# Patient Record
Sex: Female | Born: 1955
Health system: Southern US, Community
[De-identification: ages and names within clinical notes are randomized; demographics above are authoritative.]

## PROBLEM LIST (undated history)

## (undated) DIAGNOSIS — E785 Hyperlipidemia, unspecified: Secondary | ICD-10-CM

## (undated) DIAGNOSIS — J449 Chronic obstructive pulmonary disease, unspecified: Secondary | ICD-10-CM

## (undated) DIAGNOSIS — K2100 Gastro-esophageal reflux disease with esophagitis, without bleeding: Secondary | ICD-10-CM

## (undated) DIAGNOSIS — J45909 Unspecified asthma, uncomplicated: Secondary | ICD-10-CM

## (undated) DIAGNOSIS — M1990 Unspecified osteoarthritis, unspecified site: Secondary | ICD-10-CM

## (undated) DIAGNOSIS — R05 Cough: Secondary | ICD-10-CM

## (undated) DIAGNOSIS — F32A Depression, unspecified: Secondary | ICD-10-CM

## (undated) DIAGNOSIS — R059 Cough, unspecified: Secondary | ICD-10-CM

## (undated) DIAGNOSIS — M791 Myalgia, unspecified site: Secondary | ICD-10-CM

## (undated) DIAGNOSIS — K589 Irritable bowel syndrome without diarrhea: Secondary | ICD-10-CM

## (undated) DIAGNOSIS — F419 Anxiety disorder, unspecified: Secondary | ICD-10-CM

## (undated) DIAGNOSIS — E119 Type 2 diabetes mellitus without complications: Secondary | ICD-10-CM

## (undated) DIAGNOSIS — B009 Herpesviral infection, unspecified: Secondary | ICD-10-CM

## (undated) DIAGNOSIS — K219 Gastro-esophageal reflux disease without esophagitis: Secondary | ICD-10-CM

## (undated) DIAGNOSIS — J349 Unspecified disorder of nose and nasal sinuses: Secondary | ICD-10-CM

## (undated) DIAGNOSIS — G4730 Sleep apnea, unspecified: Secondary | ICD-10-CM

## (undated) DIAGNOSIS — E039 Hypothyroidism, unspecified: Secondary | ICD-10-CM

## (undated) DIAGNOSIS — E079 Disorder of thyroid, unspecified: Secondary | ICD-10-CM

## (undated) DIAGNOSIS — M545 Low back pain, unspecified: Secondary | ICD-10-CM

## (undated) DIAGNOSIS — J42 Unspecified chronic bronchitis: Secondary | ICD-10-CM

## (undated) DIAGNOSIS — F329 Major depressive disorder, single episode, unspecified: Secondary | ICD-10-CM

## (undated) HISTORY — PX: OOPHORECTOMY: SHX86

## (undated) HISTORY — DX: Irritable bowel syndrome, unspecified: K58.9

## (undated) HISTORY — DX: Gastro-esophageal reflux disease with esophagitis, without bleeding: K21.00

## (undated) HISTORY — DX: Gastro-esophageal reflux disease with esophagitis: K21.0

## (undated) HISTORY — DX: Low back pain: M54.5

## (undated) HISTORY — DX: Depression, unspecified: F32.A

## (undated) HISTORY — DX: Unspecified osteoarthritis, unspecified site: M19.90

## (undated) HISTORY — DX: Chronic obstructive pulmonary disease, unspecified: J44.9

## (undated) HISTORY — DX: Major depressive disorder, single episode, unspecified: F32.9

## (undated) HISTORY — DX: Cough, unspecified: R05.9

## (undated) HISTORY — DX: Low back pain, unspecified: M54.50

## (undated) HISTORY — PX: TONSILLECTOMY AND ADENOIDECTOMY: SUR1326

## (undated) HISTORY — DX: Hyperlipidemia, unspecified: E78.5

## (undated) HISTORY — DX: Disorder of thyroid, unspecified: E07.9

## (undated) HISTORY — DX: Unspecified disorder of nose and nasal sinuses: J34.9

## (undated) HISTORY — DX: Herpesviral infection, unspecified: B00.9

## (undated) HISTORY — PX: ABDOMINAL HYSTERECTOMY: SHX81

## (undated) HISTORY — DX: Myalgia, unspecified site: M79.10

## (undated) HISTORY — PX: CARPAL TUNNEL RELEASE: SHX101

## (undated) HISTORY — PX: SHOULDER ARTHROSCOPY W/ ROTATOR CUFF REPAIR: SHX2400

## (undated) HISTORY — DX: Anxiety disorder, unspecified: F41.9

## (undated) HISTORY — DX: Cough: R05

## (undated) HISTORY — PX: FRACTURE SURGERY: SHX138

## (undated) HISTORY — PX: COLON SURGERY: SHX602

---

## 2004-08-26 ENCOUNTER — Emergency Department: Payer: Self-pay | Admitting: Emergency Medicine

## 2008-05-23 ENCOUNTER — Ambulatory Visit: Payer: Self-pay | Admitting: General Practice

## 2009-05-29 ENCOUNTER — Ambulatory Visit: Payer: Self-pay | Admitting: General Practice

## 2010-02-21 ENCOUNTER — Ambulatory Visit: Payer: Self-pay | Admitting: Family Medicine

## 2010-05-22 ENCOUNTER — Ambulatory Visit: Payer: Self-pay | Admitting: General Practice

## 2010-09-09 ENCOUNTER — Ambulatory Visit: Payer: Self-pay | Admitting: Family Medicine

## 2010-11-23 ENCOUNTER — Ambulatory Visit: Payer: Self-pay

## 2010-12-31 ENCOUNTER — Ambulatory Visit: Payer: Self-pay | Admitting: Unknown Physician Specialty

## 2011-01-09 ENCOUNTER — Ambulatory Visit: Payer: Self-pay | Admitting: Unknown Physician Specialty

## 2011-05-21 ENCOUNTER — Ambulatory Visit: Payer: Self-pay

## 2011-06-11 ENCOUNTER — Ambulatory Visit: Payer: Self-pay

## 2012-03-26 ENCOUNTER — Ambulatory Visit: Payer: Self-pay | Admitting: Family Medicine

## 2012-05-19 ENCOUNTER — Ambulatory Visit: Payer: Self-pay

## 2012-09-29 ENCOUNTER — Ambulatory Visit: Payer: Self-pay | Admitting: General Practice

## 2013-05-24 ENCOUNTER — Ambulatory Visit (INDEPENDENT_AMBULATORY_CARE_PROVIDER_SITE_OTHER): Payer: PRIVATE HEALTH INSURANCE | Admitting: Podiatry

## 2013-05-24 ENCOUNTER — Encounter: Payer: Self-pay | Admitting: Podiatry

## 2013-05-24 ENCOUNTER — Ambulatory Visit (INDEPENDENT_AMBULATORY_CARE_PROVIDER_SITE_OTHER): Payer: PRIVATE HEALTH INSURANCE

## 2013-05-24 VITALS — BP 127/72 | HR 68 | Resp 16 | Ht 60.0 in | Wt 170.0 lb

## 2013-05-24 DIAGNOSIS — M722 Plantar fascial fibromatosis: Secondary | ICD-10-CM

## 2013-05-24 DIAGNOSIS — M79671 Pain in right foot: Secondary | ICD-10-CM

## 2013-05-24 DIAGNOSIS — M79609 Pain in unspecified limb: Secondary | ICD-10-CM

## 2013-05-24 DIAGNOSIS — M7750 Other enthesopathy of unspecified foot: Secondary | ICD-10-CM

## 2013-05-24 MED ORDER — MELOXICAM 15 MG PO TABS: 15.0000 mg | ORAL_TABLET | Freq: Every day | ORAL | Status: DC

## 2013-05-24 MED ORDER — TRIAMCINOLONE ACETONIDE 10 MG/ML IJ SUSP
5.0000 mg | Freq: Once | INTRAMUSCULAR | Status: AC
  Administered 2013-05-24: 5 mg via INTRA_ARTICULAR

## 2013-05-24 NOTE — Progress Notes (Signed)
Subjective:     Patient ID: Sara Andrews, female   DOB: 1955-11-15, 57 y.o.   MRN: [id number]  HPI patient presents stating I have had bad heel pain for the last month on the right heel in my left foot has been hurting me on the outside the last several weeks. Had a history of this in the past but at least 8 year   Review of Systems  All other systems reviewed and are negative.       Objective:   Physical Exam  Nursing note and vitals reviewed. Constitutional: She is oriented to person, place, and time.  Cardiovascular: Intact distal pulses.   Musculoskeletal: Normal range of motion.  Neurological: She is oriented to person, place, and time.  Skin: Skin is warm.   patient is found to have intense discomfort plantar aspect right heel at the insertion of the calcaneus and the fascia and on the left lateral foot it is. Tender around the peroneal brevis insertion     Assessment:     Plantar fasciitis right and tendinitis of the left lateral foot    Plan:     H&P done along with x-rays. Injected the right plantar fascia 3 mg Kenalog 5 mg Xylocaine Marcaine and injected the left lateral foot 3 mg Kenalog 5 mg Xylocaine Marcaine dispensed fascial brace right placed on Mobic 15 mg daily and reappoint one week

## 2013-05-24 NOTE — Progress Notes (Signed)
N HURT L B/L/ FOOT RIGHT HEEL / LEFT LATERAL D RT 4 WEEKS/ LEFT 1 WEEK O SUDDEN C WORSE/BETTER A STANDING T IBUPROFEN, ICE PACKS ORTHOTICS

## 2013-05-26 ENCOUNTER — Ambulatory Visit: Payer: Self-pay | Admitting: General Practice

## 2013-05-31 ENCOUNTER — Ambulatory Visit (INDEPENDENT_AMBULATORY_CARE_PROVIDER_SITE_OTHER): Payer: PRIVATE HEALTH INSURANCE | Admitting: Podiatry

## 2013-05-31 ENCOUNTER — Encounter: Payer: Self-pay | Admitting: Podiatry

## 2013-05-31 VITALS — BP 122/57 | HR 76 | Resp 16 | Ht 60.0 in | Wt 170.0 lb

## 2013-05-31 DIAGNOSIS — M779 Enthesopathy, unspecified: Secondary | ICD-10-CM

## 2013-05-31 DIAGNOSIS — M722 Plantar fascial fibromatosis: Secondary | ICD-10-CM

## 2013-05-31 MED ORDER — TRIAMCINOLONE ACETONIDE 10 MG/ML IJ SUSP
5.0000 mg | Freq: Once | INTRAMUSCULAR | Status: AC
  Administered 2013-05-31: 5 mg via INTRA_ARTICULAR

## 2013-05-31 NOTE — Progress Notes (Signed)
Subjective:     Patient ID: Sara Andrews, female   DOB: September 28, 1955, 57 y.o.   MRN: [id number]  HPI patient states I'm doing better but my left ankle has been sore. States the heels are improving   Review of Systems     Objective:   Physical Exam  Nursing note and vitals reviewed. Constitutional: She is oriented to person, place, and time.  Cardiovascular: Intact distal pulses.   Musculoskeletal: Normal range of motion.  Neurological: She is oriented to person, place, and time.  Skin: Skin is warm.   patient has discomfort in the sinus tarsi left with inflammation noted. Heels are improved upon palpation    Assessment:     Improved plantar fasciitis with sinus tarsitis noted left    Plan:     Advised on supportive shoe gear and injected the sinus tarsi left 3 mg Kenalog 5 mg Xylocaine Marcaine mixture. Reappoint her recheck as needed

## 2013-09-13 ENCOUNTER — Ambulatory Visit: Payer: Self-pay | Admitting: Cardiovascular Disease

## 2013-09-20 ENCOUNTER — Ambulatory Visit: Payer: Self-pay | Admitting: Cardiovascular Disease

## 2013-09-30 ENCOUNTER — Encounter: Payer: Self-pay | Admitting: Podiatry

## 2013-09-30 ENCOUNTER — Ambulatory Visit (INDEPENDENT_AMBULATORY_CARE_PROVIDER_SITE_OTHER): Payer: PRIVATE HEALTH INSURANCE | Admitting: Podiatry

## 2013-09-30 VITALS — BP 120/70 | HR 74 | Resp 16

## 2013-09-30 DIAGNOSIS — M722 Plantar fascial fibromatosis: Secondary | ICD-10-CM

## 2013-09-30 MED ORDER — TRIAMCINOLONE ACETONIDE 10 MG/ML IJ SUSP
10.0000 mg | Freq: Once | INTRAMUSCULAR | Status: AC
  Administered 2013-09-30: 10 mg

## 2013-09-30 NOTE — Progress Notes (Signed)
The right foot is just getting worse .

## 2013-09-30 NOTE — Progress Notes (Signed)
Subjective:     Patient ID: Sara Andrews, female   DOB: 12-Dec-1955, 58 y.o.   MRN: [id number]  HPI patient states my heel has started to kill me again on my right on Friday and I'm having trouble going up in the morning and being able to walk on this   Review of Systems     Objective:   Physical Exam Neurovascular status is found to be intact with normal range of motion and muscle strength and no equinus condition noted of both feet. Patient is found to have intense discomfort plantar heel right at the insertional point of the tendon into the calcaneus    Assessment:     Severe plantar fasciitis right foot with inflammation at the tendon insertion    Plan:     Injected the right fascia 3 mg Kenalog 5 mg Xylocaine Marcaine mixture and dispensed night splint with all instructions on usage continue oral anti-inflammatories and reappoint in 2 weeks

## 2013-10-05 ENCOUNTER — Ambulatory Visit (INDEPENDENT_AMBULATORY_CARE_PROVIDER_SITE_OTHER): Payer: PRIVATE HEALTH INSURANCE | Admitting: Cardiovascular Disease

## 2013-10-05 ENCOUNTER — Encounter: Payer: Self-pay | Admitting: Cardiovascular Disease

## 2013-10-05 VITALS — BP 110/84 | HR 74 | Ht 60.0 in | Wt 169.5 lb

## 2013-10-05 DIAGNOSIS — R079 Chest pain, unspecified: Secondary | ICD-10-CM | POA: Insufficient documentation

## 2013-10-05 DIAGNOSIS — E038 Other specified hypothyroidism: Secondary | ICD-10-CM | POA: Insufficient documentation

## 2013-10-05 DIAGNOSIS — E785 Hyperlipidemia, unspecified: Secondary | ICD-10-CM | POA: Insufficient documentation

## 2013-10-05 DIAGNOSIS — Z87891 Personal history of nicotine dependence: Secondary | ICD-10-CM

## 2013-10-05 DIAGNOSIS — J449 Chronic obstructive pulmonary disease, unspecified: Secondary | ICD-10-CM | POA: Insufficient documentation

## 2013-10-05 DIAGNOSIS — J4489 Other specified chronic obstructive pulmonary disease: Secondary | ICD-10-CM | POA: Insufficient documentation

## 2013-10-05 DIAGNOSIS — E039 Hypothyroidism, unspecified: Secondary | ICD-10-CM

## 2013-10-05 DIAGNOSIS — R0602 Shortness of breath: Secondary | ICD-10-CM

## 2013-10-05 NOTE — Progress Notes (Signed)
Patient ID: Sara Andrews, female    DOB: 04-21-1956, 58 y.o.   MRN: [id number]  HPI Comments: [name] is a pleasant 58 year old woman with history of COPD, diabetes type 2, hyperlipidemia, long history of smoking with chronic shortness of breath who presents by referral for symptoms of chest pain.  She reports that over the past several months she has had rare episodes of chest pain. Symptoms presenting on the left side, sometimes at rest, sometimes with stress. Symptoms last for only a short period of time. No reproducible pain with palpation. 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   Her biggest complaint is her shortness of breath. She has had this for a long time. She quit smoking 5 years ago but smoked for 35 years. She has never had any cardiac workup in the past. She is limited in her ability to exert herself secondary to shortness of breath. She has noticed this when doing yard work, symptoms started in particular last summer 2014  Total cholesterol 211, LDL 142, hemoglobin A1c 6.2. Recently started on generic Lipitor  EKG shows normal sinus rhythm with rate 74 beats per minute with no significant ST or T wave changes     Outpatient Encounter Prescriptions as of 10/05/2013  Medication Sig  . aspirin EC 81 MG tablet Take 81 mg by mouth daily.  Marland Kitchen. atorvastatin (LIPITOR) 20 MG tablet Take 20 mg by mouth daily.  . diclofenac (VOLTAREN) 75 MG EC tablet Take 75 mg by mouth 2 (two) times daily.  Marland Kitchen. levothyroxine (SYNTHROID, LEVOTHROID) 88 MCG tablet Take 88 mcg by mouth daily before breakfast.  . omeprazole (PRILOSEC) 40 MG capsule Take 40 mg by mouth daily.  . sertraline (ZOLOFT) 100 MG tablet Take 100 mg by mouth daily.  Marland Kitchen. tiotropium (SPIRIVA) 18 MCG inhalation capsule Place 18 mcg into inhaler and inhale as needed.     Review of Systems  Constitutional: Negative.   HENT: Negative.   Eyes: Negative.   Respiratory: Positive  for chest tightness and shortness of breath.   Cardiovascular: Positive for chest pain.  Gastrointestinal: Negative.   Endocrine: Negative.   Musculoskeletal: Negative.   Skin: Negative.   Allergic/Immunologic: Negative.   Neurological: Negative.   Hematological: Negative.   Psychiatric/Behavioral: Negative.   All other systems reviewed and are negative.    BP 110/84  Pulse 74  Ht 5' (1.524 m)  Wt 169 lb 8 oz (76.885 kg)  BMI 33.10 kg/m2  Physical Exam  Nursing note and vitals reviewed. Constitutional: She is oriented to person, place, and time. She appears well-developed and well-nourished.  HENT:  Head: Normocephalic.  Nose: Nose normal.  Mouth/Throat: Oropharynx is clear and moist.  Eyes: Conjunctivae are normal. Pupils are equal, round, and reactive to light.  Neck: Normal range of motion. Neck supple. No JVD present.  Cardiovascular: Normal rate, regular rhythm, S1 normal, S2 normal, normal heart sounds and intact distal pulses.  Exam reveals no gallop and no friction rub.   No murmur heard. Pulmonary/Chest: Effort normal and breath sounds normal. No respiratory distress. She has no wheezes. She has no rales. She exhibits no tenderness.  Abdominal: Soft. Bowel sounds are normal. She exhibits no distension. There is no tenderness.  Musculoskeletal: Normal range of motion. She exhibits no edema and no tenderness.  Lymphadenopathy:    She has no cervical adenopathy.  Neurological: She is alert and oriented to person, place, and time. Coordination normal.  Skin: Skin is warm and dry. No rash noted. No erythema.  Psychiatric: She has a normal mood and affect. Her behavior is normal. Judgment and thought content normal.    Assessment and Plan

## 2013-10-05 NOTE — Assessment & Plan Note (Addendum)
Recently started on generic Lipitor.

## 2013-10-05 NOTE — Assessment & Plan Note (Signed)
Etiology of her shortness of breath is most likely from COPD and deconditioning though unable to exclude ischemia. After long discussion with her about various options, we will proceed with lexiscan  Myoview. She would be unable to walk on a treadmill given her significant shortness of breath

## 2013-10-05 NOTE — Patient Instructions (Addendum)
You are doing well. No medication changes were made.  We will schedule a lexiscan myoview for shortness of breath We will call you with the results  Please call us if you have new issues that need to be addressed before your next appt.    [facility] MYOVIEW  Your caregiver has ordered a Stress Test with nuclear imaging. The purpose of this test is to evaluate the blood supply to your heart muscle. This procedure is referred to as a "Non-Invasive Stress Test." This is because other than having an IV started in your vein, nothing is inserted or "invades" your body. Cardiac stress tests are done to find areas of poor blood flow to the heart by determining the extent of coronary artery disease (CAD). Some patients exercise on a treadmill, which naturally increases the blood flow to your heart, while others who are  unable to walk on a treadmill due to physical limitations have a pharmacologic/chemical stress agent called Lexiscan . This medicine will mimic walking on a treadmill by temporarily increasing your coronary blood flow.   Please note: these test may take anywhere between 2-4 hours to complete  [facility]  THE VOLUNTEERS AT THE FIRST DESK WILL DIRECT YOU WHERE TO GO  Date of Procedure:______Friday, March 27_________________  Arrival Time for Procedure:_______7:15am__________________  PLEASE NOTIFY THE OFFICE AT LEAST 24 HOURS IN ADVANCE IF YOU ARE UNABLE TO KEEP YOUR APPOINTMENT.  [phone] AND  PLEASE NOTIFY [facility] AT LEAST 24 HOURS IN ADVANCE IF YOU ARE UNABLE TO KEEP YOUR APPOINTMENT. [phone]  How to prepare for your Myoview test:  1. Do not eat or drink after midnight 2. No caffeine for 24 hours prior to test 3. No smoking 24 hours prior to test. 4. Your medication may be taken with water.  If your doctor stopped a medication because of this test, do not take that medication. 5. Ladies, please do not wear dresses.  Skirts  or pants are appropriate. Please wear a short sleeve shirt. 6. No perfume, cologne or lotion. 7. Wear comfortable walking shoes. No heels!

## 2013-10-05 NOTE — Assessment & Plan Note (Signed)
Atypical chest pain symptoms, often at rest, very rare. Stress test has been ordered given her significant shortness of breath with exertion.

## 2013-10-05 NOTE — Assessment & Plan Note (Signed)
Long history of smoking for 35 years. She does take albuterol periodically for symptoms. Suspect this could be contributing to her shortness of breath symptoms as well as deconditioning.

## 2013-10-14 ENCOUNTER — Ambulatory Visit (INDEPENDENT_AMBULATORY_CARE_PROVIDER_SITE_OTHER): Payer: PRIVATE HEALTH INSURANCE | Admitting: Podiatry

## 2013-10-14 VITALS — BP 103/64 | HR 76 | Resp 16 | Ht 60.0 in | Wt 169.0 lb

## 2013-10-14 DIAGNOSIS — M722 Plantar fascial fibromatosis: Secondary | ICD-10-CM

## 2013-10-14 MED ORDER — DIAZEPAM 5 MG PO TABS: 5.0000 mg | ORAL_TABLET | Freq: Two times a day (BID) | ORAL | Status: DC | PRN

## 2013-10-14 NOTE — Progress Notes (Signed)
Subjective:     Patient ID: Sara Andrews, female   DOB: 1955-08-17, 58 y.o.   MRN: [id number]  HPI patient states she is still having trouble with her right heel when she is on it a lot but it is improved went off it or when getting up   Review of Systems     Objective:   Physical Exam Neurovascular intact with discomfort of a mild/moderate nature plantar right heel that it's worse with cement floors    Assessment:     Plantar fasciitis right still present    Plan:     Advised on physical therapy continued boot usage and scanned for custom orthotics to reduce the stress against the plantar arch and to try to prevent her from needing surgery

## 2013-10-21 ENCOUNTER — Ambulatory Visit: Payer: Self-pay | Admitting: Cardiovascular Disease

## 2013-10-21 ENCOUNTER — Encounter: Payer: Self-pay | Admitting: Cardiovascular Disease

## 2013-10-21 DIAGNOSIS — R079 Chest pain, unspecified: Secondary | ICD-10-CM

## 2013-10-26 ENCOUNTER — Encounter: Payer: Self-pay | Admitting: *Deleted

## 2013-10-26 NOTE — Progress Notes (Signed)
Sent pt post card letting her know orthotics are here. 

## 2013-11-22 ENCOUNTER — Institutional Professional Consult (permissible substitution): Payer: PRIVATE HEALTH INSURANCE | Admitting: Internal Medicine

## 2013-12-02 ENCOUNTER — Encounter: Payer: Self-pay | Admitting: Pulmonary Disease

## 2013-12-05 ENCOUNTER — Institutional Professional Consult (permissible substitution): Payer: PRIVATE HEALTH INSURANCE | Admitting: Pulmonary Disease

## 2013-12-06 ENCOUNTER — Institutional Professional Consult (permissible substitution): Payer: PRIVATE HEALTH INSURANCE | Admitting: Pulmonary Disease

## 2013-12-16 ENCOUNTER — Ambulatory Visit (INDEPENDENT_AMBULATORY_CARE_PROVIDER_SITE_OTHER): Payer: PRIVATE HEALTH INSURANCE | Admitting: Podiatry

## 2013-12-16 ENCOUNTER — Encounter: Payer: Self-pay | Admitting: Podiatry

## 2013-12-16 VITALS — BP 118/68 | HR 72 | Resp 16

## 2013-12-16 DIAGNOSIS — B351 Tinea unguium: Secondary | ICD-10-CM

## 2013-12-16 DIAGNOSIS — M722 Plantar fascial fibromatosis: Secondary | ICD-10-CM

## 2013-12-16 NOTE — Progress Notes (Signed)
Subjective:     Patient ID: Sara Andrews, female   DOB: 05-27-1956, 58 y.o.   MRN: [id number]  HPI patient presents stating that her right heel is improved but she does have discoloration of the left big toenail but she was concerned about   Review of Systems     Objective:   Physical Exam Neurovascular status intact with mild discomfort in the plantar heel right that is improved but still present and yellow discoloration of the big toenail left    Assessment:     Improving plantar fasciitis with nail disease of the left hallux probably due to trauma    Plan:     H&P performed condition explained and at this time will begin topical antifungal for the left nail. Begin orthotic usage and of any heel issues should occur reappoint

## 2014-05-23 ENCOUNTER — Ambulatory Visit: Payer: Self-pay | Admitting: General Practice

## 2014-06-07 ENCOUNTER — Ambulatory Visit: Payer: Self-pay | Admitting: Family Medicine

## 2015-01-04 ENCOUNTER — Other Ambulatory Visit: Payer: Self-pay | Admitting: Family Medicine

## 2015-01-24 ENCOUNTER — Encounter (INDEPENDENT_AMBULATORY_CARE_PROVIDER_SITE_OTHER): Payer: Self-pay

## 2015-01-24 ENCOUNTER — Encounter: Payer: Self-pay | Admitting: Family Medicine

## 2015-01-24 ENCOUNTER — Ambulatory Visit (INDEPENDENT_AMBULATORY_CARE_PROVIDER_SITE_OTHER): Payer: PRIVATE HEALTH INSURANCE | Admitting: Family Medicine

## 2015-01-24 VITALS — BP 136/84 | HR 77 | Temp 97.5°F | Resp 16 | Wt 169.2 lb

## 2015-01-24 DIAGNOSIS — M1990 Unspecified osteoarthritis, unspecified site: Secondary | ICD-10-CM

## 2015-01-24 DIAGNOSIS — E785 Hyperlipidemia, unspecified: Secondary | ICD-10-CM | POA: Diagnosis not present

## 2015-01-24 DIAGNOSIS — F329 Major depressive disorder, single episode, unspecified: Secondary | ICD-10-CM | POA: Diagnosis not present

## 2015-01-24 DIAGNOSIS — K219 Gastro-esophageal reflux disease without esophagitis: Secondary | ICD-10-CM | POA: Diagnosis not present

## 2015-01-24 DIAGNOSIS — F32A Depression, unspecified: Secondary | ICD-10-CM

## 2015-01-24 DIAGNOSIS — Z87891 Personal history of nicotine dependence: Secondary | ICD-10-CM

## 2015-01-24 DIAGNOSIS — Z720 Tobacco use: Secondary | ICD-10-CM

## 2015-01-24 DIAGNOSIS — J410 Simple chronic bronchitis: Secondary | ICD-10-CM

## 2015-01-24 MED ORDER — OMEPRAZOLE 40 MG PO CPDR: 40.0000 mg | DELAYED_RELEASE_CAPSULE | Freq: Every day | ORAL | Status: DC

## 2015-01-24 MED ORDER — SERTRALINE HCL 100 MG PO TABS
100.0000 mg | ORAL_TABLET | Freq: Two times a day (BID) | ORAL | Status: DC
Start: 2015-01-24 — End: 2015-09-11

## 2015-01-24 MED ORDER — DICLOFENAC SODIUM 1 % TD GEL: 2.0000 g | Freq: Four times a day (QID) | TRANSDERMAL | Status: DC

## 2015-01-24 NOTE — Progress Notes (Signed)
Name: Sara Andrews   MRN: [id number]    DOB: 06-13-1956   Date:01/24/2015       Progress Note  Subjective  Chief Complaint  Chief Complaint  Patient presents with  . Hyperlipidemia  . Hyperthyroidism  . Tick Removal    bite on right side still irritated  . COPD    Hyperlipidemia This is a chronic problem. The current episode started more than 1 year ago. The problem is controlled. Recent lipid tests were reviewed and are normal. Exacerbating diseases include hypothyroidism and obesity. Factors aggravating her hyperlipidemia include fatty foods. Associated symptoms include shortness of breath. Pertinent negatives include no chest pain, focal weakness or myalgias. Current antihyperlipidemic treatment includes statins. The current treatment provides moderate improvement of lipids. There are no compliance problems.  Risk factors for coronary artery disease include dyslipidemia, hypertension, post-menopausal, a sedentary lifestyle and stress.  Thyroid Problem Presents for follow-up visit. Symptoms include cold intolerance, depressed mood and fatigue. Patient reports no anxiety, constipation, diarrhea, palpitations, tremors or weight loss. The symptoms have been stable. Past treatments include levothyroxine. The treatment provided moderate relief. Her past medical history is significant for hyperlipidemia.  Breathing Problem She complains of cough, difficulty breathing, shortness of breath, sputum production and wheezing. There is no hemoptysis. This is a chronic problem. The current episode started more than 1 year ago. The problem occurs daily. The problem has been unchanged. The cough is non-productive. Associated symptoms include heartburn. Pertinent negatives include no chest pain, fever, headaches, myalgias, sore throat or weight loss. Her symptoms are aggravated by exposure to fumes and exposure to smoke. She reports moderate improvement on treatment. Risk factors for lung disease include  smoking/tobacco exposure. Her past medical history is significant for COPD.  Arthritis Presents for follow-up visit. She complains of pain, stiffness and joint swelling. Affected locations include the right PIP, left PIP, left DIP and right DIP. Associated symptoms include fatigue. Pertinent negatives include no diarrhea, dysuria, fever or weight loss. Her pertinent risk factors include overuse. Past treatments include NSAIDs. The treatment provided mild relief. Factors aggravating her arthritis include gripping. Compliance with prior treatments has been good.  Gastrophageal Reflux She complains of belching, coughing, heartburn and wheezing. She reports no chest pain, no nausea or no sore throat. This is a chronic problem. The current episode started more than 1 year ago. The problem occurs frequently. The problem has been unchanged. The symptoms are aggravated by caffeine and certain foods. Associated symptoms include fatigue. Pertinent negatives include no weight loss. Risk factors include caffeine use and lack of exercise. She has tried a PPI for the symptoms. The treatment provided mild relief.   Depression  Patient is currently on sertraline 50 mg 3 times a day. She's had a number of other family and work stressors and feels that her mood is diminished. Wishes to increase her regimen. There's been no suicidal ideations or thoughts   Past Medical History  Diagnosis Date  . Depression   . Cough   . Diabetes   . Muscle pain   . Sinus disorder   . COPD (chronic obstructive pulmonary disease)   . Anxiety   . Uncomplicated herpes simplex   . Esophagitis, reflux   . Lumbago   . Hyperlipidemia   . Thyroid disease   . IBS (irritable bowel syndrome)   . Osteoarthritis     History  Substance Use Topics  . Smoking status: Former Smoker -- 2.00 packs/day for 30 years    Quit date:  09/03/2008  . Smokeless tobacco: Never Used  . Alcohol Use: No     Current outpatient prescriptions:  .   aspirin EC 81 MG tablet, Take 81 mg by mouth daily., Disp: , Rfl:  .  atorvastatin (LIPITOR) 20 MG tablet, Take 20 mg by mouth daily., Disp: , Rfl:  .  diazepam (VALIUM) 5 MG tablet, Take 1 tablet (5 mg total) by mouth every 12 (twelve) hours as needed for anxiety., Disp: 30 tablet, Rfl: 1 .  diclofenac (VOLTAREN) 75 MG EC tablet, Take 75 mg by mouth 2 (two) times daily., Disp: , Rfl:  .  levothyroxine (SYNTHROID, LEVOTHROID) 88 MCG tablet, Take 88 mcg by mouth daily before breakfast., Disp: , Rfl:  .  meloxicam (MOBIC) 7.5 MG tablet, Take 7.5 mg by mouth daily., Disp: , Rfl:  .  montelukast (SINGULAIR) 10 MG tablet, Take 10 mg by mouth at bedtime., Disp: , Rfl:  .  omeprazole (PRILOSEC) 40 MG capsule, Take 40 mg by mouth daily., Disp: , Rfl:  .  sertraline (ZOLOFT) 100 MG tablet, Take 100 mg by mouth daily., Disp: , Rfl:  .  tiotropium (SPIRIVA) 18 MCG inhalation capsule, Place 18 mcg into inhaler and inhale as needed., Disp: , Rfl:  .  Vitamin D, Ergocalciferol, (DRISDOL) 50000 UNITS CAPS capsule, TAKE ONE CAPSULE BY MOUTH ONCE A MONTH FOR 3 MONTHS, Disp: 3 capsule, Rfl: 0  No Known Allergies  Review of Systems  Constitutional: Positive for fatigue. Negative for fever, chills and weight loss.  HENT: Negative for congestion, hearing loss, sore throat and tinnitus.   Eyes: Negative for blurred vision, double vision and redness.  Respiratory: Positive for cough, sputum production, shortness of breath and wheezing. Negative for hemoptysis.   Cardiovascular: Negative for chest pain, palpitations, orthopnea, claudication and leg swelling.  Gastrointestinal: Positive for heartburn. Negative for nausea, vomiting, diarrhea, constipation and blood in stool.  Genitourinary: Negative for dysuria, urgency, frequency and hematuria.  Musculoskeletal: Positive for joint pain, joint swelling, arthritis and stiffness. Negative for myalgias, back pain and neck pain.  Skin: Negative for itching.  Neurological:  Negative for dizziness, tingling, tremors, focal weakness, seizures, loss of consciousness, weakness and headaches.  Endo/Heme/Allergies: Positive for cold intolerance. Does not bruise/bleed easily.  Psychiatric/Behavioral: Positive for depression. Negative for substance abuse. The patient is not nervous/anxious and does not have insomnia.      Objective  Filed Vitals:   01/24/15 0742  BP: 136/84  Pulse: 77  Temp: 97.5 F (36.4 C)  Resp: 16  Weight: 169 lb 3 oz (76.743 kg)  SpO2: 93%     Physical Exam  Constitutional: She is oriented to person, place, and time and well-developed, well-nourished, and in no distress.  HENT:  Head: Normocephalic.  Eyes: EOM are normal. Pupils are equal, round, and reactive to light.  Neck: Normal range of motion. No thyromegaly present.  Cardiovascular: Normal rate, regular rhythm and normal heart sounds.   No murmur heard. Pulmonary/Chest: Breath sounds normal.  Abdominal: Soft. Bowel sounds are normal.  Musculoskeletal: Normal range of motion. She exhibits no edema.  Neurological: She is alert and oriented to person, place, and time. No cranial nerve deficit. Gait normal.  Skin: Skin is warm and dry. No rash noted.  Psychiatric: Memory and affect normal.  Depressed mood      Assessment & Plan  1. Simple chronic bronchitis Stable - montelukast (SINGULAIR) 10 MG tablet; Take 10 mg by mouth at bedtime.  2. History of smoking Discontinued  3. Hyperlipidemia Check lipid panel  4. Arthritis Worsening - meloxicam (MOBIC) 7.5 MG tablet; Take 7.5 mg by mouth daily. - diclofenac sodium (VOLTAREN) 1 % GEL; Apply 2 g topically 4 (four) times daily.  Dispense: 2 Tube; Refill: 2  5. Depression Worsening - sertraline (ZOLOFT) 100 MG tablet; Take 1 tablet (100 mg total) by mouth 2 (two) times daily.  Dispense: 60 tablet; Refill: 5  6. Gastroesophageal reflux disease without esophagitis Stable - omeprazole (PRILOSEC) 40 MG capsule; Take 1  capsule (40 mg total) by mouth daily.  Dispense: 90 capsule; Refill: 3

## 2015-01-24 NOTE — Patient Instructions (Signed)
F/U 4 MO 

## 2015-02-06 ENCOUNTER — Encounter: Payer: Self-pay | Admitting: Family Medicine

## 2015-03-08 ENCOUNTER — Encounter (INDEPENDENT_AMBULATORY_CARE_PROVIDER_SITE_OTHER): Payer: Self-pay

## 2015-03-08 ENCOUNTER — Ambulatory Visit (INDEPENDENT_AMBULATORY_CARE_PROVIDER_SITE_OTHER): Payer: PRIVATE HEALTH INSURANCE | Admitting: Family Medicine

## 2015-03-08 ENCOUNTER — Encounter: Payer: Self-pay | Admitting: Family Medicine

## 2015-03-08 VITALS — BP 128/80 | HR 91 | Temp 97.6°F | Resp 16 | Ht 60.0 in | Wt 171.1 lb

## 2015-03-08 DIAGNOSIS — J441 Chronic obstructive pulmonary disease with (acute) exacerbation: Secondary | ICD-10-CM

## 2015-03-08 DIAGNOSIS — J449 Chronic obstructive pulmonary disease, unspecified: Secondary | ICD-10-CM | POA: Diagnosis not present

## 2015-03-08 DIAGNOSIS — J45901 Unspecified asthma with (acute) exacerbation: Principal | ICD-10-CM

## 2015-03-08 MED ORDER — ALBUTEROL SULFATE (2.5 MG/3ML) 0.083% IN NEBU: 2.5000 mg | INHALATION_SOLUTION | Freq: Four times a day (QID) | RESPIRATORY_TRACT | Status: DC | PRN

## 2015-03-08 MED ORDER — PREDNISONE 10 MG (21) PO TBPK: 20.0000 mg | ORAL_TABLET | Freq: Every day | ORAL | Status: DC

## 2015-03-08 MED ORDER — HYDROCOD POLST-CPM POLST ER 10-8 MG/5ML PO SUER: 5.0000 mL | Freq: Two times a day (BID) | ORAL | Status: DC | PRN

## 2015-03-08 MED ORDER — DOXYCYCLINE HYCLATE 100 MG PO CAPS: 100.0000 mg | ORAL_CAPSULE | Freq: Two times a day (BID) | ORAL | Status: DC

## 2015-03-08 MED ORDER — BECLOMETHASONE DIPROPIONATE 80 MCG/ACT IN AERS: 1.0000 | INHALATION_SPRAY | Freq: Two times a day (BID) | RESPIRATORY_TRACT | Status: DC

## 2015-03-08 NOTE — Progress Notes (Signed)
Name: Sara Andrews   MRN: [id number]    DOB: Feb 23, 1956   Date:03/08/2015       Progress Note  Subjective  Chief Complaint  Chief Complaint  Patient presents with  . Asthma    pt has been SOB for 2 weeks,has taken a course of steriods but has not helped    Asthma She complains of cough, difficulty breathing, shortness of breath and sputum production. There is no hemoptysis. This is a chronic problem. The current episode started 1 to 4 weeks ago. The problem occurs constantly. The problem has been unchanged. The cough is productive of sputum. Associated symptoms include malaise/fatigue, nasal congestion, postnasal drip and rhinorrhea. Pertinent negatives include no chest pain, fever, headaches, heartburn, myalgias, sore throat or weight loss. Her symptoms are alleviated by nothing. Her symptoms are not alleviated by ipratropium and oral steroids. Her past medical history is significant for asthma.  Breathing Problem She complains of cough, difficulty breathing, shortness of breath and sputum production. There is no hemoptysis. Associated symptoms include malaise/fatigue, nasal congestion, postnasal drip and rhinorrhea. Pertinent negatives include no chest pain, fever, headaches, heartburn, myalgias, sore throat or weight loss. Her symptoms are aggravated by change in weather. Her symptoms are alleviated by nothing. Her past medical history is significant for asthma.      Past Medical History  Diagnosis Date  . Depression   . Cough   . Diabetes   . Muscle pain   . Sinus disorder   . COPD (chronic obstructive pulmonary disease)   . Anxiety   . Uncomplicated herpes simplex   . Esophagitis, reflux   . Lumbago   . Hyperlipidemia   . Thyroid disease   . IBS (irritable bowel syndrome)   . Osteoarthritis     Social History  Substance Use Topics  . Smoking status: Former Smoker -- 2.00 packs/day for 30 years    Quit date: 09/03/2008  . Smokeless tobacco: Never Used  . Alcohol  Use: No     Current outpatient prescriptions:  .  aspirin EC 81 MG tablet, Take 81 mg by mouth daily., Disp: , Rfl:  .  atorvastatin (LIPITOR) 20 MG tablet, Take 20 mg by mouth daily., Disp: , Rfl:  .  diazepam (VALIUM) 5 MG tablet, Take 1 tablet (5 mg total) by mouth every 12 (twelve) hours as needed for anxiety., Disp: 30 tablet, Rfl: 1 .  diclofenac (VOLTAREN) 75 MG EC tablet, Take 75 mg by mouth 2 (two) times daily., Disp: , Rfl:  .  diclofenac sodium (VOLTAREN) 1 % GEL, Apply 2 g topically 4 (four) times daily., Disp: 2 Tube, Rfl: 2 .  levothyroxine (SYNTHROID, LEVOTHROID) 88 MCG tablet, Take 88 mcg by mouth daily before breakfast., Disp: , Rfl:  .  meloxicam (MOBIC) 7.5 MG tablet, Take 7.5 mg by mouth daily., Disp: , Rfl:  .  montelukast (SINGULAIR) 10 MG tablet, Take 10 mg by mouth at bedtime., Disp: , Rfl:  .  omeprazole (PRILOSEC) 40 MG capsule, Take 1 capsule (40 mg total) by mouth daily., Disp: 90 capsule, Rfl: 3 .  sertraline (ZOLOFT) 100 MG tablet, Take 1 tablet (100 mg total) by mouth 2 (two) times daily., Disp: 60 tablet, Rfl: 5 .  tiotropium (SPIRIVA) 18 MCG inhalation capsule, Place 18 mcg into inhaler and inhale as needed., Disp: , Rfl:  .  Vitamin D, Ergocalciferol, (DRISDOL) 50000 UNITS CAPS capsule, TAKE ONE CAPSULE BY MOUTH ONCE A MONTH FOR 3 MONTHS, Disp: 3 capsule, Rfl: 0  No Known Allergies  Review of Systems  Constitutional: Positive for malaise/fatigue. Negative for fever, chills and weight loss.  HENT: Positive for congestion, postnasal drip and rhinorrhea. Negative for hearing loss, sore throat and tinnitus.   Eyes: Negative for blurred vision, double vision and redness.  Respiratory: Positive for cough, sputum production and shortness of breath. Negative for hemoptysis.        Diminished breath sounds with hyperresonance.  Cardiovascular: Negative for chest pain, palpitations, orthopnea, claudication and leg swelling.  Gastrointestinal: Negative for heartburn,  nausea, vomiting, diarrhea, constipation and blood in stool.  Genitourinary: Negative for dysuria, urgency, frequency and hematuria.  Musculoskeletal: Negative for myalgias, back pain, joint pain, falls and neck pain.  Skin: Negative for itching.  Neurological: Positive for weakness. Negative for dizziness, tingling, tremors, focal weakness, seizures, loss of consciousness and headaches.  Endo/Heme/Allergies: Does not bruise/bleed easily.  Psychiatric/Behavioral: Negative for depression and substance abuse. The patient is not nervous/anxious and does not have insomnia.      Objective  Filed Vitals:   03/08/15 0859  BP: 128/80  Pulse: 91  Temp: 97.6 F (36.4 C)  Resp: 16  Height: 5' (1.524 m)  Weight: 171 lb 1 oz (77.593 kg)  SpO2: 97%     Physical Exam  Constitutional: She is oriented to person, place, and time and well-developed, well-nourished, and in no distress.  HENT:  Head: Normocephalic.  Bilateral nasal turbinate congestion and erythema  Eyes: EOM are normal. Pupils are equal, round, and reactive to light.  Neck: Normal range of motion. No thyromegaly present.  Cardiovascular: Normal rate, regular rhythm and normal heart sounds.   No murmur heard. Pulmonary/Chest:  Markedly diminished breath sounds bilaterally with hyperresonance. Minimal expiratory wheezes  Abdominal: Soft. Bowel sounds are normal.  Musculoskeletal: Normal range of motion. She exhibits no edema (SLIGHTLY INCREASED respiratory effort. Markedly diminished breath sounds throughout hyperresonance to percussion.).  Neurological: She is alert and oriented to person, place, and time. No cranial nerve deficit. Gait normal.  Skin: Skin is warm and dry. No rash noted.  Psychiatric: Memory and affect normal.      Assessment & Plan  1. Acute exacerbation of COPD with asthma  - doxycycline (VIBRAMYCIN) 100 MG capsule; Take 1 capsule (100 mg total) by mouth 2 (two) times daily.  Dispense: 20 capsule;  Refill: 0 - predniSONE (STERAPRED UNI-PAK 21 TAB) 10 MG (21) TBPK tablet; Take 2 tablets (20 mg total) by mouth daily.  Dispense: 21 tablet; Refill: 0 - chlorpheniramine-HYDROcodone (TUSSIONEX PENNKINETIC ER) 10-8 MG/5ML SUER; Take 5 mLs by mouth every 12 (twelve) hours as needed for cough.  Dispense: 140 mL; Refill: 0

## 2015-03-13 ENCOUNTER — Other Ambulatory Visit: Payer: Self-pay | Admitting: Family Medicine

## 2015-03-15 ENCOUNTER — Other Ambulatory Visit: Payer: Self-pay | Admitting: Family Medicine

## 2015-03-22 ENCOUNTER — Ambulatory Visit: Payer: PRIVATE HEALTH INSURANCE | Admitting: Family Medicine

## 2015-03-25 ENCOUNTER — Other Ambulatory Visit: Payer: Self-pay | Admitting: Family Medicine

## 2015-04-09 ENCOUNTER — Ambulatory Visit (INDEPENDENT_AMBULATORY_CARE_PROVIDER_SITE_OTHER): Payer: PRIVATE HEALTH INSURANCE | Admitting: Family Medicine

## 2015-04-09 ENCOUNTER — Encounter: Payer: Self-pay | Admitting: Family Medicine

## 2015-04-09 VITALS — BP 110/70 | HR 95 | Temp 98.4°F | Resp 16 | Ht 60.0 in | Wt 172.4 lb

## 2015-04-09 DIAGNOSIS — G44039 Episodic paroxysmal hemicrania, not intractable: Secondary | ICD-10-CM | POA: Diagnosis not present

## 2015-04-09 DIAGNOSIS — R509 Fever, unspecified: Secondary | ICD-10-CM

## 2015-04-09 DIAGNOSIS — J441 Chronic obstructive pulmonary disease with (acute) exacerbation: Secondary | ICD-10-CM

## 2015-04-09 DIAGNOSIS — M2550 Pain in unspecified joint: Secondary | ICD-10-CM

## 2015-04-09 MED ORDER — HYDROCODONE-ACETAMINOPHEN 5-325 MG PO TABS: 1.0000 | ORAL_TABLET | Freq: Four times a day (QID) | ORAL | Status: DC | PRN

## 2015-04-09 MED ORDER — ONDANSETRON HCL 4 MG PO TABS: 4.0000 mg | ORAL_TABLET | Freq: Three times a day (TID) | ORAL | Status: DC | PRN

## 2015-04-09 MED ORDER — DOXYCYCLINE HYCLATE 100 MG PO CAPS: 100.0000 mg | ORAL_CAPSULE | Freq: Two times a day (BID) | ORAL | Status: DC

## 2015-04-09 NOTE — Progress Notes (Signed)
Name: Sara Andrews   MRN: [id number]    DOB: 1955/11/21   Date:04/09/2015       Progress Note  Subjective  Chief Complaint  Chief Complaint  Patient presents with  . Sinusitis    HPI  Sinusitis  Patient presents with greater than 7 day history of nasal congestion and drainage which is purulent in color. There is tenderness over the sinuses. There has been fever to unknown along with some associated chills on occasion. Usage of over-the-counter medications is not been affected. There is also accompanying cough productive of purulent sputum.  Acute exacerbation of COPD  Patient presents with a several-day history of exacerbation of COPD. There is increase sputum production which is purulent in color. There have been some chills associated and fevers as well. There is additional wheezing and cough. Usual medications are not working as effectively. Patient does recognize most improvement in treatment with Vibramycin antibiotic and accompanying oral steroids. `  Headache and joint pain  Patient presents with a 2 or more week history of headache and chronic area. There is also some photophobia. There has been some mild fever and chills and joint pains as well. She denies any rash and emphatically. There's been no definite history of tick bite her description. However she is living in an endemic area for [place] cannot spotted fever and Lyme's disease. She never has had either to her recollection.  Past Medical History  Diagnosis Date  . Depression   . Cough   . Diabetes   . Muscle pain   . Sinus disorder   . COPD (chronic obstructive pulmonary disease)   . Anxiety   . Uncomplicated herpes simplex   . Esophagitis, reflux   . Lumbago   . Hyperlipidemia   . Thyroid disease   . IBS (irritable bowel syndrome)   . Osteoarthritis     Social History  Substance Use Topics  . Smoking status: Former Smoker -- 2.00 packs/day for 30 years    Quit date: 09/03/2008  . Smokeless tobacco:  Never Used  . Alcohol Use: No     Current outpatient prescriptions:  .  albuterol (PROVENTIL) (2.5 MG/3ML) 0.083% nebulizer solution, Take 3 mLs (2.5 mg total) by nebulization every 6 (six) hours as needed for wheezing or shortness of breath., Disp: 75 mL, Rfl: 12 .  aspirin 81 MG EC tablet, TAKE 1 TABLET BY MOUTH EVERY DAY, Disp: 30 tablet, Rfl: 11 .  atorvastatin (LIPITOR) 20 MG tablet, Take 20 mg by mouth daily., Disp: , Rfl:  .  beclomethasone (QVAR) 80 MCG/ACT inhaler, Inhale 1 puff into the lungs 2 (two) times daily at 10 AM and 5 PM., Disp: 1 Inhaler, Rfl: 12 .  diazepam (VALIUM) 5 MG tablet, Take 1 tablet (5 mg total) by mouth every 12 (twelve) hours as needed for anxiety., Disp: 30 tablet, Rfl: 1 .  diclofenac (VOLTAREN) 75 MG EC tablet, Take 75 mg by mouth 2 (two) times daily., Disp: , Rfl:  .  diclofenac sodium (VOLTAREN) 1 % GEL, Apply 2 g topically 4 (four) times daily., Disp: 2 Tube, Rfl: 2 .  levothyroxine (SYNTHROID, LEVOTHROID) 88 MCG tablet, Take 88 mcg by mouth daily before breakfast., Disp: , Rfl:  .  meloxicam (MOBIC) 7.5 MG tablet, Take 7.5 mg by mouth daily., Disp: , Rfl:  .  montelukast (SINGULAIR) 10 MG tablet, Take 10 mg by mouth at bedtime., Disp: , Rfl:  .  omeprazole (PRILOSEC) 40 MG capsule, Take 1 capsule (40 mg  total) by mouth daily., Disp: 90 capsule, Rfl: 3 .  sertraline (ZOLOFT) 100 MG tablet, Take 1 tablet (100 mg total) by mouth 2 (two) times daily., Disp: 60 tablet, Rfl: 5 .  tiotropium (SPIRIVA) 18 MCG inhalation capsule, Place 18 mcg into inhaler and inhale as needed., Disp: , Rfl:   No Known Allergies  Review of Systems  Constitutional: Positive for fever, chills and malaise/fatigue. Negative for weight loss.  HENT: Positive for congestion. Negative for hearing loss, sore throat and tinnitus.   Eyes: Positive for photophobia. Negative for blurred vision, double vision and redness.  Respiratory: Positive for cough, sputum production, shortness of  breath and wheezing. Negative for hemoptysis.   Cardiovascular: Negative for chest pain, palpitations, orthopnea, claudication and leg swelling.  Gastrointestinal: Negative.  Negative for heartburn, nausea, vomiting, diarrhea, constipation and blood in stool.  Genitourinary: Negative.  Negative for dysuria, urgency, frequency and hematuria.  Musculoskeletal: Positive for back pain, joint pain and neck pain. Negative for myalgias and falls.  Skin: Negative for itching and rash.  Neurological: Positive for weakness and headaches. Negative for dizziness, tingling, tremors, focal weakness, seizures and loss of consciousness.  Endo/Heme/Allergies: Does not bruise/bleed easily.  Psychiatric/Behavioral: Positive for depression. Negative for substance abuse. The patient is not nervous/anxious and does not have insomnia.      Objective  Filed Vitals:   04/09/15 1446  BP: 110/70  Pulse: 95  Temp: 98.4 F (36.9 C)  TempSrc: Oral  Resp: 16  Height: 5' (1.524 m)  Weight: 172 lb 6.4 oz (78.2 kg)  SpO2: 95%     Physical Exam  Constitutional: She is oriented to person, place, and time and well-developed, well-nourished, and in no distress.  HENT:  Head: Normocephalic.  Erythematous nasal turbinates with clear discharge  Eyes: EOM are normal. Pupils are equal, round, and reactive to light.  Neck: Normal range of motion. No thyromegaly present.  Cardiovascular: Normal rate, regular rhythm and normal heart sounds.   No murmur heard. Pulmonary/Chest:  Diminished breath sounds throughout with increased percussion and no wheezes or rhonchi currently  Abdominal: Soft. Bowel sounds are normal.  Musculoskeletal: She exhibits no edema.  Arthritic changes are noted in the hands and joints of the hand which are on change there is no synovitis or warmth or erythema appreciated our  Neurological: She is alert and oriented to person, place, and time. No cranial nerve deficit. Gait normal.  Skin: Skin is  warm and dry. No rash noted.  Psychiatric: Memory and affect normal.      Assessment & Plan   1. Nonintractable paroxysmal hemicrania, unspecified chronicity pattern Rule out [facility] spotted fever and Lyme disease they are endemic here - [place] spotted fvr ab, IgG-blood - Lyme Disease, Western Blot Vibramycin 100 mg twice a day for 14 days to cover for [facility] my spotted fever and Lyme disease 2. Fever and chills Rule out [facility] spotted fever and Lyme disease they are endemic here - [place] spotted fvr ab, IgG-blood - Lyme Disease, Western Blot  3. Joint pain Rule out [facility] spotted fever and Lyme disease they are endemic here - [place] spotted fvr ab, IgG-blood - Lyme Disease, Western Blot  4. Acute exacerbation of chronic obstructive pulmonary disease (COPD) Symptomatic treatment for now for cough and she is only already on Vibramycin - HYDROcodone-acetaminophen (NORCO) 5-325 MG per tablet; Take 1 tablet by mouth every 6 (six) hours as needed for moderate pain.  Dispense: 30 tablet; Refill:  0      

## 2015-04-11 ENCOUNTER — Other Ambulatory Visit: Payer: Self-pay | Admitting: Family Medicine

## 2015-04-11 ENCOUNTER — Ambulatory Visit: Payer: PRIVATE HEALTH INSURANCE | Admitting: Family Medicine

## 2015-04-11 LAB — ROCKY MTN SPOTTED FVR AB, IGG-BLOOD: RMSF IGG: NEGATIVE

## 2015-04-11 LAB — LYME DISEASE, WESTERN BLOT
IGG P18 AB.: ABSENT
IGG P23 AB.: ABSENT
IGG P30 AB.: ABSENT
IGG P39 AB.: ABSENT
IGG P41 AB.: ABSENT
IGG P66 AB.: ABSENT
IGG P93 AB.: ABSENT
IGM P39 AB.: ABSENT
IGM P41 AB.: ABSENT
IgG P28 Ab.: ABSENT
IgG P45 Ab.: ABSENT
IgG P58 Ab.: ABSENT
IgM P23 Ab.: ABSENT
Lyme IgG Wb: NEGATIVE
Lyme IgM Wb: NEGATIVE

## 2015-04-13 ENCOUNTER — Telehealth: Payer: Self-pay | Admitting: Emergency Medicine

## 2015-04-13 NOTE — Telephone Encounter (Signed)
Patient notified

## 2015-04-16 ENCOUNTER — Other Ambulatory Visit: Payer: Self-pay | Admitting: Family Medicine

## 2015-04-24 ENCOUNTER — Other Ambulatory Visit: Payer: Self-pay

## 2015-04-30 ENCOUNTER — Encounter: Payer: Self-pay | Admitting: Family Medicine

## 2015-04-30 ENCOUNTER — Ambulatory Visit (INDEPENDENT_AMBULATORY_CARE_PROVIDER_SITE_OTHER): Payer: PRIVATE HEALTH INSURANCE | Admitting: Family Medicine

## 2015-04-30 VITALS — BP 110/60 | HR 96 | Temp 98.2°F | Resp 16 | Ht 60.0 in | Wt 171.1 lb

## 2015-04-30 DIAGNOSIS — R509 Fever, unspecified: Secondary | ICD-10-CM

## 2015-04-30 DIAGNOSIS — E785 Hyperlipidemia, unspecified: Secondary | ICD-10-CM | POA: Diagnosis not present

## 2015-04-30 DIAGNOSIS — J42 Unspecified chronic bronchitis: Secondary | ICD-10-CM

## 2015-04-30 DIAGNOSIS — M1990 Unspecified osteoarthritis, unspecified site: Secondary | ICD-10-CM

## 2015-04-30 MED ORDER — MELOXICAM 15 MG PO TABS: 15.0000 mg | ORAL_TABLET | Freq: Every day | ORAL | Status: DC

## 2015-04-30 MED ORDER — MAGNESIUM OXIDE 400 MG PO CAPS: 1.0000 | ORAL_CAPSULE | Freq: Once | ORAL | Status: DC

## 2015-04-30 MED ORDER — SIMVASTATIN 40 MG PO TABS: 40.0000 mg | ORAL_TABLET | Freq: Every day | ORAL | Status: DC

## 2015-04-30 NOTE — Progress Notes (Signed)
Name: Sara Andrews   MRN: [id number]    DOB: 21-Apr-1956   Date:04/30/2015       Progress Note  Subjective  Chief Complaint  Chief Complaint  Patient presents with  . Insect Bite    3 week follow up  . Hyperlipidemia    Cholesterol at work 268  . Shoulder Pain    right for 2 days    HPI  Follow-up with insect bite  Patient had a tick bite several weeks ago. She had myalgias arthralgias low-grade fever and headache but no rash. She was treated empirically with Vibramycin. Lyme disease titers were obtained but were negative at that time. She does not particularly wish to have it repeated that she is feeling better at this time.  COPD  Patient is using her inhaler available regular basis. She has minimal a.m. cough which is sometimes productive. No recent fever or chills. Sputum is clear. There is minimal wheezing.  Arthritis and tendinitis  Complaint of diffuse joint pain and tenderness along the bicipital tendons. She does repetitive work at her job.    Past Medical History  Diagnosis Date  . Depression   . Cough   . Diabetes (HCC)   . Muscle pain   . Sinus disorder   . COPD (chronic obstructive pulmonary disease) (HCC)   . Anxiety   . Uncomplicated herpes simplex   . Esophagitis, reflux   . Lumbago   . Hyperlipidemia   . Thyroid disease   . IBS (irritable bowel syndrome)   . Osteoarthritis     Social History  Substance Use Topics  . Smoking status: Former Smoker -- 2.00 packs/day for 30 years    Quit date: 09/03/2008  . Smokeless tobacco: Never Used  . Alcohol Use: No     Current outpatient prescriptions:  .  acyclovir (ZOVIRAX) 400 MG tablet, TAKE 1 TABLET BY MOUTH 3 TIMES A DAY FOR 2 DAYS FOR FEVER BLISTERS, Disp: 30 tablet, Rfl: 1 .  albuterol (PROVENTIL) (2.5 MG/3ML) 0.083% nebulizer solution, Take 3 mLs (2.5 mg total) by nebulization every 6 (six) hours as needed for wheezing or shortness of breath., Disp: 75 mL, Rfl: 12 .  aspirin 81 MG EC  tablet, TAKE 1 TABLET BY MOUTH EVERY DAY, Disp: 30 tablet, Rfl: 11 .  atorvastatin (LIPITOR) 20 MG tablet, Take 20 mg by mouth daily., Disp: , Rfl:  .  beclomethasone (QVAR) 80 MCG/ACT inhaler, Inhale 1 puff into the lungs 2 (two) times daily at 10 AM and 5 PM., Disp: 1 Inhaler, Rfl: 12 .  diazepam (VALIUM) 5 MG tablet, Take 1 tablet (5 mg total) by mouth every 12 (twelve) hours as needed for anxiety., Disp: 30 tablet, Rfl: 1 .  diclofenac (VOLTAREN) 75 MG EC tablet, Take 75 mg by mouth 2 (two) times daily., Disp: , Rfl:  .  levothyroxine (SYNTHROID, LEVOTHROID) 88 MCG tablet, Take 88 mcg by mouth daily before breakfast., Disp: , Rfl:  .  meloxicam (MOBIC) 7.5 MG tablet, Take 7.5 mg by mouth daily., Disp: , Rfl:  .  montelukast (SINGULAIR) 10 MG tablet, TAKE 1 TABLET BY MOUTH EVERY DAY, Disp: 30 tablet, Rfl: 7 .  omeprazole (PRILOSEC) 40 MG capsule, Take 1 capsule (40 mg total) by mouth daily., Disp: 90 capsule, Rfl: 3 .  sertraline (ZOLOFT) 100 MG tablet, Take 1 tablet (100 mg total) by mouth 2 (two) times daily., Disp: 60 tablet, Rfl: 5 .  tiotropium (SPIRIVA) 18 MCG inhalation capsule, Place 18 mcg into  inhaler and inhale as needed., Disp: , Rfl:   No Known Allergies  Review of Systems  Constitutional: Positive for malaise/fatigue. Negative for fever, chills and weight loss.  HENT: Negative for congestion, hearing loss, sore throat and tinnitus.   Eyes: Negative for blurred vision, double vision and redness.  Respiratory: Positive for cough, sputum production and wheezing. Negative for hemoptysis and shortness of breath.   Cardiovascular: Negative for chest pain, palpitations, orthopnea, claudication and leg swelling.  Gastrointestinal: Negative for heartburn, nausea, vomiting, diarrhea, constipation and blood in stool.  Genitourinary: Negative for dysuria, urgency, frequency and hematuria.  Musculoskeletal: Positive for back pain, joint pain and neck pain. Negative for myalgias and falls.   Skin: Negative for itching and rash.  Neurological: Positive for weakness. Negative for dizziness, tingling, tremors, focal weakness, seizures, loss of consciousness and headaches.  Endo/Heme/Allergies: Does not bruise/bleed easily.  Psychiatric/Behavioral: Positive for depression. Negative for substance abuse. The patient is not nervous/anxious and does not have insomnia.      Objective  Filed Vitals:   04/30/15 1421  BP: 110/60  Pulse: 96  Temp: 98.2 F (36.8 C)  TempSrc: Oral  Resp: 16  Height: 5' (1.524 m)  Weight: 171 lb 1.6 oz (77.61 kg)  SpO2: 95%     Physical Exam  Constitutional: She is oriented to person, place, and time and well-developed, well-nourished, and in no distress.  Obese female in no acute distress. She has more obtained expressive and sputum and swelling at times on today. This is an improvement over last visit  HENT:  Head: Normocephalic.  Eyes: EOM are normal. Pupils are equal, round, and reactive to light.  Neck: Normal range of motion. No thyromegaly present.  Cardiovascular: Normal rate, regular rhythm and normal heart sounds.   No murmur heard. Pulmonary/Chest:  Diminished breath sounds throughout with hyperresonance to percussion.  Abdominal: Soft. Bowel sounds are normal.  Musculoskeletal: She exhibits no edema.  Diffuse osteoarthritic changes with bicipital tendinitis on the right as well  Neurological: She is alert and oriented to person, place, and time. No cranial nerve deficit. Gait normal.  Skin: Skin is warm and dry. No rash noted.  Psychiatric: Memory and affect normal.  Less depressed appearing today      Assessment & Plan  1. Fever, unspecified fever cause Adequately treated with a course of Vibramycin that would've covered) moderate spotted fever and Lyme's disease. - [place] spotted fvr ab, IgG-blood - Lyme Disease, Western Blot  2. Hyperlipidemia Lipid panel at work when she is restarted on her statin  3.  Arthritis Mobic 15 mg once daily  4. Chronic bronchitis, unspecified chronic bronchitis type (HCC) Continue inhalers

## 2015-05-01 ENCOUNTER — Encounter: Payer: Self-pay | Admitting: Family Medicine

## 2015-05-09 ENCOUNTER — Other Ambulatory Visit: Payer: Self-pay | Admitting: Family Medicine

## 2015-05-09 DIAGNOSIS — Z1231 Encounter for screening mammogram for malignant neoplasm of breast: Secondary | ICD-10-CM

## 2015-05-15 ENCOUNTER — Ambulatory Visit
Admission: RE | Admit: 2015-05-15 | Discharge: 2015-05-15 | Disposition: A | Discharge status: Home / Self Care | Payer: No Typology Code available for payment source | Source: Ambulatory Visit | Attending: Family Medicine | Admitting: Family Medicine

## 2015-05-15 DIAGNOSIS — Z1231 Encounter for screening mammogram for malignant neoplasm of breast: Secondary | ICD-10-CM | POA: Diagnosis present

## 2015-06-26 ENCOUNTER — Ambulatory Visit (INDEPENDENT_AMBULATORY_CARE_PROVIDER_SITE_OTHER): Payer: PRIVATE HEALTH INSURANCE | Admitting: Family Medicine

## 2015-06-26 ENCOUNTER — Encounter: Payer: Self-pay | Admitting: Family Medicine

## 2015-06-26 VITALS — BP 102/78 | HR 97 | Temp 98.5°F | Resp 16 | Ht 60.0 in | Wt 161.7 lb

## 2015-06-26 DIAGNOSIS — B349 Viral infection, unspecified: Secondary | ICD-10-CM

## 2015-06-26 DIAGNOSIS — Z23 Encounter for immunization: Secondary | ICD-10-CM | POA: Diagnosis not present

## 2015-06-26 DIAGNOSIS — J441 Chronic obstructive pulmonary disease with (acute) exacerbation: Secondary | ICD-10-CM

## 2015-06-26 MED ORDER — PREDNISONE 20 MG PO TABS: 20.0000 mg | ORAL_TABLET | Freq: Two times a day (BID) | ORAL | Status: DC

## 2015-06-26 MED ORDER — HYDROCOD POLST-CPM POLST ER 10-8 MG/5ML PO SUER: 5.0000 mL | Freq: Two times a day (BID) | ORAL | Status: DC | PRN

## 2015-06-26 NOTE — Progress Notes (Signed)
Name: Sara Andrews   MRN: [id number]    DOB: 11-17-1955   Date:06/26/2015       Progress Note  Subjective  Chief Complaint  Chief Complaint  Patient presents with  . URI    Onset 5 days and worsening. Symptoms include coughing,sob, wheezing, congestion.    HPI  Viral illness: acute onset of rhinorrhea, cough, body aches, headache four days ago as she leaving work.  She states she also had a 102 temperature yesterday. She denies taking any antepyretic medication today and is afebrile.  It may have been the flu, but she is 4 days out and without a fever, so we will not give her Tamiflu or check for it today. Advised otc medication for symptom control  COPD exacerbation: started after viral illness. States SOB and cough  is worse than usual, also coughing a brown sputum, fever yesterday, feeling very tired. Using neb therapy at home also compliant with Spiriva. She had some Tussionex left at home and needs a refill. She missed work yesterday and today.    Patient Active Problem List   Diagnosis Date Noted  . SOB (shortness of breath) 10/05/2013  . COPD (chronic obstructive pulmonary disease) (HCC) 10/05/2013  . History of smoking 10/05/2013  . Hyperlipidemia 10/05/2013  . Hypothyroidism 10/05/2013    Past Surgical History  Procedure Laterality Date  . Abdominal hysterectomy    . Shoulder surgery Right   . Hand surgery Right   . Tonsillectomy and adenoidectomy      Family History  Problem Relation Age of Onset  . Hypertension Daughter   . Breast cancer Maternal Aunt 60  . Breast cancer Paternal Aunt 460    Social History   Social History  . Marital Status: Married    Spouse Name: N/A  . Number of Children: N/A  . Years of Education: N/A   Occupational History  . Not on file.   Social History Main Topics  . Smoking status: Former Smoker -- 2.00 packs/day for 30 years    Quit date: 09/03/2008  . Smokeless tobacco: Never Used  . Alcohol Use: No  . Drug Use:  No  . Sexual Activity: Not on file   Other Topics Concern  . Not on file   Social History Narrative     Current outpatient prescriptions:  .  acyclovir (ZOVIRAX) 400 MG tablet, TAKE 1 TABLET BY MOUTH 3 TIMES A DAY FOR 2 DAYS FOR FEVER BLISTERS, Disp: 30 tablet, Rfl: 1 .  albuterol (PROVENTIL) (2.5 MG/3ML) 0.083% nebulizer solution, Take 3 mLs (2.5 mg total) by nebulization every 6 (six) hours as needed for wheezing or shortness of breath., Disp: 75 mL, Rfl: 12 .  aspirin 81 MG EC tablet, TAKE 1 TABLET BY MOUTH EVERY DAY, Disp: 30 tablet, Rfl: 11 .  beclomethasone (QVAR) 80 MCG/ACT inhaler, Inhale 1 puff into the lungs 2 (two) times daily at 10 AM and 5 PM., Disp: 1 Inhaler, Rfl: 12 .  diclofenac (VOLTAREN) 75 MG EC tablet, Take 75 mg by mouth 2 (two) times daily., Disp: , Rfl:  .  levothyroxine (SYNTHROID, LEVOTHROID) 88 MCG tablet, Take 88 mcg by mouth daily before breakfast., Disp: , Rfl:  .  meloxicam (MOBIC) 15 MG tablet, Take 1 tablet (15 mg total) by mouth daily., Disp: 30 tablet, Rfl: 5 .  montelukast (SINGULAIR) 10 MG tablet, TAKE 1 TABLET BY MOUTH EVERY DAY, Disp: 30 tablet, Rfl: 7 .  omeprazole (PRILOSEC) 40 MG capsule, Take 1 capsule (40  mg total) by mouth daily., Disp: 90 capsule, Rfl: 3 .  sertraline (ZOLOFT) 100 MG tablet, Take 1 tablet (100 mg total) by mouth 2 (two) times daily., Disp: 60 tablet, Rfl: 5 .  simvastatin (ZOCOR) 40 MG tablet, Take 1 tablet (40 mg total) by mouth at bedtime., Disp: 30 tablet, Rfl: 5 .  tiotropium (SPIRIVA) 18 MCG inhalation capsule, Place 18 mcg into inhaler and inhale as needed., Disp: , Rfl:  .  chlorpheniramine-HYDROcodone (TUSSIONEX PENNKINETIC ER) 10-8 MG/5ML SUER, Take 5 mLs by mouth every 12 (twelve) hours as needed for cough., Disp: 140 mL, Rfl: 0 .  diazepam (VALIUM) 5 MG tablet, Take 1 tablet (5 mg total) by mouth every 12 (twelve) hours as needed for anxiety. (Patient not taking: Reported on 06/26/2015), Disp: 30 tablet, Rfl: 1 .   Magnesium Oxide 400 MG CAPS, Take 1 capsule (400 mg total) by mouth once. (Patient not taking: Reported on 06/26/2015), Disp: 30 capsule, Rfl: 5 .  predniSONE (DELTASONE) 20 MG tablet, Take 1 tablet (20 mg total) by mouth 2 (two) times daily with a meal., Disp: 10 tablet, Rfl: 0  No Known Allergies   ROS  Ten systems reviewed and is negative except as mentioned in HPI, and also decrease in appetite  Objective  Filed Vitals:   06/26/15 1038  BP: 102/78  Pulse: 97  Temp: 98.5 F (36.9 C)  TempSrc: Oral  Resp: 16  Height: 5' (1.524 m)  Weight: 161 lb 11.2 oz (73.347 kg)  SpO2: 96%    Body mass index is 31.58 kg/(m^2).  Physical Exam  Constitutional: Patient appears well-developed and well-nourished.No distress.  HEENT: head atraumatic, normocephalic, pupils equal and reactive to light, ears TM normal bilaterally, neck supple, throat within normal limits Cardiovascular: Normal rate, regular rhythm and normal heart sounds.  No murmur heard. No BLE edema. Pulmonary/Chest: Effort normal she has end expiratory wheezing bilaterally, no rhonchi or crackles Abdominal: Soft.  There is no tenderness. Psychiatric: Patient has a normal mood and affect. behavior is normal. Judgment and thought content normal.  Recent Results (from the past 2160 hour(s))  Rocky mtn spotted fvr ab, IgG-blood     Status: None   Collection Time: 04/09/15  3:26 PM  Result Value Ref Range   RMSF IgG Negative Negative  Lyme Disease, Western Blot     Status: None   Collection Time: 04/09/15  3:26 PM  Result Value Ref Range     IgG P93 Ab. Absent      IgG P66 Ab. Absent      IgG P58 Ab. Absent      IgG P45 Ab. Absent      IgG P41 Ab. Absent      IgG P39 Ab. Absent      IgG P30 Ab. Absent      IgG P28 Ab. Absent      IgG P23 Ab. Absent      IgG P18 Ab. Absent    Lyme IgG Wb Negative     Comment:                      Positive: 5 of the following                                Borrelia-specific bands:  18,23,28,30,39,41,45,58,                                66, and 93.                      Negative: No bands or banding                                patterns which do not                                meet positive criteria.      IgM P41 Ab. Absent      IgM P39 Ab. Absent      IgM P23 Ab. Absent    Lyme IgM Wb Negative     Comment: Note: An equivocal or positive EIA result followed by a negative Western Blot result is considered NEGATIVE. An equivocal or positive EIA result followed by a positive Western Blot is considered POSITIVE by the CDC. Positive: 2 of the following bands: 23,39 or 41 Negative: No bands or banding patterns which do not meet positive criteria. Criteria for positivity are those recommended by CDC/ASTPHLD. p23=Osp C, p41=flagellin Note: Sera from individuals with the following may cross react in the Lyme Western Blot assays: other spirochetal diseases (periodontal disease, leptospirosis, relapsing fever, yaws, and pinta); connective autoimmune (Rheumatoid Arthritis and Systemic Lupus Erythematosus and also individuals with Antinuclear Antibody); other infections [facility] Spotted Fever; Epstein-Barr Virus, and Cytomegalovirus).      PHQ2/9: Depression screen [facility] 2/9 04/30/2015 03/08/2015 01/24/2015  Decreased Interest 0 0 0  Down, Depressed, Hopeless 0 0 0  PHQ - 2 Score 0 0 0    Fall Risk: Fall Risk  06/26/2015 04/30/2015 04/09/2015 03/08/2015 01/24/2015  Falls in the past year? No No No No No    Functional Status Survey: Is the patient deaf or have difficulty hearing?: No Does the patient have difficulty seeing, even when wearing glasses/contacts?: No Does the patient have difficulty concentrating, remembering, or making decisions?: No Does the patient have difficulty walking or climbing stairs?: No Does the patient have difficulty dressing or bathing?: No Does the patient have difficulty doing errands alone such as  visiting a doctor's office or shopping?: No    Assessment & Plan  1. COPD exacerbation (HCC)  - predniSONE (DELTASONE) 20 MG tablet; Take 1 tablet (20 mg total) by mouth 2 (two) times daily with a meal.  Dispense: 10 tablet; Refill: 0 - chlorpheniramine-HYDROcodone (TUSSIONEX PENNKINETIC ER) 10-8 MG/5ML SUER; Take 5 mLs by mouth every 12 (twelve) hours as needed for cough.  Dispense: 140 mL; Refill: 0  2. Viral illness  Take otc medication    3. Need for pneumococcal vaccination  - Pneumococcal polysaccharide vaccine 23-valent greater than or equal to 2yo subcutaneous/IM

## 2015-07-06 ENCOUNTER — Other Ambulatory Visit: Payer: Self-pay | Admitting: Family Medicine

## 2015-09-03 ENCOUNTER — Ambulatory Visit: Payer: PRIVATE HEALTH INSURANCE | Admitting: Family Medicine

## 2015-09-04 ENCOUNTER — Telehealth: Payer: Self-pay | Admitting: Family Medicine

## 2015-09-04 NOTE — Telephone Encounter (Signed)
Sara Andrews from [employer] is requesting a return call. She would like to know exactly which labs you are wanting to be drawn for this patient before her next appointment. In September they did an executive panel on the patient.  (P) [phone] (F) [phone]

## 2015-09-04 NOTE — Telephone Encounter (Signed)
Called number 3 times and it is the wrong number.

## 2015-09-07 ENCOUNTER — Other Ambulatory Visit: Payer: Self-pay | Admitting: Family Medicine

## 2015-09-08 LAB — CMP12+LP+TP+TSH+6AC+CBC/D/PLT
A/G RATIO: 2.2 (ref 1.1–2.5)
ALBUMIN: 4.2 g/dL (ref 3.6–4.8)
ALK PHOS: 72 IU/L (ref 39–117)
ALT: 13 IU/L (ref 0–32)
AST: 16 IU/L (ref 0–40)
BASOS ABS: 0 10*3/uL (ref 0.0–0.2)
BASOS: 0 %
BILIRUBIN TOTAL: 0.2 mg/dL (ref 0.0–1.2)
BUN / CREAT RATIO: 18 (ref 11–26)
BUN: 13 mg/dL (ref 8–27)
CHLORIDE: 103 mmol/L (ref 96–106)
CHOLESTEROL TOTAL: 156 mg/dL (ref 100–199)
Calcium: 8.9 mg/dL (ref 8.7–10.3)
Chol/HDL Ratio: 2.7 ratio units (ref 0.0–4.4)
Creatinine, Ser: 0.74 mg/dL (ref 0.57–1.00)
EOS (ABSOLUTE): 0.1 10*3/uL (ref 0.0–0.4)
EOS: 3 %
FREE THYROXINE INDEX: 2.2 (ref 1.2–4.9)
GFR calc Af Amer: 101 mL/min/{1.73_m2} (ref >59)
GFR calc non Af Amer: 88 mL/min/{1.73_m2} (ref >59)
GGT: 14 IU/L (ref 0–60)
Globulin, Total: 1.9 g/dL (ref 1.5–4.5)
Glucose: 94 mg/dL (ref 65–99)
HDL: 57 mg/dL (ref >39)
HEMATOCRIT: 39.2 % (ref 34.0–46.6)
HEMOGLOBIN: 12.7 g/dL (ref 11.1–15.9)
IMMATURE GRANS (ABS): 0 10*3/uL (ref 0.0–0.1)
IMMATURE GRANULOCYTES: 0 %
IRON: 58 ug/dL (ref 27–139)
LDH: 165 IU/L (ref 119–226)
LDL CALC: 88 mg/dL (ref 0–99)
LYMPHS ABS: 1.6 10*3/uL (ref 0.7–3.1)
LYMPHS: 33 %
MCH: 28.7 pg (ref 26.6–33.0)
MCHC: 32.4 g/dL (ref 31.5–35.7)
MCV: 89 fL (ref 79–97)
Monocytes Absolute: 0.3 10*3/uL (ref 0.1–0.9)
Monocytes: 6 %
NEUTROS PCT: 58 %
Neutrophils Absolute: 2.9 10*3/uL (ref 1.4–7.0)
PLATELETS: 237 10*3/uL (ref 150–379)
Phosphorus: 4 mg/dL (ref 2.5–4.5)
Potassium: 4.7 mmol/L (ref 3.5–5.2)
RBC: 4.42 x10E6/uL (ref 3.77–5.28)
RDW: 14.5 % (ref 12.3–15.4)
Sodium: 141 mmol/L (ref 134–144)
T3 UPTAKE RATIO: 26 % (ref 24–39)
T4, Total: 8.5 ug/dL (ref 4.5–12.0)
TOTAL PROTEIN: 6.1 g/dL (ref 6.0–8.5)
TRIGLYCERIDES: 55 mg/dL (ref 0–149)
TSH: 0.887 u[IU]/mL (ref 0.450–4.500)
Uric Acid: 3 mg/dL (ref 2.5–7.1)
VLDL CHOLESTEROL CAL: 11 mg/dL (ref 5–40)
WBC: 5 10*3/uL (ref 3.4–10.8)

## 2015-09-08 LAB — HGB A1C W/O EAG: Hgb A1c MFr Bld: 6 % — ABNORMAL HIGH (ref 4.8–5.6)

## 2015-09-11 ENCOUNTER — Other Ambulatory Visit: Payer: Self-pay | Admitting: Family Medicine

## 2015-09-11 DIAGNOSIS — F329 Major depressive disorder, single episode, unspecified: Secondary | ICD-10-CM

## 2015-09-11 DIAGNOSIS — F32A Depression, unspecified: Secondary | ICD-10-CM

## 2015-09-11 MED ORDER — SERTRALINE HCL 100 MG PO TABS: 100.0000 mg | ORAL_TABLET | Freq: Two times a day (BID) | ORAL | Status: DC

## 2015-09-12 ENCOUNTER — Other Ambulatory Visit: Payer: Self-pay | Admitting: Family Medicine

## 2015-09-14 ENCOUNTER — Ambulatory Visit: Payer: PRIVATE HEALTH INSURANCE | Admitting: Family Medicine

## 2015-09-14 ENCOUNTER — Other Ambulatory Visit: Payer: Self-pay | Admitting: Family Medicine

## 2015-09-14 MED ORDER — SERTRALINE HCL 100 MG PO TABS: 100.0000 mg | ORAL_TABLET | Freq: Two times a day (BID) | ORAL | Status: DC

## 2015-09-18 ENCOUNTER — Ambulatory Visit (INDEPENDENT_AMBULATORY_CARE_PROVIDER_SITE_OTHER): Payer: PRIVATE HEALTH INSURANCE | Admitting: Family Medicine

## 2015-09-18 ENCOUNTER — Encounter: Payer: Self-pay | Admitting: Family Medicine

## 2015-09-18 VITALS — BP 118/82 | HR 86 | Temp 98.5°F | Resp 19 | Wt 158.8 lb

## 2015-09-18 DIAGNOSIS — J441 Chronic obstructive pulmonary disease with (acute) exacerbation: Secondary | ICD-10-CM

## 2015-09-18 DIAGNOSIS — J069 Acute upper respiratory infection, unspecified: Secondary | ICD-10-CM | POA: Diagnosis not present

## 2015-09-18 MED ORDER — PREDNISONE 20 MG PO TABS: 20.0000 mg | ORAL_TABLET | Freq: Two times a day (BID) | ORAL | Status: DC

## 2015-09-18 MED ORDER — HYDROCOD POLST-CPM POLST ER 10-8 MG/5ML PO SUER: 5.0000 mL | Freq: Two times a day (BID) | ORAL | Status: DC | PRN

## 2015-09-18 NOTE — Patient Instructions (Signed)
Complete Amoxicillin. Add Mucinex or Robitussin.

## 2015-09-18 NOTE — Progress Notes (Addendum)
Subjective:     Patient ID: Sara Andrews, female   DOB: Aug 09, 1955, 60 y.o.   MRN: [id number]  HPI  Chief Complaint  Patient presents with  . URI    Patient reports that she was seen by her PCP a week ago and diagnosed with URI, patient was prescribed Amoxicillin and Fluticasone nasal spray. Patient states that her symptoms appeared to clear but on Saturday she developed a productive cough of thick white muscous and fever high of 100.9. Patient reports due to cough she has had shortness of breath and bilateral flank pain, patient does have a reported hsitory of Asthma and COPD.   States she developed cold symptoms 2/13 and went to see her company health care provider on 2/14. Reports compliance with Combivent and is using her albuterol nebulizer twice a day. Her ribs are sore from coughing.   Review of Systems     Objective:   Physical Exam  Constitutional: She appears well-developed and well-nourished. She has a sickly appearance.  Ears: T.M's intact without inflammation Throat: tonsils absent, no posterior pharyngeal erythema Neck: no cervical adenopathy Lungs: clear     Assessment:    1. Upper respiratory infection - chlorpheniramine-HYDROcodone (TUSSIONEX PENNKINETIC ER) 10-8 MG/5ML SUER; Take 5 mLs by mouth every 12 (twelve) hours as needed for cough.  Dispense: 120 mL; Refill: 0  2. COPD exacerbation (HCC) - predniSONE (DELTASONE) 20 MG tablet; Take 1 tablet (20 mg total) by mouth 2 (two) times daily with a meal.  Dispense: 10 tablet; Refill: 0    Plan:    Complete Amoxicillin and add expectorant. Work excuse for 2/20-2/22.

## 2015-09-18 NOTE — Addendum Note (Signed)
Addended by: Jaclyn Prime on: 09/18/2015 09:06 AM   Modules accepted: Kipp Brood

## 2015-09-19 ENCOUNTER — Telehealth: Payer: Self-pay | Admitting: Family Medicine

## 2015-09-19 NOTE — Telephone Encounter (Signed)
This is a pt from [employer] but saw Nadine Counts 09/18/15 for OV. Pt stated that she would like her work note to be extended for her to return to work Monday 09/24/15. Pt stated that she is still coughing and feeling fatigue. Pt stated she doesn't feel that she can return to work tomorrow. I advised that Nadine Counts was out of the office this afternoon. Thanks TNP

## 2015-09-19 NOTE — Telephone Encounter (Signed)
Please review

## 2015-09-20 NOTE — Telephone Encounter (Signed)
Work excuse up front for pickup. 

## 2015-09-20 NOTE — Telephone Encounter (Signed)
Rodney Booze with [employer] called and stated patient called there to request a work excuse, and for Sara Andrews to change her antibiotics. Oneita Kras that patient's work excuse is at the front desk for pick up. Discussed changing patient's antibiotic with Sara Andrews. Per Sara Andrews patient was just prescribed medication on the 2/21 so she has not been taking medication long enough. Rodney Booze states she will advise patient of discussion.

## 2015-09-27 ENCOUNTER — Ambulatory Visit: Payer: PRIVATE HEALTH INSURANCE | Admitting: Family Medicine

## 2015-10-30 ENCOUNTER — Other Ambulatory Visit: Payer: Self-pay

## 2015-10-30 MED ORDER — LEVOTHYROXINE SODIUM 88 MCG PO TABS: 88.0000 ug | ORAL_TABLET | Freq: Every day | ORAL | Status: DC

## 2015-10-30 MED ORDER — SIMVASTATIN 40 MG PO TABS: 40.0000 mg | ORAL_TABLET | Freq: Every day | ORAL | Status: DC

## 2015-10-31 ENCOUNTER — Ambulatory Visit: Payer: PRIVATE HEALTH INSURANCE | Admitting: Family Medicine

## 2015-12-09 ENCOUNTER — Other Ambulatory Visit: Payer: Self-pay | Admitting: Family Medicine

## 2015-12-11 ENCOUNTER — Other Ambulatory Visit: Payer: Self-pay | Admitting: Family Medicine

## 2015-12-11 NOTE — Telephone Encounter (Signed)
Due to insurance this prescription need to be a 90day supply

## 2015-12-28 ENCOUNTER — Other Ambulatory Visit: Payer: Self-pay | Admitting: Family Medicine

## 2015-12-28 LAB — LIPID PANEL
Cholesterol: 159 mg/dL (ref 0–200)
HDL: 58 mg/dL (ref 35–70)
LDL Cholesterol: 87 mg/dL

## 2015-12-28 LAB — BASIC METABOLIC PANEL: Glucose: 95 mg/dL

## 2015-12-29 LAB — CMP12+LP+TP+TSH+6AC+CBC/D/PLT
A/G RATIO: 1.6 (ref 1.2–2.2)
ALBUMIN: 4.1 g/dL (ref 3.6–4.8)
ALT: 13 IU/L (ref 0–32)
AST: 16 IU/L (ref 0–40)
Alkaline Phosphatase: 74 IU/L (ref 39–117)
BASOS ABS: 0 10*3/uL (ref 0.0–0.2)
BILIRUBIN TOTAL: 0.3 mg/dL (ref 0.0–1.2)
BUN / CREAT RATIO: 22 (ref 12–28)
BUN: 17 mg/dL (ref 8–27)
Basos: 0 %
CHOLESTEROL TOTAL: 159 mg/dL (ref 100–199)
CREATININE: 0.77 mg/dL (ref 0.57–1.00)
Calcium: 9.2 mg/dL (ref 8.7–10.3)
Chloride: 101 mmol/L (ref 96–106)
Chol/HDL Ratio: 2.7 ratio units (ref 0.0–4.4)
EOS (ABSOLUTE): 0.2 10*3/uL (ref 0.0–0.4)
EOS: 4 %
FREE THYROXINE INDEX: 2 (ref 1.2–4.9)
GFR calc Af Amer: 96 mL/min/{1.73_m2} (ref >59)
GFR, EST NON AFRICAN AMERICAN: 84 mL/min/{1.73_m2} (ref >59)
GGT: 13 IU/L (ref 0–60)
Globulin, Total: 2.5 g/dL (ref 1.5–4.5)
Glucose: 95 mg/dL (ref 65–99)
HDL: 58 mg/dL (ref >39)
HEMOGLOBIN: 13 g/dL (ref 11.1–15.9)
Hematocrit: 40.3 % (ref 34.0–46.6)
IMMATURE GRANULOCYTES: 0 %
IRON: 58 ug/dL (ref 27–139)
Immature Grans (Abs): 0 10*3/uL (ref 0.0–0.1)
LDH: 189 IU/L (ref 119–226)
LDL Calculated: 87 mg/dL (ref 0–99)
LYMPHS ABS: 1.6 10*3/uL (ref 0.7–3.1)
Lymphs: 32 %
MCH: 29.5 pg (ref 26.6–33.0)
MCHC: 32.3 g/dL (ref 31.5–35.7)
MCV: 91 fL (ref 79–97)
MONOS ABS: 0.3 10*3/uL (ref 0.1–0.9)
Monocytes: 5 %
NEUTROS PCT: 59 %
Neutrophils Absolute: 2.8 10*3/uL (ref 1.4–7.0)
PHOSPHORUS: 4.4 mg/dL (ref 2.5–4.5)
PLATELETS: 248 10*3/uL (ref 150–379)
Potassium: 4.4 mmol/L (ref 3.5–5.2)
RBC: 4.41 x10E6/uL (ref 3.77–5.28)
RDW: 13.7 % (ref 12.3–15.4)
Sodium: 140 mmol/L (ref 134–144)
T3 UPTAKE RATIO: 25 % (ref 24–39)
T4 TOTAL: 7.8 ug/dL (ref 4.5–12.0)
TOTAL PROTEIN: 6.6 g/dL (ref 6.0–8.5)
TSH: 2.81 u[IU]/mL (ref 0.450–4.500)
Triglycerides: 71 mg/dL (ref 0–149)
Uric Acid: 3.4 mg/dL (ref 2.5–7.1)
VLDL CHOLESTEROL CAL: 14 mg/dL (ref 5–40)
WBC: 4.9 10*3/uL (ref 3.4–10.8)

## 2015-12-29 LAB — VITAMIN D 25 HYDROXY (VIT D DEFICIENCY, FRACTURES): VIT D 25 HYDROXY: 46.2 ng/mL (ref 30.0–100.0)

## 2015-12-29 LAB — HGB A1C W/O EAG: HEMOGLOBIN A1C: 6.2 % — AB (ref 4.8–5.6)

## 2016-01-14 ENCOUNTER — Other Ambulatory Visit: Payer: Self-pay | Admitting: Family Medicine

## 2016-02-01 ENCOUNTER — Ambulatory Visit (INDEPENDENT_AMBULATORY_CARE_PROVIDER_SITE_OTHER): Payer: PRIVATE HEALTH INSURANCE | Admitting: Family Medicine

## 2016-02-01 ENCOUNTER — Encounter: Payer: Self-pay | Admitting: Family Medicine

## 2016-02-01 VITALS — BP 108/64 | HR 88 | Temp 98.6°F | Resp 14 | Ht <= 58 in | Wt 163.8 lb

## 2016-02-01 DIAGNOSIS — B001 Herpesviral vesicular dermatitis: Secondary | ICD-10-CM

## 2016-02-01 DIAGNOSIS — M5030 Other cervical disc degeneration, unspecified cervical region: Secondary | ICD-10-CM

## 2016-02-01 DIAGNOSIS — J3089 Other allergic rhinitis: Secondary | ICD-10-CM | POA: Diagnosis not present

## 2016-02-01 DIAGNOSIS — E785 Hyperlipidemia, unspecified: Secondary | ICD-10-CM | POA: Diagnosis not present

## 2016-02-01 DIAGNOSIS — E038 Other specified hypothyroidism: Secondary | ICD-10-CM | POA: Diagnosis not present

## 2016-02-01 DIAGNOSIS — R202 Paresthesia of skin: Secondary | ICD-10-CM | POA: Diagnosis not present

## 2016-02-01 DIAGNOSIS — M19041 Primary osteoarthritis, right hand: Secondary | ICD-10-CM | POA: Diagnosis not present

## 2016-02-01 DIAGNOSIS — F331 Major depressive disorder, recurrent, moderate: Secondary | ICD-10-CM | POA: Diagnosis not present

## 2016-02-01 DIAGNOSIS — K219 Gastro-esophageal reflux disease without esophagitis: Secondary | ICD-10-CM | POA: Insufficient documentation

## 2016-02-01 DIAGNOSIS — J449 Chronic obstructive pulmonary disease, unspecified: Secondary | ICD-10-CM | POA: Diagnosis not present

## 2016-02-01 MED ORDER — MELOXICAM 15 MG PO TABS: 15.0000 mg | ORAL_TABLET | Freq: Every day | ORAL | Status: DC

## 2016-02-01 MED ORDER — GLYCOPYRROLATE-FORMOTEROL 9-4.8 MCG/ACT IN AERO
2.0000 | INHALATION_SPRAY | Freq: Two times a day (BID) | RESPIRATORY_TRACT | Status: DC
Start: 2016-02-01 — End: 2016-08-07

## 2016-02-01 MED ORDER — SIMVASTATIN 40 MG PO TABS
40.0000 mg | ORAL_TABLET | Freq: Every day | ORAL | Status: DC
Start: 2016-02-01 — End: 2016-11-06

## 2016-02-01 MED ORDER — ACYCLOVIR 400 MG PO TABS: 400.0000 mg | ORAL_TABLET | Freq: Three times a day (TID) | ORAL | Status: DC

## 2016-02-01 MED ORDER — LEVOTHYROXINE SODIUM 88 MCG PO TABS: 88.0000 ug | ORAL_TABLET | Freq: Every day | ORAL | Status: DC

## 2016-02-01 MED ORDER — BUPROPION HCL ER (XL) 150 MG PO TB24: 150.0000 mg | ORAL_TABLET | Freq: Every day | ORAL | Status: DC

## 2016-02-01 MED ORDER — GABAPENTIN 300 MG PO CAPS: 300.0000 mg | ORAL_CAPSULE | Freq: Three times a day (TID) | ORAL | Status: DC

## 2016-02-01 MED ORDER — SERTRALINE HCL 100 MG PO TABS: 200.0000 mg | ORAL_TABLET | Freq: Every day | ORAL | Status: DC

## 2016-02-01 MED ORDER — PREDNISONE 10 MG (48) PO TBPK
ORAL_TABLET | Freq: Every day | ORAL | Status: DC
Start: 2016-02-01 — End: 2016-03-04

## 2016-02-01 MED ORDER — OMEPRAZOLE 40 MG PO CPDR: 40.0000 mg | DELAYED_RELEASE_CAPSULE | Freq: Every day | ORAL | Status: DC

## 2016-02-01 MED ORDER — MONTELUKAST SODIUM 10 MG PO TABS: 10.0000 mg | ORAL_TABLET | Freq: Every day | ORAL | Status: DC

## 2016-02-01 NOTE — Progress Notes (Signed)
Name: Sara Andrews   MRN: [id number]    DOB: Mar 11, 1956   Date:02/01/2016       Progress Note  Subjective  Chief Complaint  Chief Complaint  Patient presents with  . Medication Refill  . Pinch Nerve    Back of left side of neck and radiates down to her left arm, patient had a X-Ray done at [facility] and is to follow up with Dr. Malvin JohnsPotter. But they reschedule her appointment due to Dr. Malvin JohnsPotter being out on vacation for 3 weeks, gave her Hydrocodone and Gabapentin which does not give her any relief.  Marland Kitchen. COPD    Uses Nebulizer at home and inhalers as well, well controlled with medication-stable  . Hyperlipidemia  . Gastroesophageal Reflux    Takes as needed, patient states she has been having alot of gas  . Allergic Rhinitis     Ear pressure and sinus pressure and doing nasal spray at night for relief.   . Fever Blisters    Needs refill  . Hypothyroidism    HPI  Major Depression: long history of depression and taking Zoloft since her husband died in 2008. She is taking 200 mg daily, she lives by herself. She states that she is feeling tired, no energy, difficulty sleeping at night, no suicidal thoughts or ideation. She has crying spells and feels hopeless at times.   COPD: she is using multiple inhalers, discussed change in therapy, she has about two flares per year. We will give her Eyvonne LeftBevespi today, she quit smoking in 2009. She has chronic SOB  AR: taking singulair every night, she states symptoms a little worse this past week, she has nasal congestion and facial pressure. She is using nasal spray that was given by the PA at her job. Continue current medications  GERD: taking Omeprazole daily, she states symptoms are intermittent now and is not taking medications daily, advised to try to stop medication, and try ranitidine.   Fever blisters: she has outbreaks usually during the Summer taking Acyclovir prn and needs a refills  Hypothyroidism: reviewed labs and TSH is at goal, she has chronic  dry skin, but denies constipation. She has fatigue, that is likely multifactorial.   DDD cervical and paresthesia left arm: she has gone to urgent care since May twice for numbness left hand, pain on left arm and had X-ray of her neck that showed DDD cervical spine, she has an appointment scheduled with Dr. Malvin JohnsPotter that is a neurologist, we will increase dose of Gabapentin and order EMG/NCS to see if carpal tunnel or cervical radiculitis   OA right hand: daily pain on dorsal aspect of right hand, aching, sore. Worse after work  Patient Active Problem List   Diagnosis Date Noted  . Gastroesophageal reflux disease without esophagitis 02/01/2016  . Primary osteoarthritis of right hand 02/01/2016  . Paresthesias in left hand 02/01/2016  . SOB (shortness of breath) 10/05/2013  . COPD (chronic obstructive pulmonary disease) (HCC) 10/05/2013  . History of smoking 10/05/2013  . Hyperlipidemia 10/05/2013  . Hypothyroidism 10/05/2013    Past Surgical History  Procedure Laterality Date  . Abdominal hysterectomy    . Shoulder surgery Right   . Hand surgery Right   . Tonsillectomy and adenoidectomy      Family History  Problem Relation Age of Onset  . Hypertension Daughter   . Breast cancer Maternal Aunt 60  . Breast cancer Paternal Aunt [id number]    Social History   Social History  . Marital Status:  Married    Spouse Name: N/A  . Number of Children: N/A  . Years of Education: N/A   Occupational History  . Not on file.   Social History Main Topics  . Smoking status: Former Smoker -- 2.00 packs/day for 30 years    Quit date: 09/03/2008  . Smokeless tobacco: Never Used  . Alcohol Use: No  . Drug Use: No  . Sexual Activity: Not on file   Other Topics Concern  . Not on file   Social History Narrative     Current outpatient prescriptions:  .  acyclovir (ZOVIRAX) 400 MG tablet, Take 1 tablet (400 mg total) by mouth 3 (three) times daily., Disp: 30 tablet, Rfl: 0 .  albuterol  (PROVENTIL) (2.5 MG/3ML) 0.083% nebulizer solution, Take 3 mLs (2.5 mg total) by nebulization every 6 (six) hours as needed for wheezing or shortness of breath., Disp: 75 mL, Rfl: 12 .  aspirin 81 MG EC tablet, TAKE 1 TABLET BY MOUTH EVERY DAY, Disp: 30 tablet, Rfl: 11 .  Cholecalciferol 50000 units TABS, Take by mouth., Disp: , Rfl:  .  gabapentin (NEURONTIN) 300 MG capsule, Take 1 capsule (300 mg total) by mouth 3 (three) times daily., Disp: 270 capsule, Rfl: 1 .  levothyroxine (SYNTHROID, LEVOTHROID) 88 MCG tablet, Take 1 tablet (88 mcg total) by mouth daily before breakfast., Disp: 90 tablet, Rfl: 1 .  Magnesium Oxide 400 MG CAPS, Take 1 capsule (400 mg total) by mouth once., Disp: 30 capsule, Rfl: 5 .  meloxicam (MOBIC) 15 MG tablet, Take 1 tablet (15 mg total) by mouth daily., Disp: 90 tablet, Rfl: 1 .  montelukast (SINGULAIR) 10 MG tablet, Take 1 tablet (10 mg total) by mouth daily., Disp: 90 tablet, Rfl: 1 .  omeprazole (PRILOSEC) 40 MG capsule, Take 1 capsule (40 mg total) by mouth daily., Disp: 90 capsule, Rfl: 1 .  sertraline (ZOLOFT) 100 MG tablet, Take 2 tablets (200 mg total) by mouth daily., Disp: 180 tablet, Rfl: 0 .  simvastatin (ZOCOR) 40 MG tablet, Take 1 tablet (40 mg total) by mouth at bedtime., Disp: 90 tablet, Rfl: 1 .  buPROPion (WELLBUTRIN XL) 150 MG 24 hr tablet, Take 1 tablet (150 mg total) by mouth daily., Disp: 90 tablet, Rfl: 0 .  Glycopyrrolate-Formoterol (BEVESPI AEROSPHERE) 9-4.8 MCG/ACT AERO, Inhale 2 puffs into the lungs 2 (two) times daily., Disp: 10.7 g, Rfl: 2  No Known Allergies   ROS  Constitutional: Negative for fever but has fatigue Respiratory: Positive  for cough and shortness of breath.   Cardiovascular: Negative for chest pain or palpitations.  Gastrointestinal: Negative for abdominal pain, no bowel changes.  Musculoskeletal: Negative for gait problem or joint swelling.  Skin: Negative for rash.  Neurological: Negative for dizziness , positive  for intermittent headache.  No other specific complaints in a complete review of systems (except as listed in HPI above).  Objective  Filed Vitals:   02/01/16 1328  BP: 108/64  Pulse: 88  Temp: 98.6 F (37 C)  TempSrc: Oral  Resp: 14  Height: 4\' 9"  (1.448 m)  Weight: 163 lb 12.8 oz (74.299 kg)  SpO2: 96%    Body mass index is 35.44 kg/(m^2).  Physical Exam  Constitutional: Patient appears well-developed and well-nourished. Obese  No distress.  HEENT: head atraumatic, normocephalic, pupils equal and reactive to light,  neck supple, but pain during palpation of posterior neck and left trapezium area, throat within normal limits Cardiovascular: Normal rate, regular rhythm and normal heart sounds.  No murmur heard. No BLE edema. Pulmonary/Chest: Effort normal and breath sounds normal. No respiratory distress. Abdominal: Soft.  There is no tenderness. Psychiatric: Patient has a normal mood and affect. behavior is normal. Judgment and thought content normal. Muscular skeletal: normal grip and sensation of both arms, but has deformities on both fingers.  Recent Results (from the past 2160 hour(s))  Basic metabolic panel     Status: None   Collection Time: 12/28/15 12:00 AM  Result Value Ref Range   Glucose 95 mg/dL  Lipid panel     Status: None   Collection Time: 12/28/15 12:00 AM  Result Value Ref Range   Cholesterol 159 0 - 200 mg/dL   HDL 58 35 - 70 mg/dL   LDL Cholesterol 87 mg/dL  [id number]/N/WGN     Status: None   Collection Time: 12/28/15  8:00 AM  Result Value Ref Range   Glucose 95 65 - 99 mg/dL   Uric Acid 3.4 2.5 - 7.1 mg/dL    Comment:            Therapeutic target for gout patients: <6.0   BUN 17 8 - 27 mg/dL   Creatinine, Ser 5.62 0.57 - 1.00 mg/dL   GFR calc non Af Amer 84 >59 mL/min/1.73   GFR calc Af Amer 96 >59 mL/min/1.73   BUN/Creatinine Ratio 22 12 - 28   Sodium 140 134 - 144 mmol/L   Potassium 4.4 3.5 - 5.2 mmol/L   Chloride 101 96 -  106 mmol/L   Calcium 9.2 8.7 - 10.3 mg/dL   Phosphorus 4.4 2.5 - 4.5 mg/dL   Total Protein 6.6 6.0 - 8.5 g/dL   Albumin 4.1 3.6 - 4.8 g/dL   Globulin, Total 2.5 1.5 - 4.5 g/dL   Albumin/Globulin Ratio 1.6 1.2 - 2.2   Bilirubin Total 0.3 0.0 - 1.2 mg/dL   Alkaline Phosphatase 74 39 - 117 IU/L   LDH 189 119 - 226 IU/L   AST 16 0 - 40 IU/L   ALT 13 0 - 32 IU/L   GGT 13 0 - 60 IU/L   Iron 58 27 - 139 ug/dL   Cholesterol, Total 130 100 - 199 mg/dL   Triglycerides 71 0 - 149 mg/dL   HDL 58 >86 mg/dL   VLDL Cholesterol Cal 14 5 - 40 mg/dL   LDL Calculated 87 0 - 99 mg/dL   Chol/HDL Ratio 2.7 0.0 - 4.4 ratio units    Comment:                                   T. Chol/HDL Ratio                                             Men  Women                               1/2 Avg.Risk  3.4    3.3                                   Avg.Risk  5.0    4.4  2X Avg.Risk  9.6    7.1                                3X Avg.Risk 23.4   11.0    Estimated CHD Risk  < 0.5 0.0 - 1.0  times avg.    Comment:                                   T. Chol/HDL Ratio                                             Men  Women                               1/2 Avg.Risk  3.4    3.3                                   Avg.Risk  5.0    4.4                                2X Avg.Risk  9.6    7.1                                3X Avg.Risk 23.4   11.0 The CHD Risk is based on the T. Chol/HDL ratio.  Other factors affect CHD Risk such as hypertension, smoking, diabetes, severe obesity, and family history of pre- mature CHD.    TSH 2.810 0.450 - 4.500 uIU/mL   T4, Total 7.8 4.5 - 12.0 ug/dL   T3 Uptake Ratio 25 24 - 39 %   Free Thyroxine Index 2.0 1.2 - 4.9   WBC 4.9 3.4 - 10.8 x10E3/uL   RBC 4.41 3.77 - 5.28 x10E6/uL   Hemoglobin 13.0 11.1 - 15.9 g/dL   Hematocrit 96.0 45.4 - 46.6 %   MCV 91 79 - 97 fL   MCH 29.5 26.6 - 33.0 pg   MCHC 32.3 31.5 - 35.7 g/dL   RDW 09.8 11.9 - 14.7 %   Platelets 248  150 - 379 x10E3/uL   Neutrophils 59 %   Lymphs 32 %   Monocytes 5 %   Eos 4 %   Basos 0 %   Neutrophils Absolute 2.8 1.4 - 7.0 x10E3/uL   Lymphocytes Absolute 1.6 0.7 - 3.1 x10E3/uL   Monocytes Absolute 0.3 0.1 - 0.9 x10E3/uL   EOS (ABSOLUTE) 0.2 0.0 - 0.4 x10E3/uL   Basophils Absolute 0.0 0.0 - 0.2 x10E3/uL   Immature Granulocytes 0 %   Immature Grans (Abs) 0.0 0.0 - 0.1 x10E3/uL  Hgb A1c w/o eAG     Status: Abnormal   Collection Time: 12/28/15  8:00 AM  Result Value Ref Range   Hgb A1c MFr Bld 6.2 (H) 4.8 - 5.6 %    Comment:          Pre-diabetes: 5.7 - 6.4          Diabetes: >6.4  Glycemic control for adults with diabetes: <7.0   VITAMIN D 25 Hydroxy (Vit-D Deficiency, Fractures)     Status: None   Collection Time: 12/28/15  8:00 AM  Result Value Ref Range   Vit D, 25-Hydroxy 46.2 30.0 - 100.0 ng/mL    Comment: Vitamin D deficiency has been defined by the Institute of Medicine and an Endocrine Society practice guideline as a level of serum 25-OH vitamin D less than 20 ng/mL (1,2). The Endocrine Society went on to further define vitamin D insufficiency as a level between 21 and 29 ng/mL (2). 1. IOM (Institute of Medicine). 2010. Dietary reference    intakes for calcium and D. [place]: The    [employer]. 2. Holick MF, Binkley [place], Bischoff-Ferrari HA, et al.    Evaluation, treatment, and prevention of vitamin D    deficiency: an Endocrine Society clinical practice    guideline. JCEM. 2011 Jul; 96(7):1911-30.     PHQ2/9: Depression screen [facility] 2/9 02/01/2016 04/30/2015 03/08/2015 01/24/2015  Decreased Interest 0 0 0 0  Down, Depressed, Hopeless 0 0 0 0  PHQ - 2 Score 0 0 0 0    Fall Risk: Fall Risk  02/01/2016 06/26/2015 04/30/2015 04/09/2015 03/08/2015  Falls in the past year? No No No No No    Functional Status Survey: Is the patient deaf or have difficulty hearing?: No Does the patient have difficulty seeing, even when wearing  glasses/contacts?: No Does the patient have difficulty concentrating, remembering, or making decisions?: No Does the patient have difficulty walking or climbing stairs?: No Does the patient have difficulty dressing or bathing?: No Does the patient have difficulty doing errands alone such as visiting a doctor's office or shopping?: No    Assessment & Plan  1. Hyperlipidemia  - simvastatin (ZOCOR) 40 MG tablet; Take 1 tablet (40 mg total) by mouth at bedtime.  Dispense: 90 tablet; Refill: 1  2. Paresthesias in left hand  - gabapentin (NEURONTIN) 300 MG capsule; Take 1 capsule (300 mg total) by mouth 3 (three) times daily.  Dispense: 270 capsule; Refill: 1 - NCV with EMG(electromyography); Future - Vitamin B12  3. Primary osteoarthritis of right hand  - meloxicam (MOBIC) 15 MG tablet; Take 1 tablet (15 mg total) by mouth daily.  Dispense: 90 tablet; Refill: 1  4. Chronic obstructive pulmonary disease, unspecified COPD type (HCC)  - Glycopyrrolate-Formoterol (BEVESPI AEROSPHERE) 9-4.8 MCG/ACT AERO; Inhale 2 puffs into the lungs 2 (two) times daily.  Dispense: 10.7 g; Refill: 2  5. Gastroesophageal reflux disease without esophagitis  - omeprazole (PRILOSEC) 40 MG capsule; Take 1 capsule (40 mg total) by mouth daily.  Dispense: 90 capsule; Refill: 1  6. Degenerative cervical disc  - gabapentin (NEURONTIN) 300 MG capsule; Take 1 capsule (300 mg total) by mouth 3 (three) times daily.  Dispense: 270 capsule; Refill: 1 We will try prednisone taper since it seems to be radiculitis from cervical spine ( stop meloxicam while taking it )  7. Other allergic rhinitis  - montelukast (SINGULAIR) 10 MG tablet; Take 1 tablet (10 mg total) by mouth daily.  Dispense: 90 tablet; Refill: 1  8. Other specified hypothyroidism  - levothyroxine (SYNTHROID, LEVOTHROID) 88 MCG tablet; Take 1 tablet (88 mcg total) by mouth daily before breakfast.  Dispense: 90 tablet; Refill: 1  9. Moderate episode of  recurrent major depressive disorder (HCC)  - sertraline (ZOLOFT) 100 MG tablet; Take 2 tablets (200 mg total) by mouth daily.  Dispense: 180 tablet; Refill: 0 -  buPROPion (WELLBUTRIN XL) 150 MG 24 hr tablet; Take 1 tablet (150 mg total) by mouth daily.  Dispense: 90 tablet; Refill: 0  10. Fever blister  - acyclovir (ZOVIRAX) 400 MG tablet; Take 1 tablet (400 mg total) by mouth 3 (three) times daily.  Dispense: 30 tablet; Refill: 0

## 2016-02-06 ENCOUNTER — Other Ambulatory Visit: Payer: Self-pay

## 2016-02-06 DIAGNOSIS — F331 Major depressive disorder, recurrent, moderate: Secondary | ICD-10-CM

## 2016-02-08 ENCOUNTER — Other Ambulatory Visit: Payer: Self-pay | Admitting: Family Medicine

## 2016-02-09 LAB — B12 AND FOLATE PANEL
Folate: 12.2 ng/mL (ref >3.0)
Vitamin B-12: 284 pg/mL (ref 211–946)

## 2016-02-19 ENCOUNTER — Encounter: Payer: Self-pay | Admitting: Family Medicine

## 2016-03-04 ENCOUNTER — Encounter: Payer: Self-pay | Admitting: Family Medicine

## 2016-03-04 ENCOUNTER — Ambulatory Visit (INDEPENDENT_AMBULATORY_CARE_PROVIDER_SITE_OTHER): Payer: PRIVATE HEALTH INSURANCE | Admitting: Family Medicine

## 2016-03-04 VITALS — BP 106/62 | HR 82 | Temp 97.9°F | Resp 16 | Ht <= 58 in | Wt 171.1 lb

## 2016-03-04 DIAGNOSIS — F331 Major depressive disorder, recurrent, moderate: Secondary | ICD-10-CM

## 2016-03-04 DIAGNOSIS — M5030 Other cervical disc degeneration, unspecified cervical region: Secondary | ICD-10-CM | POA: Diagnosis not present

## 2016-03-04 DIAGNOSIS — E538 Deficiency of other specified B group vitamins: Secondary | ICD-10-CM | POA: Diagnosis not present

## 2016-03-04 MED ORDER — SERTRALINE HCL 100 MG PO TABS: 200.0000 mg | ORAL_TABLET | Freq: Every day | ORAL | 0 refills | Status: DC

## 2016-03-04 MED ORDER — VITAMIN B-12 1000 MCG SL SUBL: 1.0000 | SUBLINGUAL_TABLET | Freq: Every day | SUBLINGUAL | 0 refills | Status: DC

## 2016-03-04 NOTE — Progress Notes (Signed)
Name: Sara Andrews   MRN: [id number]    DOB: 11/20/1955   Date:03/04/2016       Progress Note  Subjective  Chief Complaint  Chief Complaint  Patient presents with  . Follow-up    1 month  . Depression    At [facility] they offer where if you get 90 day prescriptions you only pay for the 2 months and get the 1 month free. Patient states she would like to get her prescriptions in 90 day scripts, new depression medication is working well-Bupropion.   Marland Kitchen Pinch Nerve    Stable, if she does not take medication her hands go numb.    HPI   Major Depression: long history of depression and taking Zoloft since her husband died in 12-18-2006. She is taking 200 mg daily, she lives by herself. She states that she is feeling tired, no energy, difficulty sleeping at night, no suicidal thoughts or ideation. She states crying spells resolved since  we added Wellbutrin XL 150  Mg last month, she has noticed getting more sleepy since we increased dose of Gabapentin. Denies suicidal thoughts or ideation. No missing work, able to take care of her house and yard.    DDD cervical and paresthesia left arm: she has gone to urgent care since Dec 18, 2022 twice for numbness left hand, pain on left arm and had X-ray of her neck that showed DDD cervical spine, she has an appointment scheduled with Dr. Malvin Johns that is a neurologist, we will increase dose of Gabapentin and order EMG/NCS to see if carpal tunnel or cervical radiculitis . Appointment is next week. She states she continues to have pain/numbness left hand. She also has daily neck pain. 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   B12 deficiency: not taking supplementation, feels tired, we will start medication now  Patient Active Problem List   Diagnosis Date Noted  . Degenerative cervical disc 03/04/2016  . Gastroesophageal reflux disease without esophagitis 02/01/2016  . Primary osteoarthritis of right hand  02/01/2016  . Paresthesias in left hand 02/01/2016  . SOB (shortness of breath) 10/05/2013  . COPD (chronic obstructive pulmonary disease) (HCC) 10/05/2013  . History of smoking 10/05/2013  . Hyperlipidemia 10/05/2013  . Hypothyroidism 10/05/2013    Past Surgical History:  Procedure Laterality Date  . ABDOMINAL HYSTERECTOMY    . HAND SURGERY Right   . SHOULDER SURGERY Right   . TONSILLECTOMY AND ADENOIDECTOMY      Family History  Problem Relation Age of Onset  . Hypertension Daughter   . Breast cancer Maternal Aunt 60  . Breast cancer Paternal Aunt 71    Social History   Social History  . Marital status: Married    Spouse name: N/A  . Number of children: N/A  . Years of education: N/A   Occupational History  . Not on file.   Social History Main Topics  . Smoking status: Former Smoker    Packs/day: 2.00    Years: 30.00    Quit date: 09/03/2008  . Smokeless tobacco: Never Used  . Alcohol use No  . Drug use: No  . Sexual activity: Not on file   Other Topics Concern  . Not on file   Social History Narrative  . No narrative on file     Current Outpatient Prescriptions:  .  acyclovir (ZOVIRAX) 400 MG tablet, Take 1 tablet (400 mg total) by mouth 3 (three) times daily.,  Disp: 30 tablet, Rfl: 0 .  albuterol (PROVENTIL) (2.5 MG/3ML) 0.083% nebulizer solution, Take 3 mLs (2.5 mg total) by nebulization every 6 (six) hours as needed for wheezing or shortness of breath., Disp: 75 mL, Rfl: 12 .  aspirin 81 MG EC tablet, TAKE 1 TABLET BY MOUTH EVERY DAY, Disp: 30 tablet, Rfl: 11 .  buPROPion (WELLBUTRIN XL) 150 MG 24 hr tablet, Take 1 tablet (150 mg total) by mouth daily., Disp: 90 tablet, Rfl: 0 .  Cholecalciferol 50000 units TABS, Take by mouth., Disp: , Rfl:  .  gabapentin (NEURONTIN) 300 MG capsule, Take 1 capsule (300 mg total) by mouth 3 (three) times daily., Disp: 270 capsule, Rfl: 1 .  Glycopyrrolate-Formoterol (BEVESPI AEROSPHERE) 9-4.8 MCG/ACT AERO, Inhale 2 puffs  into the lungs 2 (two) times daily., Disp: 10.7 g, Rfl: 2 .  levothyroxine (SYNTHROID, LEVOTHROID) 88 MCG tablet, Take 1 tablet (88 mcg total) by mouth daily before breakfast., Disp: 90 tablet, Rfl: 1 .  Magnesium Oxide 400 MG CAPS, Take 1 capsule (400 mg total) by mouth once., Disp: 30 capsule, Rfl: 5 .  meloxicam (MOBIC) 15 MG tablet, Take 1 tablet (15 mg total) by mouth daily., Disp: 90 tablet, Rfl: 1 .  montelukast (SINGULAIR) 10 MG tablet, Take 1 tablet (10 mg total) by mouth daily., Disp: 90 tablet, Rfl: 1 .  omeprazole (PRILOSEC) 40 MG capsule, Take 1 capsule (40 mg total) by mouth daily., Disp: 90 capsule, Rfl: 1 .  sertraline (ZOLOFT) 100 MG tablet, Take 2 tablets (200 mg total) by mouth daily., Disp: 180 tablet, Rfl: 0 .  simvastatin (ZOCOR) 40 MG tablet, Take 1 tablet (40 mg total) by mouth at bedtime., Disp: 90 tablet, Rfl: 1  No Known Allergies   ROS  Ten systems reviewed and is negative except as mentioned in HPI   Objective  Vitals:   03/04/16 1625  BP: 106/62  Pulse: 82  Resp: 16  Temp: 97.9 F (36.6 C)  TempSrc: Oral  SpO2: 95%  Weight: 171 lb 1.6 oz (77.6 kg)  Height: 4\' 9"  (1.448 m)    Body mass index is 37.03 kg/m.  Physical Exam  Constitutional: Patient appears well-developed and well-nourished. Obese  No distress.  HEENT: head atraumatic, normocephalic, pupils equal and reactive to light, neck supple pain during rom, throat within normal limits Cardiovascular: Normal rate, regular rhythm and normal heart sounds.  No murmur heard. No BLE edema. Pulmonary/Chest: Effort normal and breath sounds normal. No respiratory distress. Abdominal: Soft.  There is no tenderness. Psychiatric: Patient has a normal mood and affect. behavior is normal. Judgment and thought content normal. Muscular skeletal: pain during palpation of left lower back, negative straight leg raise Neurological : history of carpal tunnel release on left side, negative tinnels and Phalen's  test   Recent Results (from the past 2160 hour(s))  Basic metabolic panel     Status: None   Collection Time: 12/28/15 12:00 AM  Result Value Ref Range   Glucose 95 mg/dL  Lipid panel     Status: None   Collection Time: 12/28/15 12:00 AM  Result Value Ref Range   Cholesterol 159 0 - 200 mg/dL   HDL 58 35 - 70 mg/dL   LDL Cholesterol 87 mg/dL  ONG29+BM+WU+XLK+4MW+NUU/V/OZD     Status: None   Collection Time: 12/28/15  8:00 AM  Result Value Ref Range   Glucose 95 65 - 99 mg/dL   Uric Acid 3.4 2.5 - 7.1 mg/dL    Comment:  Therapeutic target for gout patients: <6.0   BUN 17 8 - 27 mg/dL   Creatinine, Ser 0.45 0.57 - 1.00 mg/dL   GFR calc non Af Amer 84 >59 mL/min/1.73   GFR calc Af Amer 96 >59 mL/min/1.73   BUN/Creatinine Ratio 22 12 - 28   Sodium 140 134 - 144 mmol/L   Potassium 4.4 3.5 - 5.2 mmol/L   Chloride 101 96 - 106 mmol/L   Calcium 9.2 8.7 - 10.3 mg/dL   Phosphorus 4.4 2.5 - 4.5 mg/dL   Total Protein 6.6 6.0 - 8.5 g/dL   Albumin 4.1 3.6 - 4.8 g/dL   Globulin, Total 2.5 1.5 - 4.5 g/dL   Albumin/Globulin Ratio 1.6 1.2 - 2.2   Bilirubin Total 0.3 0.0 - 1.2 mg/dL   Alkaline Phosphatase 74 39 - 117 IU/L   LDH 189 119 - 226 IU/L   AST 16 0 - 40 IU/L   ALT 13 0 - 32 IU/L   GGT 13 0 - 60 IU/L   Iron 58 27 - 139 ug/dL   Cholesterol, Total 409 100 - 199 mg/dL   Triglycerides 71 0 - 149 mg/dL   HDL 58 >81 mg/dL   VLDL Cholesterol Cal 14 5 - 40 mg/dL   LDL Calculated 87 0 - 99 mg/dL   Chol/HDL Ratio 2.7 0.0 - 4.4 ratio units    Comment:                                   T. Chol/HDL Ratio                                             Men  Women                               1/2 Avg.Risk  3.4    3.3                                   Avg.Risk  5.0    4.4                                2X Avg.Risk  9.6    7.1                                3X Avg.Risk 23.4   11.0    Estimated CHD Risk  < 0.5 0.0 - 1.0  times avg.    Comment:                                   T.  Chol/HDL Ratio                                             Men  Women  1/2 Avg.Risk  3.4    3.3                                   Avg.Risk  5.0    4.4                                2X Avg.Risk  9.6    7.1                                3X Avg.Risk 23.4   11.0 The CHD Risk is based on the T. Chol/HDL ratio.  Other factors affect CHD Risk such as hypertension, smoking, diabetes, severe obesity, and family history of pre- mature CHD.    TSH 2.810 0.450 - 4.500 uIU/mL   T4, Total 7.8 4.5 - 12.0 ug/dL   T3 Uptake Ratio 25 24 - 39 %   Free Thyroxine Index 2.0 1.2 - 4.9   WBC 4.9 3.4 - 10.8 x10E3/uL   RBC 4.41 3.77 - 5.28 x10E6/uL   Hemoglobin 13.0 11.1 - 15.9 g/dL   Hematocrit 16.140.3 09.634.0 - 46.6 %   MCV 91 79 - 97 fL   MCH 29.5 26.6 - 33.0 pg   MCHC 32.3 31.5 - 35.7 g/dL   RDW 04.513.7 40.912.3 - 81.115.4 %   Platelets 248 150 - 379 x10E3/uL   Neutrophils 59 %   Lymphs 32 %   Monocytes 5 %   Eos 4 %   Basos 0 %   Neutrophils Absolute 2.8 1.4 - 7.0 x10E3/uL   Lymphocytes Absolute 1.6 0.7 - 3.1 x10E3/uL   Monocytes Absolute 0.3 0.1 - 0.9 x10E3/uL   EOS (ABSOLUTE) 0.2 0.0 - 0.4 x10E3/uL   Basophils Absolute 0.0 0.0 - 0.2 x10E3/uL   Immature Granulocytes 0 %   Immature Grans (Abs) 0.0 0.0 - 0.1 x10E3/uL  Hgb A1c w/o eAG     Status: Abnormal   Collection Time: 12/28/15  8:00 AM  Result Value Ref Range   Hgb A1c MFr Bld 6.2 (H) 4.8 - 5.6 %    Comment:          Pre-diabetes: 5.7 - 6.4          Diabetes: >6.4          Glycemic control for adults with diabetes: <7.0   VITAMIN D 25 Hydroxy (Vit-D Deficiency, Fractures)     Status: None   Collection Time: 12/28/15  8:00 AM  Result Value Ref Range   Vit D, 25-Hydroxy 46.2 30.0 - 100.0 ng/mL    Comment: Vitamin D deficiency has been defined by the Institute of Medicine and an Endocrine Society practice guideline as a level of serum 25-OH vitamin D less than 20 ng/mL (1,2). The Endocrine Society went on to further  define vitamin D insufficiency as a level between 21 and 29 ng/mL (2). 1. IOM (Institute of Medicine). 2010. Dietary reference    intakes for calcium and D. [place]: The    [employer]. 2. Holick MF, Binkley [place], Bischoff-Ferrari HA, et al.    Evaluation, treatment, and prevention of vitamin D    deficiency: an Endocrine Society clinical practice    guideline. JCEM. 2011 Jul; 96(7):1911-30.   B12 and Folate Panel     Status: None   Collection Time:  02/08/16 10:00 AM  Result Value Ref Range   Vitamin B-12 284 211 - 946 pg/mL   Folate 12.2 >3.0 ng/mL    Comment: A serum folate concentration of less than 3.1 ng/mL is considered to represent clinical deficiency.       PHQ2/9: Depression screen [facility] 2/9 02/01/2016 04/30/2015 03/08/2015 01/24/2015  Decreased Interest 0 0 0 0  Down, Depressed, Hopeless 0 0 0 0  PHQ - 2 Score 0 0 0 0     Fall Risk: Fall Risk  02/01/2016 06/26/2015 04/30/2015 04/09/2015 03/08/2015  Falls in the past year? No No No No No      Assessment & Plan  1. Moderate episode of recurrent major depressive disorder (HCC)  - sertraline (ZOLOFT) 100 MG tablet; Take 2 tablets (200 mg total) by mouth daily.  Dispense: 180 tablet; Refill: 0  2. Degenerative cervical disc  Wondering why she is seeing Dr. Malvin Johns, she should see Dr. Council Mechanic  3. B12 deficiency  Offered B12 injection but she would like to take it orally  - Cyanocobalamin (VITAMIN B-12) 1000 MCG SUBL; Place 1 tablet (1,000 mcg total) under the tongue daily.  Dispense: 30 tablet; Refill: 0

## 2016-03-10 ENCOUNTER — Emergency Department (HOSPITAL_COMMUNITY): Payer: Worker's Compensation

## 2016-03-10 ENCOUNTER — Inpatient Hospital Stay (HOSPITAL_COMMUNITY)
Admission: EM | Admit: 2016-03-10 | Discharge: 2016-03-13 | DRG: 494 | Disposition: A | Discharge status: Home / Self Care | Payer: Worker's Compensation | Attending: Orthopaedic Surgery | Admitting: Orthopaedic Surgery

## 2016-03-10 ENCOUNTER — Inpatient Hospital Stay (HOSPITAL_COMMUNITY): Payer: Worker's Compensation | Admitting: Certified Registered"

## 2016-03-10 ENCOUNTER — Inpatient Hospital Stay (HOSPITAL_COMMUNITY): Payer: Worker's Compensation

## 2016-03-10 ENCOUNTER — Encounter (HOSPITAL_COMMUNITY)
Admission: EM | Disposition: A | Discharge status: Home / Self Care | Payer: Self-pay | Source: Home / Self Care | Attending: Orthopaedic Surgery

## 2016-03-10 ENCOUNTER — Encounter (HOSPITAL_COMMUNITY): Payer: Self-pay

## 2016-03-10 DIAGNOSIS — S82132A Displaced fracture of medial condyle of left tibia, initial encounter for closed fracture: Secondary | ICD-10-CM

## 2016-03-10 DIAGNOSIS — K219 Gastro-esophageal reflux disease without esophagitis: Secondary | ICD-10-CM | POA: Diagnosis present

## 2016-03-10 DIAGNOSIS — J449 Chronic obstructive pulmonary disease, unspecified: Secondary | ICD-10-CM | POA: Diagnosis present

## 2016-03-10 DIAGNOSIS — E785 Hyperlipidemia, unspecified: Secondary | ICD-10-CM | POA: Diagnosis present

## 2016-03-10 DIAGNOSIS — E039 Hypothyroidism, unspecified: Secondary | ICD-10-CM | POA: Diagnosis present

## 2016-03-10 DIAGNOSIS — S82142A Displaced bicondylar fracture of left tibia, initial encounter for closed fracture: Secondary | ICD-10-CM | POA: Diagnosis present

## 2016-03-10 DIAGNOSIS — Z419 Encounter for procedure for purposes other than remedying health state, unspecified: Secondary | ICD-10-CM

## 2016-03-10 DIAGNOSIS — W11XXXA Fall on and from ladder, initial encounter: Secondary | ICD-10-CM | POA: Diagnosis present

## 2016-03-10 DIAGNOSIS — F329 Major depressive disorder, single episode, unspecified: Secondary | ICD-10-CM | POA: Diagnosis present

## 2016-03-10 DIAGNOSIS — E119 Type 2 diabetes mellitus without complications: Secondary | ICD-10-CM | POA: Diagnosis present

## 2016-03-10 DIAGNOSIS — Z7982 Long term (current) use of aspirin: Secondary | ICD-10-CM

## 2016-03-10 DIAGNOSIS — Z79899 Other long term (current) drug therapy: Secondary | ICD-10-CM

## 2016-03-10 DIAGNOSIS — S82122A Displaced fracture of lateral condyle of left tibia, initial encounter for closed fracture: Secondary | ICD-10-CM | POA: Diagnosis present

## 2016-03-10 DIAGNOSIS — Z791 Long term (current) use of non-steroidal anti-inflammatories (NSAID): Secondary | ICD-10-CM

## 2016-03-10 DIAGNOSIS — M25562 Pain in left knee: Secondary | ICD-10-CM | POA: Diagnosis present

## 2016-03-10 HISTORY — DX: Gastro-esophageal reflux disease without esophagitis: K21.9

## 2016-03-10 HISTORY — PX: ORIF TIBIA PLATEAU: SHX2132

## 2016-03-10 HISTORY — DX: Unspecified asthma, uncomplicated: J45.909

## 2016-03-10 HISTORY — DX: Type 2 diabetes mellitus without complications: E11.9

## 2016-03-10 HISTORY — DX: Unspecified chronic bronchitis: J42

## 2016-03-10 HISTORY — DX: Unspecified osteoarthritis, unspecified site: M19.90

## 2016-03-10 HISTORY — DX: Hypothyroidism, unspecified: E03.9

## 2016-03-10 LAB — GLUCOSE, CAPILLARY
Glucose-Capillary: 82 mg/dL (ref 65–99)
Glucose-Capillary: 97 mg/dL (ref 65–99)

## 2016-03-10 LAB — CBC WITH DIFFERENTIAL/PLATELET
Basophils Absolute: 0 10*3/uL (ref 0.0–0.1)
Basophils Relative: 0 %
Eosinophils Absolute: 0.1 10*3/uL (ref 0.0–0.7)
Eosinophils Relative: 1 %
HCT: 42.1 % (ref 36.0–46.0)
Hemoglobin: 13 g/dL (ref 12.0–15.0)
Lymphocytes Relative: 23 %
Lymphs Abs: 1.7 10*3/uL (ref 0.7–4.0)
MCH: 29.3 pg (ref 26.0–34.0)
MCHC: 30.9 g/dL (ref 30.0–36.0)
MCV: 95 fL (ref 78.0–100.0)
Monocytes Absolute: 0.4 10*3/uL (ref 0.1–1.0)
Monocytes Relative: 5 %
Neutro Abs: 5.3 10*3/uL (ref 1.7–7.7)
Neutrophils Relative %: 71 %
Platelets: 248 10*3/uL (ref 150–400)
RBC: 4.43 MIL/uL (ref 3.87–5.11)
RDW: 13.6 % (ref 11.5–15.5)
WBC: 7.6 10*3/uL (ref 4.0–10.5)

## 2016-03-10 LAB — BASIC METABOLIC PANEL
Anion gap: 6 (ref 5–15)
BUN: 11 mg/dL (ref 6–20)
CO2: 26 mmol/L (ref 22–32)
Calcium: 9.5 mg/dL (ref 8.9–10.3)
Chloride: 107 mmol/L (ref 101–111)
Creatinine, Ser: 0.77 mg/dL (ref 0.44–1.00)
GFR calc Af Amer: >60 mL/min (ref >60)
GFR calc non Af Amer: >60 mL/min (ref >60)
Glucose, Bld: 103 mg/dL — ABNORMAL HIGH (ref 65–99)
Potassium: 4.3 mmol/L (ref 3.5–5.1)
Sodium: 139 mmol/L (ref 135–145)

## 2016-03-10 LAB — PROTIME-INR
INR: 0.98
Prothrombin Time: 13 seconds (ref 11.4–15.2)

## 2016-03-10 LAB — APTT: aPTT: 28 seconds (ref 24–36)

## 2016-03-10 LAB — SURGICAL PCR SCREEN
MRSA, PCR: NEGATIVE
STAPHYLOCOCCUS AUREUS: NEGATIVE

## 2016-03-10 SURGERY — OPEN REDUCTION INTERNAL FIXATION (ORIF) TIBIAL PLATEAU
Anesthesia: General | Site: Knee | Laterality: Left

## 2016-03-10 MED ORDER — LACTATED RINGERS IV SOLN
INTRAVENOUS | Status: DC
  Administered 2016-03-10 (×2): via INTRAVENOUS

## 2016-03-10 MED ORDER — ONDANSETRON HCL 4 MG/2ML IJ SOLN
INTRAMUSCULAR | Status: AC
  Filled 2016-03-10: qty 2

## 2016-03-10 MED ORDER — ARFORMOTEROL TARTRATE 15 MCG/2ML IN NEBU
15.0000 ug | INHALATION_SOLUTION | Freq: Two times a day (BID) | RESPIRATORY_TRACT | Status: DC
  Administered 2016-03-10 – 2016-03-13 (×6): 15 ug via RESPIRATORY_TRACT
  Filled 2016-03-10 (×6): qty 2

## 2016-03-10 MED ORDER — FENTANYL CITRATE (PF) 250 MCG/5ML IJ SOLN
INTRAMUSCULAR | Status: AC
  Filled 2016-03-10: qty 5

## 2016-03-10 MED ORDER — LIDOCAINE HCL (CARDIAC) 20 MG/ML IV SOLN
INTRAVENOUS | Status: DC | PRN
  Administered 2016-03-10: 60 mg via INTRAVENOUS

## 2016-03-10 MED ORDER — LEVOTHYROXINE SODIUM 88 MCG PO TABS
88.0000 ug | ORAL_TABLET | Freq: Every day | ORAL | Status: DC
  Administered 2016-03-11 – 2016-03-13 (×3): 88 ug via ORAL
  Filled 2016-03-10 (×3): qty 1

## 2016-03-10 MED ORDER — SERTRALINE HCL 100 MG PO TABS
200.0000 mg | ORAL_TABLET | Freq: Every day | ORAL | Status: DC
  Administered 2016-03-11 – 2016-03-13 (×3): 200 mg via ORAL
  Filled 2016-03-10: qty 4
  Filled 2016-03-10 (×2): qty 2

## 2016-03-10 MED ORDER — PANTOPRAZOLE SODIUM 40 MG PO TBEC
40.0000 mg | DELAYED_RELEASE_TABLET | Freq: Every day | ORAL | Status: DC
  Administered 2016-03-11 – 2016-03-13 (×3): 40 mg via ORAL
  Filled 2016-03-10 (×3): qty 1

## 2016-03-10 MED ORDER — MIDAZOLAM HCL 5 MG/5ML IJ SOLN
INTRAMUSCULAR | Status: DC | PRN
  Administered 2016-03-10: 2 mg via INTRAVENOUS

## 2016-03-10 MED ORDER — PROMETHAZINE HCL 25 MG/ML IJ SOLN: 6.2500 mg | INTRAMUSCULAR | Status: DC | PRN

## 2016-03-10 MED ORDER — GABAPENTIN 300 MG PO CAPS
300.0000 mg | ORAL_CAPSULE | Freq: Three times a day (TID) | ORAL | Status: DC
  Administered 2016-03-11 – 2016-03-13 (×8): 300 mg via ORAL
  Filled 2016-03-10 (×8): qty 1

## 2016-03-10 MED ORDER — PROPOFOL 10 MG/ML IV BOLUS
INTRAVENOUS | Status: AC
  Filled 2016-03-10: qty 20

## 2016-03-10 MED ORDER — METOCLOPRAMIDE HCL 5 MG PO TABS: 5.0000 mg | ORAL_TABLET | Freq: Three times a day (TID) | ORAL | Status: DC | PRN

## 2016-03-10 MED ORDER — KETOROLAC TROMETHAMINE 15 MG/ML IJ SOLN
15.0000 mg | Freq: Once | INTRAMUSCULAR | Status: AC
  Administered 2016-03-10: 15 mg via INTRAMUSCULAR
  Filled 2016-03-10: qty 1

## 2016-03-10 MED ORDER — METHOCARBAMOL 500 MG PO TABS
500.0000 mg | ORAL_TABLET | Freq: Four times a day (QID) | ORAL | Status: DC | PRN
  Administered 2016-03-10 – 2016-03-12 (×5): 500 mg via ORAL
  Filled 2016-03-10 (×5): qty 1

## 2016-03-10 MED ORDER — PROPOFOL 10 MG/ML IV BOLUS
INTRAVENOUS | Status: DC | PRN
  Administered 2016-03-10: 70 mg via INTRAVENOUS
  Administered 2016-03-10: 30 mg via INTRAVENOUS

## 2016-03-10 MED ORDER — MONTELUKAST SODIUM 10 MG PO TABS
10.0000 mg | ORAL_TABLET | Freq: Every day | ORAL | Status: DC
  Administered 2016-03-11 – 2016-03-12 (×2): 10 mg via ORAL
  Filled 2016-03-10 (×2): qty 1

## 2016-03-10 MED ORDER — HYDROMORPHONE HCL 1 MG/ML IJ SOLN
INTRAMUSCULAR | Status: AC
  Administered 2016-03-10: 0.25 mg via INTRAVENOUS
  Filled 2016-03-10: qty 1

## 2016-03-10 MED ORDER — METOCLOPRAMIDE HCL 5 MG/ML IJ SOLN: 5.0000 mg | Freq: Three times a day (TID) | INTRAMUSCULAR | Status: DC | PRN

## 2016-03-10 MED ORDER — PHENYLEPHRINE HCL 10 MG/ML IJ SOLN
INTRAMUSCULAR | Status: DC | PRN
  Administered 2016-03-10 (×2): 80 ug via INTRAVENOUS

## 2016-03-10 MED ORDER — FENTANYL CITRATE (PF) 100 MCG/2ML IJ SOLN
INTRAMUSCULAR | Status: DC | PRN
  Administered 2016-03-10 (×7): 50 ug via INTRAVENOUS

## 2016-03-10 MED ORDER — 0.9 % SODIUM CHLORIDE (POUR BTL) OPTIME
TOPICAL | Status: DC | PRN
  Administered 2016-03-10: 1000 mL

## 2016-03-10 MED ORDER — LIDOCAINE 2% (20 MG/ML) 5 ML SYRINGE
INTRAMUSCULAR | Status: AC
  Filled 2016-03-10: qty 5

## 2016-03-10 MED ORDER — BUPROPION HCL ER (XL) 150 MG PO TB24
150.0000 mg | ORAL_TABLET | Freq: Every day | ORAL | Status: DC
  Administered 2016-03-11 – 2016-03-13 (×3): 150 mg via ORAL
  Filled 2016-03-10 (×3): qty 1

## 2016-03-10 MED ORDER — HYDROMORPHONE HCL 1 MG/ML IJ SOLN
0.7500 mg | Freq: Once | INTRAMUSCULAR | Status: DC
  Filled 2016-03-10: qty 1

## 2016-03-10 MED ORDER — METHOCARBAMOL 1000 MG/10ML IJ SOLN
500.0000 mg | Freq: Four times a day (QID) | INTRAMUSCULAR | Status: DC | PRN
  Filled 2016-03-10: qty 5

## 2016-03-10 MED ORDER — SUGAMMADEX SODIUM 200 MG/2ML IV SOLN
INTRAVENOUS | Status: AC
  Filled 2016-03-10: qty 2

## 2016-03-10 MED ORDER — ALBUTEROL SULFATE (2.5 MG/3ML) 0.083% IN NEBU: 2.5000 mg | INHALATION_SOLUTION | Freq: Four times a day (QID) | RESPIRATORY_TRACT | Status: DC | PRN

## 2016-03-10 MED ORDER — SIMVASTATIN 40 MG PO TABS
40.0000 mg | ORAL_TABLET | Freq: Every day | ORAL | Status: DC
  Administered 2016-03-11 – 2016-03-12 (×3): 40 mg via ORAL
  Filled 2016-03-10 (×3): qty 1

## 2016-03-10 MED ORDER — ACETAMINOPHEN 650 MG RE SUPP: 650.0000 mg | Freq: Four times a day (QID) | RECTAL | Status: DC | PRN

## 2016-03-10 MED ORDER — ACETAMINOPHEN 325 MG PO TABS
650.0000 mg | ORAL_TABLET | Freq: Four times a day (QID) | ORAL | Status: DC | PRN
  Administered 2016-03-11: 650 mg via ORAL
  Filled 2016-03-10: qty 2

## 2016-03-10 MED ORDER — SODIUM CHLORIDE 0.45 % IV SOLN
INTRAVENOUS | Status: DC
  Administered 2016-03-10 – 2016-03-12 (×3): via INTRAVENOUS

## 2016-03-10 MED ORDER — KETOROLAC TROMETHAMINE 30 MG/ML IJ SOLN
30.0000 mg | Freq: Once | INTRAMUSCULAR | Status: AC
  Administered 2016-03-10: 30 mg via INTRAVENOUS
  Filled 2016-03-10: qty 1

## 2016-03-10 MED ORDER — OXYCODONE HCL 5 MG PO TABS
ORAL_TABLET | ORAL | Status: AC
  Filled 2016-03-10: qty 2

## 2016-03-10 MED ORDER — ONDANSETRON HCL 4 MG PO TABS: 4.0000 mg | ORAL_TABLET | Freq: Four times a day (QID) | ORAL | Status: DC | PRN

## 2016-03-10 MED ORDER — POLYETHYLENE GLYCOL 3350 17 G PO PACK
17.0000 g | PACK | Freq: Every day | ORAL | Status: DC | PRN
  Administered 2016-03-12: 17 g via ORAL
  Filled 2016-03-10: qty 1

## 2016-03-10 MED ORDER — ALBUTEROL SULFATE HFA 108 (90 BASE) MCG/ACT IN AERS
INHALATION_SPRAY | RESPIRATORY_TRACT | Status: DC | PRN
  Administered 2016-03-10 (×3): 2 via RESPIRATORY_TRACT

## 2016-03-10 MED ORDER — SUCCINYLCHOLINE CHLORIDE 20 MG/ML IJ SOLN
INTRAMUSCULAR | Status: DC | PRN
  Administered 2016-03-10: 100 mg via INTRAVENOUS

## 2016-03-10 MED ORDER — CEFAZOLIN IN D5W 1 GM/50ML IV SOLN
1.0000 g | Freq: Three times a day (TID) | INTRAVENOUS | Status: AC
  Administered 2016-03-11 (×2): 1 g via INTRAVENOUS
  Filled 2016-03-10 (×2): qty 50

## 2016-03-10 MED ORDER — CEFAZOLIN SODIUM 1 G IJ SOLR
INTRAMUSCULAR | Status: DC | PRN
  Administered 2016-03-10: 2 g via INTRAMUSCULAR

## 2016-03-10 MED ORDER — FENTANYL CITRATE (PF) 100 MCG/2ML IJ SOLN
INTRAMUSCULAR | Status: AC
  Administered 2016-03-10: 100 ug via INTRAVENOUS
  Filled 2016-03-10: qty 2

## 2016-03-10 MED ORDER — FENTANYL CITRATE (PF) 100 MCG/2ML IJ SOLN
100.0000 ug | Freq: Once | INTRAMUSCULAR | Status: AC
  Administered 2016-03-10: 100 ug via INTRAVENOUS

## 2016-03-10 MED ORDER — DOCUSATE SODIUM 100 MG PO CAPS
100.0000 mg | ORAL_CAPSULE | Freq: Two times a day (BID) | ORAL | Status: DC
  Administered 2016-03-11 – 2016-03-13 (×5): 100 mg via ORAL
  Filled 2016-03-10 (×5): qty 1

## 2016-03-10 MED ORDER — ONDANSETRON HCL 4 MG/2ML IJ SOLN: 4.0000 mg | Freq: Four times a day (QID) | INTRAMUSCULAR | Status: DC | PRN

## 2016-03-10 MED ORDER — ONDANSETRON HCL 4 MG/2ML IJ SOLN
INTRAMUSCULAR | Status: DC | PRN
Start: 2016-03-10 — End: 2016-03-10
  Administered 2016-03-10: 4 mg via INTRAVENOUS

## 2016-03-10 MED ORDER — HYDROMORPHONE HCL 1 MG/ML IJ SOLN
0.2500 mg | INTRAMUSCULAR | Status: DC | PRN
  Administered 2016-03-10 (×3): 0.25 mg via INTRAVENOUS

## 2016-03-10 MED ORDER — HYDROMORPHONE HCL 1 MG/ML IJ SOLN
1.0000 mg | INTRAMUSCULAR | Status: DC | PRN
  Administered 2016-03-10 – 2016-03-11 (×4): 1 mg via INTRAVENOUS
  Filled 2016-03-10 (×5): qty 1

## 2016-03-10 MED ORDER — SUGAMMADEX SODIUM 200 MG/2ML IV SOLN
INTRAVENOUS | Status: DC | PRN
  Administered 2016-03-10: 160 mg via INTRAVENOUS

## 2016-03-10 MED ORDER — UMECLIDINIUM BROMIDE 62.5 MCG/INH IN AEPB
1.0000 | INHALATION_SPRAY | Freq: Every day | RESPIRATORY_TRACT | Status: DC
  Administered 2016-03-11 – 2016-03-13 (×3): 1 via RESPIRATORY_TRACT
  Filled 2016-03-10: qty 7

## 2016-03-10 MED ORDER — OXYCODONE HCL 5 MG PO TABS
5.0000 mg | ORAL_TABLET | ORAL | Status: DC | PRN
  Administered 2016-03-10 – 2016-03-13 (×8): 10 mg via ORAL
  Filled 2016-03-10 (×9): qty 2

## 2016-03-10 MED ORDER — MIDAZOLAM HCL 2 MG/2ML IJ SOLN
INTRAMUSCULAR | Status: AC
  Filled 2016-03-10: qty 2

## 2016-03-10 MED ORDER — HYDROMORPHONE HCL 1 MG/ML IJ SOLN
0.7500 mg | Freq: Once | INTRAMUSCULAR | Status: AC
  Administered 2016-03-10: 0.75 mg via INTRAMUSCULAR

## 2016-03-10 MED ORDER — ROCURONIUM BROMIDE 100 MG/10ML IV SOLN
INTRAVENOUS | Status: DC | PRN
  Administered 2016-03-10: 40 mg via INTRAVENOUS

## 2016-03-10 SURGICAL SUPPLY — 89 items
BANDAGE ELASTIC 4 VELCRO ST LF (GAUZE/BANDAGES/DRESSINGS) ×1 IMPLANT
BANDAGE ELASTIC 6 VELCRO ST LF (GAUZE/BANDAGES/DRESSINGS) ×1 IMPLANT
BIT DRILL 2.5X2.75 QC CALB (BIT) ×1 IMPLANT
BLADE SURG 10 STRL SS (BLADE) ×2 IMPLANT
BLADE SURG ROTATE 9660 (MISCELLANEOUS) IMPLANT
BNDG GAUZE ELAST 4 BULKY (GAUZE/BANDAGES/DRESSINGS) IMPLANT
BONE CANC CHIPS 20CC PCAN1/4 (Bone Implant) ×2 IMPLANT
CHIPS CANC BONE 20CC PCAN1/4 (Bone Implant) ×1 IMPLANT
CLEANER TIP ELECTROSURG 2X2 (MISCELLANEOUS) ×2 IMPLANT
COVER MAYO STAND STRL (DRAPES) ×2 IMPLANT
COVER SURGICAL LIGHT HANDLE (MISCELLANEOUS) ×2 IMPLANT
CUFF TOURNIQUET SINGLE 34IN LL (TOURNIQUET CUFF) ×1 IMPLANT
CUFF TOURNIQUET SINGLE 44IN (TOURNIQUET CUFF) IMPLANT
DRAPE C-ARM 42X72 X-RAY (DRAPES) ×2 IMPLANT
DRAPE C-ARMOR (DRAPES) ×1 IMPLANT
DRAPE INCISE IOBAN 66X45 STRL (DRAPES) ×2 IMPLANT
DRAPE U-SHAPE 47X51 STRL (DRAPES) ×2 IMPLANT
DRILL BIT 2.5X100 214235007 (MISCELLANEOUS) ×1 IMPLANT
DRILL BIT 2.7X100 214235006 DU (MISCELLANEOUS) ×1 IMPLANT
DRSG ADAPTIC 3X8 NADH LF (GAUZE/BANDAGES/DRESSINGS) IMPLANT
DRSG PAD ABDOMINAL 8X10 ST (GAUZE/BANDAGES/DRESSINGS) ×1 IMPLANT
DURAPREP 26ML APPLICATOR (WOUND CARE) ×2 IMPLANT
ELECT REM PT RETURN 9FT ADLT (ELECTROSURGICAL) ×2
ELECTRODE REM PT RTRN 9FT ADLT (ELECTROSURGICAL) ×1 IMPLANT
EVACUATOR 1/8 PVC DRAIN (DRAIN) IMPLANT
GAUZE SPONGE 4X4 12PLY STRL (GAUZE/BANDAGES/DRESSINGS) ×1 IMPLANT
GAUZE XEROFORM 5X9 LF (GAUZE/BANDAGES/DRESSINGS) ×1 IMPLANT
GLOVE BIOGEL PI IND STRL 7.5 (GLOVE) ×1 IMPLANT
GLOVE BIOGEL PI IND STRL 8 (GLOVE) ×1 IMPLANT
GLOVE BIOGEL PI INDICATOR 7.5 (GLOVE) ×1
GLOVE BIOGEL PI INDICATOR 8 (GLOVE) ×1
GLOVE ECLIPSE 7.0 STRL STRAW (GLOVE) ×2 IMPLANT
GLOVE ORTHO TXT STRL SZ7.5 (GLOVE) ×2 IMPLANT
GOWN STRL REUS W/ TWL LRG LVL3 (GOWN DISPOSABLE) ×2 IMPLANT
GOWN STRL REUS W/ TWL XL LVL3 (GOWN DISPOSABLE) ×1 IMPLANT
GOWN STRL REUS W/TWL LRG LVL3 (GOWN DISPOSABLE) ×4
GOWN STRL REUS W/TWL XL LVL3 (GOWN DISPOSABLE) ×2
GRAFT BNE CANC CHIPS 1-8 20CC (Bone Implant) IMPLANT
IMMOBILIZER KNEE 22 (SOFTGOODS) ×1 IMPLANT
KIT BASIN OR (CUSTOM PROCEDURE TRAY) ×2 IMPLANT
KIT ROOM TURNOVER OR (KITS) ×2 IMPLANT
MANIFOLD NEPTUNE II (INSTRUMENTS) ×2 IMPLANT
NDL HYPO 25X1 1.5 SAFETY (NEEDLE) ×1 IMPLANT
NEEDLE HYPO 25X1 1.5 SAFETY (NEEDLE) ×2 IMPLANT
NS IRRIG 1000ML POUR BTL (IV SOLUTION) ×2 IMPLANT
PACK ORTHO EXTREMITY (CUSTOM PROCEDURE TRAY) ×2 IMPLANT
PAD ARMBOARD 7.5X6 YLW CONV (MISCELLANEOUS) ×4 IMPLANT
PAD CAST 4YDX4 CTTN HI CHSV (CAST SUPPLIES) IMPLANT
PADDING CAST COTTON 4X4 STRL (CAST SUPPLIES) ×2
PADDING CAST COTTON 6X4 STRL (CAST SUPPLIES) ×1 IMPLANT
PLATE LOCK 5H STD LT PROX TIB (Plate) ×1 IMPLANT
SCREW 3.5X85 LOPROFILE (Screw) ×1 IMPLANT
SCREW CORT FT 32X3.5XNONLOCK (Screw) IMPLANT
SCREW CORT T15 TPR 55X3.5XST (Screw) IMPLANT
SCREW CORTICAL 3.5MM  30MM (Screw) ×1 IMPLANT
SCREW CORTICAL 3.5MM  32MM (Screw) ×1 IMPLANT
SCREW CORTICAL 3.5MM  34MM (Screw) ×2 IMPLANT
SCREW CORTICAL 3.5MM  55MM (Screw) ×1 IMPLANT
SCREW CORTICAL 3.5MM 30MM (Screw) IMPLANT
SCREW CORTICAL 3.5MM 32MM (Screw) ×1 IMPLANT
SCREW CORTICAL 3.5MM 34MM (Screw) IMPLANT
SCREW CORTICAL 3.5MM 38MM (Screw) ×1 IMPLANT
SCREW CORTICAL 3.5MM 55MM (Screw) IMPLANT
SCREW CORTICAL 3.5X46MM (Screw) ×1 IMPLANT
SCREW CORTICAL 3.5X55MM (Screw) ×2 IMPLANT
SCREW LOCK CORT STAR 3.5X46 (Screw) ×1 IMPLANT
SCREW LOW PROFILE 3.5MMX42 (Screw) ×1 IMPLANT
SCREW LP 3.5X65MM (Screw) ×2 IMPLANT
SCREW LP 3.5X70MM (Screw) ×2 IMPLANT
SCREW LP 3.5X75MM (Screw) ×2 IMPLANT
SPONGE LAP 18X18 X RAY DECT (DISPOSABLE) ×2 IMPLANT
STAPLER VISISTAT 35W (STAPLE) IMPLANT
STOCKINETTE IMPERVIOUS LG (DRAPES) ×2 IMPLANT
SUCTION FRAZIER HANDLE 10FR (MISCELLANEOUS) ×1
SUCTION TUBE FRAZIER 10FR DISP (MISCELLANEOUS) ×1 IMPLANT
SUT ETHIBOND 2 0 V5 (SUTURE) ×2 IMPLANT
SUT VIC AB 0 CT1 27 (SUTURE) ×2
SUT VIC AB 0 CT1 27XBRD ANBCTR (SUTURE) ×1 IMPLANT
SUT VIC AB 1 CT1 27 (SUTURE) ×2
SUT VIC AB 1 CT1 27XBRD ANBCTR (SUTURE) ×1 IMPLANT
SUT VIC AB 2-0 CT1 27 (SUTURE)
SUT VIC AB 2-0 CT1 TAPERPNT 27 (SUTURE) IMPLANT
SYR CONTROL 10ML LL (SYRINGE) ×2 IMPLANT
TOWEL OR 17X24 6PK STRL BLUE (TOWEL DISPOSABLE) ×2 IMPLANT
TOWEL OR 17X26 10 PK STRL BLUE (TOWEL DISPOSABLE) ×2 IMPLANT
TUBE CONNECTING 12X1/4 (SUCTIONS) ×2 IMPLANT
WATER STERILE IRR 1000ML POUR (IV SOLUTION) ×4 IMPLANT
WIRE K 1.6MM 144256 (MISCELLANEOUS) ×2 IMPLANT
YANKAUER SUCT BULB TIP NO VENT (SUCTIONS) ×2 IMPLANT

## 2016-03-10 NOTE — ED Triage Notes (Signed)
Pt missed step on ladder while at work, left shin hit ladder. Pt able to move LLE but painful to bear weight. Pt tearful during triage.

## 2016-03-10 NOTE — ED Notes (Signed)
Pt assisted to bedside commode w/ 1 staff assist.

## 2016-03-10 NOTE — ED Provider Notes (Signed)
[facility]-EMERGENCY DEPT Provider Note   CSN: [id number] Arrival date & time: 03/10/16  0900    First MD Initiated Contact with Patient 03/10/16 0947     By signing my name below, I, Nelwyn SalisburyJoshua Fowler, attest that this documentation has been prepared under the direction and in the presence of Raeford RazorStephen Masson Nalepa, MD . Electronically Signed: Nelwyn SalisburyJoshua Fowler, Scribe. 03/10/2016. 9:41 AM.  History   Chief Complaint Chief Complaint  Patient presents with  . Leg Injury   The history is provided by the patient and a friend. No language interpreter was used.    HPI Comments:  Sara Andrews is a 60 y.o. female who presents to the Emergency Department complaining of constant  left lower leg pain s/p fall occurring earlier this morning. She was on a ladder at work when she missed the last step and fell onto the cement injuring the shin area. Pt indicates she can not put any weight on the leg. She denies any associated pain in her feet, hip or groin.   Past Medical History:  Diagnosis Date  . Anxiety   . COPD (chronic obstructive pulmonary disease) (HCC)   . Cough   . Depression   . Diabetes (HCC)   . Esophagitis, reflux   . Hyperlipidemia   . IBS (irritable bowel syndrome)   . Lumbago   . Muscle pain   . Osteoarthritis   . Sinus disorder   . Thyroid disease   . Uncomplicated herpes simplex     Patient Active Problem List   Diagnosis Date Noted  . Degenerative cervical disc 03/04/2016  . Gastroesophageal reflux disease without esophagitis 02/01/2016  . Primary osteoarthritis of right hand 02/01/2016  . Paresthesias in left hand 02/01/2016  . SOB (shortness of breath) 10/05/2013  . COPD (chronic obstructive pulmonary disease) (HCC) 10/05/2013  . History of smoking 10/05/2013  . Hyperlipidemia 10/05/2013  . Hypothyroidism 10/05/2013    Past Surgical History:  Procedure Laterality Date  . ABDOMINAL HYSTERECTOMY    . HAND SURGERY Right   . SHOULDER SURGERY Right   . TONSILLECTOMY AND  ADENOIDECTOMY      OB History    No data available       Home Medications    Prior to Admission medications   Medication Sig Start Date End Date Taking? Authorizing Provider  acyclovir (ZOVIRAX) 400 MG tablet Take 1 tablet (400 mg total) by mouth 3 (three) times daily. 02/01/16   Alba CoryKrichna Sowles, MD  albuterol (PROVENTIL) (2.5 MG/3ML) 0.083% nebulizer solution Take 3 mLs (2.5 mg total) by nebulization every 6 (six) hours as needed for wheezing or shortness of breath. 03/08/15   Dennison MascotLemont Morrisey, MD  aspirin 81 MG EC tablet TAKE 1 TABLET BY MOUTH EVERY DAY 03/13/15   Dennison MascotLemont Morrisey, MD  buPROPion (WELLBUTRIN XL) 150 MG 24 hr tablet Take 1 tablet (150 mg total) by mouth daily. 02/01/16   Alba CoryKrichna Sowles, MD  Cholecalciferol 50000 units TABS Take by mouth.    Historical Provider, MD  Cyanocobalamin (VITAMIN B-12) 1000 MCG SUBL Place 1 tablet (1,000 mcg total) under the tongue daily. 03/04/16   Alba CoryKrichna Sowles, MD  gabapentin (NEURONTIN) 300 MG capsule Take 1 capsule (300 mg total) by mouth 3 (three) times daily. 02/01/16 01/31/17  Alba CoryKrichna Sowles, MD  Glycopyrrolate-Formoterol (BEVESPI AEROSPHERE) 9-4.8 MCG/ACT AERO Inhale 2 puffs into the lungs 2 (two) times daily. 02/01/16   Alba CoryKrichna Sowles, MD  levothyroxine (SYNTHROID, LEVOTHROID) 88 MCG tablet Take 1 tablet (88 mcg total) by mouth daily  before breakfast. 02/01/16   Alba Cory, MD  Magnesium Oxide 400 MG CAPS Take 1 capsule (400 mg total) by mouth once. 04/30/15   Dennison Mascot, MD  meloxicam (MOBIC) 15 MG tablet Take 1 tablet (15 mg total) by mouth daily. 02/01/16   Alba Cory, MD  montelukast (SINGULAIR) 10 MG tablet Take 1 tablet (10 mg total) by mouth daily. 02/01/16   Alba Cory, MD  omeprazole (PRILOSEC) 40 MG capsule Take 1 capsule (40 mg total) by mouth daily. 02/01/16   Alba Cory, MD  sertraline (ZOLOFT) 100 MG tablet Take 2 tablets (200 mg total) by mouth daily. 03/04/16   Alba Cory, MD  simvastatin (ZOCOR) 40 MG tablet Take 1 tablet  (40 mg total) by mouth at bedtime. 02/01/16   Alba Cory, MD    Family History Family History  Problem Relation Age of Onset  . Hypertension Daughter   . Diabetes Father   . Heart disease Father   . Breast cancer Maternal Aunt 60  . Breast cancer Paternal Aunt 71  . Depression Sister     Social History Social History  Substance Use Topics  . Smoking status: Former Smoker    Packs/day: 2.00    Years: 30.00    Quit date: 09/03/2008  . Smokeless tobacco: Never Used  . Alcohol use No     Allergies   Review of patient's allergies indicates no known allergies.   Review of Systems Review of Systems  Constitutional: Negative for fever.  Respiratory: Negative for shortness of breath.   Musculoskeletal: Positive for arthralgias and myalgias.  Neurological: Negative for numbness.  All other systems reviewed and are negative.    Physical Exam Updated Vital Signs BP 121/72   Pulse 68   Temp 98.3 F (36.8 C)   Resp 16   Ht 4\' 9"  (1.448 m)   Wt 171 lb (77.6 kg)   SpO2 98%   BMI 37.00 kg/m   Physical Exam  Constitutional: She is oriented to person, place, and time. She appears well-developed and well-nourished. No distress.  HENT:  Head: Normocephalic and atraumatic.  Eyes: Conjunctivae are normal.  Cardiovascular: Normal rate and regular rhythm.   Pulmonary/Chest: Effort normal and breath sounds normal. No respiratory distress.  Abdominal: She exhibits no distension.  Musculoskeletal:  Tenderness to mid proximal left tib/fib, closed injury. NVI.  Neurological: She is alert and oriented to person, place, and time.  Skin: Skin is warm and dry.  Psychiatric: She has a normal mood and affect.  Nursing note and vitals reviewed.    ED Treatments / Results  DIAGNOSTIC STUDIES:  Oxygen Saturation is 98% on RA, normal by my interpretation.    COORDINATION OF CARE:  9:40 AM Discussed treatment plan with pt at bedside which included pain medication, Xray, and EKG and  pt agreed to plan.  Labs (all labs ordered are listed, but only abnormal results are displayed) Labs Reviewed - No data to display  EKG  EKG Interpretation None       Radiology Dg Tibia/fibula Left  Result Date: 03/10/2016 CLINICAL DATA:  Fall today with left knee pain and swelling, initial encounter EXAM: LEFT TIBIA AND FIBULA - 2 VIEW COMPARISON:  None. FINDINGS: There is a comminuted fracture the proximal tibia involving predominately the a lateral tibial plateau and extending into the metaphysis. Joint effusion is noted. No definitive fibular fracture is seen. IMPRESSION: Comminuted proximal tibial fracture involving predominately the lateral tibial plateau. Electronically Signed   By: Alcide Clever  M.D.   On: 03/10/2016 10:50   Dg Knee Complete 4 Views Left  Result Date: 03/10/2016 CLINICAL DATA:  Fall today with left knee pain, initial encounter EXAM: LEFT KNEE - COMPLETE 4+ VIEW COMPARISON:  None. FINDINGS: Comminuted fracture the proximal tibia is again identified involving predominately the lateral tibial plateau. No definitive femoral abnormality is noted. A large joint effusion is seen. No other focal abnormality is noted. IMPRESSION: Comminuted lateral tibial plateau fracture. Electronically Signed   By: Alcide CleverMark  Lukens M.D.   On: 03/10/2016 10:51   11:30 PM Discussed case with Dr. Ophelia CharterYates, Orthopedics  Procedures Procedures (including critical care time)  Medications Ordered in ED Medications - No data to display   Initial Impression / Assessment and Plan / ED Course  I have reviewed the triage vital signs and the nursing notes.  Pertinent labs & imaging results that were available during my care of the patient were reviewed by me and considered in my medical decision making (see chart for details).  Clinical Course    Imaging as above. Discussed with Dr Ophelia CharterYates, orthopedic surgery. NPO. CT. Plan for surgery this afternoon.   Final Clinical Impressions(s) / ED Diagnoses     Final diagnoses:  Closed fracture of medial condyle of proximal tibia, left, initial encounter    New Prescriptions New Prescriptions   No medications on file    I personally preformed the services scribed in my presence. The recorded information has been reviewed is accurate. Raeford RazorStephen Mazzie Brodrick, MD.     Raeford RazorStephen Jamielee Mchale, MD 03/14/16 [phone]

## 2016-03-10 NOTE — Transfer of Care (Signed)
Immediate Anesthesia Transfer of Care Note  Patient: Sara Andrews  Procedure(s) Performed: Procedure(s): OPEN REDUCTION INTERNAL FIXATION (ORIF) BICONDYLAR TIBIAL PLATEAU FRACTURE (Left)  Patient Location: PACU  Anesthesia Type:General  Level of Consciousness: awake, alert , oriented and patient cooperative  Airway & Oxygen Therapy: Patient Spontanous Breathing and Patient connected to face mask oxygen  Post-op Assessment: Report given to RN and Post -op Vital signs reviewed and stable  Post vital signs: Reviewed and stable  Last Vitals:  Vitals:   03/10/16 1759 03/10/16 1800  BP: (!) 159/97   Pulse: (!) 110   Resp: 14   Temp:  36.6 C    Last Pain:  Vitals:   03/10/16 1800  TempSrc:   PainSc: 5          Complications: No apparent anesthesia complications

## 2016-03-10 NOTE — Interval H&P Note (Signed)
History and Physical Interval Note:  03/10/2016 3:57 PM  Sara Andrews  has presented today for surgery, with the diagnosis of Left Tibial Plateau Fracture  The various methods of treatment have been discussed with the patient and family. After consideration of risks, benefits and other options for treatment, the patient has consented to  Procedure(s): OPEN REDUCTION INTERNAL FIXATION (ORIF) TIBIAL PLATEAU (Left) as a surgical intervention .  The patient's history has been reviewed, patient examined, no change in status, stable for surgery.  I have reviewed the patient's chart and labs.  Questions were answered to the patient's satisfaction.     [name]

## 2016-03-10 NOTE — Anesthesia Procedure Notes (Signed)
Procedure Name: Intubation Date/Time: 03/10/2016 4:16 PM Performed by: Rosiland OzMEYERS, Elain Wixon Pre-anesthesia Checklist: Patient identified, Emergency Drugs available, Suction available, Patient being monitored and Timeout performed Patient Re-evaluated:Patient Re-evaluated prior to inductionOxygen Delivery Method: Circle system utilized Preoxygenation: Pre-oxygenation with 100% oxygen Intubation Type: IV induction Laryngoscope Size: Miller and 2 Grade View: Grade I Tube type: Oral Number of attempts: 1 Airway Equipment and Method: Stylet Placement Confirmation: ETT inserted through vocal cords under direct vision,  positive ETCO2 and breath sounds checked- equal and bilateral Secured at: 21 cm Dental Injury: Teeth and Oropharynx as per pre-operative assessment

## 2016-03-10 NOTE — Anesthesia Preprocedure Evaluation (Addendum)
Anesthesia Evaluation  Patient identified by MRN, date of birth, ID band Patient awake    Reviewed: Allergy & Precautions, NPO status , Patient's Chart, lab work & pertinent test results  History of Anesthesia Complications Negative for: history of anesthetic complications  Airway Mallampati: II  TM Distance: >3 FB Neck ROM: Full    Dental  (+) Edentulous Upper, Dental Advisory Given   Pulmonary asthma , COPD, former smoker,    Pulmonary exam normal        Cardiovascular negative cardio ROS Normal cardiovascular exam     Neuro/Psych PSYCHIATRIC DISORDERS Anxiety Depression negative neurological ROS     GI/Hepatic Neg liver ROS, GERD  ,  Endo/Other  diabetesHypothyroidism   Renal/GU negative Renal ROS     Musculoskeletal   Abdominal   Peds  Hematology   Anesthesia Other Findings   Reproductive/Obstetrics                            Anesthesia Physical Anesthesia Plan  ASA: III  Anesthesia Plan: General   Post-op Pain Management:    Induction: Intravenous  Airway Management Planned: Oral ETT  Additional Equipment:   Intra-op Plan:   Post-operative Plan: Extubation in OR  Informed Consent: I have reviewed the patients History and Physical, chart, labs and discussed the procedure including the risks, benefits and alternatives for the proposed anesthesia with the patient or authorized representative who has indicated his/her understanding and acceptance.   Dental advisory given  Plan Discussed with: CRNA, Anesthesiologist and Surgeon  Anesthesia Plan Comments:         Anesthesia Quick Evaluation

## 2016-03-10 NOTE — ED Notes (Signed)
Patient transported to X-ray 

## 2016-03-10 NOTE — Progress Notes (Signed)
Orthopedic Tech Progress Note Patient Details:  Sara Andrews 05-06-1956 [id number]  Ortho Devices Type of Ortho Device: Knee Immobilizer Ortho Device/Splint Location: lle Ortho Device/Splint Interventions: Application   Celena Lanius 03/10/2016, 11:54 AM

## 2016-03-10 NOTE — H&P (Signed)
Chief Complaint: Left tibial plateau fracture   History: 60 yo wf if being seen for the above complaint.  States that she was coming down from a ladder at work this morning when she fell from a height about a few feet landing on her left knee.  States that she was unable to weight bear after incident.  Marked pain.  Denies any other injuries.  xrays showed a comminuted left lateral tibial plateau fracture.   Past Medical History:  Diagnosis Date  . Anxiety   . COPD (chronic obstructive pulmonary disease) (HCC)   . Cough   . Depression   . Diabetes (HCC)   . Esophagitis, reflux   . Hyperlipidemia   . IBS (irritable bowel syndrome)   . Lumbago   . Muscle pain   . Osteoarthritis   . Sinus disorder   . Thyroid disease   . Uncomplicated herpes simplex     No Known Allergies  No current facility-administered medications on file prior to encounter.    Current Outpatient Prescriptions on File Prior to Encounter  Medication Sig Dispense Refill  . acyclovir (ZOVIRAX) 400 MG tablet Take 1 tablet (400 mg total) by mouth 3 (three) times daily. (Patient taking differently: Take 400 mg by mouth 3 (three) times daily as needed (for sores). ) 30 tablet 0  . albuterol (PROVENTIL) (2.5 MG/3ML) 0.083% nebulizer solution Take 3 mLs (2.5 mg total) by nebulization every 6 (six) hours as needed for wheezing or shortness of breath. 75 mL 12  . aspirin 81 MG EC tablet TAKE 1 TABLET BY MOUTH EVERY DAY (Patient taking differently: TAKE 81 MG BY MOUTH EVERY DAY) 30 tablet 11  . buPROPion (WELLBUTRIN XL) 150 MG 24 hr tablet Take 1 tablet (150 mg total) by mouth daily. 90 tablet 0  . Cyanocobalamin (VITAMIN B-12) 1000 MCG SUBL Place 1 tablet (1,000 mcg total) under the tongue daily. 30 tablet 0  . gabapentin (NEURONTIN) 300 MG capsule Take 1 capsule (300 mg total) by mouth 3 (three) times daily. 270 capsule 1  . Glycopyrrolate-Formoterol (BEVESPI AEROSPHERE) 9-4.8 MCG/ACT AERO Inhale 2 puffs into the lungs 2  (two) times daily. 10.7 g 2  . levothyroxine (SYNTHROID, LEVOTHROID) 88 MCG tablet Take 1 tablet (88 mcg total) by mouth daily before breakfast. 90 tablet 1  . montelukast (SINGULAIR) 10 MG tablet Take 1 tablet (10 mg total) by mouth daily. 90 tablet 1  . omeprazole (PRILOSEC) 40 MG capsule Take 1 capsule (40 mg total) by mouth daily. 90 capsule 1  . sertraline (ZOLOFT) 100 MG tablet Take 2 tablets (200 mg total) by mouth daily. 180 tablet 0  . simvastatin (ZOCOR) 40 MG tablet Take 1 tablet (40 mg total) by mouth at bedtime. 90 tablet 1  . Magnesium Oxide 400 MG CAPS Take 1 capsule (400 mg total) by mouth once. (Patient not taking: Reported on 03/10/2016) 30 capsule 5  . meloxicam (MOBIC) 15 MG tablet Take 1 tablet (15 mg total) by mouth daily. (Patient not taking: Reported on 03/10/2016) 90 tablet 1   ROS: Denies cardiac, pulmonary, GI, GU, Neuro issues.  No complaints of fever, chills.  Physical Exam: Vitals:   03/10/16 1200 03/10/16 1201  BP: 110/68   Pulse:  64  Resp:    Temp:     Head is normocephalic, atraumatic.   PERRL, EOMI. Cervical spine good ROM No respiratory distress.   Abdomen round nondistended.   Left knee some swelling.  Tender proximal tibia.  Calf nontender, NVI.  Skin warm and dry.   Image: Dg Tibia/fibula Left  Result Date: 03/10/2016 CLINICAL DATA:  Fall today with left knee pain and swelling, initial encounter EXAM: LEFT TIBIA AND FIBULA - 2 VIEW COMPARISON:  None. FINDINGS: There is a comminuted fracture the proximal tibia involving predominately the a lateral tibial plateau and extending into the metaphysis. Joint effusion is noted. No definitive fibular fracture is seen. IMPRESSION: Comminuted proximal tibial fracture involving predominately the lateral tibial plateau. Electronically Signed   By: Alcide CleverMark  Lukens M.D.   On: 03/10/2016 10:50   Dg Knee Complete 4 Views Left  Result Date: 03/10/2016 CLINICAL DATA:  Fall today with left knee pain, initial encounter  EXAM: LEFT KNEE - COMPLETE 4+ VIEW COMPARISON:  None. FINDINGS: Comminuted fracture the proximal tibia is again identified involving predominately the lateral tibial plateau. No definitive femoral abnormality is noted. A large joint effusion is seen. No other focal abnormality is noted. IMPRESSION: Comminuted lateral tibial plateau fracture. Electronically Signed   By: Alcide CleverMark  Lukens M.D.   On: 03/10/2016 10:51    Assessment: Left proximal tibial plateau fracture.   Plan.   Advised patient that treatment for this injury is ORIF.  Surgical procedure along with potential recovery time discussed.  All questions answered. Will plan for OR this afternoon.  Knee immobilizer on. Strict NWB.  NPO.   CT left knee ordered.  Patient was also seen by Dr Ophelia CharterYates.

## 2016-03-11 ENCOUNTER — Encounter (HOSPITAL_COMMUNITY): Payer: Self-pay | Admitting: Orthopaedic Surgery

## 2016-03-11 LAB — CBC
HEMATOCRIT: 36.4 % (ref 36.0–46.0)
HEMOGLOBIN: 11.1 g/dL — AB (ref 12.0–15.0)
MCH: 29.2 pg (ref 26.0–34.0)
MCHC: 30.5 g/dL (ref 30.0–36.0)
MCV: 95.8 fL (ref 78.0–100.0)
Platelets: 254 10*3/uL (ref 150–400)
RBC: 3.8 MIL/uL — ABNORMAL LOW (ref 3.87–5.11)
RDW: 14 % (ref 11.5–15.5)
WBC: 8.2 10*3/uL (ref 4.0–10.5)

## 2016-03-11 LAB — BASIC METABOLIC PANEL
ANION GAP: 5 (ref 5–15)
BUN: 11 mg/dL (ref 6–20)
CALCIUM: 8.8 mg/dL — AB (ref 8.9–10.3)
CO2: 28 mmol/L (ref 22–32)
Chloride: 104 mmol/L (ref 101–111)
Creatinine, Ser: 0.83 mg/dL (ref 0.44–1.00)
GFR calc Af Amer: >60 mL/min (ref >60)
GLUCOSE: 129 mg/dL — AB (ref 65–99)
POTASSIUM: 4.8 mmol/L (ref 3.5–5.1)
SODIUM: 137 mmol/L (ref 135–145)

## 2016-03-11 MED ORDER — KETOROLAC TROMETHAMINE 30 MG/ML IJ SOLN
30.0000 mg | Freq: Four times a day (QID) | INTRAMUSCULAR | Status: DC
  Administered 2016-03-11 – 2016-03-13 (×8): 30 mg via INTRAVENOUS
  Filled 2016-03-11 (×8): qty 1

## 2016-03-11 MED ORDER — SODIUM CHLORIDE 0.9 % IV BOLUS (SEPSIS)
500.0000 mL | Freq: Once | INTRAVENOUS | Status: AC
  Administered 2016-03-11: 500 mL via INTRAVENOUS

## 2016-03-11 NOTE — Evaluation (Signed)
Physical Therapy Evaluation Patient Details Name: Sara Andrews MRN: [id number] DOB: 12/14/1955 Today's Date: 03/11/2016   History of Present Illness  60 y.o.femalewho fell coming down a ladder at work resulting in tibial plateau fx, now s/p tibial pleateau ORIF. PMH: DM, lumbago, COPD, asthma.    Clinical Impression  Pt able to ambulate 15 feet with rw during initial PT session with distance limited by pt reports of fatigue and need for seated rest. Pt is currently unsure if she will return to her home or stay with her daughter upon D/C. Pt will need to attempt stairs with either location. Pt may need w/c for community access due to limited endurance. PT to continue to follow and progress as tolerated to maximize function and safety.     Follow Up Recommendations No PT follow up;Supervision for mobility/OOB    Equipment Recommendations  Rolling walker with 5" wheels (pediatric); Wheelchair ;Wheelchair cushion    Recommendations for Other Services       Precautions / Restrictions Precautions Precautions: Fall Required Braces or Orthoses: Knee Immobilizer - Left Knee Immobilizer - Left: On at all times Restrictions Weight Bearing Restrictions: Yes LLE Weight Bearing: Non weight bearing      Mobility  Bed Mobility Overal bed mobility: Needs Assistance Bed Mobility: Supine to Sit     Supine to sit: Min guard     General bed mobility comments: min guard for safety   Transfers Overall transfer level: Needs assistance Equipment used: Rolling walker (2 wheeled) Transfers: Sit to/from Stand Sit to Stand: Min guard         General transfer comment: guard for safety during transfer  Ambulation/Gait Ambulation/Gait assistance: Min guard Ambulation Distance (Feet): 15 Feet (plus 5 prior) Assistive device: Rolling walker (2 wheeled) Gait Pattern/deviations:  (swing-to pattern) Gait velocity: decreased   General Gait Details: Pt able to maintain NWB status  throughout session. Pt does fatigue quickly and distance is limited.   Stairs            Wheelchair Mobility    Modified Rankin (Stroke Patients Only)       Balance Overall balance assessment: Needs assistance Sitting-balance support: No upper extremity supported Sitting balance-Leahy Scale: Good     Standing balance support: Bilateral upper extremity supported Standing balance-Leahy Scale: Poor Standing balance comment: using rw for support.                              Pertinent Vitals/Pain Pain Assessment: Faces Faces Pain Scale: Hurts little more Pain Location: Lt knee Pain Descriptors / Indicators: Grimacing;Guarding Pain Intervention(s): Limited activity within patient's tolerance;Monitored during session    Home Living Family/patient expects to be discharged to:: Private residence Living Arrangements: Alone Available Help at Discharge: Family;Available 24 hours/day Type of Home: Mobile home Home Access: Stairs to enter Entrance Stairs-Rails: None Entrance Stairs-Number of Steps: 2 Home Layout: One level Home Equipment: None Additional Comments: Pt may stay at her daughter's home who also has 2 steps and no rail to enter.     Prior Function Level of Independence: Independent               Hand Dominance        Extremity/Trunk Assessment   Upper Extremity Assessment: Overall WFL for tasks assessed           Lower Extremity Assessment: LLE deficits/detail   LLE Deficits / Details: no assist needed to move LLE to EOB.  Communication   Communication: No difficulties  Cognition Arousal/Alertness: Lethargic Behavior During Therapy: Flat affect Overall Cognitive Status: Within Functional Limits for tasks assessed                      General Comments      Exercises        Assessment/Plan    PT Assessment Patient needs continued PT services  PT Diagnosis Difficulty walking   PT Problem List Decreased  strength;Decreased activity tolerance;Decreased balance;Decreased mobility  PT Treatment Interventions DME instruction;Gait training;Stair training;Functional mobility training;Therapeutic activities;Therapeutic exercise;Patient/family education   PT Goals (Current goals can be found in the Care Plan section) Acute Rehab PT Goals Patient Stated Goal: get back to being independent PT Goal Formulation: With patient Time For Goal Achievement: 03/25/16 Potential to Achieve Goals: Good    Frequency Min 5X/week   Barriers to discharge        Co-evaluation               End of Session Equipment Utilized During Treatment: Gait belt;Left knee immobilizer Activity Tolerance: Patient limited by fatigue Patient left: in chair;with call bell/phone within reach;with family/visitor present Nurse Communication: Weight bearing status;Mobility status         Time: 1100-1134 PT Time Calculation (min) (ACUTE ONLY): 34 min   Charges:   PT Evaluation $PT Eval Moderate Complexity: 1 Procedure PT Treatments $Gait Training: 8-22 mins   PT G Codes:        Christiane HaBenjamin J. Johnnathan Hagemeister, PT, CSCS Pager [phone] Office [phone]  03/11/2016, 12:50 PM

## 2016-03-11 NOTE — Progress Notes (Signed)
MD on call returned page. No interventions to be done at this time for blood pressure. Patient asymptomatic; will continue to monitor patient and notify if any changes.

## 2016-03-11 NOTE — Anesthesia Postprocedure Evaluation (Signed)
Anesthesia Post Note  Patient: Sara Andrews  Procedure(s) Performed: Procedure(s) (LRB): OPEN REDUCTION INTERNAL FIXATION (ORIF) BICONDYLAR TIBIAL PLATEAU FRACTURE (Left)  Patient location during evaluation: PACU Anesthesia Type: General Level of consciousness: sedated Pain management: pain level controlled Vital Signs Assessment: post-procedure vital signs reviewed and stable Respiratory status: spontaneous breathing and respiratory function stable Cardiovascular status: stable Anesthetic complications: no               Antonina Deziel DANIEL

## 2016-03-11 NOTE — Progress Notes (Signed)
Ordered Plexi Pulses from [employer]/materials management department.  Was told that Plexi Pulses should be ordered from the ortho tech.  Ortho tech, Bill, told me that Plexi Pulses come from materials management. Ordered SCD's.

## 2016-03-11 NOTE — Progress Notes (Signed)
Subjective: 1 Day Post-Op Procedure(s) (LRB): OPEN REDUCTION INTERNAL FIXATION (ORIF) BICONDYLAR TIBIAL PLATEAU FRACTURE (Left) Patient reports pain as severe.    Objective: Vital signs in last 24 hours: Temp:  [97.3 F (36.3 C)-98.5 F (36.9 C)] 98 F (36.7 C) (08/15 0536) Pulse Rate:  [58-110] 88 (08/15 0536) Resp:  [10-18] 10 (08/15 0536) BP: (97-159)/(40-99) 111/73 (08/15 0536) SpO2:  [94 %-100 %] 99 % (08/15 0536) Weight:  [77.6 kg (171 lb)-77.9 kg (171 lb 11.8 oz)] 77.9 kg (171 lb 11.8 oz) (08/14 1337)  Intake/Output from previous day: 08/14 0701 - 08/15 0700 In: 1806.3 [P.O.:240; I.V.:1566.3] Out: 950 [Urine:900; Blood:50] Intake/Output this shift: No intake/output data recorded.   Recent Labs  03/10/16 1139 03/11/16 0514  HGB 13.0 11.1*    Recent Labs  03/10/16 1139 03/11/16 0514  WBC 7.6 8.2  RBC 4.43 3.80*  HCT 42.1 36.4  PLT 248 254    Recent Labs  03/10/16 1139 03/11/16 0514  NA 139 137  K 4.3 4.8  CL 107 104  CO2 26 28  BUN 11 11  CREATININE 0.77 0.83  GLUCOSE 103* 129*  CALCIUM 9.5 8.8*    Recent Labs  03/10/16 1456  INR 0.98    Neurologically intact  Assessment/Plan: 1 Day Post-Op Procedure(s) (LRB): OPEN REDUCTION INTERNAL FIXATION (ORIF) BICONDYLAR TIBIAL PLATEAU FRACTURE (Left) Up with therapy.    Falls asleep when I stop talking to her.  toradol helped.  Taimi Towe C 03/11/2016, 7:56 AM

## 2016-03-11 NOTE — Progress Notes (Signed)
Patient blood pressure reading at 1430 was 79/31. MD Ophelia CharterYates called and notified. Ordered to give 500 cc bolus. BP follow up at 1726 read 92/27 on Dinamap. Rechecked by nurse manually and read 92/40. Will follow up with MD and continue to monitor.

## 2016-03-11 NOTE — Op Note (Signed)
NAMRober Minion:  Goyal, Jaydeen           ACCOUNT NO.:  [id number]  MEDICAL RECORD NO.:  [id number]  LOCATION:  [id number]                        FACILITY:  [facility]  PHYSICIAN:  Lilliann Rossetti C. Ophelia CharterYates, M.D.    DATE OF BIRTH:  1955/10/14  DATE OF PROCEDURE:  03/10/2016 DATE OF DISCHARGE:                              OPERATIVE REPORT   PREOPERATIVE DIAGNOSIS:  Left bicondylar tibial plateau fracture.  POSTOPERATIVE DIAGNOSIS:  Left bicondylar tibial plateau fracture.  PROCEDURE:  Open reduction and internal fixation of left closed bicondylar tibial plateau fracture and cancellous allograft bone chips.  SURGEON:  Annell GreeningMark Kendelle Schweers, M.D.  ASSISTANT:  Genene ChurnJames M. Barry Dieneswens, PA-C, medically necessary and present for the entire procedure.  ANESTHESIA:  Preoperative block plus general.  COMPLICATIONS:  None.  TOURNIQUET TIME:  Less than an hour.  IMPLANTS:  Biomet lateral tibial plateau anatomic plate.  INDICATIONS:  A 60 year old female, fell with a tibial plateau fracture extended in the intercondylar eminence and extended posteromedially, fracture line without displacement involving the medial plateau without intra-articular extension.  There was depression of the articular surface of the lateral tibial plateau and angulation.  DESCRIPTION OF PROCEDURE:  After preoperative block induction of anesthesia, Ancef prophylaxis, time-out procedure, prepping and draping with pervious stockinette, Coban, extremity sheets and drapes were applied.  Time-out procedure was completed.  Betadine, Steri-Drape applied.  An S-shaped incision was made.  Anterior compartment stripped off the lateral tibial plateau cortex.  Cortical window was made around starting with a drill hole and cancellous chips were rehydrated then packed, and under C-arm, visualization of the depressed tibial plateau was elevated back up even with the articular surface.  Once this was performed lateral tibial plateau plate was selected.  The  proximal compression screw was placed first due to the widening of the tibial plateau.  The compression screw went through the medial cortex and was later exchanged near the end of the case.  Distal portion was fixed with bicortical screws that were 34 mm in length, nonlocking.  Additional compression screws were used.  Plate set slightly off the flange of the cortex.  All screws were in good position.  The posterior screw came close to the articular surface.  It was removed and a nonlocking screw was used, angled slightly more inferior to make sure it did not penetrate near the tibial eminence.  AP and lateral fluoroscopic pictures were taken.  It was looked under fluoro and rotated.  There was good stability.  Collateral ligaments were stable.  The patient had a positive Lachman's consistent with tibial eminence fracture.  Irrigation with saline solution and tourniquet deflated.  Loose reapproximation of the anterior compartment fascia with 2 sutures pulled the muscle back over the bone and plate. Subcutaneous tissue reapproximated with 2-0 Vicryl, skin staple closure, postop dressing, knee immobilizer was applied.  The patient tolerated the procedure well.  Distal pulses were intact.  She was neurologically intact with soft compartments at the end of the case.     Merilynn Haydu C. Ophelia CharterYates, M.D.     MCY/MEDQ  D:  03/11/2016  T:  03/11/2016  Job:  [id number]

## 2016-03-11 NOTE — Progress Notes (Signed)
EKG performed. Results complete in epic reading Normal Sinus Rhythm.

## 2016-03-12 LAB — BASIC METABOLIC PANEL
ANION GAP: 5 (ref 5–15)
BUN: 11 mg/dL (ref 6–20)
CHLORIDE: 106 mmol/L (ref 101–111)
CO2: 27 mmol/L (ref 22–32)
Calcium: 8.6 mg/dL — ABNORMAL LOW (ref 8.9–10.3)
Creatinine, Ser: 0.8 mg/dL (ref 0.44–1.00)
GFR calc non Af Amer: >60 mL/min (ref >60)
GLUCOSE: 141 mg/dL — AB (ref 65–99)
POTASSIUM: 4 mmol/L (ref 3.5–5.1)
Sodium: 138 mmol/L (ref 135–145)

## 2016-03-12 MED ORDER — OXYCODONE-ACETAMINOPHEN 7.5-325 MG PO TABS: 1.0000 | ORAL_TABLET | Freq: Four times a day (QID) | ORAL | 0 refills | Status: DC | PRN

## 2016-03-12 MED ORDER — BISACODYL 10 MG RE SUPP
10.0000 mg | Freq: Once | RECTAL | Status: AC
  Administered 2016-03-12: 10 mg via RECTAL
  Filled 2016-03-12: qty 1

## 2016-03-12 MED ORDER — ASPIRIN EC 325 MG PO TBEC: 325.0000 mg | DELAYED_RELEASE_TABLET | Freq: Every day | ORAL | 0 refills | Status: DC

## 2016-03-12 NOTE — Progress Notes (Signed)
Subjective: 2 Days Post-Op Procedure(s) (LRB): OPEN REDUCTION INTERNAL FIXATION (ORIF) BICONDYLAR TIBIAL PLATEAU FRACTURE (Left) Patient reports pain as moderate.  No BM since Monday. Some abdominal tenderness. Pos flatus. Objective: Vital signs in last 24 hours: Temp:  [98.4 F (36.9 C)-99.2 F (37.3 C)] 99.2 F (37.3 C) (08/16 0528) Pulse Rate:  [85-96] 91 (08/16 0528) Resp:  [17] 17 (08/16 0528) BP: (79-103)/(27-72) 91/53 (08/16 0528) SpO2:  [96 %-98 %] 96 % (08/16 0528)  Intake/Output from previous day: 08/15 0701 - 08/16 0700 In: 3055 [P.O.:420; I.V.:2635] Out: 1300 [Urine:1300] Intake/Output this shift: No intake/output data recorded.   Recent Labs  03/10/16 1139 03/11/16 0514  HGB 13.0 11.1*    Recent Labs  03/10/16 1139 03/11/16 0514  WBC 7.6 8.2  RBC 4.43 3.80*  HCT 42.1 36.4  PLT 248 254    Recent Labs  03/11/16 0514 03/12/16 0519  NA 137 138  K 4.8 4.0  CL 104 106  CO2 28 27  BUN 11 11  CREATININE 0.83 0.80  GLUCOSE 129* 141*  CALCIUM 8.8* 8.6*    Recent Labs  03/10/16 1456  INR 0.98    Neurologically intact  Assessment/Plan: 2 Days Post-Op Procedure(s) (LRB): OPEN REDUCTION INTERNAL FIXATION (ORIF) BICONDYLAR TIBIAL PLATEAU FRACTURE (Left) Up with therapy Dulcolax supp.    Possible home this afternoon if she does stairs. Has 2 steps to get in house.  Trigo Winterbottom C 03/12/2016, 8:12 AM

## 2016-03-12 NOTE — Progress Notes (Addendum)
Physical Therapy Treatment Patient Details Name: Sara Andrews MRN: [id number] DOB: Dec 17, 1955 Today's Date: 03/12/2016    History of Present Illness 60 y.o.femalewho fell coming down a ladder at work resulting in tibial plateau fx, now s/p tibial pleateau ORIF. PMH: DM, lumbago, COPD, asthma.      PT Comments    Pt making gradual progress with mobility but continues to fatigue quickly. Ambulation distance limited to 20 feet before requiring a seated rest. Pt may require use of w/c for household and community mobility. Able to attempt stairs today, pt able to complete a single step but unable to get up second. Discussion had with pt and family regarding options and various techniques on how to perform. PT to continue to follow and focus on steps at next session. Daughter present and supportive throughout session.   Follow Up Recommendations  No PT follow up;Supervision for mobility/OOB     Equipment Recommendations  Rolling walker with 5" wheels (pediatric height);Wheelchair with Lt elevating leg rest; Wheelchair cushion;    Recommendations for Other Services       Precautions / Restrictions Precautions Precautions: Fall Required Braces or Orthoses: Knee Immobilizer - Left Knee Immobilizer - Left: On at all times Restrictions Weight Bearing Restrictions: Yes LLE Weight Bearing: Non weight bearing    Mobility  Bed Mobility Overal bed mobility: Needs Assistance Bed Mobility: Supine to Sit     Supine to sit: Supervision     General bed mobility comments: using rail to assist  Transfers Overall transfer level: Needs assistance Equipment used: Rolling walker (2 wheeled) Transfers: Sit to/from Stand Sit to Stand: Min guard         General transfer comment: guard for safety during transfer  Ambulation/Gait Ambulation/Gait assistance: Min guard Ambulation Distance (Feet): 20 Feet Assistive device: Rolling walker (2 wheeled) Gait Pattern/deviations:  (hop-to  pattern) Gait velocity: decreased   General Gait Details: pt consistent with NWB status, fatigues quickly and requires a seated rest.    Stairs Stairs: Yes Stairs assistance: Min assist Stair Management: Backwards;No rails;With walker Number of Stairs: 1 General stair comments: Pt able to complete single step but unable to get up second step.   Wheelchair Mobility    Modified Rankin (Stroke Patients Only)       Balance Overall balance assessment: Needs assistance Sitting-balance support: No upper extremity supported Sitting balance-Leahy Scale: Good     Standing balance support: Bilateral upper extremity supported Standing balance-Leahy Scale: Poor Standing balance comment: using rw                    Cognition Arousal/Alertness: Awake/alert Behavior During Therapy: WFL for tasks assessed/performed Overall Cognitive Status: Within Functional Limits for tasks assessed                      Exercises      General Comments        Pertinent Vitals/Pain Pain Assessment: 0-10 Pain Score: 5  Pain Location: Lt leg Pain Descriptors / Indicators: Aching Pain Intervention(s): Limited activity within patient's tolerance;Monitored during session    Home Living Family/patient expects to be discharged to:: Private residence Living Arrangements: Alone Available Help at Discharge: Family;Available 24 hours/day Type of Home: Mobile home Home Access: Stairs to enter Entrance Stairs-Rails: None Home Layout: One level Home Equipment: None Additional Comments: Pt may stay at her daughter's home who also has 2 steps and no rail to enter.     Prior Function Level of Independence: Independent  PT Goals (current goals can now be found in the care plan section) Acute Rehab PT Goals Patient Stated Goal: be independent PT Goal Formulation: With patient Time For Goal Achievement: 03/25/16 Potential to Achieve Goals: Good Progress towards PT goals:  Progressing toward goals    Frequency  Min 5X/week    PT Plan Current plan remains appropriate    Co-evaluation             End of Session Equipment Utilized During Treatment: Gait belt;Left knee immobilizer Activity Tolerance: Patient limited by fatigue Patient left: in chair;with call bell/phone within reach;with family/visitor present     Time: [id number] PT Time Calculation (min) (ACUTE ONLY): 40 min  Charges:  $Gait Training: 38-52 mins                    G Codes:      Christiane HaBenjamin J. Kile Kabler, PT, CSCS Pager [phone] Office [phone]  03/12/2016, 3:30 PM

## 2016-03-12 NOTE — Evaluation (Signed)
Occupational Therapy Evaluation Patient Details Name: Sara Andrews MRN: [id number] DOB: 1955-12-29 Today's Date: 03/12/2016    History of Present Illness 60 y.o.femalewho fell coming down a ladder at work resulting in tibial plateau fx, now s/p tibial pleateau ORIF. PMH: DM, lumbago, COPD, asthma.     Clinical Impression   Patient presenting with decreased I in self care, balance, functional transfers, and safety. Patient was I  PTA. Patient currently functioning min - mod A. Patient will benefit from acute OT to increase overall independence in the areas of ADLs, functional mobility, and safety in order to safely discharge home with family.    Follow Up Recommendations  No OT follow up;Supervision - Intermittent    Equipment Recommendations  Tub/shower seat    Recommendations for Other Services       Precautions / Restrictions Precautions Precautions: Fall Required Braces or Orthoses: Knee Immobilizer - Left Knee Immobilizer - Left: On at all times Restrictions Weight Bearing Restrictions: Yes LLE Weight Bearing: Non weight bearing      Mobility Bed Mobility               General bed mobility comments: in recliner chair upon entering the room  Transfers Overall transfer level: Needs assistance Equipment used: Rolling walker (2 wheeled) Transfers: Sit to/from Stand Sit to Stand: Min guard              Balance Overall balance assessment: Needs assistance Sitting-balance support: No upper extremity supported Sitting balance-Leahy Scale: Good     Standing balance support: Bilateral upper extremity supported Standing balance-Leahy Scale: Poor Standing balance comment: Rw for support                            ADL Overall ADL's : Needs assistance/impaired     Grooming: Set up;Sitting   Upper Body Bathing: Set up;Sitting   Lower Body Bathing: Sit to/from stand;Minimal assistance   Upper Body Dressing : Set up;Sitting   Lower  Body Dressing: Moderate assistance   Toilet Transfer: Minimal assistance;Regular Toilet;RW   Toileting- Clothing Manipulation and Hygiene: Minimal assistance;Sit to/from stand                         Pertinent Vitals/Pain Pain Assessment: 0-10 Pain Score: 4  Pain Location: L knee Pain Descriptors / Indicators: Sore Pain Intervention(s): Monitored during session;Repositioned     Hand Dominance Right   Extremity/Trunk Assessment Upper Extremity Assessment Upper Extremity Assessment: Overall WFL for tasks assessed   Lower Extremity Assessment Lower Extremity Assessment: LLE deficits/detail   Cervical / Trunk Assessment Cervical / Trunk Assessment: Normal   Communication Communication Communication: No difficulties   Cognition Arousal/Alertness: Lethargic Behavior During Therapy: WFL for tasks assessed/performed Overall Cognitive Status: Within Functional Limits for tasks assessed                                Home Living Family/patient expects to be discharged to:: Private residence Living Arrangements: Alone Available Help at Discharge: Family;Available 24 hours/day Type of Home: Mobile home Home Access: Stairs to enter Entrance Stairs-Number of Steps: 2 Entrance Stairs-Rails: None Home Layout: One level     Bathroom Shower/Tub: Producer, television/film/videoWalk-in shower   Bathroom Toilet: Standard     Home Equipment: None   Additional Comments: Pt may stay at her daughter's home who also has 2 steps and no rail to  enter.       Prior Functioning/Environment Level of Independence: Independent             OT Diagnosis: Acute pain   OT Problem List: Decreased strength;Decreased activity tolerance;Impaired balance (sitting and/or standing);Decreased safety awareness;Pain;Decreased knowledge of precautions;Decreased knowledge of use of DME or AE   OT Treatment/Interventions: Self-care/ADL training;Therapeutic exercise;Energy conservation;DME and/or AE  instruction;Cognitive remediation/compensation;Therapeutic activities;Patient/family education    OT Goals(Current goals can be found in the care plan section) Acute Rehab OT Goals Patient Stated Goal: "to do what I was doing for myself before" OT Goal Formulation: With patient Time For Goal Achievement: 03/26/16 Potential to Achieve Goals: Good ADL Goals Pt Will Perform Upper Body Bathing: with modified independence Pt Will Perform Lower Body Bathing: with modified independence;with adaptive equipment Pt Will Perform Upper Body Dressing: with modified independence Pt Will Perform Lower Body Dressing: with modified independence;with adaptive equipment Pt Will Transfer to Toilet: with modified independence;regular height toilet Pt Will Perform Toileting - Clothing Manipulation and hygiene: with modified independence;sit to/from stand  OT Frequency: Min 2X/week   Barriers to D/C:    none known at this time          End of Session Equipment Utilized During Treatment: Rolling walker  Activity Tolerance: Patient tolerated treatment well Patient left: in chair;with call bell/phone within reach   Time: 1336-1350 OT Time Calculation (min): 14 min Charges:  OT General Charges $OT Visit: 1 Procedure OT Evaluation $OT Eval Moderate Complexity: 1 Procedure G-Codes:    Pittman, Antwan Pandya L, MS, OTR/L 03/12/2016, 2:30 PM

## 2016-03-13 LAB — BASIC METABOLIC PANEL
ANION GAP: 4 — AB (ref 5–15)
BUN: 6 mg/dL (ref 6–20)
CHLORIDE: 108 mmol/L (ref 101–111)
CO2: 28 mmol/L (ref 22–32)
CREATININE: 0.84 mg/dL (ref 0.44–1.00)
Calcium: 8.6 mg/dL — ABNORMAL LOW (ref 8.9–10.3)
GFR calc non Af Amer: >60 mL/min (ref >60)
Glucose, Bld: 126 mg/dL — ABNORMAL HIGH (ref 65–99)
POTASSIUM: 4.1 mmol/L (ref 3.5–5.1)
SODIUM: 140 mmol/L (ref 135–145)

## 2016-03-13 NOTE — Progress Notes (Signed)
OT Cancellation Note  Patient Details Name: Sara Andrews MRN: [id number] DOB: 03-25-1956   Cancelled Treatment:    Reason Eval/Treat Not Completed: Other (comment). Nurse in with pt going over d/c instructions. All DME had been delivered to pt's room and pt states that she has ADL A/E at home and will have assist from family  Galen ManilaSpencer, Jarret Torre Jeanette 03/13/2016, 12:35 PM

## 2016-03-13 NOTE — Progress Notes (Signed)
Pt discharged home in stable condition. Walker and wheelchair provided. Discharge education given with both pt and her family voicing understanding the instructions.

## 2016-03-13 NOTE — Care Management Note (Signed)
Case Management Note  Patient Details  Name: Sara Andrews MRN: [id number] Date of Birth: 1955/09/24  Subjective/Objective: 60 y.o F fell off ladder at [employer] where she works and sustained  Software engineerTibial PlateauFracture . PT/OT eval recommending NO Home therapies but did order DME as follows: W/C with cushion and L ELR, RW (ped HT) as pt is 4'9" and tub Bench. CM made rep with [facility] aware of all of these as pt is prepared for discharge today. Attempted to contact Workers Comp Rep Lanora Manislizabeth with [employer] at [employer] @ [phone] but she is out of the office today and no one else there does her job in her absence.                    Action/Plan: Anticipate discharge home today. No further CM needs but will be available should additional discharge needs arise.   Expected Discharge Date:                  Expected Discharge Plan:  Home/Self Care  In-House Referral:  NA  Discharge planning Services  CM Consult  Post Acute Care Choice:  Durable Medical Equipment Choice offered to:  Patient  DME Arranged:  Walker rolling, Wheelchair manual, Tub bench DME Agency:  [facility]  HH Arranged:  NA HH Agency:  NA  Status of Service:  Completed, signed off  If discussed at Long Length of Stay Meetings, dates discussed:    Additional Comments:  Sara Andrews, Sara Shetterly M, RN 03/13/2016, 11:02 AM

## 2016-03-13 NOTE — Progress Notes (Signed)
Called Dr Marlene BastYates's office to notify them that the order for the wheelchair needs to be signed before [facility] can bring the equipment

## 2016-03-13 NOTE — Progress Notes (Signed)
Physical Therapy Treatment Patient Details Name: Sara Andrews MRN: [id number] DOB: 02-Mar-1956 Today's Date: 03/13/2016    History of Present Illness 60 y.o.femalewho fell coming down a ladder at work resulting in tibial plateau fx, now s/p tibial pleateau ORIF. PMH: DM, lumbago, COPD, asthma.      PT Comments    Pt presented sitting OOB in recliner when PT entered room. Pt's daughter was present throughout session. Pt reported that the case manager and her doctor approved of her getting a rental w/c when she d/c's home. Pt and daughter stated that they would like to use the w/c to manage the stairs at home and that they felt comfortable with that. PT answered all questions from pt and pt's daughter. Pt able to ambulate 50 ft with RW and min guard for safety while maintaining NWB L LE status. She fatigued quickly and required frequent standing rest breaks. Pt would continue to benefit from skilled physical therapy services at this time while admitted to address her limitations in order to improve her overall safety and independence with functional mobility.    Follow Up Recommendations  No PT follow up;Supervision for mobility/OOB     Equipment Recommendations  Rolling walker with 5" wheels;Other (comment);Wheelchair (measurements PT);Wheelchair cushion (measurements PT) (w/c with elevating leg rests)    Recommendations for Other Services       Precautions / Restrictions Precautions Precautions: Fall Required Braces or Orthoses: Knee Immobilizer - Left Knee Immobilizer - Left: On at all times Restrictions Weight Bearing Restrictions: Yes LLE Weight Bearing: Non weight bearing    Mobility  Bed Mobility               General bed mobility comments: pt sitting OOB in recliner when PT entered room  Transfers Overall transfer level: Needs assistance Equipment used: Rolling walker (2 wheeled) Transfers: Sit to/from Stand Sit to Stand: Min guard         General  transfer comment: guard for safety during transfer  Ambulation/Gait Ambulation/Gait assistance: Min guard Ambulation Distance (Feet): 50 Feet Assistive device: Rolling walker (2 wheeled) Gait Pattern/deviations: Step-to pattern (hop-to pattern to maintain NWB L LE status) Gait velocity: decreased Gait velocity interpretation: Below normal speed for age/gender General Gait Details: pt consistent with NWB status, fatigues quickly and required frequent standing rest breaks   Stairs            Wheelchair Mobility    Modified Rankin (Stroke Patients Only)       Balance Overall balance assessment: Needs assistance Sitting-balance support: Feet supported;No upper extremity supported Sitting balance-Leahy Scale: Good     Standing balance support: During functional activity;Bilateral upper extremity supported Standing balance-Leahy Scale: Poor Standing balance comment: pt reliant on RW for support                    Cognition Arousal/Alertness: Awake/alert Behavior During Therapy: WFL for tasks assessed/performed Overall Cognitive Status: Within Functional Limits for tasks assessed                      Exercises      General Comments        Pertinent Vitals/Pain Pain Assessment: No/denies pain Pain Intervention(s): Monitored during session    Home Living                      Prior Function            PT Goals (current goals can now  be found in the care plan section) Acute Rehab PT Goals Patient Stated Goal: be independent PT Goal Formulation: With patient Time For Goal Achievement: 03/25/16 Potential to Achieve Goals: Good Progress towards PT goals: Progressing toward goals    Frequency  Min 5X/week    PT Plan Current plan remains appropriate    Co-evaluation             End of Session Equipment Utilized During Treatment: Gait belt;Left knee immobilizer Activity Tolerance: Patient limited by fatigue Patient left: in  chair;with call bell/phone within reach;with family/visitor present     Time: [id number] PT Time Calculation (min) (ACUTE ONLY): 16 min  Charges:  $Gait Training: 8-22 mins                    G CodesAlessandra Bevels:      Kamoni Gentles M Dmarion Perfect 03/13/2016, 10:51 AM Deborah ChalkJennifer Jasimine Simms, PT, DPT [phone]

## 2016-03-27 NOTE — Discharge Summary (Signed)
Patient ID: Sara Andrews MRN: [id number] DOB/AGE: November 10, 1955 60 y.o.  Admit date: 03/10/2016 Discharge date: 03/27/2016  Admission Diagnoses:  Active Problems:   Closed fracture of lateral portion of left tibial plateau   Discharge Diagnoses:  Active Problems:   Closed fracture of lateral portion of left tibial plateau  status post Procedure(s): OPEN REDUCTION INTERNAL FIXATION (ORIF) BICONDYLAR TIBIAL PLATEAU FRACTURE  Past Medical History:  Diagnosis Date  . Anxiety   . Arthritis    "hips, hands, back" (03/10/2016)  . Asthma   . Chronic bronchitis (HCC)   . COPD (chronic obstructive pulmonary disease) (HCC)   . Cough   . Depression   . Esophagitis, reflux   . GERD (gastroesophageal reflux disease)   . Hyperlipidemia   . Hypothyroidism   . IBS (irritable bowel syndrome)   . Lumbago   . Muscle pain   . Osteoarthritis   . Sinus disorder   . Thyroid disease   . Type 2 diabetes, diet controlled (HCC)   . Uncomplicated herpes simplex     Surgeries: Procedure(s): OPEN REDUCTION INTERNAL FIXATION (ORIF) BICONDYLAR TIBIAL PLATEAU FRACTURE on 03/10/2016   Consultants: Treatment Team:  Eldred Manges, MD  Discharged Condition: Improved  Hospital Course: Sara Andrews is an 60 y.o. female who was admitted 03/10/2016 for operative treatment of tibial plateau fracture. Patient failed conservative treatments (please see the history and physical for the specifics) and had severe unremitting pain that affects sleep, daily activities and work/hobbies. After pre-op clearance, the patient was taken to the operating room on 03/10/2016 and underwent  Procedure(s): OPEN REDUCTION INTERNAL FIXATION (ORIF) BICONDYLAR TIBIAL PLATEAU FRACTURE.    Patient was given perioperative antibiotics:  Anti-infectives    Start     Dose/Rate Route Frequency Ordered Stop   03/11/16 0000  ceFAZolin (ANCEF) IVPB 1 g/50 mL premix     1 g 100 mL/hr over 30 Minutes Intravenous Every 8 hours  03/10/16 1840 03/11/16 0658       Patient was given sequential compression devices and early ambulation to prevent DVT.   Patient benefited maximally from hospital stay and there were no complications. At the time of discharge, the patient was urinating/moving their bowels without difficulty, tolerating a regular diet, pain is controlled with oral pain medications and they have been cleared by PT/OT.   Recent vital signs: No data found.    Recent laboratory studies: No results for input(s): WBC, HGB, HCT, PLT, NA, K, CL, CO2, BUN, CREATININE, GLUCOSE, INR, CALCIUM in the last 72 hours.  Invalid input(s): PT, 2   Discharge Medications:     Medication List    STOP taking these medications   meloxicam 15 MG tablet Commonly known as:  MOBIC     TAKE these medications   acyclovir 400 MG tablet Commonly known as:  ZOVIRAX Take 1 tablet (400 mg total) by mouth 3 (three) times daily. What changed:  when to take this  reasons to take this   albuterol (2.5 MG/3ML) 0.083% nebulizer solution Commonly known as:  PROVENTIL Take 3 mLs (2.5 mg total) by nebulization every 6 (six) hours as needed for wheezing or shortness of breath.   aspirin EC 325 MG tablet Take 1 tablet (325 mg total) by mouth daily. What changed:  See the new instructions.   buPROPion 150 MG 24 hr tablet Commonly known as:  WELLBUTRIN XL Take 1 tablet (150 mg total) by mouth daily.   gabapentin 300 MG capsule Commonly known as:  NEURONTIN Take 1 capsule (300 mg total) by mouth 3 (three) times daily.   Glycopyrrolate-Formoterol 9-4.8 MCG/ACT Aero Commonly known as:  BEVESPI AEROSPHERE Inhale 2 puffs into the lungs 2 (two) times daily.   levothyroxine 88 MCG tablet Commonly known as:  SYNTHROID, LEVOTHROID Take 1 tablet (88 mcg total) by mouth daily before breakfast.   Magnesium Oxide 400 MG Caps Take 1 capsule (400 mg total) by mouth once.   metaxalone 800 MG tablet Commonly known as:  SKELAXIN Take  800 mg by mouth daily as needed for muscle spasms.   montelukast 10 MG tablet Commonly known as:  SINGULAIR Take 1 tablet (10 mg total) by mouth daily.   omeprazole 40 MG capsule Commonly known as:  PRILOSEC Take 1 capsule (40 mg total) by mouth daily.   oxyCODONE-acetaminophen 7.5-325 MG tablet Commonly known as:  PERCOCET Take 1-2 tablets by mouth every 6 (six) hours as needed for severe pain.   sertraline 100 MG tablet Commonly known as:  ZOLOFT Take 2 tablets (200 mg total) by mouth daily.   simvastatin 40 MG tablet Commonly known as:  ZOCOR Take 1 tablet (40 mg total) by mouth at bedtime.   Vitamin B-12 1000 MCG Subl Place 1 tablet (1,000 mcg total) under the tongue daily.   VITAMIN D PO Take 1 capsule by mouth daily.       Diagnostic Studies: Dg Tibia/fibula Left  Result Date: 03/10/2016 CLINICAL DATA:  Fall today with left knee pain and swelling, initial encounter EXAM: LEFT TIBIA AND FIBULA - 2 VIEW COMPARISON:  None. FINDINGS: There is a comminuted fracture the proximal tibia involving predominately the a lateral tibial plateau and extending into the metaphysis. Joint effusion is noted. No definitive fibular fracture is seen. IMPRESSION: Comminuted proximal tibial fracture involving predominately the lateral tibial plateau. Electronically Signed   By: Alcide Clever M.D.   On: 03/10/2016 10:50   Ct Knee Left Wo Contrast  Result Date: 03/10/2016 CLINICAL DATA:  Status post fall. Left lateral tibial plateau fracture. EXAM: CT OF THE LEFT KNEE WITHOUT CONTRAST TECHNIQUE: Multidetector CT imaging of the LEFT knee was performed according to the standard protocol. Multiplanar CT image reconstructions were also generated. COMPARISON:  None. FINDINGS: Bones/Joint/Cartilage Severely comminuted left lateral tibial plateau fracture with 7 mm of depression of the articular surface of the lateral tibial plateau and 10 mm of distraction. Comminution of the medial and lateral tibial  eminence. Nondisplaced fracture of the medial tibial plateau involving the articular surface. Oblique fracture extending from the left lateral tibial plateau towards the posterior proximal tibial metaphysis. No other fracture or dislocation.  Large lipohemarthrosis. No lytic or sclerotic osseous lesion. Ligaments Suboptimally assessed by CT. Muscles and Tendons Normal muscles. No muscle atrophy. No intramuscular fluid collection or hematoma. Soft tissues No soft tissue mass.  No fluid collection or hematoma. IMPRESSION: 1. Bicondylar left tibial plateau fracture, more severely involving the lateral tibial plateau. Findings are concerning for a Schatzker V type fracture. 2. Large lipohemarthrosis. Electronically Signed   By: Elige Ko   On: 03/10/2016 13:09   Dg Knee Complete 4 Views Left  Result Date: 03/10/2016 CLINICAL DATA:  Bicondylar tibial plateau fracture. EXAM: LEFT KNEE - COMPLETE 4+ VIEW; DG C-ARM 61-120 MIN COMPARISON:  Multiple priors. FINDINGS: Open reduction internal fixation has been performed. Intraoperative spot films demonstrate improved position and alignment status post plate and screw stabilization of the bicondylar tibial plateau fracture. IMPRESSION: As above. Electronically Signed   By: Jackquline Denmark  Curnes M.D.   On: 03/10/2016 17:48   Dg Knee Complete 4 Views Left  Result Date: 03/10/2016 CLINICAL DATA:  Fall today with left knee pain, initial encounter EXAM: LEFT KNEE - COMPLETE 4+ VIEW COMPARISON:  None. FINDINGS: Comminuted fracture the proximal tibia is again identified involving predominately the lateral tibial plateau. No definitive femoral abnormality is noted. A large joint effusion is seen. No other focal abnormality is noted. IMPRESSION: Comminuted lateral tibial plateau fracture. Electronically Signed   By: Alcide CleverMark  Lukens M.D.   On: 03/10/2016 10:51   Dg C-arm 1-60 Min  Result Date: 03/10/2016 CLINICAL DATA:  Bicondylar tibial plateau fracture. EXAM: LEFT KNEE - COMPLETE 4+  VIEW; DG C-ARM 61-120 MIN COMPARISON:  Multiple priors. FINDINGS: Open reduction internal fixation has been performed. Intraoperative spot films demonstrate improved position and alignment status post plate and screw stabilization of the bicondylar tibial plateau fracture. IMPRESSION: As above. Electronically Signed   By: Elsie StainJohn T Curnes M.D.   On: 03/10/2016 17:48    Discharge Instructions    Call MD / Call 911    Complete by:  As directed   If you experience chest pain or shortness of breath, CALL 911 and be transported to the hospital emergency room.  If you develope a fever above 101 F, pus (white drainage) or increased drainage or redness at the wound, or calf pain, call your surgeon's office.   Constipation Prevention    Complete by:  As directed   Drink plenty of fluids.  Prune juice may be helpful.  You may use a stool softener, such as Colace (over the counter) 100 mg twice a day.  Use MiraLax (over the counter) for constipation as needed.   Diet - low sodium heart healthy    Complete by:  As directed   Discharge instructions    Complete by:  As directed   Do not remove dressing or get wet.  Strict non-weight bearing left leg.   Knee immobilizer on at all times.  Do not bend knee.  Use ice off and on as needed.   Driving restrictions    Complete by:  As directed   No driving   Increase activity slowly as tolerated    Complete by:  As directed   Lifting restrictions    Complete by:  As directed   No lifting      Follow-up Information    Eldred MangesMark C Yates, MD. Schedule an appointment as soon as possible for a visit in 6 day(s).   Specialty:  Orthopedic Surgery Why:  need return office visit one week.  Contact information: [address] [place] [address] [phone]           Discharge Plan:  discharge to home  Disposition:     Signed: Naida SleightOWENS,Jyren Cerasoli M  For mark yates md 03/27/2016, 10:05 AM

## 2016-04-01 ENCOUNTER — Other Ambulatory Visit: Payer: Self-pay | Admitting: Family Medicine

## 2016-04-28 ENCOUNTER — Other Ambulatory Visit: Payer: Self-pay | Admitting: Family Medicine

## 2016-04-28 DIAGNOSIS — F331 Major depressive disorder, recurrent, moderate: Secondary | ICD-10-CM

## 2016-04-29 ENCOUNTER — Other Ambulatory Visit: Payer: Self-pay | Admitting: Family Medicine

## 2016-04-29 ENCOUNTER — Encounter (INDEPENDENT_AMBULATORY_CARE_PROVIDER_SITE_OTHER): Payer: Self-pay

## 2016-04-29 ENCOUNTER — Ambulatory Visit (INDEPENDENT_AMBULATORY_CARE_PROVIDER_SITE_OTHER): Payer: Worker's Compensation | Admitting: Orthopaedic Surgery

## 2016-04-29 DIAGNOSIS — S82142D Displaced bicondylar fracture of left tibia, subsequent encounter for closed fracture with routine healing: Secondary | ICD-10-CM

## 2016-04-29 DIAGNOSIS — F331 Major depressive disorder, recurrent, moderate: Secondary | ICD-10-CM

## 2016-04-29 NOTE — Telephone Encounter (Signed)
Patient requesting refill of Bupropion to [facility].  

## 2016-05-06 ENCOUNTER — Other Ambulatory Visit: Payer: Self-pay

## 2016-05-06 DIAGNOSIS — F331 Major depressive disorder, recurrent, moderate: Secondary | ICD-10-CM

## 2016-05-06 MED ORDER — BUPROPION HCL ER (XL) 150 MG PO TB24: 150.0000 mg | ORAL_TABLET | Freq: Every day | ORAL | 0 refills | Status: DC

## 2016-05-06 NOTE — Telephone Encounter (Signed)
Patient requesting refill of Bupropion XL.

## 2016-05-09 ENCOUNTER — Other Ambulatory Visit: Payer: Self-pay | Admitting: Family Medicine

## 2016-05-09 DIAGNOSIS — F331 Major depressive disorder, recurrent, moderate: Secondary | ICD-10-CM

## 2016-05-09 DIAGNOSIS — B001 Herpesviral vesicular dermatitis: Secondary | ICD-10-CM

## 2016-05-09 NOTE — Telephone Encounter (Signed)
Patient requesting refill of Zoloft to [facility].

## 2016-05-13 ENCOUNTER — Other Ambulatory Visit: Payer: Self-pay | Admitting: Family Medicine

## 2016-05-14 ENCOUNTER — Other Ambulatory Visit: Payer: Self-pay

## 2016-05-14 DIAGNOSIS — B001 Herpesviral vesicular dermatitis: Secondary | ICD-10-CM

## 2016-05-14 MED ORDER — ASPIRIN 81 MG PO TBEC: 81.0000 mg | DELAYED_RELEASE_TABLET | Freq: Once | ORAL | 1 refills | Status: AC

## 2016-05-14 MED ORDER — ACYCLOVIR 400 MG PO TABS: 400.0000 mg | ORAL_TABLET | Freq: Every day | ORAL | 0 refills | Status: DC

## 2016-05-14 NOTE — Telephone Encounter (Signed)
Patient requesting refill of Aspirin 81 mg and Acyclovir to [facility]. Patient is also requesting a 90 day supply of both medications.

## 2016-05-27 ENCOUNTER — Ambulatory Visit (INDEPENDENT_AMBULATORY_CARE_PROVIDER_SITE_OTHER): Payer: Self-pay | Admitting: Orthopaedic Surgery

## 2016-05-30 NOTE — Telephone Encounter (Signed)
Patient's daughter Trecia Rogers(misti) called advised her mother is still in a lot of pain since her surgery 03/10/16. She advised the back of her heel and ankle is swollen. She said the heel and ankle is burning and throbbing.  The number to contact her is [phone]

## 2016-06-02 ENCOUNTER — Ambulatory Visit (INDEPENDENT_AMBULATORY_CARE_PROVIDER_SITE_OTHER): Payer: Worker's Compensation

## 2016-06-02 ENCOUNTER — Encounter (INDEPENDENT_AMBULATORY_CARE_PROVIDER_SITE_OTHER): Payer: Self-pay | Admitting: Orthopaedic Surgery

## 2016-06-02 ENCOUNTER — Ambulatory Visit (INDEPENDENT_AMBULATORY_CARE_PROVIDER_SITE_OTHER): Payer: Worker's Compensation | Admitting: Orthopaedic Surgery

## 2016-06-02 ENCOUNTER — Ambulatory Visit (INDEPENDENT_AMBULATORY_CARE_PROVIDER_SITE_OTHER): Payer: Self-pay

## 2016-06-02 VITALS — BP 126/73 | HR 78 | Ht 60.0 in | Wt 175.0 lb

## 2016-06-02 DIAGNOSIS — M25562 Pain in left knee: Secondary | ICD-10-CM | POA: Diagnosis not present

## 2016-06-02 DIAGNOSIS — G8929 Other chronic pain: Secondary | ICD-10-CM

## 2016-06-02 DIAGNOSIS — M79672 Pain in left foot: Secondary | ICD-10-CM | POA: Diagnosis not present

## 2016-06-02 MED ORDER — DICLOFENAC SODIUM 1 % TD GEL: 4.0000 g | Freq: Two times a day (BID) | TRANSDERMAL | Status: DC

## 2016-06-02 NOTE — Progress Notes (Signed)
Office Visit Note   Patient: Sara Andrews           Date of Birth: 10/22/1955           MRN: [id number] Visit Date: 06/02/2016              Requested by: Dennison MascotLemont Morrisey, MD [address] [place], [place] [address] PCP: Dennison MascotLemont Morrisey, MD   Assessment & Plan: Visit Diagnoses:  1. Chronic pain of left knee   2. Pain of left heel     Plan: We will apply a Swede-O ankle brace that may help with some of her ankle symptoms. X-rays were negative for calcaneus fracture. Voltaren gel prescribed that she can apply twice a day to affected areas. Components of the  lateral tibial plateau as well as adjacent to the Achilles tendon. She can continue ice continue 50% weightbearing. Work slip given no work 1 month. I will recheck her in 3 weeks. We discussed the grading that she has underneath and its associated with a severely comminuted tibial plateau fracture with multiple fragments. She understands that she may require total knee arthroplasty.  Follow-Up Instructions: Return in about 3 weeks (around 06/23/2016).   Orders:  Orders Placed This Encounter  Procedures  . XR Foot 2 Views Left  . XR Knee 1-2 Views Left   Meds ordered this encounter  Medications  . diclofenac sodium (VOLTAREN) 1 % transdermal gel 4 g      Procedures: No procedures performed   Clinical Data: No additional findings.   Subjective: Chief Complaint  Patient presents with  . Left Knee - Follow-up, Fracture    [name] is 12 weeks status post ORIF Left Bicondylar tibial plateau fracture cancellous autograft, bone chips. She states that it is still giving her a lot of pain. There is a knot that is on the lateral side that she feels is bigger than it should be. Also states that her heel has been swelling up on her lately.  Patient states in the last 1-2 weeks she's had increased pain on each side of the Achilles tendon adjacent to the calcaneus. Pain is worse with weightbearing. She's been  a mature with a normal heel toe gait with percent weightbearing. With therapy she notes some swelling in her knee with exercises. She's been using some bands to work on stretching of her heel cord.  Review of Systems review of systems is unchanged updated 14 point other than as above   Objective: Vital Signs: BP 126/73 (BP Location: Left Arm, Patient Position: Sitting)   Pulse 78   Ht 5' (1.524 m)   Wt 175 lb (79.4 kg)   BMI 34.18 kg/m   Physical Exam  Constitutional: She is oriented to person, place, and time. She appears well-developed.  HENT:  Head: Normocephalic.  Right Ear: External ear normal.  Left Ear: External ear normal.  Eyes: Pupils are equal, round, and reactive to light.  Neck: No tracheal deviation present. No thyromegaly present.  Cardiovascular: Normal rate.   Pulmonary/Chest: Effort normal.  Abdominal: Soft.  Neurological: She is alert and oriented to person, place, and time.  Skin: Skin is warm and dry.  Psychiatric: She has a normal mood and affect. Her behavior is normal.    Ortho Exam patient's Amer tore 50% weightbearing with a walker with the hand brake and reverse C. She has a tenderness and withdraws with palpation with swelling on each side of the Achilles tendon insertion in the calcaneus. Plantar  fascial origin over the plantar surface of the calcaneus is only slightly tender. She complains of pain with palpation about the knee. Plate and screws are palpable on the lateral tibial plateau as expected.  Specialty Comments:  No specialty comments available.  Imaging: No results found.   PMFS History: Patient Active Problem List   Diagnosis Date Noted  . Pain of left heel 06/02/2016  . Closed fracture of lateral portion of left tibial plateau 03/10/2016  . Degenerative cervical disc 03/04/2016  . Gastroesophageal reflux disease without esophagitis 02/01/2016  . Primary osteoarthritis of right hand 02/01/2016  . Paresthesias in left hand  02/01/2016  . SOB (shortness of breath) 10/05/2013  . COPD (chronic obstructive pulmonary disease) (HCC) 10/05/2013  . History of smoking 10/05/2013  . Hyperlipidemia 10/05/2013  . Hypothyroidism 10/05/2013   Past Medical History:  Diagnosis Date  . Anxiety   . Arthritis    "hips, hands, back" (03/10/2016)  . Asthma   . Chronic bronchitis (HCC)   . COPD (chronic obstructive pulmonary disease) (HCC)   . Cough   . Depression   . Esophagitis, reflux   . GERD (gastroesophageal reflux disease)   . Hyperlipidemia   . Hypothyroidism   . IBS (irritable bowel syndrome)   . Lumbago   . Muscle pain   . Osteoarthritis   . Sinus disorder   . Thyroid disease   . Type 2 diabetes, diet controlled (HCC)   . Uncomplicated herpes simplex     Family History  Problem Relation Age of Onset  . Hypertension Daughter   . Diabetes Father   . Heart disease Father   . Breast cancer Maternal Aunt 60  . Breast cancer Paternal Aunt 7360  . Depression Sister     Past Surgical History:  Procedure Laterality Date  . ABDOMINAL HYSTERECTOMY    . CARPAL TUNNEL RELEASE Right   . ORIF TIBIA PLATEAU Left 03/10/2016   Procedure: OPEN REDUCTION INTERNAL FIXATION (ORIF) BICONDYLAR TIBIAL PLATEAU FRACTURE;  Surgeon: Eldred MangesMark C Adilee Lemme, MD;  Location: [facility] OR;  Service: Orthopedics;  Laterality: Left;  . SHOULDER ARTHROSCOPY W/ ROTATOR CUFF REPAIR Right   . TONSILLECTOMY AND ADENOIDECTOMY     Social History   Occupational History  . Not on file.   Social History Main Topics  . Smoking status: Former Smoker    Packs/day: 2.00    Years: 30.00    Quit date: 09/03/2008  . Smokeless tobacco: Never Used  . Alcohol use No  . Drug use: No  . Sexual activity: No

## 2016-06-05 ENCOUNTER — Telehealth (INDEPENDENT_AMBULATORY_CARE_PROVIDER_SITE_OTHER): Payer: Self-pay | Admitting: Orthopaedic Surgery

## 2016-06-05 DIAGNOSIS — G8929 Other chronic pain: Secondary | ICD-10-CM

## 2016-06-05 DIAGNOSIS — M25562 Pain in left knee: Principal | ICD-10-CM

## 2016-06-05 MED ORDER — DICLOFENAC SODIUM 1 % TD GEL: 4.0000 g | Freq: Two times a day (BID) | TRANSDERMAL | 1 refills | Status: DC | PRN

## 2016-06-05 NOTE — Telephone Encounter (Signed)
Script entered into computer and called to pharmacy. Mistie advised.

## 2016-06-05 NOTE — Telephone Encounter (Signed)
Called to pharmacy 

## 2016-06-05 NOTE — Telephone Encounter (Signed)
Patient's daughter called stating that an RX diclofenac sodium (VOLTAREN) 1 % transdermal gel 4 gel for her leg was not sent to the pharmacy.  [facility] [place]  Contact Info: [phone]

## 2016-06-10 ENCOUNTER — Telehealth (INDEPENDENT_AMBULATORY_CARE_PROVIDER_SITE_OTHER): Payer: Self-pay

## 2016-06-10 NOTE — Telephone Encounter (Signed)
Work comp adjustor is requesting the 06/02/16 office note be faxed to her @ [phone]. Faxed and pt to the attn of claim#[id number]

## 2016-06-11 ENCOUNTER — Ambulatory Visit (INDEPENDENT_AMBULATORY_CARE_PROVIDER_SITE_OTHER): Payer: Self-pay | Admitting: Orthopaedic Surgery

## 2016-06-25 ENCOUNTER — Ambulatory Visit (INDEPENDENT_AMBULATORY_CARE_PROVIDER_SITE_OTHER): Payer: Worker's Compensation | Admitting: Orthopaedic Surgery

## 2016-06-25 ENCOUNTER — Ambulatory Visit (INDEPENDENT_AMBULATORY_CARE_PROVIDER_SITE_OTHER): Payer: Worker's Compensation

## 2016-06-25 ENCOUNTER — Encounter (INDEPENDENT_AMBULATORY_CARE_PROVIDER_SITE_OTHER): Payer: Self-pay | Admitting: Orthopaedic Surgery

## 2016-06-25 VITALS — Ht 60.0 in | Wt 179.0 lb

## 2016-06-25 DIAGNOSIS — M7662 Achilles tendinitis, left leg: Secondary | ICD-10-CM

## 2016-06-25 DIAGNOSIS — G8929 Other chronic pain: Secondary | ICD-10-CM | POA: Diagnosis not present

## 2016-06-25 DIAGNOSIS — Z09 Encounter for follow-up examination after completed treatment for conditions other than malignant neoplasm: Secondary | ICD-10-CM

## 2016-06-25 DIAGNOSIS — M25562 Pain in left knee: Secondary | ICD-10-CM | POA: Diagnosis not present

## 2016-06-25 MED ORDER — LIDOCAINE HCL 1 % IJ SOLN
3.0000 mL | INTRAMUSCULAR | Status: AC | PRN
  Administered 2016-06-25: 3 mL

## 2016-06-25 MED ORDER — METHYLPREDNISOLONE ACETATE 40 MG/ML IJ SUSP
40.0000 mg | INTRAMUSCULAR | Status: AC | PRN
  Administered 2016-06-25: 40 mg via INTRA_ARTICULAR

## 2016-06-25 NOTE — Progress Notes (Signed)
Office Visit Note   Patient: Sara Andrews           Date of Birth: 11-Jan-1956           MRN: [id number] Visit Date: 06/25/2016              Requested by: Judeen HammansMeredith Key Soles, MD [address] [place], [place] [address] PCP: Dennison MascotLemont Morrisey, MD   Assessment & Plan: Visit Diagnoses:  1. Postop check   2. Left Achilles bursitis   3. Chronic pain of left knee     Plan: The try to help give patient some relief of left knee pain injection. Tolerated procedure well without provocation. Follow-up in the office in 4 weeks for recheck. He will continue out of work. Can start weightbearing as tolerated. Start formal physical therapy for left knee gentle range of motion and strengthening.  Follow-Up Instructions: Return in about 4 weeks (around 07/23/2016).   Orders:  Orders Placed This Encounter  Procedures  . Large Joint Injection/Arthrocentesis  . XR Tibia/Fibula Left   No orders of the defined types were placed in this encounter.     Procedures: Large Joint Inj Date/Time: 06/25/2016 12:45 PM Performed by: Naida SleightWENS, Chellie Vanlue M Authorized by: Naida SleightWENS, Braelynne Garinger M   Consent Given by:  Patient Timeout: prior to procedure the correct patient, procedure, and site was verified   Indications:  Pain Location:  Knee Site:  L knee Needle Size:  25 G Needle Length:  1.5 inches Approach:  Anterolateral Ultrasound Guidance: No   Fluoroscopic Guidance: No   Arthrogram: No   Medications:  3 mL lidocaine 1 %; 40 mg methylPREDNISolone acetate 40 MG/ML Aspiration Attempted: No       Clinical Data: No additional findings.   Subjective: No chief complaint on file.   Patient is here for three week recheck. She had ORIF Left Bicondylar tibial plateau fracture on 03/10/2016. She continues to have pain in her knee. She also notices that she has pain in her back and legs after standing for any length of time. She has been trying to take Aleve for the pain, which does not help at all.  She is unable to rest at night due to the pain. She still has some burning on the left side of her ankle. She is using the Voltaren Gel with little relief. She continues to have the burning down into her tibia. She continues to be out of work.    Review of Systems  HENT: Negative.   Respiratory: Negative.   Cardiovascular: Negative.   Gastrointestinal: Negative.   Genitourinary: Negative.   Musculoskeletal: Positive for joint swelling.  Neurological: Negative.   Psychiatric/Behavioral: Negative.      Objective: Vital Signs: Ht 5' (1.524 m)   Wt 179 lb (81.2 kg)   BMI 34.96 kg/m   Physical Exam  Constitutional: She is oriented to person, place, and time. No distress.  HENT:  Head: Normocephalic and atraumatic.  Eyes: Pupils are equal, round, and reactive to light.  Neck: Normal range of motion.  Pulmonary/Chest: No respiratory distress.  Abdominal: She exhibits no distension.  Neurological: She is alert and oriented to person, place, and time.  Skin: Skin is warm and dry.  Psychiatric: She has a normal mood and affect.    Ortho Exam Gait is still antalgic. Left knee range of motion about 0-90. Joint line tender. Some swelling without large effusions. Left ankle she does have some decreased range of motion. Moderately tender over the  peroneal tendon and also over the pre-Achilles bursa. Specialty Comments:  No specialty comments available.  Imaging: Xr Tibia/fibula Left  Result Date: 06/25/2016 X-ray left tib-fib today shows that her hardware is intact. Good healing at the fracture site. Impression status post ORIF bicondylar tibial plateau fracture. Good healing. Hardware intact.    PMFS History: Patient Active Problem List   Diagnosis Date Noted  . Pain of left heel 06/02/2016  . Closed fracture of lateral portion of left tibial plateau 03/10/2016  . Degenerative cervical disc 03/04/2016  . Gastroesophageal reflux disease without esophagitis 02/01/2016  .  Primary osteoarthritis of right hand 02/01/2016  . Paresthesias in left hand 02/01/2016  . SOB (shortness of breath) 10/05/2013  . COPD (chronic obstructive pulmonary disease) (HCC) 10/05/2013  . History of smoking 10/05/2013  . Hyperlipidemia 10/05/2013  . Hypothyroidism 10/05/2013   Past Medical History:  Diagnosis Date  . Anxiety   . Arthritis    "hips, hands, back" (03/10/2016)  . Asthma   . Chronic bronchitis (HCC)   . COPD (chronic obstructive pulmonary disease) (HCC)   . Cough   . Depression   . Esophagitis, reflux   . GERD (gastroesophageal reflux disease)   . Hyperlipidemia   . Hypothyroidism   . IBS (irritable bowel syndrome)   . Lumbago   . Muscle pain   . Osteoarthritis   . Sinus disorder   . Thyroid disease   . Type 2 diabetes, diet controlled (HCC)   . Uncomplicated herpes simplex     Family History  Problem Relation Age of Onset  . Hypertension Daughter   . Diabetes Father   . Heart disease Father   . Breast cancer Maternal Aunt 60  . Breast cancer Paternal Aunt 4660  . Depression Sister     Past Surgical History:  Procedure Laterality Date  . ABDOMINAL HYSTERECTOMY    . CARPAL TUNNEL RELEASE Right   . ORIF TIBIA PLATEAU Left 03/10/2016   Procedure: OPEN REDUCTION INTERNAL FIXATION (ORIF) BICONDYLAR TIBIAL PLATEAU FRACTURE;  Surgeon: Eldred MangesMark C Yates, MD;  Location: [facility] OR;  Service: Orthopedics;  Laterality: Left;  . SHOULDER ARTHROSCOPY W/ ROTATOR CUFF REPAIR Right   . TONSILLECTOMY AND ADENOIDECTOMY     Social History   Occupational History  . Not on file.   Social History Main Topics  . Smoking status: Former Smoker    Packs/day: 2.00    Years: 30.00    Quit date: 09/03/2008  . Smokeless tobacco: Never Used  . Alcohol use No  . Drug use: No  . Sexual activity: No

## 2016-06-25 NOTE — Patient Instructions (Signed)
Okay to begin weightbearing as tolerated. Start formal physical therapy for left knee gentle range of motion and strengthening.

## 2016-07-01 ENCOUNTER — Telehealth (INDEPENDENT_AMBULATORY_CARE_PROVIDER_SITE_OTHER): Payer: Self-pay

## 2016-07-01 NOTE — Telephone Encounter (Signed)
Voice mail was left requesting work note from the 06/25/16 visit be faxed to her which I did

## 2016-07-08 ENCOUNTER — Other Ambulatory Visit: Payer: Self-pay | Admitting: Family Medicine

## 2016-07-08 DIAGNOSIS — B001 Herpesviral vesicular dermatitis: Secondary | ICD-10-CM

## 2016-07-24 ENCOUNTER — Telehealth (INDEPENDENT_AMBULATORY_CARE_PROVIDER_SITE_OTHER): Payer: Self-pay | Admitting: Orthopaedic Surgery

## 2016-07-24 NOTE — Telephone Encounter (Signed)
Ok for note until ROV thanks

## 2016-07-24 NOTE — Telephone Encounter (Signed)
Ok to continue out of work until after follow up appt?

## 2016-07-25 ENCOUNTER — Other Ambulatory Visit: Payer: Self-pay | Admitting: Family Medicine

## 2016-07-25 DIAGNOSIS — J3089 Other allergic rhinitis: Secondary | ICD-10-CM

## 2016-07-25 DIAGNOSIS — M19041 Primary osteoarthritis, right hand: Secondary | ICD-10-CM

## 2016-07-25 NOTE — Telephone Encounter (Signed)
There is a work note taking keeping patient out of work until 08/05/2016 in chart. [facility] ?  Or tell me where to find the info to be able to send it?   Thanks.

## 2016-07-25 NOTE — Telephone Encounter (Signed)
Patient requesting refill of Singular and Meloxicam to [facility].

## 2016-08-05 ENCOUNTER — Ambulatory Visit (INDEPENDENT_AMBULATORY_CARE_PROVIDER_SITE_OTHER): Payer: Worker's Compensation | Admitting: Orthopaedic Surgery

## 2016-08-05 ENCOUNTER — Encounter (INDEPENDENT_AMBULATORY_CARE_PROVIDER_SITE_OTHER): Payer: Self-pay

## 2016-08-05 ENCOUNTER — Ambulatory Visit (INDEPENDENT_AMBULATORY_CARE_PROVIDER_SITE_OTHER): Payer: Self-pay

## 2016-08-05 ENCOUNTER — Encounter (INDEPENDENT_AMBULATORY_CARE_PROVIDER_SITE_OTHER): Payer: Self-pay | Admitting: Orthopaedic Surgery

## 2016-08-05 VITALS — BP 115/84 | HR 115 | Ht 60.0 in | Wt 179.0 lb

## 2016-08-05 DIAGNOSIS — M545 Low back pain, unspecified: Secondary | ICD-10-CM

## 2016-08-05 DIAGNOSIS — S82122D Displaced fracture of lateral condyle of left tibia, subsequent encounter for closed fracture with routine healing: Secondary | ICD-10-CM | POA: Diagnosis not present

## 2016-08-05 NOTE — Progress Notes (Signed)
Office Visit Note   Patient: Sara Andrews           Date of Birth: 02-Feb-1956           MRN: [id number] Visit Date: 08/05/2016              Requested by: Dennison Mascot, MD No address on file PCP: Dennison Mascot, MD   Assessment & Plan: Visit Diagnoses:  1. Acute bilateral low back pain without sciatica   2. Closed fracture of lateral portion of left tibial plateau with routine healing, subsequent encounter     Plan: Patient's been having sharp back pain has trouble sleeping, and has trouble getting comfortable, has trouble walking in stores. She does better when she leans over grocery cart she's been to physical therapy for greater than a month with persistent symptoms. She has back pain pain that radiates into both eyes. She denies any associated bowel or bladder symptoms. We'll proceed with the MRI scan lumbar. Previous radiographs a few years ago demonstrated bilateral L5 pars defects without spondylolisthesis and without significant disc space narrowing.  Follow-Up Instructions: No Follow-up on file.   Orders:  No orders of the defined types were placed in this encounter.  No orders of the defined types were placed in this encounter.     Procedures: No procedures performed   Clinical Data: No additional findings.   Subjective: Chief Complaint  Patient presents with  . Left Knee - Follow-up    Patient returns for four week follow up. She is status post ORIF Left Bicondylar tibial plateau fracture 03/10/2016. She had a left knee injection on 06/25/216 with no relief. She is continued out of work and physical therapy. She states that she has been having these pains that come up in the backs of her legs, both sides, and into her back that "paralyze" her. These seem to come up after she has been up for about 30 minutes or up at the grocery store. She usually leaves there in tears. She states that she has pain at night as well. She is doing exercises for her back  that were given to her by PT but they aren't helping. She is also using aleve and ibuprofen with no relief.    Review of Systems   Objective: Vital Signs: BP 115/84   Pulse (!) 115   Ht 5' (1.524 m)   Wt 179 lb (81.2 kg)   BMI 34.96 kg/m   Physical Exam  Constitutional: She is oriented to person, place, and time. She appears well-developed.  HENT:  Head: Normocephalic.  Right Ear: External ear normal.  Left Ear: External ear normal.  Eyes: Pupils are equal, round, and reactive to light.  Neck: No tracheal deviation present. No thyromegaly present.  Cardiovascular: Normal rate.   Pulmonary/Chest: Effort normal.  Abdominal: Soft.  Musculoskeletal:  Patient has negative straight leg raising 90. Well-healed the incision from ORIF lateral tibial plateau comminuted. She has some prominence of the plate. Standing x-rays show possibly 1 or 2 mm joint space narrowing no significant osteophyte formation. Her fracture appears to be healed. She is ambulatory without her crutches or walker at this point. She is limited in standing as well as going to stores.  Neurological: She is alert and oriented to person, place, and time.  Skin: Skin is warm and dry.  Psychiatric: She has a normal mood and affect. Her behavior is normal.    Ortho Exam anterior tib EHL is strong. No gastrocsoleus atrophy.  Distal pulses are 2+ and symmetrical. Negative nerve root tension signs. Minimal trochanteric bursal tenderness.  Specialty Comments:  No specialty comments available.  Imaging: No results found.   PMFS History: Patient Active Problem List   Diagnosis Date Noted  . Pain of left heel 06/02/2016  . Closed fracture of lateral portion of left tibial plateau 03/10/2016  . Degenerative cervical disc 03/04/2016  . Gastroesophageal reflux disease without esophagitis 02/01/2016  . Primary osteoarthritis of right hand 02/01/2016  . Paresthesias in left hand 02/01/2016  . SOB (shortness of breath)  10/05/2013  . COPD (chronic obstructive pulmonary disease) (HCC) 10/05/2013  . History of smoking 10/05/2013  . Hyperlipidemia 10/05/2013  . Hypothyroidism 10/05/2013   Past Medical History:  Diagnosis Date  . Anxiety   . Arthritis    "hips, hands, back" (03/10/2016)  . Asthma   . Chronic bronchitis (HCC)   . COPD (chronic obstructive pulmonary disease) (HCC)   . Cough   . Depression   . Esophagitis, reflux   . GERD (gastroesophageal reflux disease)   . Hyperlipidemia   . Hypothyroidism   . IBS (irritable bowel syndrome)   . Lumbago   . Muscle pain   . Osteoarthritis   . Sinus disorder   . Thyroid disease   . Type 2 diabetes, diet controlled (HCC)   . Uncomplicated herpes simplex     Family History  Problem Relation Age of Onset  . Hypertension Daughter   . Diabetes Father   . Heart disease Father   . Breast cancer Maternal Aunt 60  . Breast cancer Paternal Aunt 160  . Depression Sister     Past Surgical History:  Procedure Laterality Date  . ABDOMINAL HYSTERECTOMY    . CARPAL TUNNEL RELEASE Right   . ORIF TIBIA PLATEAU Left 03/10/2016   Procedure: OPEN REDUCTION INTERNAL FIXATION (ORIF) BICONDYLAR TIBIAL PLATEAU FRACTURE;  Surgeon: Eldred MangesMark C Violanda Bobeck, MD;  Location: [facility] OR;  Service: Orthopedics;  Laterality: Left;  . SHOULDER ARTHROSCOPY W/ ROTATOR CUFF REPAIR Right   . TONSILLECTOMY AND ADENOIDECTOMY     Social History   Occupational History  . Not on file.   Social History Main Topics  . Smoking status: Former Smoker    Packs/day: 2.00    Years: 30.00    Quit date: 09/03/2008  . Smokeless tobacco: Never Used  . Alcohol use No  . Drug use: No  . Sexual activity: No

## 2016-08-07 ENCOUNTER — Encounter: Payer: Self-pay | Admitting: Family Medicine

## 2016-08-07 ENCOUNTER — Ambulatory Visit (INDEPENDENT_AMBULATORY_CARE_PROVIDER_SITE_OTHER): Payer: PRIVATE HEALTH INSURANCE | Admitting: Family Medicine

## 2016-08-07 ENCOUNTER — Telehealth (INDEPENDENT_AMBULATORY_CARE_PROVIDER_SITE_OTHER): Payer: Self-pay | Admitting: Orthopaedic Surgery

## 2016-08-07 VITALS — BP 120/68 | HR 102 | Temp 98.0°F | Resp 16 | Ht 60.0 in | Wt 185.0 lb

## 2016-08-07 DIAGNOSIS — E7849 Other hyperlipidemia: Secondary | ICD-10-CM

## 2016-08-07 DIAGNOSIS — J441 Chronic obstructive pulmonary disease with (acute) exacerbation: Secondary | ICD-10-CM | POA: Diagnosis not present

## 2016-08-07 DIAGNOSIS — J3089 Other allergic rhinitis: Secondary | ICD-10-CM | POA: Diagnosis not present

## 2016-08-07 DIAGNOSIS — E038 Other specified hypothyroidism: Secondary | ICD-10-CM | POA: Diagnosis not present

## 2016-08-07 DIAGNOSIS — E538 Deficiency of other specified B group vitamins: Secondary | ICD-10-CM | POA: Diagnosis not present

## 2016-08-07 DIAGNOSIS — J449 Chronic obstructive pulmonary disease, unspecified: Secondary | ICD-10-CM

## 2016-08-07 DIAGNOSIS — R739 Hyperglycemia, unspecified: Secondary | ICD-10-CM | POA: Insufficient documentation

## 2016-08-07 DIAGNOSIS — F331 Major depressive disorder, recurrent, moderate: Secondary | ICD-10-CM | POA: Diagnosis not present

## 2016-08-07 DIAGNOSIS — E784 Other hyperlipidemia: Secondary | ICD-10-CM

## 2016-08-07 LAB — TSH: TSH: 4.21 m[IU]/L

## 2016-08-07 MED ORDER — SERTRALINE HCL 100 MG PO TABS: ORAL_TABLET | ORAL | 1 refills | Status: DC

## 2016-08-07 MED ORDER — GLYCOPYRROLATE-FORMOTEROL 9-4.8 MCG/ACT IN AERO: 2.0000 | INHALATION_SPRAY | Freq: Two times a day (BID) | RESPIRATORY_TRACT | 2 refills | Status: DC

## 2016-08-07 MED ORDER — BENZONATATE 100 MG PO CAPS: 100.0000 mg | ORAL_CAPSULE | Freq: Three times a day (TID) | ORAL | 0 refills | Status: DC | PRN

## 2016-08-07 MED ORDER — MONTELUKAST SODIUM 10 MG PO TABS: 10.0000 mg | ORAL_TABLET | Freq: Every day | ORAL | 1 refills | Status: DC

## 2016-08-07 MED ORDER — BUPROPION HCL ER (XL) 150 MG PO TB24: 150.0000 mg | ORAL_TABLET | Freq: Every day | ORAL | 1 refills | Status: DC

## 2016-08-07 MED ORDER — PREDNISONE 20 MG PO TABS: 20.0000 mg | ORAL_TABLET | Freq: Every day | ORAL | 0 refills | Status: DC

## 2016-08-07 NOTE — Progress Notes (Signed)
Name: Sara Andrews   MRN: [id number]    DOB: 02/25/1956   Date:08/07/2016       Progress Note  Subjective  Chief Complaint  Chief Complaint  Patient presents with  . Cough    pt thinks its bronchitis  . Shortness of Breath  . Medication Refill    HPI  Major Depression: long history of depression and taking Zoloft since her husband died in 12-22-2006. She is taking 200 mg daily and added to Wellbutrin XL back Summer of 2017 , she lives by herself. She states that she is feeling tired, no energy, difficulty sleeping at night, no suicidal thoughts or ideation.  Denies suicidal thoughts or ideation. Advised referral to psychiatrist but she refuses at this time, still not in remission  Hypothyroidism:due for labs, she has chronic dry skin, but denies constipation. She is always tired, but no changes  COPD: she has been using Bevespi daily, only uses albuterol prn when she has a flare, she quit smoking in 12-22-2007. She has chronic SOB, but since a recent cold last week, she has noticed worsening of cough, that is productive, wheezing and some SOB over the past few days.  No fever. She has been using albuterol nebulization, but is tired of coughing.   B12 deficiency: she is not taking supplementation, she is having tingling on hands again  Patient Active Problem List   Diagnosis Date Noted  . Hyperglycemia 08/07/2016  . Moderate episode of recurrent major depressive disorder (HCC) 08/07/2016  . Pain of left heel 06/02/2016  . Closed fracture of lateral portion of left tibial plateau 03/10/2016  . Degenerative cervical disc 03/04/2016  . Gastroesophageal reflux disease without esophagitis 02/01/2016  . Primary osteoarthritis of right hand 02/01/2016  . Paresthesias in left hand 02/01/2016  . SOB (shortness of breath) 10/05/2013  . COPD (chronic obstructive pulmonary disease) (HCC) 10/05/2013  . History of smoking 10/05/2013  . Hyperlipidemia 10/05/2013  . Hypothyroidism 10/05/2013     Past Surgical History:  Procedure Laterality Date  . ABDOMINAL HYSTERECTOMY    . CARPAL TUNNEL RELEASE Right   . ORIF TIBIA PLATEAU Left 03/10/2016   Procedure: OPEN REDUCTION INTERNAL FIXATION (ORIF) BICONDYLAR TIBIAL PLATEAU FRACTURE;  Surgeon: Eldred Manges, MD;  Location: [facility] OR;  Service: Orthopedics;  Laterality: Left;  . SHOULDER ARTHROSCOPY W/ ROTATOR CUFF REPAIR Right   . TONSILLECTOMY AND ADENOIDECTOMY      Family History  Problem Relation Age of Onset  . Hypertension Daughter   . Diabetes Father   . Heart disease Father   . Breast cancer Maternal Aunt 60  . Breast cancer Paternal Aunt 35  . Depression Sister     Social History   Social History  . Marital status: Widowed    Spouse name: N/A  . Number of children: N/A  . Years of education: N/A   Occupational History  . Not on file.   Social History Main Topics  . Smoking status: Former Smoker    Packs/day: 2.00    Years: 30.00    Quit date: 09/03/2008  . Smokeless tobacco: Never Used  . Alcohol use No  . Drug use: No  . Sexual activity: No   Other Topics Concern  . Not on file   Social History Narrative  . No narrative on file     Current Outpatient Prescriptions:  .  acyclovir (ZOVIRAX) 400 MG tablet, TAKE 1 TABLET(400 MG) BY MOUTH DAILY, Disp: 30 tablet, Rfl: 0 .  albuterol (PROVENTIL) (  2.5 MG/3ML) 0.083% nebulizer solution, Take 3 mLs (2.5 mg total) by nebulization every 6 (six) hours as needed for wheezing or shortness of breath., Disp: 75 mL, Rfl: 12 .  ASPIRIN LOW DOSE 81 MG EC tablet, TK 1 T PO QD, Disp: , Rfl: 0 .  benzonatate (TESSALON) 100 MG capsule, Take 1-2 capsules (100-200 mg total) by mouth 3 (three) times daily as needed., Disp: 40 capsule, Rfl: 0 .  buPROPion (WELLBUTRIN XL) 150 MG 24 hr tablet, Take 1 tablet (150 mg total) by mouth daily., Disp: 90 tablet, Rfl: 1 .  Cholecalciferol (VITAMIN D PO), Take 1 capsule by mouth daily., Disp: , Rfl:  .  Cyanocobalamin (VITAMIN B-12) 1000  MCG SUBL, Place 1 tablet (1,000 mcg total) under the tongue daily., Disp: 30 tablet, Rfl: 0 .  diclofenac sodium (VOLTAREN) 1 % GEL, Apply 4 g topically 2 (two) times daily as needed., Disp: 2 Tube, Rfl: 1 .  FLUARIX QUADRIVALENT 0.5 ML injection, ADM 0.5ML IM UTD, Disp: , Rfl: 0 .  gabapentin (NEURONTIN) 300 MG capsule, Take 1 capsule (300 mg total) by mouth 3 (three) times daily., Disp: 270 capsule, Rfl: 1 .  Glycopyrrolate-Formoterol (BEVESPI AEROSPHERE) 9-4.8 MCG/ACT AERO, Inhale 2 puffs into the lungs 2 (two) times daily., Disp: 10.7 g, Rfl: 2 .  levothyroxine (SYNTHROID, LEVOTHROID) 88 MCG tablet, Take 1 tablet (88 mcg total) by mouth daily before breakfast., Disp: 90 tablet, Rfl: 1 .  Magnesium Oxide 400 MG CAPS, Take 1 capsule (400 mg total) by mouth once., Disp: 30 capsule, Rfl: 5 .  meloxicam (MOBIC) 15 MG tablet, , Disp: , Rfl: 1 .  metaxalone (SKELAXIN) 800 MG tablet, Take 800 mg by mouth daily as needed for muscle spasms., Disp: , Rfl:  .  montelukast (SINGULAIR) 10 MG tablet, Take 1 tablet (10 mg total) by mouth daily., Disp: 90 tablet, Rfl: 1 .  omeprazole (PRILOSEC) 40 MG capsule, Take 1 capsule (40 mg total) by mouth daily., Disp: 90 capsule, Rfl: 1 .  predniSONE (DELTASONE) 20 MG tablet, Take 1 tablet (20 mg total) by mouth daily with breakfast., Disp: 10 tablet, Rfl: 0 .  sertraline (ZOLOFT) 100 MG tablet, TAKE 2 TABLETS(200 MG) BY MOUTH DAILY, Disp: 180 tablet, Rfl: 1 .  simvastatin (ZOCOR) 40 MG tablet, Take 1 tablet (40 mg total) by mouth at bedtime., Disp: 90 tablet, Rfl: 1  No Known Allergies   ROS  Constitutional: Negative for fever, positive  weight change.  Respiratory: Positive for cough and shortness of breath.   Cardiovascular: Negative for chest pain or palpitations.  Gastrointestinal: Negative for abdominal pain, no bowel changes.  Musculoskeletal: Positive for gait problem - when is a lot of pain, no joint swelling.  Skin: Negative for rash.  Neurological:  Negative for dizziness or headache.  No other specific complaints in a complete review of systems (except as listed in HPI above).  Objective  Vitals:   08/07/16 1101  BP: 120/68  Pulse: (!) 102  Resp: 16  Temp: 98 F (36.7 C)  SpO2: 94%  Weight: 185 lb (83.9 kg)  Height: 5' (1.524 m)    Body mass index is 36.13 kg/m.  Physical Exam  Constitutional: Patient appears well-developed and well-nourished. Obese No distress.  HEENT: head atraumatic, normocephalic, pupils equal and reactive to light, neck supple, throat within normal limits Cardiovascular: Normal rate, regular rhythm and normal heart sounds.  No murmur heard. No BLE edema. Pulmonary/Chest: Effort normal and breath sounds normal. No respiratory distress.  Abdominal: Soft.  There is no tenderness. Psychiatric: Patient has a normal mood and affect. behavior is normal. Judgment and thought content normal. Muscular Skeletal: scar well healed on left knee, normal extension.   PHQ2/9: Depression screen [facility] 2/9 08/07/2016 02/01/2016 04/30/2015 03/08/2015 01/24/2015  Decreased Interest 0 0 0 0 0  Down, Depressed, Hopeless 0 0 0 0 0  PHQ - 2 Score 0 0 0 0 0     Fall Risk: Fall Risk  08/07/2016 02/01/2016 06/26/2015 04/30/2015 04/09/2015  Falls in the past year? Yes No No No No  Number falls in past yr: 1 - - - -  Injury with Fall? Yes - - - -  Follow up Falls evaluation completed - - - -     Functional Status Survey: Is the patient deaf or have difficulty hearing?: No Does the patient have difficulty seeing, even when wearing glasses/contacts?: No Does the patient have difficulty concentrating, remembering, or making decisions?: No Does the patient have difficulty walking or climbing stairs?: Yes (left knee) Does the patient have difficulty dressing or bathing?: No Does the patient have difficulty doing errands alone such as visiting a doctor's office or shopping?: No    Assessment & Plan  1. COPD with acute exacerbation  (HCC)  Because of history of hyperglycemia advised to be careful with diet while taking Prednisone.  - predniSONE (DELTASONE) 20 MG tablet; Take 1 tablet (20 mg total) by mouth daily with breakfast.  Dispense: 10 tablet; Refill: 0 - benzonatate (TESSALON) 100 MG capsule; Take 1-2 capsules (100-200 mg total) by mouth 3 (three) times daily as needed.  Dispense: 40 capsule; Refill: 0  2. Hyperglycemia  Recheck labs next visit  3. Moderate episode of recurrent major depressive disorder (HCC)  - buPROPion (WELLBUTRIN XL) 150 MG 24 hr tablet; Take 1 tablet (150 mg total) by mouth daily.  Dispense: 90 tablet; Refill: 1 - sertraline (ZOLOFT) 100 MG tablet; TAKE 2 TABLETS(200 MG) BY MOUTH DAILY  Dispense: 180 tablet; Refill: 1  4. B12 deficiency  Advised otc supplementation   5. Other hyperlipidemia  Recheck next visit   6. Other specified hypothyroidism  - TSH  7. Chronic obstructive pulmonary disease, unspecified COPD type (HCC)  - Glycopyrrolate-Formoterol (BEVESPI AEROSPHERE) 9-4.8 MCG/ACT AERO; Inhale 2 puffs into the lungs 2 (two) times daily.  Dispense: 10.7 g; Refill: 2  8. Other allergic rhinitis  - montelukast (SINGULAIR) 10 MG tablet; Take 1 tablet (10 mg total) by mouth daily.  Dispense: 90 tablet; Refill: 1

## 2016-08-07 NOTE — Telephone Encounter (Signed)
Amy with [employer] called asked for the work status and office note be faxed to her. The fax number is [phone]  The phone # is [phone]   Claim# is [id number]

## 2016-08-08 NOTE — Telephone Encounter (Signed)
faxed

## 2016-08-14 ENCOUNTER — Other Ambulatory Visit: Payer: Self-pay

## 2016-08-19 ENCOUNTER — Telehealth (INDEPENDENT_AMBULATORY_CARE_PROVIDER_SITE_OTHER): Payer: Self-pay | Admitting: Orthopaedic Surgery

## 2016-08-19 NOTE — Telephone Encounter (Signed)
I called. She has atty. Also PI, she can appeal decision and proceed under her PI and get scan. ROV after scan. She will talk with her atty and decide what she wants to do. FYI

## 2016-08-19 NOTE — Telephone Encounter (Signed)
noted 

## 2016-08-19 NOTE — Telephone Encounter (Signed)
Pt requested a call back regarding MRI she had  [phone]

## 2016-08-19 NOTE — Telephone Encounter (Signed)
I called patient. She states that she has been told that [facility] is not going to cover the MRI for her low back. She would like to know if there is something [facility] that [facility] for [facility] to [facility]? She states that she was on bed rest for a while due to the fracture of her leg. She does believe this back issue is from her fall. Please advise.

## 2016-08-22 ENCOUNTER — Inpatient Hospital Stay: Admission: RE | Admit: 2016-08-22 | Payer: Self-pay | Source: Ambulatory Visit

## 2016-09-12 ENCOUNTER — Telehealth (INDEPENDENT_AMBULATORY_CARE_PROVIDER_SITE_OTHER): Payer: Self-pay | Admitting: Orthopaedic Surgery

## 2016-09-16 ENCOUNTER — Ambulatory Visit (INDEPENDENT_AMBULATORY_CARE_PROVIDER_SITE_OTHER): Payer: Worker's Compensation | Admitting: Orthopaedic Surgery

## 2016-09-16 ENCOUNTER — Encounter (INDEPENDENT_AMBULATORY_CARE_PROVIDER_SITE_OTHER): Payer: Self-pay | Admitting: Orthopaedic Surgery

## 2016-09-16 VITALS — BP 130/78 | HR 84 | Ht 60.0 in | Wt 179.0 lb

## 2016-09-16 DIAGNOSIS — S82122D Displaced fracture of lateral condyle of left tibia, subsequent encounter for closed fracture with routine healing: Secondary | ICD-10-CM

## 2016-09-16 DIAGNOSIS — G8929 Other chronic pain: Secondary | ICD-10-CM

## 2016-09-16 DIAGNOSIS — M5442 Lumbago with sciatica, left side: Secondary | ICD-10-CM | POA: Diagnosis not present

## 2016-09-16 DIAGNOSIS — M5441 Lumbago with sciatica, right side: Secondary | ICD-10-CM | POA: Diagnosis not present

## 2016-09-16 NOTE — Progress Notes (Signed)
Office Visit Note   Patient: Sara Andrews           Date of Birth: Jul 06, 1956           MRN: [id number] Visit Date: 09/16/2016              Requested by: Alba Cory, MD [address] [place], [place] [address] PCP: Ruel Favors, MD   Assessment & Plan: Visit Diagnoses:  1. Closed fracture of lateral portion of left tibial plateau with routine healing, subsequent encounter   2. Chronic bilateral low back pain with bilateral sciatica     Plan: Patient continues to have problems with back pain and knee pain. She doesn't have any effusion and x-rays show satisfactory healing of the fracture. She has some prominence of the plate and I discussed with her possible plate removal at some point. Joint space the for medial and lateral tibiofemoral compartment is symmetrical. I recommend proceeding with with an FCE and she remains out of work pending review of the [employer]. As I previously stated with patient's the history of injury and plan it radiographs showing some listhesis still think this patient needs a lumbar MRI scan.  Follow-Up Instructions: No Follow-up on file.   Orders:  No orders of the defined types were placed in this encounter.  No orders of the defined types were placed in this encounter.     Procedures: No procedures performed   Clinical Data: No additional findings.   Subjective: Chief Complaint  Patient presents with  . Left Knee - Follow-up    Patient returns for follow up. She is status post ORIF Left Bicondylar Tibial Plateau Fracture on 03/10/2016. She is around six months out. She says she still has pain.  She cannot stand or be up on it long at all.  She is still having LBP, radiating into bilateral thighs.  The pain in her back/thighs is worse since she has been full weight bearing on left leg. Work Comp had previously denied her Lumbar MRI scan.  She is taking aleve or ibuprofen as needed for her pain.   Patient states she's having  persistent problems with her back since her on-the-job injury with a tibial plateau fracture. She states she no longer has regular health insurance. Worker's Comp. was denied her lumbar MRI scan. Has full extension of her knee. Lites are stable. She has some prominence of the lateral tibial plateau plate with some tenderness and air the plate incision is nicely healed no erythema.  Review of Systems  Constitutional: Negative for chills and diaphoresis.  HENT: Negative for ear discharge, ear pain and nosebleeds.   Eyes: Negative for discharge and visual disturbance.  Respiratory: Negative for cough, choking and shortness of breath.   Cardiovascular: Negative for chest pain and palpitations.  Gastrointestinal: Negative for abdominal distention and abdominal pain.  Endocrine: Negative for cold intolerance and heat intolerance.  Genitourinary: Negative for flank pain and hematuria.  Musculoskeletal:       Patient continues to complain of constant back pain with activity she has problems with activities of daily living continue problems with her left knee. Pain with ambulation and inability ago out in the community shopping etc.  Skin: Negative for rash and wound.  Neurological: Negative for seizures and speech difficulty.  Hematological: Negative for adenopathy. Does not bruise/bleed easily.  Psychiatric/Behavioral: Negative for agitation and suicidal ideas.     Objective: Vital Signs: BP 130/78   Pulse 84   Ht  5' (1.524 m)   Wt 179 lb (81.2 kg)   BMI 34.96 kg/m   Physical Exam  Constitutional: She is oriented to person, place, and time. She appears well-developed.  HENT:  Head: Normocephalic.  Right Ear: External ear normal.  Left Ear: External ear normal.  Eyes: Pupils are equal, round, and reactive to light.  Neck: No tracheal deviation present. No thyromegaly present.  Cardiovascular: Normal rate.   Pulmonary/Chest: Effort normal.  Abdominal: Soft.  Musculoskeletal:  Left knee  well-healed lateral tibial plateau fracture incision with a long plate. Proximal portion of plate is prominent. She does not have significant crepitus with knee flexion extension she flexes 120 comes old without full extension. Collateral ligaments are stable. Anterior cruciate ligament PCL exam is intact. She is a mature today and new shoes. Heel toe gait normal stride. She has some back discomfort with straight leg raising on the left at 90 . She complains of pain withdraws to palpation lumbosacral junction sciatic notch right left and also the trochanters.  Neurological: She is alert and oriented to person, place, and time.  Skin: Skin is warm and dry.  Psychiatric: She has a normal mood and affect. Her behavior is normal.    Ortho Exam  Specialty Comments:  No specialty comments available.  Imaging: No results found.   PMFS History: Patient Active Problem List   Diagnosis Date Noted  . Chronic bilateral low back pain with bilateral sciatica 09/16/2016  . Hyperglycemia 08/07/2016  . Moderate episode of recurrent major depressive disorder (HCC) 08/07/2016  . Pain of left heel 06/02/2016  . Closed fracture of lateral portion of left tibial plateau 03/10/2016  . Degenerative cervical disc 03/04/2016  . Gastroesophageal reflux disease without esophagitis 02/01/2016  . Primary osteoarthritis of right hand 02/01/2016  . Paresthesias in left hand 02/01/2016  . SOB (shortness of breath) 10/05/2013  . COPD (chronic obstructive pulmonary disease) (HCC) 10/05/2013  . History of smoking 10/05/2013  . Hyperlipidemia 10/05/2013  . Hypothyroidism 10/05/2013   Past Medical History:  Diagnosis Date  . Anxiety   . Arthritis    "hips, hands, back" (03/10/2016)  . Asthma   . Chronic bronchitis (HCC)   . COPD (chronic obstructive pulmonary disease) (HCC)   . Cough   . Depression   . Esophagitis, reflux   . GERD (gastroesophageal reflux disease)   . Hyperlipidemia   . Hypothyroidism   .  IBS (irritable bowel syndrome)   . Lumbago   . Muscle pain   . Osteoarthritis   . Sinus disorder   . Thyroid disease   . Type 2 diabetes, diet controlled (HCC)   . Uncomplicated herpes simplex     Family History  Problem Relation Age of Onset  . Hypertension Daughter   . Diabetes Father   . Heart disease Father   . Breast cancer Maternal Aunt 60  . Breast cancer Paternal Aunt 58  . Depression Sister     Past Surgical History:  Procedure Laterality Date  . ABDOMINAL HYSTERECTOMY    . CARPAL TUNNEL RELEASE Right   . ORIF TIBIA PLATEAU Left 03/10/2016   Procedure: OPEN REDUCTION INTERNAL FIXATION (ORIF) BICONDYLAR TIBIAL PLATEAU FRACTURE;  Surgeon: Eldred Manges, MD;  Location: [facility] OR;  Service: Orthopedics;  Laterality: Left;  . SHOULDER ARTHROSCOPY W/ ROTATOR CUFF REPAIR Right   . TONSILLECTOMY AND ADENOIDECTOMY     Social History   Occupational History  . Not on file.   Social History Main Topics  .  Smoking status: Former Smoker    Packs/day: 2.00    Years: 30.00    Quit date: 09/03/2008  . Smokeless tobacco: Never Used  . Alcohol use No  . Drug use: No  . Sexual activity: No

## 2016-09-17 NOTE — Telephone Encounter (Signed)
Call routed to phones.

## 2016-09-18 ENCOUNTER — Telehealth (INDEPENDENT_AMBULATORY_CARE_PROVIDER_SITE_OTHER): Payer: Self-pay

## 2016-09-18 NOTE — Telephone Encounter (Signed)
Received voicemail stating FCE is approved and she will set up

## 2016-09-18 NOTE — Telephone Encounter (Signed)
Received vm requesting work status from last office visit. Faxed to [phone]. WGNFA#[id number]#[id number].

## 2016-09-19 ENCOUNTER — Ambulatory Visit (INDEPENDENT_AMBULATORY_CARE_PROVIDER_SITE_OTHER): Payer: Self-pay | Admitting: Orthopaedic Surgery

## 2016-09-24 ENCOUNTER — Ambulatory Visit
Admission: RE | Admit: 2016-09-24 | Discharge: 2016-09-24 | Disposition: A | Discharge status: Home / Self Care | Payer: No Typology Code available for payment source | Source: Ambulatory Visit | Attending: Orthopaedic Surgery | Admitting: Orthopaedic Surgery

## 2016-09-24 DIAGNOSIS — M48061 Spinal stenosis, lumbar region without neurogenic claudication: Secondary | ICD-10-CM | POA: Insufficient documentation

## 2016-09-24 DIAGNOSIS — M4316 Spondylolisthesis, lumbar region: Secondary | ICD-10-CM | POA: Insufficient documentation

## 2016-09-24 DIAGNOSIS — M545 Low back pain, unspecified: Secondary | ICD-10-CM

## 2016-09-24 DIAGNOSIS — M47896 Other spondylosis, lumbar region: Secondary | ICD-10-CM | POA: Diagnosis not present

## 2016-09-24 DIAGNOSIS — M1288 Other specific arthropathies, not elsewhere classified, other specified site: Secondary | ICD-10-CM | POA: Insufficient documentation

## 2016-10-01 ENCOUNTER — Telehealth (INDEPENDENT_AMBULATORY_CARE_PROVIDER_SITE_OTHER): Payer: Self-pay | Admitting: Orthopaedic Surgery

## 2016-10-01 NOTE — Telephone Encounter (Signed)
Jessica from [facility] called asking for the MRI results to be faxed over to her office. Fax # [phone]

## 2016-10-07 NOTE — Telephone Encounter (Signed)
faxed

## 2016-10-08 ENCOUNTER — Telehealth (INDEPENDENT_AMBULATORY_CARE_PROVIDER_SITE_OTHER): Payer: Self-pay | Admitting: Orthopaedic Surgery

## 2016-10-08 NOTE — Telephone Encounter (Signed)
Sara Andrews from [facility] called asking for the MRI report to be sent over. CB # [phone] Fax # [phone]

## 2016-10-08 NOTE — Telephone Encounter (Signed)
faxed

## 2016-10-28 ENCOUNTER — Encounter (INDEPENDENT_AMBULATORY_CARE_PROVIDER_SITE_OTHER): Payer: Self-pay | Admitting: Orthopaedic Surgery

## 2016-10-28 ENCOUNTER — Ambulatory Visit (INDEPENDENT_AMBULATORY_CARE_PROVIDER_SITE_OTHER): Payer: Worker's Compensation | Admitting: Orthopaedic Surgery

## 2016-10-28 VITALS — BP 132/84 | HR 85 | Ht <= 58 in | Wt 190.0 lb

## 2016-10-28 DIAGNOSIS — M48062 Spinal stenosis, lumbar region with neurogenic claudication: Secondary | ICD-10-CM

## 2016-10-28 DIAGNOSIS — S82122D Displaced fracture of lateral condyle of left tibia, subsequent encounter for closed fracture with routine healing: Secondary | ICD-10-CM | POA: Diagnosis not present

## 2016-10-28 NOTE — Progress Notes (Signed)
Office Visit Note   Patient: Sara Andrews           Date of Birth: 1955/07/31           MRN: [id number] Visit Date: 10/28/2016              Requested by: Alba Cory, MD [address] [place], [place] [address] PCP: Ruel Favors, MD   Assessment & Plan: Visit Diagnoses: Status post left tibial plateau ORIF for injury that occurred on March 10, 2016 while on the job. Plan: patient returns after FCE. I reviewed the tests with her and she actually has a copy of it at home. Patient could resume work activity in line with findings on the [employer]. Based on the injury the patient occurred on the job I would rate her impairment at 15% of the left leg. Patient is at [facility]. Patient can return concerning her knee on a when necessary basis. I discussed with her with a continued from she's having with her knee is possible that she may require total knee arthroplasty and with the plate present I discussed with her that if she needs to have total knee arthroplasty I would recommend removing the plate and then 2 months later or 3 months later considering proceeding with total knee arthroplasty due to the skin bridge between the 2 incisions.  Patient asked about the back from she's been having we reviewed the MRI scan with patient and her daughter is with her today. Patient has single level moderately severe stenosis with bilateral facet degenerative changes associated synovial cysts inferiorly and posterior to the facet joints with significant facet instability and degenerative spondylolisthesis. This is a single level problem for her other levels show minimal wear. She asked about solutions for the degenerative process she has in her back I discussed with her she would require single level decompression and fusion for the instability problem with spinal stenosis and claudication symptoms that she's having.  Patient is here today with her daughter. They expressed desire to proceed with  stabilization procedure for lumbar instability which is a degenerative problem and not related to acute tibial plateau fracture. She understands that postoperatively would take 2-3 months to get over this surgery. She should be able stand longer I expect she still would have problems with stairs with her knee. If she can stand longer this could increase her work capacity. Follow-Up Instructions: Return in about 1 week (around 11/04/2016).   Orders:  No orders of the defined types were placed in this encounter.  No orders of the defined types were placed in this encounter.     Procedures: No procedures performed   Clinical Data: No additional findings.   Subjective: Chief Complaint  Patient presents with  . Lower Back - Pain  . Left Knee - Pain    Patient returns for follow up. She is here to review MRI Lumbar and has had her FCE for the Workman's Comp injury of her left tibial plateau. She states that she has continued back and left leg pain. She had to go up stairs for the FCE and her leg has been bothering her a lot more since then. PT has given her some exercises to do to help with the knee and burning that she has down in her lower anterior shin. She states that the knee throbs at night.   Patient has pain with standing and is limited in ability to ambulate distances which was documented in her FCE.  She does better she leans over grocery cart. She gets relief with sitting or supine position. Her back and bilateral leg pain ease off when she sits when she gets up and stands again her ambulates several 100 feet she has recurrence of her symptoms. Her symptoms began last fall after tibial plateau fracture and we reviewed the MRI scan with her today of her lumbar spine once again and discussed the pathology present.  Review of Systems unchanged from the previous visit last month. She still has ongoing back pain pain when she stands she is limited in walking which is well documented on the  FCE she has pain with bending twisting she does better when she leans forward. Ongoing back pain consistent with the MRI findings of spinal stenosis at the L4-L5 level with intact pars with facet arthropathy and grade 1 spondylolisthesis.   Objective: Vital Signs: BP 132/84   Pulse 85   Ht  (1.448 m)   Wt 190 lb (86.2 kg)   BMI 41.12 kg/m   Physical Exam  Constitutional: She is oriented to person, place, and time. She appears well-developed.  HENT:  Head: Normocephalic.  Right Ear: External ear normal.  Left Ear: External ear normal.  Eyes: Pupils are equal, round, and reactive to light.  Neck: No tracheal deviation present. No thyromegaly present.  Cardiovascular: Normal rate.   Pulmonary/Chest: Effort normal.  Abdominal: Soft.  Musculoskeletal:  Patient has a full extension of her knee well healed lateral tibial plateau incision. Some crepitus with knee range of motion she flexes 215. Collateral ligaments are stable. Opposite knee has good flexion-extension. She has sciatic notch tenderness. Reflexes are 2+ and symmetrical. With standing she has increased back pain leg pain.  Neurological: She is alert and oriented to person, place, and time.  Skin: Skin is warm and dry.  Psychiatric: She has a normal mood and affect. Her behavior is normal.    Ortho Exam negative straight leg raising. She has bilateral sciatic notch tenderness. Negative popliteal compression test. Hip range of motion is normal SI joint testing is normal.  Specialty Comments:  13 page FCE result is reviewed from [facility] in [place]. Increased symptoms after 6 minutes walk test. She problems with waist to floor lifting waist to crown lifting. Prilosec bending forward crouching and kneeling. Patient had a valid test without evidence of symptom magnification. She could lift 15 pounds to waist to floor, 14 pounds waist to crown with handles, 13 pounds waist to crown and 23 pounds front carry.  Patient had problems with stairs due to her knee, problem with ambulation due to her back problems with standing work due to her back. She fell in a sedentary physical demand level due to her spinal stenosis symptoms and knee problems after ORIF tibial plateau.  Imaging: Study Result   CLINICAL DATA:  Low back pain for 1.5 months which is worse with standing and walking. Bilateral buttock and upper leg pain. History of fall from a ladder in August, 2017.  EXAM: MRI LUMBAR SPINE WITHOUT CONTRAST  TECHNIQUE: Multiplanar, multisequence MR imaging of the lumbar spine was performed. No intravenous contrast was administered.  COMPARISON:  Plain films lumbar spine performed at [facility] 08/05/2016.  FINDINGS: Segmentation:  Standard.  Alignment: 0.3 cm facet mediated anterolisthesis L4 on L5 is seen. Alignment is otherwise normal. No pars defect is identified.  Vertebrae:  Height and signal are unremarkable.  Conus medullaris: Extends to the L1-2 level and appears normal.  Paraspinal  and other soft tissues: Negative.  Disc levels:  T11-12: Shallow broad-based central protrusion and ligamentum flavum thickening on the left. The central canal and foramina are open. Small perineural cyst on the left is noted.  T12-L1: Negative. Perineural cysts are incidentally noted, larger on the right.  L1-2: There is some ligamentum flavum thickening. The central canal and foramina are open.  L2-3:  Mild facet degenerative change.  Otherwise negative.  L3-4: Minimal disc bulge without central canal or foraminal narrowing.  L4-5: There is bilateral facet degenerative change with associated effusions. Synovial cysts are seen off the posterior, inferior margins of both facet joints, larger on the left, where a cyst measuring 0.8 cm is identified. These cysts do not impact the central canal and nerve roots. Ligamentum flavum thickening is present. The disc is  uncovered with a shallow bulge. Moderate to moderately severe central canal stenosis is seen with narrowing in the subarticular recesses. Mild to moderate foraminal narrowing appears worse on the right.  L5-S1: Very shallow central protrusion without central canal or foraminal stenosis.  IMPRESSION: Spondylosis worst at L4-5 where there is a moderate to moderately severe central canal narrowing due to ligamentum flavum thickening and a shallow disc bulge. Facet arthropathy at this level with associated effusions results in 0.3 cm anterolisthesis.   Electronically Signed   By: Drusilla Kanner M.D.   On: 09/24/2016 08:50      PMFS History: Patient Active Problem List   Diagnosis Date Noted  . Spinal stenosis of lumbar region with neurogenic claudication 10/28/2016  . Chronic bilateral low back pain with bilateral sciatica 09/16/2016  . Hyperglycemia 08/07/2016  . Moderate episode of recurrent major depressive disorder (HCC) 08/07/2016  . Pain of left heel 06/02/2016  . Closed fracture of lateral portion of left tibial plateau 03/10/2016  . Degenerative cervical disc 03/04/2016  . Gastroesophageal reflux disease without esophagitis 02/01/2016  . Primary osteoarthritis of right hand 02/01/2016  . Paresthesias in left hand 02/01/2016  . SOB (shortness of breath) 10/05/2013  . COPD (chronic obstructive pulmonary disease) (HCC) 10/05/2013  . History of smoking 10/05/2013  . Hyperlipidemia 10/05/2013  . Hypothyroidism 10/05/2013   Past Medical History:  Diagnosis Date  . Anxiety   . Arthritis    "hips, hands, back" (03/10/2016)  . Asthma   . Chronic bronchitis (HCC)   . COPD (chronic obstructive pulmonary disease) (HCC)   . Cough   . Depression   . Esophagitis, reflux   . GERD (gastroesophageal reflux disease)   . Hyperlipidemia   . Hypothyroidism   . IBS (irritable bowel syndrome)   . Lumbago   . Muscle pain   . Osteoarthritis   . Sinus disorder   . Thyroid  disease   . Type 2 diabetes, diet controlled (HCC)   . Uncomplicated herpes simplex     Family History  Problem Relation Age of Onset  . Hypertension Daughter   . Diabetes Father   . Heart disease Father   . Breast cancer Maternal Aunt 60  . Breast cancer Paternal Aunt 1  . Depression Sister     Past Surgical History:  Procedure Laterality Date  . ABDOMINAL HYSTERECTOMY    . CARPAL TUNNEL RELEASE Right   . ORIF TIBIA PLATEAU Left 03/10/2016   Procedure: OPEN REDUCTION INTERNAL FIXATION (ORIF) BICONDYLAR TIBIAL PLATEAU FRACTURE;  Surgeon: Eldred Manges, MD;  Location: [facility] OR;  Service: Orthopedics;  Laterality: Left;  . SHOULDER ARTHROSCOPY W/ ROTATOR CUFF REPAIR Right   .  TONSILLECTOMY AND ADENOIDECTOMY     Social History   Occupational History  . Not on file.   Social History Main Topics  . Smoking status: Former Smoker    Packs/day: 2.00    Years: 30.00    Quit date: 09/03/2008  . Smokeless tobacco: Never Used  . Alcohol use No  . Drug use: No  . Sexual activity: No

## 2016-10-31 ENCOUNTER — Telehealth (INDEPENDENT_AMBULATORY_CARE_PROVIDER_SITE_OTHER): Payer: Self-pay

## 2016-10-31 NOTE — Telephone Encounter (Signed)
Can you advise me on patient's work status at this point?  I know that she is working to get scheduled for lumbar spine surgery. Is patient continued out of work pending that?

## 2016-10-31 NOTE — Telephone Encounter (Signed)
Emina from  [employer] is needing the last Office note and work status note faxed to her to fax number below.   FAX: [phone] Claim Number: [id number]

## 2016-10-31 NOTE — Telephone Encounter (Signed)
Fax the note from last visit. It is all in the note. thanks

## 2016-10-31 NOTE — Telephone Encounter (Signed)
Faxed the 10/28/16 office and work note to Amina per her request [phone]

## 2016-11-04 NOTE — Telephone Encounter (Signed)
faxed

## 2016-11-05 ENCOUNTER — Ambulatory Visit: Payer: PRIVATE HEALTH INSURANCE | Admitting: Family Medicine

## 2016-11-06 ENCOUNTER — Telehealth (INDEPENDENT_AMBULATORY_CARE_PROVIDER_SITE_OTHER): Payer: Self-pay | Admitting: Orthopaedic Surgery

## 2016-11-06 ENCOUNTER — Other Ambulatory Visit: Payer: Self-pay | Admitting: Family Medicine

## 2016-11-06 ENCOUNTER — Encounter: Payer: Self-pay | Admitting: Family Medicine

## 2016-11-06 ENCOUNTER — Ambulatory Visit (INDEPENDENT_AMBULATORY_CARE_PROVIDER_SITE_OTHER): Payer: PRIVATE HEALTH INSURANCE | Admitting: Family Medicine

## 2016-11-06 ENCOUNTER — Telehealth (INDEPENDENT_AMBULATORY_CARE_PROVIDER_SITE_OTHER): Payer: Self-pay

## 2016-11-06 VITALS — BP 130/80 | HR 82 | Temp 97.4°F | Resp 16 | Ht <= 58 in | Wt 194.4 lb

## 2016-11-06 DIAGNOSIS — Z79899 Other long term (current) drug therapy: Secondary | ICD-10-CM | POA: Diagnosis not present

## 2016-11-06 DIAGNOSIS — E784 Other hyperlipidemia: Secondary | ICD-10-CM

## 2016-11-06 DIAGNOSIS — M5030 Other cervical disc degeneration, unspecified cervical region: Secondary | ICD-10-CM | POA: Diagnosis not present

## 2016-11-06 DIAGNOSIS — F331 Major depressive disorder, recurrent, moderate: Secondary | ICD-10-CM

## 2016-11-06 DIAGNOSIS — J449 Chronic obstructive pulmonary disease, unspecified: Secondary | ICD-10-CM

## 2016-11-06 DIAGNOSIS — E538 Deficiency of other specified B group vitamins: Secondary | ICD-10-CM

## 2016-11-06 DIAGNOSIS — R202 Paresthesia of skin: Secondary | ICD-10-CM

## 2016-11-06 DIAGNOSIS — E782 Mixed hyperlipidemia: Secondary | ICD-10-CM

## 2016-11-06 DIAGNOSIS — E119 Type 2 diabetes mellitus without complications: Secondary | ICD-10-CM

## 2016-11-06 DIAGNOSIS — E038 Other specified hypothyroidism: Secondary | ICD-10-CM

## 2016-11-06 DIAGNOSIS — K219 Gastro-esophageal reflux disease without esophagitis: Secondary | ICD-10-CM

## 2016-11-06 DIAGNOSIS — M47816 Spondylosis without myelopathy or radiculopathy, lumbar region: Secondary | ICD-10-CM | POA: Insufficient documentation

## 2016-11-06 DIAGNOSIS — E7849 Other hyperlipidemia: Secondary | ICD-10-CM

## 2016-11-06 LAB — POCT UA - MICROALBUMIN: MICROALBUMIN (UR) POC: NEGATIVE mg/L

## 2016-11-06 MED ORDER — SIMVASTATIN 40 MG PO TABS: 40.0000 mg | ORAL_TABLET | Freq: Every day | ORAL | 1 refills | Status: DC

## 2016-11-06 MED ORDER — GABAPENTIN 300 MG PO CAPS: 300.0000 mg | ORAL_CAPSULE | Freq: Three times a day (TID) | ORAL | 1 refills | Status: DC

## 2016-11-06 MED ORDER — DULOXETINE HCL 30 MG PO CPEP: 30.0000 mg | ORAL_CAPSULE | Freq: Every day | ORAL | 0 refills | Status: DC

## 2016-11-06 MED ORDER — GLYCOPYRROLATE-FORMOTEROL 9-4.8 MCG/ACT IN AERO: 2.0000 | INHALATION_SPRAY | Freq: Two times a day (BID) | RESPIRATORY_TRACT | 5 refills | Status: DC

## 2016-11-06 MED ORDER — PREDNISONE 20 MG PO TABS: 20.0000 mg | ORAL_TABLET | Freq: Every day | ORAL | 0 refills | Status: DC

## 2016-11-06 MED ORDER — RANITIDINE HCL 150 MG PO TABS: 150.0000 mg | ORAL_TABLET | Freq: Two times a day (BID) | ORAL | 1 refills | Status: DC

## 2016-11-06 NOTE — Telephone Encounter (Signed)
I called and left her message. She did not pick up

## 2016-11-06 NOTE — Telephone Encounter (Signed)
Received email requesting specific work restrictions for this patient including lifting, pulling, pushing limitations.

## 2016-11-06 NOTE — Telephone Encounter (Signed)
[employer] [facility]) calling:  Needs clarification for permanent restrictions. fax [phone] ref claim #  [id number] cb  [id number].

## 2016-11-06 NOTE — Patient Instructions (Signed)
Take 150 mg of Zoloft with Cymbalta 30 mg daily for one week,  Take 100 mg of Zoloft with Cymbalta 60 mg ( two capsules ) for the second week Take 50 mg of Zoloft with Cymbalta 60 mg for the third week After that try stopping Zoloft and continue Cymbalta

## 2016-11-06 NOTE — Telephone Encounter (Signed)
Patient requesting refill of Duloxetine with a 90 day supply to [facility].

## 2016-11-06 NOTE — Telephone Encounter (Signed)
Duplicate. They have contacted Amy Bishop as well. Note sent to Dr. Ophelia Charter.

## 2016-11-06 NOTE — Progress Notes (Addendum)
Name: Sara Andrews   MRN: [id number]    DOB: January 09, 1956   Date:11/06/2016       Progress Note  Subjective  Chief Complaint  Chief Complaint  Patient presents with  . Depression  . COPD  . Hyperlipidemia    HPI  Major Depression: long history of depression and taking Zoloft since her husband died in Dec 06, 2006. She is taking 200 mg daily, she lives by herself. She states that she is feeling tired, no energy, difficulty sleeping at night, no suicidal thoughts or ideation. She states crying spells resolved since  we added Wellbutrin XL 150 mg Fall 2017 . She is under more stress since workman's comp injury - fracture left tibial plateau, also now in a lot of pain secondary to spinal stenosis.   DDD and spinal stenosis lumbar spine: on gabapentin, seen by ortho and advised surgery but she would like second opinion from another workman's comp doctor. Pain is affecting her ability to sleep, pain is constant on buttocks and posterior thighs, it is described as burning and at times causes her inability to move. Average pain is 10/10  DDD cervical and paresthesia left arm: she has gone to urgent care since 2022-12-06 twice for numbness left hand, pain on left arm and had X-ray of her neck that showed DDD cervical spine, she has an appointment scheduled with Dr. Malvin Johns that is a neurologist, she tried stopping medication but went to Urgent care about 5 weeks ago because of worsening of numbness, she was advised to re-start gabapentin but she states numbness and pain on right shoulder is not back to baseline.   B12 deficiency: not taking supplementation, feels tired, she ran out of supplements  COPD: she is on Bevespi since 06-Dec-2015. She quit smoking in 2007-12-06. She has chronic SOB, however she states that she is feeling worse over the past couple of weeks. No fever or chills, but has noticed worsening of wheezing, increase in cough, and occasionally productive.   AR: taking singulair every night, she states  symptoms a little worse this past week, she has nasal congestion and facial pressure. She is using nasal spray that was given by the PA at her job. Continue current medications  GERD: taking Omeprazole daily, she states symptoms are intermittent now and is not taking discussed risk of long term use of PPI and she is willing to try weaning self off   Fever blisters: she has outbreaks usually during the Summer taking Acyclovir.  Hypothyroidism: reviewed labs and TSH was towards higher end of normal, we will recheck labs today, she has chronic dry skin, but denies change in bowel movements. She has been feeling more tired than usual.  DM II : diagnosed at work back in 12-06-2010 she used to take medications, last hgbA1C was 6.2%, she denies polyphagia, polyuria or polydipsia.   Patient Active Problem List   Diagnosis Date Noted  . Spondylosis of lumbar spine 11/06/2016  . Spinal stenosis of lumbar region with neurogenic claudication 10/28/2016  . Chronic bilateral low back pain with bilateral sciatica 09/16/2016  . Hyperglycemia 08/07/2016  . Moderate episode of recurrent major depressive disorder (HCC) 08/07/2016  . Pain of left heel 06/02/2016  . Closed fracture of lateral portion of left tibial plateau 03/10/2016  . Degenerative cervical disc 03/04/2016  . Gastroesophageal reflux disease without esophagitis 02/01/2016  . Primary osteoarthritis of right hand 02/01/2016  . Paresthesias in left hand 02/01/2016  . SOB (shortness of breath) 10/05/2013  . COPD (chronic  obstructive pulmonary disease) (HCC) 10/05/2013  . History of smoking 10/05/2013  . Hyperlipidemia 10/05/2013  . Hypothyroidism 10/05/2013    Past Surgical History:  Procedure Laterality Date  . ABDOMINAL HYSTERECTOMY    . CARPAL TUNNEL RELEASE Right   . ORIF TIBIA PLATEAU Left 03/10/2016   Procedure: OPEN REDUCTION INTERNAL FIXATION (ORIF) BICONDYLAR TIBIAL PLATEAU FRACTURE;  Surgeon: Eldred Manges, MD;  Location: [facility] OR;   Service: Orthopedics;  Laterality: Left;  . SHOULDER ARTHROSCOPY W/ ROTATOR CUFF REPAIR Right   . TONSILLECTOMY AND ADENOIDECTOMY      Family History  Problem Relation Age of Onset  . Hypertension Daughter   . Diabetes Father   . Heart disease Father   . Breast cancer Maternal Aunt 60  . Breast cancer Paternal Aunt 102  . Depression Sister     Social History   Social History  . Marital status: Widowed    Spouse name: N/A  . Number of children: N/A  . Years of education: N/A   Occupational History  . Not on file.   Social History Main Topics  . Smoking status: Former Smoker    Packs/day: 2.00    Years: 30.00    Quit date: 09/03/2008  . Smokeless tobacco: Never Used  . Alcohol use No  . Drug use: No  . Sexual activity: No   Other Topics Concern  . Not on file   Social History Narrative   She works at [employer], but had a left knee work related injury 03/10/2016 , require left knee surgery and is now having back pain, that is getting evaluated, Out of work since.      Current Outpatient Prescriptions:  .  acyclovir (ZOVIRAX) 400 MG tablet, TAKE 1 TABLET(400 MG) BY MOUTH DAILY, Disp: 30 tablet, Rfl: 0 .  albuterol (PROVENTIL) (2.5 MG/3ML) 0.083% nebulizer solution, Take 3 mLs (2.5 mg total) by nebulization every 6 (six) hours as needed for wheezing or shortness of breath., Disp: 75 mL, Rfl: 12 .  ASPIRIN LOW DOSE 81 MG EC tablet, TK 1 T PO QD, Disp: , Rfl: 0 .  Cholecalciferol (VITAMIN D PO), Take 1 capsule by mouth daily., Disp: , Rfl:  .  diclofenac sodium (VOLTAREN) 1 % GEL, Apply 4 g topically 2 (two) times daily as needed., Disp: 2 Tube, Rfl: 1 .  gabapentin (NEURONTIN) 300 MG capsule, Take 1 capsule (300 mg total) by mouth 3 (three) times daily., Disp: 270 capsule, Rfl: 1 .  Glycopyrrolate-Formoterol (BEVESPI AEROSPHERE) 9-4.8 MCG/ACT AERO, Inhale 2 puffs into the lungs 2 (two) times daily., Disp: 10.7 g, Rfl: 5 .  levothyroxine (SYNTHROID, LEVOTHROID) 88 MCG  tablet, Take 1 tablet (88 mcg total) by mouth daily before breakfast., Disp: 90 tablet, Rfl: 1 .  meloxicam (MOBIC) 15 MG tablet, , Disp: , Rfl: 1 .  montelukast (SINGULAIR) 10 MG tablet, Take 1 tablet (10 mg total) by mouth daily., Disp: 90 tablet, Rfl: 1 .  simvastatin (ZOCOR) 40 MG tablet, Take 1 tablet (40 mg total) by mouth at bedtime., Disp: 90 tablet, Rfl: 1 .  DULoxetine (CYMBALTA) 30 MG capsule, Take 1 capsule (30 mg total) by mouth daily. In place of Zoloft and Wellbutrin, Disp: 60 capsule, Rfl: 0 .  Magnesium Oxide 400 MG CAPS, Take 1 capsule (400 mg total) by mouth once. (Patient not taking: Reported on 11/06/2016), Disp: 30 capsule, Rfl: 5 .  predniSONE (DELTASONE) 20 MG tablet, Take 1 tablet (20 mg total) by mouth daily with breakfast., Disp: 10  tablet, Rfl: 0 .  ranitidine (ZANTAC) 150 MG tablet, Take 1 tablet (150 mg total) by mouth 2 (two) times daily. In place of Omeprazole, Disp: 180 tablet, Rfl: 1  No Known Allergies   ROS  Constitutional: Negative for fever or weight change.  Respiratory: Positive for cough and shortness of breath.   Cardiovascular: Negative for chest pain or palpitations.  Gastrointestinal: Negative for abdominal pain, no bowel changes.  Musculoskeletal: Positive  for gait problem and left  joint swelling.  Skin: Negative for rash.  Neurological: Negative for dizziness or headache.  No other specific complaints in a complete review of systems (except as listed in HPI above).  Objective  Vitals:   11/06/16 1146  BP: 130/80  Pulse: 82  Resp: 16  Temp: 97.4 F (36.3 C)  SpO2: 96%  Weight: 194 lb 6 oz (88.2 kg)  Height:  (1.448 m)    Body mass index is 42.06 kg/m.  Physical Exam  Constitutional: Patient appears well-developed and well-nourished. Obese  No distress.  HEENT: head atraumatic, normocephalic, neck supple, throat within normal limits Cardiovascular: Normal rate, regular rhythm and normal heart sounds.  No murmur heard. No  BLE edema. Pulmonary/Chest: Effort normal and breath sounds normal. No respiratory distress. Abdominal: Soft.  There is no tenderness. Psychiatric: Patient has a normal mood and affect. behavior is normal. Judgment and thought content normal. Muscular Skeletal: normal rom lumbar spine, negative straight leg raise, normal sensation both legs  PHQ2/9: Depression screen [facility] 2/9 08/07/2016 02/01/2016 04/30/2015 03/08/2015 01/24/2015  Decreased Interest 0 0 0 0 0  Down, Depressed, Hopeless 0 0 0 0 0  PHQ - 2 Score 0 0 0 0 0     Fall Risk: Fall Risk  08/07/2016 02/01/2016 06/26/2015 04/30/2015 04/09/2015  Falls in the past year? Yes No No No No  Number falls in past yr: 1 - - - -  Injury with Fall? Yes - - - -  Follow up Falls evaluation completed - - - -      Assessment & Plan   1. Moderate episode of recurrent major depressive disorder (HCC)  - DULoxetine (CYMBALTA) 30 MG capsule; Take 1 capsule (30 mg total) by mouth daily. In place of Zoloft and Wellbutrin  Dispense: 60 capsule; Refill: 0  2. Diabetes Type 2 diet controlled  - Hemoglobin A1c - Insulin, fasting  3. Other specified hypothyroidism  - TSH  4. Other hyperlipidemia  - Lipid panel  5. Chronic obstructive pulmonary disease, unspecified COPD type (HCC)  - Glycopyrrolate-Formoterol (BEVESPI AEROSPHERE) 9-4.8 MCG/ACT AERO; Inhale 2 puffs into the lungs 2 (two) times daily.  Dispense: 10.7 g; Refill: 5 - predniSONE (DELTASONE) 20 MG tablet; Take 1 tablet (20 mg total) by mouth daily with breakfast.  Dispense: 10 tablet; Refill: 0  6. Mixed hyperlipidemia  - simvastatin (ZOCOR) 40 MG tablet; Take 1 tablet (40 mg total) by mouth at bedtime.  Dispense: 90 tablet; Refill: 1  7. Paresthesias in left hand  - gabapentin (NEURONTIN) 300 MG capsule; Take 1 capsule (300 mg total) by mouth 3 (three) times daily.  Dispense: 270 capsule; Refill: 1  8. Degenerative cervical disc  - gabapentin (NEURONTIN) 300 MG capsule; Take 1  capsule (300 mg total) by mouth 3 (three) times daily.  Dispense: 270 capsule; Refill: 1  9. Long-term use of high-risk medication  - COMPLETE METABOLIC PANEL WITH GFR  10. B12 deficiency  - Vitamin B12  11. Gastroesophageal reflux disease without esophagitis  - ranitidine (  ZANTAC) 150 MG tablet; Take 1 tablet (150 mg total) by mouth 2 (two) times daily. In place of Omeprazole  Dispense: 180 tablet; Refill: 1

## 2016-11-06 NOTE — Addendum Note (Signed)
Addended by: Cynda Familia on: 11/06/2016 12:51 PM   Modules accepted: Orders

## 2016-11-06 NOTE — Telephone Encounter (Signed)
Please advise. I sent your last office note as you requested and they are asking for specific information. Thanks.

## 2016-11-12 ENCOUNTER — Telehealth: Payer: Self-pay | Admitting: Family Medicine

## 2016-11-12 NOTE — Telephone Encounter (Signed)
She needs to get a second opinion from another workman's comp doctor. She needs to ask her currently Ortho to send her for a second opinion. I can't do that for her

## 2016-11-12 NOTE — Telephone Encounter (Signed)
Pt states she would like to know if she can get a referral for her back. She states last week when she was in she was told if she wanted a referral to a back specialist. Please advise.

## 2016-11-13 NOTE — Telephone Encounter (Signed)
Patient notified and will call Orthopedics.

## 2016-11-18 ENCOUNTER — Telehealth (INDEPENDENT_AMBULATORY_CARE_PROVIDER_SITE_OTHER): Payer: Self-pay | Admitting: Orthopedic Surgery

## 2016-11-18 NOTE — Telephone Encounter (Signed)
I called Sara Andrews to discuss scheduling her back surgery.  She stated that she was not ready to schedule at this time.  Advised that she was "let go" by her employer because of her work restrictions. She has an attorney who is looking into this for her.  She will call us back when she is ready to schedule.

## 2016-12-04 ENCOUNTER — Ambulatory Visit (INDEPENDENT_AMBULATORY_CARE_PROVIDER_SITE_OTHER): Payer: Self-pay | Admitting: Family Medicine

## 2016-12-04 ENCOUNTER — Encounter: Payer: Self-pay | Admitting: Family Medicine

## 2016-12-04 VITALS — BP 116/72 | HR 111 | Temp 98.1°F | Resp 16 | Ht <= 58 in | Wt 195.2 lb

## 2016-12-04 DIAGNOSIS — F331 Major depressive disorder, recurrent, moderate: Secondary | ICD-10-CM

## 2016-12-04 DIAGNOSIS — Z114 Encounter for screening for human immunodeficiency virus [HIV]: Secondary | ICD-10-CM

## 2016-12-04 DIAGNOSIS — Z1159 Encounter for screening for other viral diseases: Secondary | ICD-10-CM

## 2016-12-04 DIAGNOSIS — J3089 Other allergic rhinitis: Secondary | ICD-10-CM

## 2016-12-04 DIAGNOSIS — D649 Anemia, unspecified: Secondary | ICD-10-CM

## 2016-12-04 MED ORDER — MONTELUKAST SODIUM 10 MG PO TABS: 10.0000 mg | ORAL_TABLET | Freq: Every day | ORAL | 1 refills | Status: DC

## 2016-12-04 MED ORDER — DULOXETINE HCL 30 MG PO CPEP: 90.0000 mg | ORAL_CAPSULE | Freq: Every day | ORAL | 1 refills | Status: DC

## 2016-12-04 NOTE — Progress Notes (Signed)
Name: Sara Andrews   MRN: [id number]    DOB: [birth year]   Date:12/04/2016       Progress Note  Subjective  Chief Complaint  Chief Complaint  Patient presents with  . Follow-up    1 month F/U  . Depression    Started Cymbalta and still fighting her depression symptoms. Since she lost her job she is struggling with it.  Marland Kitchen COPD    Needs refill of Albuterol and coughing    HPI  Major Depression: long history of depression and taking Zoloft since her husband died in 12/06/06. She was taking 200 mg daily but on her last visit we decided to wean off Zoloft and change to Cymbalta to help her with energy and pain. She is still taking 50 mg of Zoloft and is taking 60 mg of Cymbalta, she will off Zoloft this weekly. Since last visit she has been laid off from her job because of inability to do her job description. She still waiting for the settlement. She is still in a lot of pain, she has been crying more and feels sad because of her health and because of her daily pain.  She lives by herself. She denies  suicidal thoughts or ideation.  Workman's comp injury - fracture left tibial plateau, also now in a lot of pain secondary to spinal stenosis. Advised to go up to 90 of Cymbalta once off Zoloft, also discussed counseling and try to go to pain clinic to get pain under control. She will discuss it with workman's comp    Patient Active Problem List   Diagnosis Date Noted  . Spondylosis of lumbar spine 11/06/2016  . Spinal stenosis of lumbar region with neurogenic claudication 10/28/2016  . Chronic bilateral low back pain with bilateral sciatica 09/16/2016  . Hyperglycemia 08/07/2016  . Moderate episode of recurrent major depressive disorder (HCC) 08/07/2016  . Pain of left heel 06/02/2016  . Closed fracture of lateral portion of left tibial plateau 03/10/2016  . Degenerative cervical disc 03/04/2016  . Gastroesophageal reflux disease without esophagitis 02/01/2016  . Primary osteoarthritis  of right hand 02/01/2016  . Paresthesias in left hand 02/01/2016  . SOB (shortness of breath) 10/05/2013  . COPD (chronic obstructive pulmonary disease) (HCC) 10/05/2013  . History of smoking 10/05/2013  . Hyperlipidemia 10/05/2013  . Hypothyroidism 10/05/2013    Past Surgical History:  Procedure Laterality Date  . ABDOMINAL HYSTERECTOMY    . CARPAL TUNNEL RELEASE Right   . ORIF TIBIA PLATEAU Left 03/10/2016   Procedure: OPEN REDUCTION INTERNAL FIXATION (ORIF) BICONDYLAR TIBIAL PLATEAU FRACTURE;  Surgeon: Eldred Manges, MD;  Location: [facility] OR;  Service: Orthopedics;  Laterality: Left;  . SHOULDER ARTHROSCOPY W/ ROTATOR CUFF REPAIR Right   . TONSILLECTOMY AND ADENOIDECTOMY      Family History  Problem Relation Age of Onset  . Hypertension Daughter   . Diabetes Father   . Heart disease Father   . Breast cancer Maternal Aunt 60  . Breast cancer Paternal Aunt 29  . Depression Sister     Social History   Social History  . Marital status: Widowed    Spouse name: N/A  . Number of children: N/A  . Years of education: N/A   Occupational History  . Not on file.   Social History Main Topics  . Smoking status: Former Smoker    Packs/day: 2.00    Years: 30.00    Quit date: 09/03/2008  . Smokeless tobacco: Never Used  . Alcohol  use No  . Drug use: No  . Sexual activity: No   Other Topics Concern  . Not on file   Social History Narrative   She works at [employer], but had a left knee work related injury 03/10/2016 , require left knee surgery and is now having back pain, that is getting evaluated, Out of work since.      Current Outpatient Prescriptions:  .  acyclovir (ZOVIRAX) 400 MG tablet, TAKE 1 TABLET(400 MG) BY MOUTH DAILY, Disp: 30 tablet, Rfl: 0 .  albuterol (PROVENTIL) (2.5 MG/3ML) 0.083% nebulizer solution, Take 3 mLs (2.5 mg total) by nebulization every 6 (six) hours as needed for wheezing or shortness of breath., Disp: 75 mL, Rfl: 12 .  ASPIRIN LOW DOSE 81 MG EC  tablet, TK 1 T PO QD, Disp: , Rfl: 0 .  Cholecalciferol (VITAMIN D PO), Take 1 capsule by mouth daily., Disp: , Rfl:  .  diclofenac sodium (VOLTAREN) 1 % GEL, Apply 4 g topically 2 (two) times daily as needed., Disp: 2 Tube, Rfl: 1 .  DULoxetine (CYMBALTA) 30 MG capsule, Take 3 capsules (90 mg total) by mouth daily., Disp: 270 capsule, Rfl: 1 .  gabapentin (NEURONTIN) 300 MG capsule, Take 1 capsule (300 mg total) by mouth 3 (three) times daily., Disp: 270 capsule, Rfl: 1 .  Glycopyrrolate-Formoterol (BEVESPI AEROSPHERE) 9-4.8 MCG/ACT AERO, Inhale 2 puffs into the lungs 2 (two) times daily., Disp: 10.7 g, Rfl: 5 .  levothyroxine (SYNTHROID, LEVOTHROID) 88 MCG tablet, Take 1 tablet (88 mcg total) by mouth daily before breakfast., Disp: 90 tablet, Rfl: 1 .  Magnesium Oxide 400 MG CAPS, Take 1 capsule (400 mg total) by mouth once., Disp: 30 capsule, Rfl: 5 .  meloxicam (MOBIC) 15 MG tablet, , Disp: , Rfl: 1 .  montelukast (SINGULAIR) 10 MG tablet, Take 1 tablet (10 mg total) by mouth daily., Disp: 90 tablet, Rfl: 1 .  ranitidine (ZANTAC) 150 MG tablet, Take 1 tablet (150 mg total) by mouth 2 (two) times daily. In place of Omeprazole, Disp: 180 tablet, Rfl: 1 .  simvastatin (ZOCOR) 40 MG tablet, Take 1 tablet (40 mg total) by mouth at bedtime., Disp: 90 tablet, Rfl: 1  No Known Allergies   ROS  Ten systems reviewed and is negative except as mentioned in HPI   Objective  Vitals:   12/04/16 0751  BP: 116/72  Pulse: (!) 111  Resp: 16  Temp: 98.1 F (36.7 C)  TempSrc: Oral  SpO2: 95%  Weight: 195 lb 3.2 oz (88.5 kg)  Height: 4\' 9"  (1.448 m)    Body mass index is 42.24 kg/m.  Physical Exam  Constitutional: Patient appears well-developed and well-nourished. Obese No distress.  HEENT: head atraumatic, normocephalic, pupils equal and reactive to light, neck supple, throat within normal limits Cardiovascular: Normal rate, regular rhythm and normal heart sounds.  No murmur heard. No BLE  edema. Pulmonary/Chest: Effort normal and breath sounds normal. No respiratory distress. Abdominal: Soft.  There is no tenderness. Psychiatric: Patient has a depressed mood/cried during her visit. behavior is normal. Judgment and thought content normal.   Recent Results (from the past 2160 hour(s))  POCT UA - Microalbumin     Status: Normal   Collection Time: 11/06/16 12:51 PM  Result Value Ref Range   Microalbumin Ur, POC neg mg/L   Creatinine, POC  mg/dL   Albumin/Creatinine Ratio, Urine, POC      PHQ2/9: Depression screen [facility] 2/9 12/04/2016 08/07/2016 02/01/2016 04/30/2015 03/08/2015  Decreased Interest  1 0 0 0 0  Down, Depressed, Hopeless 3 0 0 0 0  PHQ - 2 Score 4 0 0 0 0  Altered sleeping 3 - - - -  Tired, decreased energy 2 - - - -  Change in appetite 0 - - - -  Feeling bad or failure about yourself  0 - - - -  Trouble concentrating 1 - - - -  Moving slowly or fidgety/restless 2 - - - -  Suicidal thoughts 0 - - - -  PHQ-9 Score 12 - - - -  Difficult doing work/chores Very difficult - - - -     Fall Risk: Fall Risk  12/04/2016 08/07/2016 02/01/2016 06/26/2015 04/30/2015  Falls in the past year? Yes Yes No No No  Number falls in past yr: 1 1 - - -  Injury with Fall? Yes Yes - - -  Follow up - Falls evaluation completed - - -     Functional Status Survey: Is the patient deaf or have difficulty hearing?: No Does the patient have difficulty seeing, even when wearing glasses/contacts?: No Does the patient have difficulty concentrating, remembering, or making decisions?: No Does the patient have difficulty walking or climbing stairs?: No Does the patient have difficulty dressing or bathing?: No Does the patient have difficulty doing errands alone such as visiting a doctor's office or shopping?: No    Assessment & Plan  1. Moderate episode of recurrent major depressive disorder (HCC)  - DULoxetine (CYMBALTA) 30 MG capsule; Take 3 capsules (90 mg total) by mouth daily.   Dispense: 270 capsule; Refill: 1  She got laid off, she is in a lot of pain daily, advised to add counseling and discuss pain control with workman's comp   2. Other allergic rhinitis  - montelukast (SINGULAIR) 10 MG tablet; Take 1 tablet (10 mg total) by mouth daily.  Dispense: 90 tablet; Refill: 1  3. Anemia, unspecified type  - CBC with Differential/Platelet  4. Encounter for hepatitis C screening test for low risk patient  - Hepatitis C antibody  5. Encounter for screening for HIV  - HIV antibody

## 2016-12-04 NOTE — Patient Instructions (Signed)
Suicidal Feelings: How to Help Yourself °Suicide is the taking of one's own life. If you feel as though life is getting too tough to handle and are thinking about suicide, get help right away. To get help: °· Call your local emergency services (911 in the [place] °· Call a suicide hotline to speak with a trained counselor who understands how you are feeling. The following is a list of suicide hotlines in the [place]. For a list of hotlines in [place], visit www.suicide.org/hotlines/international/[place]-suicide-hotlines.html. °¨ 1-800-273-TALK (1-800-273-8255). °¨ 1-800-SUICIDE (1-800-784-2433). °¨ 1-888-628-9454. This is a hotline for Spanish speakers. °¨ 1-800-799-4TTY (1-800-799-4889). This is a hotline for TTY users. °¨ 1-866-4-U-TREVOR (1-866-488-7386). This is a hotline for lesbian, gay, bisexual, transgender, or questioning youth. °· Contact a crisis center or a local suicide prevention center. To find a crisis center or suicide prevention center: °¨ Call your local hospital, clinic, community service organization, mental health center, social service provider, or health department. Ask for assistance in connecting to a crisis center. °¨ Visit www.suicidepreventionlifeline.org/getinvolved/locator for a list of crisis centers in the [place], or visit www.suicideprevention.[place]/thinking-about-suicide/find-a-crisis-centre for a list of centers in [place]. °· Visit the following websites: °¨ National Suicide Prevention Lifeline: www.suicidepreventionlifeline.org °¨ Hopeline: www.hopeline.com °¨ American Foundation for Suicide Prevention: www.afsp.org °¨ The Trevor Project (for lesbian, gay, bisexual, transgender, or questioning youth): www.thetrevorproject.org °How can I help myself feel better? °· Promise yourself that you will not do anything drastic when you have suicidal feelings. Remember, there is hope. Many people have gotten through suicidal thoughts and feelings, and you will, too. You may have  gotten through them before, and this proves that you can get through them again. °· Let family, friends, teachers, or counselors know how you are feeling. Try not to isolate yourself from those who care about you. Remember, they will want to help you. Talk with someone every day, even if you do not feel sociable. Face-to-face conversation is best. °· Call a mental health professional and see one regularly. °· Visit your primary health care provider every year. °· Eat a well-balanced diet, and space your meals so you eat regularly. °· Get plenty of rest. °· Avoid alcohol and drugs, and remove them from your home. They will only make you feel worse. °· If you are thinking of taking a lot of medicine, give your medicine to someone who can give it to you one day at a time. If you are on antidepressants and are concerned you will overdose, let your health care provider know so he or she can give you safer medicines. Ask your mental health professional about the possible side effects of any medicines you are taking. °· Remove weapons, poisons, knives, and anything else that could harm you from your home. °· Try to stick to routines. Follow a schedule every day. Put self-care on your schedule. °· Make a list of realistic goals, and cross them off when you achieve them. Accomplishments give a sense of worth. °· Wait until you are feeling better before doing the things you find difficult or unpleasant. °· Exercise if you are able. You will feel better if you exercise for even a half hour each day. °· Go out in the sun or into nature. This will help you recover from depression faster. If you have a favorite place to walk, go there. °· Do the things that have always given you pleasure. Play your favorite music, read a good book, paint a picture, play your favorite instrument, or do anything   else that takes your mind off your depression if it is safe to do. °· Keep your living space well lit. °· When you are feeling well, write  yourself a letter about tips and support that you can read when you are not feeling well. °· Remember that life’s difficulties can be sorted out with help. Conditions can be treated. You can work on thoughts and strategies that serve you well. °This information is not intended to replace advice given to you by your health care provider. Make sure you discuss any questions you have with your health care provider. °Document Released: 01/18/2003 Document Revised: 03/12/2016 Document Reviewed: 11/08/2013 °Elsevier Interactive Patient Education © 2017 [employer] ° °

## 2016-12-19 ENCOUNTER — Telehealth (INDEPENDENT_AMBULATORY_CARE_PROVIDER_SITE_OTHER): Payer: Self-pay

## 2016-12-19 NOTE — Telephone Encounter (Signed)
He did sign off for her to have this. A copy of the paperwork that he signed is under the Media tab if they need that. I was not aware that this had been discussed at visit. That is why I do not have actual script.

## 2016-12-19 NOTE — Telephone Encounter (Signed)
Received vm from Robin @ [employer] stating she has [facility] and is questioning if Dr. Ophelia CharterYates ordered this?

## 2017-01-08 ENCOUNTER — Ambulatory Visit: Payer: Self-pay | Admitting: Family Medicine

## 2017-01-19 ENCOUNTER — Telehealth: Payer: Self-pay

## 2017-01-19 NOTE — Telephone Encounter (Signed)
Patient wanted to ask about a referral to a back specialists [place]. Patient did go to [place] but workers comp will only file her knee troubles. Her private Insurance will be used for her back and having a hard time walking with the problems it gives her. She did schedule an upcoming appointment on 01/26/17.

## 2017-01-20 ENCOUNTER — Other Ambulatory Visit: Payer: Self-pay | Admitting: Family Medicine

## 2017-01-20 NOTE — Telephone Encounter (Signed)
Patient requesting refill of aspirin to [facility].  

## 2017-01-21 LAB — VITAMIN B12: Vitamin B-12: >2000

## 2017-01-21 LAB — HEPATIC FUNCTION PANEL
ALT: 24 (ref 7–35)
AST: 15 (ref 13–35)

## 2017-01-21 LAB — HM HIV SCREENING LAB: HM HIV SCREENING: NEGATIVE

## 2017-01-21 LAB — CBC AND DIFFERENTIAL
HCT: 42 (ref 36–46)
HEMOGLOBIN: 13.6 (ref 12.0–16.0)
PLATELETS: 252 (ref 150–399)

## 2017-01-21 LAB — BASIC METABOLIC PANEL
BUN: 13 (ref 4–21)
GLUCOSE: 109
Potassium: 4.6 (ref 3.4–5.3)
Sodium: 142 (ref 137–147)

## 2017-01-21 LAB — TSH: TSH: 0.95 (ref 0.41–5.90)

## 2017-01-21 LAB — HEMOGLOBIN A1C: HEMOGLOBIN A1C: 6

## 2017-01-21 LAB — LIPID PANEL
HDL: 48 (ref 35–70)
LDL Cholesterol: 56
Triglycerides: 96 (ref 40–160)

## 2017-01-21 LAB — HM HEPATITIS C SCREENING LAB: HM Hepatitis Screen: NEGATIVE

## 2017-01-26 ENCOUNTER — Other Ambulatory Visit: Payer: Self-pay | Admitting: Family Medicine

## 2017-01-26 ENCOUNTER — Encounter: Payer: Self-pay | Admitting: Family Medicine

## 2017-01-26 ENCOUNTER — Ambulatory Visit (INDEPENDENT_AMBULATORY_CARE_PROVIDER_SITE_OTHER): Payer: BLUE CROSS/BLUE SHIELD | Admitting: Family Medicine

## 2017-01-26 VITALS — BP 118/80 | HR 115 | Temp 98.0°F | Resp 18 | Ht <= 58 in | Wt 191.5 lb

## 2017-01-26 DIAGNOSIS — Z23 Encounter for immunization: Secondary | ICD-10-CM

## 2017-01-26 DIAGNOSIS — E1169 Type 2 diabetes mellitus with other specified complication: Secondary | ICD-10-CM

## 2017-01-26 DIAGNOSIS — E669 Obesity, unspecified: Secondary | ICD-10-CM

## 2017-01-26 DIAGNOSIS — J449 Chronic obstructive pulmonary disease, unspecified: Secondary | ICD-10-CM | POA: Diagnosis not present

## 2017-01-26 DIAGNOSIS — F331 Major depressive disorder, recurrent, moderate: Secondary | ICD-10-CM

## 2017-01-26 DIAGNOSIS — E038 Other specified hypothyroidism: Secondary | ICD-10-CM | POA: Diagnosis not present

## 2017-01-26 DIAGNOSIS — E785 Hyperlipidemia, unspecified: Secondary | ICD-10-CM

## 2017-01-26 DIAGNOSIS — B001 Herpesviral vesicular dermatitis: Secondary | ICD-10-CM | POA: Diagnosis not present

## 2017-01-26 DIAGNOSIS — M48062 Spinal stenosis, lumbar region with neurogenic claudication: Secondary | ICD-10-CM | POA: Diagnosis not present

## 2017-01-26 MED ORDER — ATORVASTATIN CALCIUM 40 MG PO TABS: 40.0000 mg | ORAL_TABLET | Freq: Every day | ORAL | 1 refills | Status: DC

## 2017-01-26 MED ORDER — ACYCLOVIR 400 MG PO TABS: ORAL_TABLET | ORAL | 0 refills | Status: DC

## 2017-01-26 MED ORDER — DULAGLUTIDE 1.5 MG/0.5ML ~~LOC~~ SOAJ: 1.5000 mg | SUBCUTANEOUS | 2 refills | Status: DC

## 2017-01-26 MED ORDER — LEVOTHYROXINE SODIUM 88 MCG PO TABS: 88.0000 ug | ORAL_TABLET | Freq: Every day | ORAL | 1 refills | Status: DC

## 2017-01-26 NOTE — Progress Notes (Signed)
Name: Sara Andrews   MRN: [id number]    DOB: September 13, 1955   Date:01/26/2017       Progress Note  Subjective  Chief Complaint  Chief Complaint  Patient presents with  . Depression    pt stated that she cries over just about everything  . COPD  . Pain    back has appointment with specialist in August    HPI  Major Depression: long history of depression and taking Zoloft since her husband died in Dec 09, 2006. She was taking 200 mg and was still very depressed, we added Wellbutrin XL Fall 2018-7 without help, and switched to Duloxetine April 2018 because of increase in depressed and pain associated with Workman's comp injury, she is still crying daily, anhedonia, difficulty sleeping at night, no suicidal thoughts or ideation.  She is under more stress since workman's comp injury - fracture left tibial plateau, also now in a lot of pain secondary to spinal stenosis. She agrees on seeing psychiatrist .   DDD and spinal stenosis lumbar spine: on gabapentin, seen by ortho and advised surgery but she would like second opinion from another workman's comp doctor, also recommended knee surgery, waiting to find out if numbness on legs secondary to workman's comp related injury. . Pain is affecting her ability to sleep, pain is constant on buttocks and posterior thighs, it is described as burning and at times causes her inability to move. Average pain is 10/10.   DDD cervical and paresthesia left arm: she has gone to urgent care since 09-Dec-2022 twice for numbness left hand, pain on left arm and had X-ray of her neck that showed DDD cervical spine, she has an appointment scheduled with Dr. Malvin Johns that is a neurologist, visits to Urgent Care. Still has paresthesias on hands  B12 deficiency: not taking supplementation, feels tired, she ran out of supplements, but last B12 at goal   COPD: she is on Bevespi since Dec 09, 2015. She quit smoking in 09-Dec-2007. She has chronic SOB,cough that is  occasionally productive, she also  has wheezing.   GERD: she is off Omeprazole and is taking Ranitidine prn, doing well at this time  Fever blisters: she has outbreaks usually during the Summer taking Acyclovir.  Hypothyroidism: reviewed labs and TSH at goal, continue current dose  DM II : diagnosed at work back in 2010/12/09 she used to take medications, last hgbA1C was 6.2%, down to 6.0% , elevated fasting insulin, struggling with weight loss.  She denies polyphagia, polyuria , she has polydipsia. She needs eye exam   Patient Active Problem List   Diagnosis Date Noted  . Spondylosis of lumbar spine 11/06/2016  . Spinal stenosis of lumbar region with neurogenic claudication 10/28/2016  . Chronic bilateral low back pain with bilateral sciatica 09/16/2016  . Hyperglycemia 08/07/2016  . Moderate episode of recurrent major depressive disorder (HCC) 08/07/2016  . Pain of left heel 06/02/2016  . Closed fracture of lateral portion of left tibial plateau 03/10/2016  . Degenerative cervical disc 03/04/2016  . Gastroesophageal reflux disease without esophagitis 02/01/2016  . Primary osteoarthritis of right hand 02/01/2016  . Paresthesias in left hand 02/01/2016  . SOB (shortness of breath) 10/05/2013  . COPD (chronic obstructive pulmonary disease) (HCC) 10/05/2013  . History of smoking 10/05/2013  . Hyperlipidemia 10/05/2013  . Hypothyroidism 10/05/2013    Past Surgical History:  Procedure Laterality Date  . ABDOMINAL HYSTERECTOMY    . CARPAL TUNNEL RELEASE Right   . ORIF TIBIA PLATEAU Left 03/10/2016   Procedure:  OPEN REDUCTION INTERNAL FIXATION (ORIF) BICONDYLAR TIBIAL PLATEAU FRACTURE;  Surgeon: Eldred MangesMark C Yates, MD;  Location: [facility] OR;  Service: Orthopedics;  Laterality: Left;  . SHOULDER ARTHROSCOPY W/ ROTATOR CUFF REPAIR Right   . TONSILLECTOMY AND ADENOIDECTOMY      Family History  Problem Relation Age of Onset  . Hypertension Daughter   . Diabetes Father   . Heart disease Father   . Breast cancer Maternal Aunt 60   . Breast cancer Paternal Aunt [id number]  . Depression Sister     Social History   Social History  . Marital status: Widowed    Spouse name: N/A  . Number of children: N/A  . Years of education: N/A   Occupational History  . Not on file.   Social History Main Topics  . Smoking status: Former Smoker    Packs/day: 2.00    Years: 30.00    Quit date: 09/03/2008  . Smokeless tobacco: Never Used  . Alcohol use No  . Drug use: No  . Sexual activity: No   Other Topics Concern  . Not on file   Social History Narrative   She used to work at  [employer], but had a left knee work related injury 03/10/2016 , require left knee surgery and is now having back pain, that is getting evaluated, Out of work since.      Current Outpatient Prescriptions:  .  acyclovir (ZOVIRAX) 400 MG tablet, TAKE 1 TABLET(400 MG) BY MOUTH DAILY, Disp: 30 tablet, Rfl: 0 .  albuterol (PROVENTIL) (2.5 MG/3ML) 0.083% nebulizer solution, Take 3 mLs (2.5 mg total) by nebulization every 6 (six) hours as needed for wheezing or shortness of breath., Disp: 75 mL, Rfl: 12 .  ASPIRIN LOW DOSE 81 MG EC tablet, TAKE 1 TABLET BY MOUTH EVERY DAY, Disp: 90 tablet, Rfl: 0 .  atorvastatin (LIPITOR) 40 MG tablet, Take 1 tablet (40 mg total) by mouth daily. In place of simvastatin, Disp: 90 tablet, Rfl: 1 .  Cholecalciferol (VITAMIN D PO), Take 1 capsule by mouth daily., Disp: , Rfl:  .  diclofenac sodium (VOLTAREN) 1 % GEL, Apply 4 g topically 2 (two) times daily as needed., Disp: 2 Tube, Rfl: 1 .  DULoxetine (CYMBALTA) 30 MG capsule, Take 3 capsules (90 mg total) by mouth daily., Disp: 270 capsule, Rfl: 1 .  gabapentin (NEURONTIN) 300 MG capsule, Take 1 capsule (300 mg total) by mouth 3 (three) times daily., Disp: 270 capsule, Rfl: 1 .  Glycopyrrolate-Formoterol (BEVESPI AEROSPHERE) 9-4.8 MCG/ACT AERO, Inhale 2 puffs into the lungs 2 (two) times daily., Disp: 10.7 g, Rfl: 5 .  levothyroxine (SYNTHROID, LEVOTHROID) 88 MCG tablet, Take 1  tablet (88 mcg total) by mouth daily before breakfast., Disp: 90 tablet, Rfl: 1 .  Magnesium Oxide 400 MG CAPS, Take 1 capsule (400 mg total) by mouth once., Disp: 30 capsule, Rfl: 5 .  meloxicam (MOBIC) 15 MG tablet, , Disp: , Rfl: 1 .  montelukast (SINGULAIR) 10 MG tablet, Take 1 tablet (10 mg total) by mouth daily., Disp: 90 tablet, Rfl: 1 .  ranitidine (ZANTAC) 150 MG tablet, Take 1 tablet (150 mg total) by mouth 2 (two) times daily. In place of Omeprazole, Disp: 180 tablet, Rfl: 1  No Known Allergies   ROS  Constitutional: Negative for fever or weight change.  Respiratory: Positive  for cough and shortness of breath with activity .   Cardiovascular: Negative for chest pain or palpitations.  Gastrointestinal: Negative for abdominal pain, no bowel changes.  Musculoskeletal: Positive for gait problem no  joint swelling.  Skin: Negative for rash.  Neurological: Negative for dizziness or headache.  No other specific complaints in a complete review of systems (except as listed in HPI above).   Objective  Vitals:   01/26/17 1025  BP: 118/80  Pulse: (!) 115  Resp: 18  Temp: 98 F (36.7 C)  SpO2: 97%  Weight: 191 lb 8 oz (86.9 kg)  Height: 4\' 9"  (1.448 m)    Body mass index is 41.44 kg/m.  Physical Exam  Constitutional: Patient appears well-developed and well-nourished. Obese.No distress.  HEENT: head atraumatic, normocephalic, pupils equal and reactive to light, neck supple, throat within normal limits Cardiovascular: Normal rate, regular rhythm and normal heart sounds.  No murmur heard. No BLE edema. Pulmonary/Chest: Effort normal and breath sounds normal. No respiratory distress. Abdominal: Soft.  There is no tenderness. Psychiatric: Patient has a depressed mood,  behavior is normal. Judgment and thought content normal. Muscular skeletal:   Recent Results (from the past 2160 hour(s))  POCT UA - Microalbumin     Status: Normal   Collection Time: 11/06/16 12:51 PM   Result Value Ref Range   Microalbumin Ur, POC neg mg/L   Creatinine, POC  mg/dL   Albumin/Creatinine Ratio, Urine, POC    [facility] HIV SCREENING LAB     Status: None   Collection Time: 01/21/17 12:00 AM  Result Value Ref Range   HM HIV Screening Negative - Validated   HM HEPATITIS C SCREENING LAB     Status: None   Collection Time: 01/21/17 12:00 AM  Result Value Ref Range   HM Hepatitis Screen Negative-Validated   CBC and differential     Status: None   Collection Time: 01/21/17 12:00 AM  Result Value Ref Range   Hemoglobin 13.6 12.0 - 16.0   HCT 42 36 - 46   Platelets 252 150 - 399  Basic metabolic panel     Status: None   Collection Time: 01/21/17 12:00 AM  Result Value Ref Range   Glucose 109    BUN 13 4 - 21   Potassium 4.6 3.4 - 5.3   Sodium 142 137 - 147  Lipid panel     Status: None   Collection Time: 01/21/17 12:00 AM  Result Value Ref Range   Triglycerides 96 40 - 160   HDL 48 35 - 70   LDL Cholesterol 56   Hepatic function panel     Status: None   Collection Time: 01/21/17 12:00 AM  Result Value Ref Range   ALT 24 7 - 35   AST 15 13 - 35  Vitamin B12     Status: None   Collection Time: 01/21/17 12:00 AM  Result Value Ref Range   Vitamin B-12 >2,000   Hemoglobin A1c     Status: None   Collection Time: 01/21/17 12:00 AM  Result Value Ref Range   Hemoglobin A1C 6.0   TSH     Status: None   Collection Time: 01/21/17 12:00 AM  Result Value Ref Range   TSH 0.95 0.41 - 5.90     PHQ2/9: Depression screen [facility] 2/9 12/04/2016 08/07/2016 02/01/2016 04/30/2015 03/08/2015  Decreased Interest 1 0 0 0 0  Down, Depressed, Hopeless 3 0 0 0 0  PHQ - 2 Score 4 0 0 0 0  Altered sleeping 3 - - - -  Tired, decreased energy 2 - - - -  Change in appetite 0 - - - -  Feeling bad or failure about yourself  0 - - - -  Trouble concentrating 1 - - - -  Moving slowly or fidgety/restless 2 - - - -  Suicidal thoughts 0 - - - -  PHQ-9 Score 12 - - - -  Difficult doing work/chores Very  difficult - - - -     Fall Risk: Fall Risk  12/04/2016 08/07/2016 02/01/2016 06/26/2015 04/30/2015  Falls in the past year? Yes Yes No No No  Number falls in past yr: 1 1 - - -  Injury with Fall? Yes Yes - - -  Follow up - Falls evaluation completed - - -      Assessment & Plan   1. Diabetes mellitus type 2 in obese [facility])  She has taken Metformin in the past, but did not like it.  - Dulaglutide (TRULICITY) 1.5 MG/0.5ML SOPN; Inject 1.5 mg into the skin once a week.  Dispense: 4 pen; Refill: 2  2. Moderate episode of recurrent major depressive disorder (HCC)  - Ambulatory referral to Psychiatry  3. Other specified hypothyroidism  - levothyroxine (SYNTHROID, LEVOTHROID) 88 MCG tablet; Take 1 tablet (88 mcg total) by mouth daily before breakfast.  Dispense: 90 tablet; Refill: 1  4. Fever blister  - acyclovir (ZOVIRAX) 400 MG tablet; TAKE 1 TABLET(400 MG) BY MOUTH DAILY  Dispense: 30 tablet; Refill: 0  5. Spinal stenosis of lumbar region with neurogenic claudication  Getting second opinion with Dr. Noel Gerold in [place] in August  6. Dyslipidemia  - atorvastatin (LIPITOR) 40 MG tablet; Take 1 tablet (40 mg total) by mouth daily. In place of simvastatin  Dispense: 90 tablet; Refill: 1  7. Need for Tdap vaccination  - Tdap vaccine greater than or equal to 7yo IM

## 2017-01-29 ENCOUNTER — Other Ambulatory Visit: Payer: Self-pay | Admitting: Family Medicine

## 2017-01-29 DIAGNOSIS — F331 Major depressive disorder, recurrent, moderate: Secondary | ICD-10-CM

## 2017-01-29 DIAGNOSIS — J3089 Other allergic rhinitis: Secondary | ICD-10-CM

## 2017-01-30 ENCOUNTER — Telehealth: Payer: Self-pay

## 2017-01-30 ENCOUNTER — Other Ambulatory Visit: Payer: Self-pay | Admitting: Family Medicine

## 2017-01-30 MED ORDER — INSULIN PEN NEEDLE 30G X 8 MM MISC: 1.0000 | 2 refills | Status: DC | PRN

## 2017-01-30 MED ORDER — LIRAGLUTIDE 18 MG/3ML ~~LOC~~ SOPN: 0.6000 mg | PEN_INJECTOR | Freq: Every day | SUBCUTANEOUS | 2 refills | Status: DC

## 2017-01-30 NOTE — Telephone Encounter (Signed)
Insurance would not cover Trulicity but will cover Victoza, called patient and she is willing to try Victoza. Please send in new prescription.

## 2017-04-28 ENCOUNTER — Encounter: Payer: Self-pay | Admitting: Family Medicine

## 2017-04-28 ENCOUNTER — Ambulatory Visit (INDEPENDENT_AMBULATORY_CARE_PROVIDER_SITE_OTHER): Payer: BLUE CROSS/BLUE SHIELD | Admitting: Family Medicine

## 2017-04-28 VITALS — BP 108/64 | HR 98 | Temp 98.6°F | Resp 16 | Ht <= 58 in | Wt 189.6 lb

## 2017-04-28 DIAGNOSIS — E669 Obesity, unspecified: Secondary | ICD-10-CM

## 2017-04-28 DIAGNOSIS — K5903 Drug induced constipation: Secondary | ICD-10-CM | POA: Diagnosis not present

## 2017-04-28 DIAGNOSIS — K219 Gastro-esophageal reflux disease without esophagitis: Secondary | ICD-10-CM

## 2017-04-28 DIAGNOSIS — L989 Disorder of the skin and subcutaneous tissue, unspecified: Secondary | ICD-10-CM

## 2017-04-28 DIAGNOSIS — Z1211 Encounter for screening for malignant neoplasm of colon: Secondary | ICD-10-CM | POA: Diagnosis not present

## 2017-04-28 DIAGNOSIS — E038 Other specified hypothyroidism: Secondary | ICD-10-CM | POA: Diagnosis not present

## 2017-04-28 DIAGNOSIS — Z23 Encounter for immunization: Secondary | ICD-10-CM

## 2017-04-28 DIAGNOSIS — E1169 Type 2 diabetes mellitus with other specified complication: Secondary | ICD-10-CM

## 2017-04-28 DIAGNOSIS — J3089 Other allergic rhinitis: Secondary | ICD-10-CM

## 2017-04-28 DIAGNOSIS — F331 Major depressive disorder, recurrent, moderate: Secondary | ICD-10-CM

## 2017-04-28 DIAGNOSIS — E785 Hyperlipidemia, unspecified: Secondary | ICD-10-CM

## 2017-04-28 MED ORDER — OMEPRAZOLE 40 MG PO CPDR: 40.0000 mg | DELAYED_RELEASE_CAPSULE | Freq: Every day | ORAL | 1 refills | Status: DC

## 2017-04-28 MED ORDER — POLYETHYLENE GLYCOL 3350 17 GM/SCOOP PO POWD: 17.0000 g | Freq: Two times a day (BID) | ORAL | 1 refills | Status: DC | PRN

## 2017-04-28 MED ORDER — DULOXETINE HCL 30 MG PO CPEP: 90.0000 mg | ORAL_CAPSULE | Freq: Every day | ORAL | 0 refills | Status: DC

## 2017-04-28 MED ORDER — FLUOCINONIDE 0.05 % EX SOLN: 1.0000 "application " | Freq: Two times a day (BID) | CUTANEOUS | 0 refills | Status: DC

## 2017-04-28 MED ORDER — SEMAGLUTIDE(0.25 OR 0.5MG/DOS) 2 MG/1.5ML ~~LOC~~ SOPN: 0.5000 mg | PEN_INJECTOR | SUBCUTANEOUS | 2 refills | Status: DC

## 2017-04-28 MED ORDER — MONTELUKAST SODIUM 10 MG PO TABS: ORAL_TABLET | ORAL | 1 refills | Status: DC

## 2017-04-28 NOTE — Progress Notes (Signed)
Name: Sara Andrews   MRN: [id number]    DOB: February 17, 1956   Date:04/28/2017       Progress Note  Subjective  Chief Complaint  Chief Complaint  Patient presents with  . Medication Refill    3 month F/U  . Diabetes    Trulicity has been giving her alot of nausea and indigestion  . Depression    Psychiatrist called but has been booked out until September in [place], but October 10 one will be opening up in [place]. Will call patient soon as possible. Unchanged  . COPD  . Gastroesophageal Reflux  . Hypothyroidism  . DDD    HPI  Major Depression: long history of depression and taking Zoloft since her husband died in Dec 18, 2006. She was taking 200 mg and was still very depressed, we added Wellbutrin XL Fall 2018-7 without help, and switched to Duloxetine April 2018 because of increase in depressed and pain associated with Workman's comp injury, she is still crying daily, anhedonia, difficulty sleeping at night, no suicidal thoughts or ideation.  She is under more stress since workman's comp injury - fracture left tibial plateau, also now in a lot of pain secondary to spinal stenosis. We made referral to psychiatrist 3 months ago, but unable to go to [place], waiting for [facility] to contact her. She denies suicidal thoughts or ideation.  DDD and spondylolisthesis: on gabapentin, seen by ortho and advised surgery but she would like second opinion from another workman's comp doctor, also recommended knee surgery. She is now seeing Dr. Delila Pereyra, taking Naproxen and Baclofen with some improvement of pain, still waiting to see if needs back surgery. She is also going to get steroid injection. Pain right now is 4/10  B12 deficiency: not taking supplementation, feels tired, she ran out of supplements, but last B12 at goal   COPD: she is on Bevespi since 2015-12-18. She quit smoking in 18-Dec-2007. She has chronic cough that is  occasionally productive, she also has wheezing. Denies SOB at this time.  GERD:  she is off Omeprazole and is taking Ranitidine however states symptoms are getting much worse, having eructation and also heartburn, since started on trulicity has noticed early satiety - but explained that GLP-1 agonist can cause early satiety. She is aware of long term side effects of PPI, but she states she needs relieve.   Fever blisters: she has outbreaks usually during the Summer taking Acyclovir.  Hypothyroidism: reviewed labs and TSH at goal, continue current dose, she has noticed worsening of constipation, it may be secondary to Trulicity, needs to increase fiber and water, walk more and needs cologuard ( Refuses colonoscopy at this time) Try otc Miralax.  DM II : diagnosed at work back in 2012she used to take medications, last hgbA1C was 6.2%, down to 6.0% , elevated fasting insulin, struggling with weight loss.  She denies polyphagia, polyuria , she has polydipsia. She needs eye exam. Started on Trulicity June 2018, having some constipation and indigestion. Discussed high fiber diet and we will try to change to Ozempic    Patient Active Problem List   Diagnosis Date Noted  . Spondylosis of lumbar spine 11/06/2016  . Spinal stenosis of lumbar region with neurogenic claudication 10/28/2016  . Chronic bilateral low back pain with bilateral sciatica 09/16/2016  . Hyperglycemia 08/07/2016  . Moderate episode of recurrent major depressive disorder (HCC) 08/07/2016  . Pain of left heel 06/02/2016  . Closed fracture of lateral portion of left tibial plateau 03/10/2016  . Degenerative  cervical disc 03/04/2016  . Gastroesophageal reflux disease without esophagitis 02/01/2016  . Primary osteoarthritis of right hand 02/01/2016  . Paresthesias in left hand 02/01/2016  . SOB (shortness of breath) 10/05/2013  . COPD (chronic obstructive pulmonary disease) (HCC) 10/05/2013  . History of smoking 10/05/2013  . Hyperlipidemia 10/05/2013  . Hypothyroidism 10/05/2013    Past Surgical History:   Procedure Laterality Date  . ABDOMINAL HYSTERECTOMY    . CARPAL TUNNEL RELEASE Right   . ORIF TIBIA PLATEAU Left 03/10/2016   Procedure: OPEN REDUCTION INTERNAL FIXATION (ORIF) BICONDYLAR TIBIAL PLATEAU FRACTURE;  Surgeon: Eldred Manges, MD;  Location: [facility] OR;  Service: Orthopedics;  Laterality: Left;  . SHOULDER ARTHROSCOPY W/ ROTATOR CUFF REPAIR Right   . TONSILLECTOMY AND ADENOIDECTOMY      Family History  Problem Relation Age of Onset  . Hypertension Daughter   . Diabetes Father   . Heart disease Father   . Breast cancer Maternal Aunt 60  . Breast cancer Paternal Aunt 37  . Depression Sister     Social History   Social History  . Marital status: Widowed    Spouse name: N/A  . Number of children: N/A  . Years of education: N/A   Occupational History  . Not on file.   Social History Main Topics  . Smoking status: Former Smoker    Packs/day: 2.00    Years: 30.00    Quit date: 09/03/2008  . Smokeless tobacco: Never Used  . Alcohol use No  . Drug use: No  . Sexual activity: No   Other Topics Concern  . Not on file   Social History Narrative   She used to work at  [employer], but had a left knee work related injury 03/10/2016 , require left knee surgery and is now having back pain, that is getting evaluated, Out of work since.      Current Outpatient Prescriptions:  .  acyclovir (ZOVIRAX) 400 MG tablet, TAKE 1 TABLET(400 MG) BY MOUTH DAILY, Disp: 30 tablet, Rfl: 0 .  albuterol (PROVENTIL) (2.5 MG/3ML) 0.083% nebulizer solution, Take 3 mLs (2.5 mg total) by nebulization every 6 (six) hours as needed for wheezing or shortness of breath., Disp: 75 mL, Rfl: 12 .  atorvastatin (LIPITOR) 40 MG tablet, Take 1 tablet (40 mg total) by mouth daily. In place of simvastatin, Disp: 90 tablet, Rfl: 1 .  BEVESPI AEROSPHERE 9-4.8 MCG/ACT AERO, Take 1 puff by mouth daily., Disp: , Rfl: 5 .  Cholecalciferol (VITAMIN D PO), Take 1 capsule by mouth daily., Disp: , Rfl:  .  diclofenac  sodium (VOLTAREN) 1 % GEL, Apply 4 g topically 2 (two) times daily as needed., Disp: 2 Tube, Rfl: 1 .  DULoxetine (CYMBALTA) 30 MG capsule, Take 3 capsules (90 mg total) by mouth daily., Disp: 270 capsule, Rfl: 0 .  gabapentin (NEURONTIN) 300 MG capsule, Take 1 capsule (300 mg total) by mouth 3 (three) times daily., Disp: 270 capsule, Rfl: 1 .  HYDROcodone-acetaminophen (NORCO/VICODIN) 5-325 MG tablet, , Disp: , Rfl: 0 .  levothyroxine (SYNTHROID, LEVOTHROID) 88 MCG tablet, Take 1 tablet (88 mcg total) by mouth daily before breakfast., Disp: 90 tablet, Rfl: 1 .  Magnesium Oxide 400 MG CAPS, Take 1 capsule (400 mg total) by mouth once., Disp: 30 capsule, Rfl: 5 .  montelukast (SINGULAIR) 10 MG tablet, TAKE 1 TABLET(10 MG) BY MOUTH DAILY, Disp: 90 tablet, Rfl: 1 .  naproxen (NAPROSYN) 500 MG tablet, Take 1 tablet by mouth 2 (two) times  daily., Disp: , Rfl: 0 .  tiZANidine (ZANAFLEX) 4 MG tablet, Take 1 tablet by mouth every 8 (eight) hours as needed for muscle spasms., Disp: , Rfl: 0 .  ASPIRIN LOW DOSE 81 MG EC tablet, TAKE 1 TABLET BY MOUTH EVERY DAY (Patient not taking: Reported on 04/28/2017), Disp: 90 tablet, Rfl: 0 .  fluocinonide (LIDEX) 0.05 % external solution, Apply 1 application topically 2 (two) times daily., Disp: 60 mL, Rfl: 0 .  omeprazole (PRILOSEC) 40 MG capsule, Take 1 capsule (40 mg total) by mouth daily., Disp: 90 capsule, Rfl: 1 .  Semaglutide (OZEMPIC) 0.25 or 0.5 MG/DOSE SOPN, Inject 0.5 mg into the skin once a week., Disp: 2 pen, Rfl: 2  No Known Allergies   ROS  Constitutional: Negative for fever or weight change.  Respiratory: positive for intermittent cough but no  shortness of breath.   Cardiovascular: Negative for chest pain or palpitations.  Gastrointestinal: Negative for abdominal pain, no bowel changes.  Musculoskeletal: Negative for gait problem or joint swelling.  Skin: Negative for rash.  Neurological: Negative for dizziness or headache.  No other specific  complaints in a complete review of systems (except as listed in HPI above).  Objective  Vitals:   04/28/17 0859  BP: 108/64  Pulse: 98  Resp: 16  Temp: 98.6 F (37 C)  TempSrc: Oral  SpO2: 97%  Weight: 189 lb 9.6 oz (86 kg)  Height:  (1.448 m)    Body mass index is 41.03 kg/m.  Physical Exam  Constitutional: Patient appears well-developed and well-nourished. Obese No distress.  HEENT: head atraumatic, normocephalic, pupils equal and reactive to light,  neck supple, throat within normal limits Cardiovascular: Normal rate, regular rhythm and normal heart sounds.  No murmur heard. No BLE edema. Pulmonary/Chest: Effort normal and breath sounds normal. No respiratory distress. Abdominal: Soft.  There is no tenderness. Psychiatric: Patient has a normal mood and affect. behavior is normal. Judgment and thought content normal. Muscular skeletal: no pain during palpation of lumbar spine, negative straight leg raise, decrease in sensation on left lateral lower leg  PHQ2/9: Depression screen [facility] 2/9 04/28/2017 12/04/2016 08/07/2016 02/01/2016 04/30/2015  Decreased Interest 3 1 0 0 0  Down, Depressed, Hopeless 1 3 0 0 0  PHQ - 2 Score 4 4 0 0 0  Altered sleeping 2 3 - - -  Tired, decreased energy 3 2 - - -  Change in appetite 3 0 - - -  Feeling bad or failure about yourself  3 0 - - -  Trouble concentrating 3 1 - - -  Moving slowly or fidgety/restless 3 2 - - -  Suicidal thoughts 0 0 - - -  PHQ-9 Score 21 12 - - -  Difficult doing work/chores Somewhat difficult Very difficult - - -     Fall Risk: Fall Risk  04/28/2017 12/04/2016 08/07/2016 02/01/2016 06/26/2015  Falls in the past year? No Yes Yes No No  Number falls in past yr: - 1 1 - -  Injury with Fall? - Yes Yes - -  Comment - Fracture her left knee - - -  Follow up - - Falls evaluation completed - -     Functional Status Survey: Is the patient deaf or have difficulty hearing?: No Does the patient have difficulty seeing,  even when wearing glasses/contacts?: No Does the patient have difficulty concentrating, remembering, or making decisions?: No Does the patient have difficulty walking or climbing stairs?: No Does the patient have difficulty  dressing or bathing?: No Does the patient have difficulty doing errands alone such as visiting a doctor's office or shopping?: No    Assessment & Plan  1. Diabetes mellitus type 2 in obese (HCC)  Unable to tolerate Trulicity we will try Ozempic - Semaglutide (OZEMPIC) 0.25 or 0.5 MG/DOSE SOPN; Inject 0.5 mg into the skin once a week.  Dispense: 2 pen; Refill: 2  2. Needs flu shot  - Flu Vaccine QUAD 6+ mos PF IM (Fluarix Quad PF)  3. Moderate episode of recurrent major depressive disorder (HCC)  - DULoxetine (CYMBALTA) 30 MG capsule; Take 3 capsules (90 mg total) by mouth daily.  Dispense: 270 capsule; Refill: 0  4. Other specified hypothyroidism  TSH reviewed and at goal   5. Dyslipidemia  Continue medication   6. Other allergic rhinitis  - montelukast (SINGULAIR) 10 MG tablet; TAKE 1 TABLET(10 MG) BY MOUTH DAILY  Dispense: 90 tablet; Refill: 1  7. Need for Tdap vaccination  Out of stock   8. Need for pneumococcal vaccine  - Pneumococcal conjugate vaccine 13-valent IM  9. Skin lesion of scalp  - fluocinonide (LIDEX) 0.05 % external solution; Apply 1 application topically 2 (two) times daily.  Dispense: 60 mL; Refill: 0  10. Screen for colon cancer  - Cologuard  11. GERD without esophagitis  - omeprazole (PRILOSEC) 40 MG capsule; Take 1 capsule (40 mg total) by mouth daily.  Dispense: 90 capsule; Refill: 1  12. Drug-induced constipation  - polyethylene glycol powder (GLYCOLAX/MIRALAX) powder; Take 17 g by mouth 2 (two) times daily as needed.  Dispense: 3350 g; Refill: 1

## 2017-04-29 ENCOUNTER — Other Ambulatory Visit: Payer: Self-pay

## 2017-05-03 ENCOUNTER — Other Ambulatory Visit: Payer: Self-pay | Admitting: Family Medicine

## 2017-05-18 ENCOUNTER — Ambulatory Visit (INDEPENDENT_AMBULATORY_CARE_PROVIDER_SITE_OTHER): Payer: BLUE CROSS/BLUE SHIELD | Admitting: Psychiatry

## 2017-05-18 ENCOUNTER — Encounter: Payer: Self-pay | Admitting: Psychiatry

## 2017-05-18 VITALS — BP 113/75 | HR 106 | Temp 98.1°F | Wt 188.6 lb

## 2017-05-18 DIAGNOSIS — F331 Major depressive disorder, recurrent, moderate: Secondary | ICD-10-CM | POA: Diagnosis not present

## 2017-05-18 DIAGNOSIS — F5105 Insomnia due to other mental disorder: Secondary | ICD-10-CM

## 2017-05-18 MED ORDER — TRAZODONE HCL 50 MG PO TABS: 25.0000 mg | ORAL_TABLET | Freq: Every day | ORAL | 1 refills | Status: DC

## 2017-05-18 MED ORDER — BUPROPION HCL 75 MG PO TABS: 75.0000 mg | ORAL_TABLET | Freq: Every day | ORAL | 1 refills | Status: DC

## 2017-05-18 MED ORDER — DULOXETINE HCL 30 MG PO CPEP: 60.0000 mg | ORAL_CAPSULE | Freq: Every day | ORAL | 0 refills | Status: DC

## 2017-05-18 NOTE — Patient Instructions (Signed)
Bupropion tablets (Depression/Mood Disorders) What is this medicine? BUPROPION (byoo PROE pee on) is used to treat depression. This medicine may be used for other purposes; ask your health care provider or pharmacist if you have questions. COMMON BRAND NAME(S): Wellbutrin What should I tell my health care provider before I take this medicine? They need to know if you have any of these conditions: -an eating disorder, such as anorexia or bulimia -bipolar disorder or psychosis -diabetes or high blood sugar, treated with medication -glaucoma -heart disease, previous heart attack, or irregular heart beat -head injury or brain tumor -high blood pressure -kidney or liver disease -seizures -suicidal thoughts or a previous suicide attempt -Tourette's syndrome -weight loss -an unusual or allergic reaction to bupropion, other medicines, foods, dyes, or preservatives -breast-feeding -pregnant or trying to become pregnant How should I use this medicine? Take this medicine by mouth with a glass of water. Follow the directions on the prescription label. You can take it with or without food. If it upsets your stomach, take it with food. Take your medicine at regular intervals. Do not take your medicine more often than directed. Do not stop taking this medicine suddenly except upon the advice of your doctor. Stopping this medicine too quickly may cause serious side effects or your condition may worsen. A special MedGuide will be given to you by the pharmacist with each prescription and refill. Be sure to read this information carefully each time. Talk to your pediatrician regarding the use of this medicine in children. Special care may be needed. Overdosage: If you think you have taken too much of this medicine contact a poison control center or emergency room at once. NOTE: This medicine is only for you. Do not share this medicine with others. What if I miss a dose? If you miss a dose, take it as soon  as you can. If it is less than four hours to your next dose, take only that dose and skip the missed dose. Do not take double or extra doses. What may interact with this medicine? Do not take this medicine with any of the following medications: -linezolid -MAOIs like Azilect, Carbex, Eldepryl, Marplan, Nardil, and Parnate -methylene blue (injected into a vein) -other medicines that contain bupropion like Zyban This medicine may also interact with the following medications: -alcohol -certain medicines for anxiety or sleep -certain medicines for blood pressure like metoprolol, propranolol -certain medicines for depression or psychotic disturbances -certain medicines for HIV or AIDS like efavirenz, lopinavir, nelfinavir, ritonavir -certain medicines for irregular heart beat like propafenone, flecainide -certain medicines for Parkinson's disease like amantadine, levodopa -certain medicines for seizures like carbamazepine, phenytoin, phenobarbital -cimetidine -clopidogrel -cyclophosphamide -digoxin -furazolidone -isoniazid -nicotine -orphenadrine -procarbazine -steroid medicines like prednisone or cortisone -stimulant medicines for attention disorders, weight loss, or to stay awake -tamoxifen -theophylline -thiotepa -ticlopidine -tramadol -warfarin This list may not describe all possible interactions. Give your health care provider a list of all the medicines, herbs, non-prescription drugs, or dietary supplements you use. Also tell them if you smoke, drink alcohol, or use illegal drugs. Some items may interact with your medicine. What should I watch for while using this medicine? Tell your doctor if your symptoms do not get better or if they get worse. Visit your doctor or health care professional for regular checks on your progress. Because it may take several weeks to see the full effects of this medicine, it is important to continue your treatment as prescribed by your  doctor. Patients and their families   should watch out for new or worsening thoughts of suicide or depression. Also watch out for sudden changes in feelings such as feeling anxious, agitated, panicky, irritable, hostile, aggressive, impulsive, severely restless, overly excited and hyperactive, or not being able to sleep. If this happens, especially at the beginning of treatment or after a change in dose, call your health care professional. Avoid alcoholic drinks while taking this medicine. Drinking excessive alcoholic beverages, using sleeping or anxiety medicines, or quickly stopping the use of these agents while taking this medicine may increase your risk for a seizure. Do not drive or use heavy machinery until you know how this medicine affects you. This medicine can impair your ability to perform these tasks. Do not take this medicine close to bedtime. It may prevent you from sleeping. Your mouth may get dry. Chewing sugarless gum or sucking hard candy, and drinking plenty of water may help. Contact your doctor if the problem does not go away or is severe. What side effects may I notice from receiving this medicine? Side effects that you should report to your doctor or health care professional as soon as possible: -allergic reactions like skin rash, itching or hives, swelling of the face, lips, or tongue -breathing problems -changes in vision -confusion -elevated mood, decreased need for sleep, racing thoughts, impulsive behavior -fast or irregular heartbeat -hallucinations, loss of contact with reality -increased blood pressure -redness, blistering, peeling or loosening of the skin, including inside the mouth -seizures -suicidal thoughts or other mood changes -unusually weak or tired -vomiting Side effects that usually do not require medical attention (report to your doctor or health care professional if they continue or are bothersome): -constipation -headache -loss of  appetite -nausea -tremors -weight loss This list may not describe all possible side effects. Call your doctor for medical advice about side effects. You may report side effects to FDA at 1-800-FDA-1088. Where should I keep my medicine? Keep out of the reach of children. Store at room temperature between 20 and 25 degrees C (68 and 77 degrees F), away from direct sunlight and moisture. Keep tightly closed. Throw away any unused medicine after the expiration date. NOTE: This sheet is a summary. It may not cover all possible information. If you have questions about this medicine, talk to your doctor, pharmacist, or health care provider.  2018 Elsevier/Gold Standard (2016-01-04 13:44:21) Trazodone tablets What is this medicine? TRAZODONE (TRAZ oh done) is used to treat depression. This medicine may be used for other purposes; ask your health care provider or pharmacist if you have questions. COMMON BRAND NAME(S): Desyrel What should I tell my health care provider before I take this medicine? They need to know if you have any of these conditions: -attempted suicide or thinking about it -bipolar disorder -bleeding problems -glaucoma -heart disease, or previous heart attack -irregular heart beat -kidney or liver disease -low levels of sodium in the blood -an unusual or allergic reaction to trazodone, other medicines, foods, dyes or preservatives -pregnant or trying to get pregnant -breast-feeding How should I use this medicine? Take this medicine by mouth with a glass of water. Follow the directions on the prescription label. Take this medicine shortly after a meal or a light snack. Take your medicine at regular intervals. Do not take your medicine more often than directed. Do not stop taking this medicine suddenly except upon the advice of your doctor. Stopping this medicine too quickly may cause serious side effects or your condition may worsen. A special MedGuide will be   given to you by  the pharmacist with each prescription and refill. Be sure to read this information carefully each time. Talk to your pediatrician regarding the use of this medicine in children. Special care may be needed. Overdosage: If you think you have taken too much of this medicine contact a poison control center or emergency room at once. NOTE: This medicine is only for you. Do not share this medicine with others. What if I miss a dose? If you miss a dose, take it as soon as you can. If it is almost time for your next dose, take only that dose. Do not take double or extra doses. What may interact with this medicine? Do not take this medicine with any of the following medications: -certain medicines for fungal infections like fluconazole, itraconazole, ketoconazole, posaconazole, voriconazole -cisapride -dofetilide -dronedarone -linezolid -MAOIs like Carbex, Eldepryl, Marplan, Nardil, and Parnate -mesoridazine -methylene blue (injected into a vein) -pimozide -saquinavir -thioridazine -ziprasidone This medicine may also interact with the following medications: -alcohol -antiviral medicines for HIV or AIDS -aspirin and aspirin-like medicines -barbiturates like phenobarbital -certain medicines for blood pressure, heart disease, irregular heart beat -certain medicines for depression, anxiety, or psychotic disturbances -certain medicines for migraine headache like almotriptan, eletriptan, frovatriptan, naratriptan, rizatriptan, sumatriptan, zolmitriptan -certain medicines for seizures like carbamazepine and phenytoin -certain medicines for sleep -certain medicines that treat or prevent blood clots like dalteparin, enoxaparin, warfarin -digoxin -fentanyl -lithium -NSAIDS, medicines for pain and inflammation, like ibuprofen or naproxen -other medicines that prolong the QT interval (cause an abnormal heart rhythm) -rasagiline -supplements like St. John's wort, kava kava,  valerian -tramadol -tryptophan This list may not describe all possible interactions. Give your health care provider a list of all the medicines, herbs, non-prescription drugs, or dietary supplements you use. Also tell them if you smoke, drink alcohol, or use illegal drugs. Some items may interact with your medicine. What should I watch for while using this medicine? Tell your doctor if your symptoms do not get better or if they get worse. Visit your doctor or health care professional for regular checks on your progress. Because it may take several weeks to see the full effects of this medicine, it is important to continue your treatment as prescribed by your doctor. Patients and their families should watch out for new or worsening thoughts of suicide or depression. Also watch out for sudden changes in feelings such as feeling anxious, agitated, panicky, irritable, hostile, aggressive, impulsive, severely restless, overly excited and hyperactive, or not being able to sleep. If this happens, especially at the beginning of treatment or after a change in dose, call your health care professional. You may get drowsy or dizzy. Do not drive, use machinery, or do anything that needs mental alertness until you know how this medicine affects you. Do not stand or sit up quickly, especially if you are an older patient. This reduces the risk of dizzy or fainting spells. Alcohol may interfere with the effect of this medicine. Avoid alcoholic drinks. This medicine may cause dry eyes and blurred vision. If you wear contact lenses you may feel some discomfort. Lubricating drops may help. See your eye doctor if the problem does not go away or is severe. Your mouth may get dry. Chewing sugarless gum, sucking hard candy and drinking plenty of water may help. Contact your doctor if the problem does not go away or is severe. What side effects may I notice from receiving this medicine? Side effects that you should report to your    doctor or health care professional as soon as possible: -allergic reactions like skin rash, itching or hives, swelling of the face, lips, or tongue -elevated mood, decreased need for sleep, racing thoughts, impulsive behavior -confusion -fast, irregular heartbeat -feeling faint or lightheaded, falls -feeling agitated, angry, or irritable -loss of balance or coordination -painful or prolonged erections -restlessness, pacing, inability to keep still -suicidal thoughts or other mood changes -tremors -trouble sleeping -seizures -unusual bleeding or bruising Side effects that usually do not require medical attention (report to your doctor or health care professional if they continue or are bothersome): -change in sex drive or performance -change in appetite or weight -constipation -headache -muscle aches or pains -nausea This list may not describe all possible side effects. Call your doctor for medical advice about side effects. You may report side effects to FDA at 1-800-FDA-1088. Where should I keep my medicine? Keep out of the reach of children. Store at room temperature between 15 and 30 degrees C (59 to 86 degrees F). Protect from light. Keep container tightly closed. Throw away any unused medicine after the expiration date. NOTE: This sheet is a summary. It may not cover all possible information. If you have questions about this medicine, talk to your doctor, pharmacist, or health care provider.  2018 Elsevier/Gold Standard (2015-12-13 16:57:05)  

## 2017-05-18 NOTE — Progress Notes (Signed)
Psychiatric Initial Adult Assessment   Patient Identification: Sara Andrews MRN:  [id number] Date of Evaluation:  05/18/2017 Referral Source: Dr.Krichna Sowles Chief Complaint:  " I am depressed."  Chief Complaint    Establish Care; Depression     Visit Diagnosis:    ICD-10-CM   1. Moderate episode of recurrent major depressive disorder (HCC) F33.1 DULoxetine (CYMBALTA) 30 MG capsule    buPROPion (WELLBUTRIN) 75 MG tablet    traZODone (DESYREL) 50 MG tablet  2. Insomnia due to mental condition F51.05     History of Present Illness:  Sara Andrews is a 50 y old CF who is unemployed on SSI, lives in [place] , widowed , has a hx of MDD, multiple medical issues including DDD, chronic pain, DM, GERD , COPD . Sara Andrews presented to [facility] today to establish care.  Sara Andrews today presented as tearful. Sara Andrews reported that Sara Andrews has been struggling with depression ever since her husband passed away in 12/07/06. He had cancer and they were together for 35 yrs. Sara Andrews was started on antidepressants like zoloft ,wellbutrin and is currently on cymbalta.Her Cymbalta dosage was increased to 90 mg last February. Sara Andrews however continues to have tearfulness daily, lack of motivation, sadness all day as well as sleep issues. Her sx have been worsening since the past 6 months to a year .Sara Andrews reported that Sara Andrews does not have any SI. Sara Andrews is religious , does not go to church since Sara Andrews had her knee surgery since Sara Andrews cannot sit for too long.   Sara Andrews continues to spend time with her daughters, grand kids as well as friends.  Sara Andrews denies any anxiety issues.  Sara Andrews denies psychosis, hx of mania or trauma.  Sara Andrews does have relational issues with her youngest daughter. Sara Andrews does not know what is going on , her daughter does not want to talk to her or her other daughters. Sara Andrews lives in [place], but the last time Sara Andrews saw her was last year around the holidays. Sara Andrews believes her daughter is struggling with her own mental health  issues.  Sara Andrews has several medical issues as mentioned above , most concerning are her pain issues. Sara Andrews continues to have back pain issues , her provider is managing it .    Associated Signs/Symptoms: Depression Symptoms:  depressed mood, anhedonia, insomnia, fatigue, (Hypo) Manic Symptoms:  denies Anxiety Symptoms:  denies Psychotic Symptoms:  denies PTSD Symptoms: Negative  Past Psychiatric History: Hx of MDD after the death of her husband in 12/07/06. Denies hx of suicide attempts or ideations. Denies IP admissions. Her PMD was managing her depressive sx.  Previous Psychotropic Medications:zoloft( did not work) , wellbutrin( worked)  Substance Abuse History in the last 12 months:  No.  Consequences of Substance Abuse: Negative  Past Medical History:  Past Medical History:  Diagnosis Date  . Anxiety   . Arthritis    "hips, hands, back" (03/10/2016)  . Asthma   . Chronic bronchitis (HCC)   . COPD (chronic obstructive pulmonary disease) (HCC)   . Cough   . Depression   . Esophagitis, reflux   . GERD (gastroesophageal reflux disease)   . Hyperlipidemia   . Hypothyroidism   . IBS (irritable bowel syndrome)   . Lumbago   . Muscle pain   . Osteoarthritis   . Sinus disorder   . Thyroid disease   . Type 2 diabetes, diet controlled (HCC)   . Uncomplicated herpes simplex     Past Surgical History:  Procedure Laterality Date  .  ABDOMINAL HYSTERECTOMY    . CARPAL TUNNEL RELEASE Right   . ORIF TIBIA PLATEAU Left 03/10/2016   Procedure: OPEN REDUCTION INTERNAL FIXATION (ORIF) BICONDYLAR TIBIAL PLATEAU FRACTURE;  Surgeon: Eldred Manges, MD;  Location: [facility] OR;  Service: Orthopedics;  Laterality: Left;  . SHOULDER ARTHROSCOPY W/ ROTATOR CUFF REPAIR Right   . TONSILLECTOMY AND ADENOIDECTOMY      Family Psychiatric History: Daughter - mental health issues, sister - suicide ideations, drug abuse .  Family History:  Family History  Problem Relation Age of Onset  . Hypertension  Daughter   . Diabetes Father   . Heart disease Father   . Breast cancer Maternal Aunt 60  . Breast cancer Paternal Aunt 72  . Depression Sister   . Alcohol abuse Sister   . Anxiety disorder Sister   . Bipolar disorder Sister     Social History:   Social History   Social History  . Marital status: Widowed    Spouse name: N/A  . Number of children: N/A  . Years of education: N/A   Social History Main Topics  . Smoking status: Former Smoker    Packs/day: 2.00    Years: 30.00    Quit date: 09/03/2008  . Smokeless tobacco: Never Used  . Alcohol use No  . Drug use: No  . Sexual activity: No   Other Topics Concern  . None   Social History Narrative   Sara Andrews used to work at  [employer], but had a left knee work related injury 03/10/2016 , require left knee surgery and is now having back pain, that is getting evaluated, Out of work since.     Additional Social History: Raised by her father, her mother left her when Sara Andrews was 63 y old . Sara Andrews is currently widowed after 35 yrs of marriage. Has 3 daughters , 2 of them are very supportive , youngest has her own issues Sara Andrews is dealing with , does not come around as much. Sara Andrews has a lot of grand kids and Sara Andrews enjoys taking care of them. Sara Andrews support self with SSI as well as workers comp.   Allergies:  No Known Allergies  Metabolic Disorder Labs: Lab Results  Component Value Date   HGBA1C 6.0 01/21/2017   No results found for: PROLACTIN Lab Results  Component Value Date   CHOL 159 12/28/2015   TRIG 96 01/21/2017   HDL 48 01/21/2017   CHOLHDL 2.7 12/28/2015   LDLCALC 56 01/21/2017   LDLCALC 87 12/28/2015     Current Medications: Current Outpatient Prescriptions  Medication Sig Dispense Refill  . acyclovir (ZOVIRAX) 400 MG tablet TAKE 1 TABLET(400 MG) BY MOUTH DAILY 30 tablet 0  . albuterol (PROVENTIL) (2.5 MG/3ML) 0.083% nebulizer solution Take 3 mLs (2.5 mg total) by nebulization every 6 (six) hours as needed for wheezing or  shortness of breath. 75 mL 12  . atorvastatin (LIPITOR) 40 MG tablet Take 1 tablet (40 mg total) by mouth daily. In place of simvastatin 90 tablet 1  . BEVESPI AEROSPHERE 9-4.8 MCG/ACT AERO Take 1 puff by mouth daily.  5  . Cholecalciferol (VITAMIN D PO) Take 1 capsule by mouth daily.    . diclofenac sodium (VOLTAREN) 1 % GEL Apply 4 g topically 2 (two) times daily as needed. 2 Tube 1  . DULoxetine (CYMBALTA) 30 MG capsule Take 2 capsules (60 mg total) by mouth daily. 60 capsule 0  . gabapentin (NEURONTIN) 300 MG capsule Take 1 capsule (300 mg total) by mouth  3 (three) times daily. 270 capsule 1  . levothyroxine (SYNTHROID, LEVOTHROID) 88 MCG tablet Take 1 tablet (88 mcg total) by mouth daily before breakfast. 90 tablet 1  . Magnesium Oxide 400 MG CAPS Take 1 capsule (400 mg total) by mouth once. 30 capsule 5  . montelukast (SINGULAIR) 10 MG tablet TAKE 1 TABLET(10 MG) BY MOUTH DAILY 90 tablet 1  . naproxen (NAPROSYN) 500 MG tablet Take 1 tablet by mouth 2 (two) times daily.  0  . omeprazole (PRILOSEC) 40 MG capsule Take 1 capsule (40 mg total) by mouth daily. 90 capsule 1  . polyethylene glycol powder (GLYCOLAX/MIRALAX) powder Take 17 g by mouth 2 (two) times daily as needed. 3350 g 1  . Semaglutide (OZEMPIC) 0.25 or 0.5 MG/DOSE SOPN Inject 0.5 mg into the skin once a week. 2 pen 2  . tiotropium (SPIRIVA HANDIHALER) 18 MCG inhalation capsule Place into inhaler and inhale.    Marland Kitchen tiZANidine (ZANAFLEX) 4 MG tablet Take 1 tablet by mouth every 8 (eight) hours as needed for muscle spasms.  0  . aspirin 81 MG EC tablet TK 1 T PO QD  0  . buPROPion (WELLBUTRIN) 75 MG tablet Take 1 tablet (75 mg total) by mouth daily with breakfast. 30 tablet 1  . fluocinonide (LIDEX) 0.05 % external solution APP EXT AA BID  0  . traZODone (DESYREL) 50 MG tablet Take 0.5-1 tablets (25-50 mg total) by mouth at bedtime. 30 tablet 1   No current facility-administered medications for this visit.      Neurologic: Headache: No Seizure: No Paresthesias:No  Musculoskeletal: Strength & Muscle Tone: within normal limits Gait & Station: normal Patient leans: N/A  Psychiatric Specialty Exam: Review of Systems  Musculoskeletal: Positive for back pain.  Psychiatric/Behavioral: Positive for depression. The patient has insomnia.   All other systems reviewed and are negative.   Blood pressure 113/75, pulse (!) 106, temperature 98.1 F (36.7 C), temperature source Oral, weight 188 lb 9.6 oz (85.5 kg).Body mass index is 40.81 kg/m.  General Appearance: Casual  Eye Contact:  Fair  Speech:  Normal Rate  Volume:  Normal  Mood:  Depressed  Affect:  Tearful  Thought Process:  Goal Directed and Descriptions of Associations: Intact  Orientation:  Full (Time, Place, and Person)  Thought Content:  Rumination  Suicidal Thoughts:  No  Homicidal Thoughts:  No  Memory:  Immediate;   Fair Recent;   Fair Remote;   Fair  Judgement:  Fair  Insight:  Fair  Psychomotor Activity:  Normal  Concentration:  Concentration: Fair and Attention Span: Fair  Recall:  [employer] of Knowledge:Fair  Language: Fair  Akathisia:  No  Handed:  Right  AIMS (if indicated):  NA  Assets:  Communication Skills Desire for Improvement Social Support  ADL's:  Intact  Cognition: WNL  Sleep:  impaired    Treatment Plan Summary:Sara Andrews is a 28 y old CF who is widowed , has a hx of mental illness as well as medical issues , chronic pain, presented to [facility] since her medications have not been that effective. Sara Andrews has relational issues with her daughter, continues to have some coping issues with the death of her husband. Sara Andrews has a family hx of mental health issues , however has good social support , denies SI, denies substance abuse. Her medical issues could also be contributing to her mental health issues.Eventhough Sara Andrews denies trauma, her mother leaving her when Sara Andrews was very little does contribute to some of  her mental  health issues . Sara Andrews hence will benefit from psychotherapy along with medication management.  Will see plan as noted below.  Medication management and Plan see below  For depression Reduce Cymbalta to 60 mg po daily for affective sx. Start Wellbutrin 75 mg po daily to augment her cymbalta. PHQ -9 - 5 ( however Sara Andrews was noted as tearful the entire evaluation)   For insomnia: Start Trazodone 25 to 50 mg po qhs . Discussed sleep hygiene - bedtime and wake up time, cutting down caffeine , switching off TV , computers to limit blue light prior to sleep .  Provided handouts for medications - discussed side effects.  Will refer for CBT with Ms.Peacock.  TSH reviewed in EHR - 01/21/2017 - WNL .Continue her thyroid medications- follow up with PMD.  Discussed coping strategies, leisure activities.   Jomarie LongsSaramma Remedy Corporan, MD 10/22/201810:26 AM

## 2017-06-02 ENCOUNTER — Ambulatory Visit (HOSPITAL_COMMUNITY): Payer: Self-pay | Admitting: Psychiatry

## 2017-06-03 ENCOUNTER — Ambulatory Visit: Payer: BLUE CROSS/BLUE SHIELD | Admitting: Licensed Clinical Social Worker

## 2017-06-17 ENCOUNTER — Other Ambulatory Visit: Payer: Self-pay

## 2017-06-17 ENCOUNTER — Encounter: Payer: Self-pay | Admitting: Psychiatry

## 2017-06-17 ENCOUNTER — Ambulatory Visit: Payer: BLUE CROSS/BLUE SHIELD | Admitting: Psychiatry

## 2017-06-17 VITALS — BP 104/64 | HR 106 | Temp 98.2°F | Wt 187.0 lb

## 2017-06-17 DIAGNOSIS — F331 Major depressive disorder, recurrent, moderate: Secondary | ICD-10-CM | POA: Diagnosis not present

## 2017-06-17 DIAGNOSIS — F5105 Insomnia due to other mental disorder: Secondary | ICD-10-CM | POA: Diagnosis not present

## 2017-06-17 MED ORDER — TRAZODONE HCL 50 MG PO TABS: 25.0000 mg | ORAL_TABLET | Freq: Every day | ORAL | 1 refills | Status: DC

## 2017-06-17 MED ORDER — DULOXETINE HCL 30 MG PO CPEP: 60.0000 mg | ORAL_CAPSULE | Freq: Every day | ORAL | 1 refills | Status: DC

## 2017-06-17 MED ORDER — BUPROPION HCL ER (XL) 150 MG PO TB24
150.0000 mg | ORAL_TABLET | Freq: Every day | ORAL | 1 refills | Status: DC
Start: 2017-06-17 — End: 2017-07-16

## 2017-06-17 NOTE — Progress Notes (Signed)
[facility] MD OP Progress Note  06/17/2017 10:56 AM Sara Andrews  MRN:  [id number]  Chief Complaint: ' I am ok."  Chief Complaint    Follow-up; Medication Refill     HPI: Sara Andrews is a 61 year old Caucasian female who is on SSI, lives in [place], widowed, has a history of MDD, multiple medical issues including DDD, chronic pain, diabetes mellitus, GERD, COPD, presented today for a follow-up.  Patient today reports that her new medications are helping her a little bit.  She has been sleeping  better.  She reports that she however feels like she continues to struggle with depressive symptoms and increased crying spells on a regular basis.  She reports she  struggles with relationship issues with her nephew as well as her daughter and her granddaughter.  Patient reports that she gets together with her family during the holidays.  The last time she was with her nephew was during Halloween.  She reported that her nephew "Casimiro Needle", would talk in hurtful ways to her.  She reports that this makes her tearful whenever she thinks about it.  She reports she does not know why he does that.  She also became very tearful when she discussed that her granddaughter, her youngest daughter's child did not invite her for her marriage.  She wonders what she did wrong and feels hurt.  She denies any suicidal ideations at this time.  She reports that she is willing to follow up with Ms. Peacock for therapy.  But her therapy appointment got canceled last time and it has been rescheduled to next week.  She denies any other issues or concerns.  She remains compliant on her medications.  She denies side effects.  Provided supportive psychotherapy.  Provided handouts and discussed relaxation techniques.   Visit Diagnosis:    ICD-10-CM   1. Moderate episode of recurrent major depressive disorder (HCC) F33.1 traZODone (DESYREL) 50 MG tablet    buPROPion (WELLBUTRIN XL) 150 MG 24 hr tablet    DULoxetine (CYMBALTA) 30 MG  capsule  2. Insomnia due to mental disorder F51.05     Past Psychiatric History: MDD after the death of her husband in 2006-12-13.  Denies history of suicide attempts or suicidal ideations.  Denies IP admissions.  Her PMD was managing her depressive symptoms in the past.  Past trials of Zoloft( did not work) , Wellbutrin ( worked).  Past Medical History:  Past Medical History:  Diagnosis Date  . Anxiety   . Arthritis    "hips, hands, back" (03/10/2016)  . Asthma   . Chronic bronchitis (HCC)   . COPD (chronic obstructive pulmonary disease) (HCC)   . Cough   . Depression   . Esophagitis, reflux   . GERD (gastroesophageal reflux disease)   . Hyperlipidemia   . Hypothyroidism   . IBS (irritable bowel syndrome)   . Lumbago   . Muscle pain   . Osteoarthritis   . Sinus disorder   . Thyroid disease   . Type 2 diabetes, diet controlled (HCC)   . Uncomplicated herpes simplex     Past Surgical History:  Procedure Laterality Date  . ABDOMINAL HYSTERECTOMY    . CARPAL TUNNEL RELEASE Right   . ORIF TIBIA PLATEAU Left 03/10/2016   Procedure: OPEN REDUCTION INTERNAL FIXATION (ORIF) BICONDYLAR TIBIAL PLATEAU FRACTURE;  Surgeon: Eldred Manges, MD;  Location: [facility] OR;  Service: Orthopedics;  Laterality: Left;  . SHOULDER ARTHROSCOPY W/ ROTATOR CUFF REPAIR Right   . TONSILLECTOMY AND ADENOIDECTOMY  Family Psychiatric History: Daughter-mental health issues, sister-suicide ideations, drug abuse.  Family History:  Family History  Problem Relation Age of Onset  . Hypertension Daughter   . Diabetes Father   . Heart disease Father   . Breast cancer Maternal Aunt 60  . Breast cancer Paternal Aunt 6560  . Depression Sister   . Alcohol abuse Sister   . Anxiety disorder Sister   . Bipolar disorder Sister     Social History: Raised by her father, her mother left her when she was 61 years old.  She is currently widowed after 35 years of marriage.  Has 3 daughters, 2 of them are very supportive, and  youngest has her own issues and patient has a rocky relationship with her.  She has a lot of grandkids and she enjoys taking care of them.  She supports herself with SSI as well as Worker's Comp. Social History   Socioeconomic History  . Marital status: Widowed    Spouse name: None  . Number of children: 3  . Years of education: None  . Highest education level: 10th grade  Social Needs  . Financial resource strain: Not hard at all  . Food insecurity - worry: Never true  . Food insecurity - inability: Never true  . Transportation needs - medical: No  . Transportation needs - non-medical: No  Occupational History    Comment: retired  Tobacco Use  . Smoking status: Former Smoker    Packs/day: 2.00    Years: 30.00    Pack years: 60.00    Last attempt to quit: 09/03/2008    Years since quitting: 8.7  . Smokeless tobacco: Never Used  Substance and Sexual Activity  . Alcohol use: No  . Drug use: No  . Sexual activity: No  Other Topics Concern  . None  Social History Narrative   She used to work at  [employer], but had a left knee work related injury 03/10/2016 , require left knee surgery and is now having back pain, that is getting evaluated, Out of work since.     Allergies: No Known Allergies  Metabolic Disorder Labs: Lab Results  Component Value Date   HGBA1C 6.0 01/21/2017   No results found for: PROLACTIN Lab Results  Component Value Date   CHOL 159 12/28/2015   TRIG 96 01/21/2017   HDL 48 01/21/2017   CHOLHDL 2.7 12/28/2015   LDLCALC 56 01/21/2017   LDLCALC 87 12/28/2015   Lab Results  Component Value Date   TSH 0.95 01/21/2017   TSH 4.21 08/07/2016    Therapeutic Level Labs: No results found for: LITHIUM No results found for: VALPROATE No components found for:  CBMZ  Current Medications: Current Outpatient Medications  Medication Sig Dispense Refill  . acyclovir (ZOVIRAX) 400 MG tablet TAKE 1 TABLET(400 MG) BY MOUTH DAILY 30 tablet 0  . albuterol  (PROVENTIL) (2.5 MG/3ML) 0.083% nebulizer solution Take 3 mLs (2.5 mg total) by nebulization every 6 (six) hours as needed for wheezing or shortness of breath. 75 mL 12  . aspirin 81 MG EC tablet TK 1 T PO QD  0  . atorvastatin (LIPITOR) 40 MG tablet Take 1 tablet (40 mg total) by mouth daily. In place of simvastatin 90 tablet 1  . BEVESPI AEROSPHERE 9-4.8 MCG/ACT AERO Take 1 puff by mouth daily.  5  . Cholecalciferol (VITAMIN D PO) Take 1 capsule by mouth daily.    . diclofenac sodium (VOLTAREN) 1 % GEL Apply 4 g topically 2 (  two) times daily as needed. 2 Tube 1  . DULoxetine (CYMBALTA) 30 MG capsule Take 2 capsules (60 mg total) by mouth daily. 60 capsule 1  . fluocinonide (LIDEX) 0.05 % external solution APP EXT AA BID  0  . gabapentin (NEURONTIN) 300 MG capsule Take 1 capsule (300 mg total) by mouth 3 (three) times daily. 270 capsule 1  . levothyroxine (SYNTHROID, LEVOTHROID) 88 MCG tablet Take 1 tablet (88 mcg total) by mouth daily before breakfast. 90 tablet 1  . Magnesium Oxide 400 MG CAPS Take 1 capsule (400 mg total) by mouth once. 30 capsule 5  . montelukast (SINGULAIR) 10 MG tablet TAKE 1 TABLET(10 MG) BY MOUTH DAILY 90 tablet 1  . omeprazole (PRILOSEC) 40 MG capsule Take 1 capsule (40 mg total) by mouth daily. 90 capsule 1  . polyethylene glycol powder (GLYCOLAX/MIRALAX) powder Take 17 g by mouth 2 (two) times daily as needed. 3350 g 1  . Semaglutide (OZEMPIC) 0.25 or 0.5 MG/DOSE SOPN Inject 0.5 mg into the skin once a week. 2 pen 2  . tiotropium (SPIRIVA HANDIHALER) 18 MCG inhalation capsule Place into inhaler and inhale.    Marland Kitchen. tiZANidine (ZANAFLEX) 4 MG tablet Take 1 tablet by mouth every 8 (eight) hours as needed for muscle spasms.  0  . traZODone (DESYREL) 50 MG tablet Take 0.5-1 tablets (25-50 mg total) by mouth at bedtime. 30 tablet 1  . buPROPion (WELLBUTRIN XL) 150 MG 24 hr tablet Take 1 tablet (150 mg total) by mouth daily. 30 tablet 1   No current facility-administered  medications for this visit.      Musculoskeletal: Strength & Muscle Tone: within normal limits Gait & Station: normal Patient leans: N/A  Psychiatric Specialty Exam: Review of Systems  Psychiatric/Behavioral: Positive for depression. Negative for hallucinations, substance abuse and suicidal ideas. The patient is nervous/anxious. The patient does not have insomnia.   All other systems reviewed and are negative.   Blood pressure 104/64, pulse (!) 106, temperature 98.2 F (36.8 C), temperature source Oral, weight 187 lb (84.8 kg).Body mass index is 40.47 kg/m.  General Appearance: Casual  Eye Contact:  Fair  Speech:  Clear and Coherent  Volume:  Normal  Mood:  Anxious, Depressed and Dysphoric  Affect:  Tearful  Thought Process:  Goal Directed and Descriptions of Associations: Intact  Orientation:  Full (Time, Place, and Person)  Thought Content: Logical   Suicidal Thoughts:  No  Homicidal Thoughts:  No  Memory:  Immediate;   Fair Recent;   Fair Remote;   Fair  Judgement:  Fair  Insight:  Fair  Psychomotor Activity:  Normal  Concentration:  Concentration: Fair and Attention Span: Fair  Recall:  FiservFair  Fund of Knowledge: Fair  Language: Fair  Akathisia:  No  Handed:  Right  AIMS (if indicated):NA  Assets:  Communication Skills Desire for Improvement Financial Resources/Insurance Housing Transportation  ADL's:  Intact  Cognition: WNL  Sleep:  Fair   Screenings: PHQ2-9     [facility]  PHQ-2 Total Score  4  4  0  0  0  PHQ-9 Total Score  21  12  No data  No data  No data       Assessment and Plan: Sara Hashimotoatricia  is a 61 year old Caucasian female who is widowed, has a history  of mental illness as well as medical issues, chronic pain, who presented to the clinic today for a follow-up visit.  Sara Andrews is currently reporting some improvement with her sleep but she continues to struggle with depression as well as relational stressors.  She is scheduled to see Ms. Peacock for psychotherapy next week.  Also discussed medication management as noted below.    Plan as noted below  Depression Cymbalta 60 mg p.o. Daily Increase Wellbutrin to XL 150 mg p.o. daily to augment her Cymbalta.  Insomnia Trazodone 25-50 mg p.o. Nightly  Provided handouts about the "Poisonous Parrot" , discussed distraction techniques, relaxation techniques, being aware of maladaptive thought pattern and so on.  Continue CBT with Ms.Peacock.  Follow-up in 4 weeks.     Jomarie Longs, MD 06/17/2017, 10:56 AM

## 2017-06-25 ENCOUNTER — Ambulatory Visit: Payer: BLUE CROSS/BLUE SHIELD | Admitting: Licensed Clinical Social Worker

## 2017-06-25 DIAGNOSIS — F331 Major depressive disorder, recurrent, moderate: Secondary | ICD-10-CM | POA: Diagnosis not present

## 2017-06-25 NOTE — Progress Notes (Signed)
Comprehensive Clinical Assessment (CCA) Note  06/25/2017 Sara Andrews [id number]  Visit Diagnosis:      ICD-10-CM   1. Moderate episode of recurrent major depressive disorder (HCC) F33.1       CCA Part One  Part One has been completed on paper by the patient.  (See scanned document in Chart Review)  CCA Part Two A  Intake/Chief Complaint:  CCA Intake With Chief Complaint CCA Part Two Date: 06/25/17 CCA Part Two Time: 1004 Chief Complaint/Presenting Problem: Dr. Elna Breslow says I need to talk to someone. Patients Currently Reported Symptoms/Problems: My doctor took me off my antidepressants. I have been to several doctors due to changes such as retirement or doctor leaving.  I have crying spells, I have a hard time dealing with stuff.  My PCP referred me here due to changes with my medications. I have been calm the last few days.  My husband passed away in 12-16-2006.  We lived together for 20 years.  We were married for 15 years.  I was not on antidepressants when he was alive. I have 3 children. My children are helpful.  My Granddaughter had a baby but I was not aware of the pregnancy. SHe also was married a few days prior to Thanksgiving.  I was married prior.  He died after we were married 1.5 years. HE is the father to my twins.  He died in prison. I am currently having back problems from my accident.  I was in a wheel chair for a while. I want to get my nerve burned in my back to reduce pain. Individual's Strengths: good to others, sense of humor Individual's Preferences: "I don't know" Individual's Abilities: communicates well Type of Services Patient Feels Are Needed: therapy, medication  Mental Health Symptoms Depression:  Depression: Difficulty Concentrating, Irritability, Sleep (too much or little), Tearfulness, Increase/decrease in appetite  Mania:  Mania: N/A  Anxiety:   Anxiety: N/A  Psychosis:  Psychosis: N/A  Trauma:  Trauma: N/A  Obsessions:  Obsessions: N/A   Compulsions:  Compulsions: N/A  Inattention:  Inattention: N/A  Hyperactivity/Impulsivity:  Hyperactivity/Impulsivity: N/A  Oppositional/Defiant Behaviors:  Oppositional/Defiant Behaviors: N/A  Borderline Personality:  Emotional Irregularity: N/A  Other Mood/Personality Symptoms:      Mental Status Exam Appearance and self-care  Stature:  Stature: Average  Weight:  Weight: Overweight  Clothing:  Clothing: Casual  Grooming:  Grooming: Normal  Cosmetic use:  Cosmetic Use: None  Posture/gait:  Posture/Gait: Normal  Motor activity:  Motor Activity: Not Remarkable  Sensorium  Attention:  Attention: Normal  Concentration:  Concentration: Normal  Orientation:  Orientation: X5  Recall/memory:  Recall/Memory: Normal  Affect and Mood  Affect:     Mood:     Relating  Eye contact:  Eye Contact: Normal  Facial expression:  Facial Expression: Responsive  Attitude toward examiner:  Attitude Toward Examiner: Cooperative  Thought and Language  Speech flow: Speech Flow: Normal  Thought content:  Thought Content: Appropriate to mood and circumstances  Preoccupation:     Hallucinations:     Organization:     Company secretary of Knowledge:  Fund of Knowledge: Average  Intelligence:  Intelligence: Average  Abstraction:  Abstraction: Normal  Judgement:  Judgement: Normal  Reality Testing:  Reality Testing: Adequate  Insight:  Insight: Fair  Decision Making:  Decision Making: Normal  Social Functioning  Social Maturity:  Social Maturity: Responsible  Social Judgement:  Social Judgement: Normal  Stress  Stressors:  Stressors: Family conflict, Illness,  Transitions  Coping Ability:  Coping Ability: Overwhelmed  Skill Deficits:     Supports:      Family and Psychosocial History: Family history Marital status: Widowed Widowed, when?: [age]yrs Are you sexually active?: No What is your sexual orientation?: heterosexual Does patient have children?: Yes(Sara Andrews, Sara BonitoChristy [id number] (Twins),  Sara Andrews 40) How many children?: 3 How is patient's relationship with their children?: Has a strained relationship with her daughter Sara Andrews.  SHe has a good relationship with Sara Andrews and Sara Andrews  Childhood History:  Childhood History By whom was/is the patient raised?: Father Additional [place] history information[place]  Describes childhood as: I am the middle child. My mother ran around on my dad.  He came and got us when I was 2.  Description of patient's relationship with caregiver when they were a child: Mother: I did not have a relationship with her.  Father: he took care of me. Patient's description of current relationship with people who raised him/her: Mother: deceased. Father: deceased How were you disciplined when you got in trouble as a child/adolescent?: he was firm, rarely gave us a whipping, he would preach to us Does patient have siblings?: Yes Number of Siblings: 5 Description of patient's current relationship with siblings: One of my sisters is deceased.  I have a good relationship with my siblings Did patient suffer any verbal/emotional/physical/sexual abuse as a child?: No Did patient suffer from severe childhood neglect?: No Has patient ever been sexually abused/assaulted/raped as an adolescent or adult?: No Was the patient ever a victim of a crime or a disaster?: No Witnessed domestic violence?: No Has patient been effected by domestic violence as an adult?: No  CCA Part Two B  Employment/Work Situation: Employment / Work Psychologist, occupationalituation Employment situation: Unemployed(was terminated due to being out for [employer] comp. she was injured at work) Patient's job has been impacted by current illness: No What is the longest time patient has a held a job?: 874yrs Where was the patient employed at that time?: [employer] Has patient ever been in the Eli Lilly and Companymilitary?: No  Education: Education [employer] of [employer]: did not graduate. quit in the 10th grade at [employer]; reports that she has a 7th grade education Did You Graduate From [employer]?: No Did You Attend College?: No Did You Attend Graduate School?: No Did You Have An Individualized Education Program (IIEP): No Did You Have Any Difficulty At School?: No  Religion: Religion/Spirituality Are You A Religious Person?: Yes What is Your Religious Affiliation?: Baptist How Might This Affect Treatment?: denies  Leisure/Recreation: Leisure / Recreation Leisure and Hobbies: family gatherings, cookout, playing with Grandkids, [place]  Exercise/Diet: Exercise/Diet Do You Exercise?: No Have You Gained or Lost A Significant Amount of Weight in the Past Six Months?: No Do You Follow a Special Diet?: No Do You Have Any Trouble Sleeping?: No  CCA Part Two C  Alcohol/Drug Use: Alcohol / Drug Use Pain Medications: Patient unsure; see chart Prescriptions: Patient unsure; see chart Over the Counter: Vitamin D History of alcohol / drug use?: No history of alcohol / drug abuse                      CCA Part Three  ASAM's:  Six Dimensions of Multidimensional Assessment  Dimension 1:  Acute Intoxication and/or Withdrawal Potential:     Dimension 2:  Biomedical Conditions and Complications:     Dimension 3:  Emotional, Behavioral, or Cognitive Conditions and Complications:  Dimension 4:  Readiness to Change:     Dimension 5:  Relapse, Continued use, or Continued Problem Potential:     Dimension 6:  Recovery/Living Environment:      Substance use Disorder (SUD)    Social Function:  Social Functioning Social Maturity: Responsible Social Judgement: Normal  Stress:  Stress Stressors: Family conflict, Illness, Transitions Coping Ability: Overwhelmed Patient Takes Medications The Way The Doctor Instructed?: No Priority Risk: Low Acuity  Risk Assessment- Self-Harm Potential: Risk Assessment For Self-Harm Potential Thoughts of Self-Harm: No current thoughts Method: No  plan Availability of Means: No access/NA  Risk Assessment -Dangerous to Others Potential: Risk Assessment For Dangerous to Others Potential Method: No Plan Availability of Means: No access or NA Intent: Vague intent or NA Notification Required: No need or identified person  DSM5 Diagnoses: Patient Active Problem List   Diagnosis Date Noted  . Spondylosis of lumbar spine 11/06/2016  . Spinal stenosis of lumbar region with neurogenic claudication 10/28/2016  . Chronic bilateral low back pain with bilateral sciatica 09/16/2016  . Hyperglycemia 08/07/2016  . Moderate episode of recurrent major depressive disorder (HCC) 08/07/2016  . Pain of left heel 06/02/2016  . Closed fracture of lateral portion of left tibial plateau 03/10/2016  . Degenerative cervical disc 03/04/2016  . Gastroesophageal reflux disease without esophagitis 02/01/2016  . Primary osteoarthritis of right hand 02/01/2016  . Paresthesias in left hand 02/01/2016  . SOB (shortness of breath) 10/05/2013  . COPD (chronic obstructive pulmonary disease) (HCC) 10/05/2013  . History of smoking 10/05/2013  . Hyperlipidemia 10/05/2013  . Hypothyroidism 10/05/2013    Patient Centered Plan: Patient is on the following Treatment Plan(s):  Depression  Recommendations for Services/Supports/Treatments: Recommendations for Services/Supports/Treatments Recommendations For Services/Supports/Treatments: Individual Therapy, Medication Management  Treatment Plan Summary:    Referrals to Alternative Service(s): Referred to Alternative Service(s):   Place:   Date:   Time:    Referred to Alternative Service(s):   Place:   Date:   Time:    Referred to Alternative Service(s):   Place:   Date:   Time:    Referred to Alternative Service(s):   Place:   Date:   Time:     Marinda Elkicole M Peacock

## 2017-07-02 ENCOUNTER — Telehealth: Payer: Self-pay

## 2017-07-02 NOTE — Telephone Encounter (Signed)
Appt made for tomorrow morning with dr Carlynn Purlsowles

## 2017-07-02 NOTE — Telephone Encounter (Signed)
Copied from CRM [id number]. Topic: General - Other >> Jul 02, 2017  8:52 AM Trula SladeWalter, Linda F wrote: Reason for CRM: Patient would like a return call from Dr. Carlynn PurlSowles CMA.  She would not say what it is about, but that it's personal.

## 2017-07-02 NOTE — Telephone Encounter (Signed)
Patient would like to come in to be seen due to seeing a possible tape worm on her. Please schedule patient an appointment. Thanks

## 2017-07-03 ENCOUNTER — Ambulatory Visit: Payer: BLUE CROSS/BLUE SHIELD | Admitting: Family Medicine

## 2017-07-03 ENCOUNTER — Encounter: Payer: Self-pay | Admitting: Family Medicine

## 2017-07-03 DIAGNOSIS — J441 Chronic obstructive pulmonary disease with (acute) exacerbation: Secondary | ICD-10-CM | POA: Diagnosis not present

## 2017-07-03 DIAGNOSIS — F331 Major depressive disorder, recurrent, moderate: Secondary | ICD-10-CM | POA: Diagnosis not present

## 2017-07-03 MED ORDER — PREDNISONE 20 MG PO TABS: 20.0000 mg | ORAL_TABLET | Freq: Every day | ORAL | 0 refills | Status: DC

## 2017-07-03 MED ORDER — HYDROCOD POLST-CPM POLST ER 10-8 MG/5ML PO SUER: 5.0000 mL | Freq: Every evening | ORAL | 0 refills | Status: DC | PRN

## 2017-07-03 NOTE — Progress Notes (Signed)
Name: Sara Andrews   MRN: [id number]    DOB: Dec 21, 1955   Date:07/03/2017       Progress Note  Subjective  Chief Complaint  Chief Complaint  Patient presents with  . Advice Only    HPI  She came in today because it is the second time that a white material falls out of her umbilicus. She has a deep umbilicus , no redness or pain, the material came out by itself. She is worried that may be a worm  Depression: doing better, seen by psychiatrist and therapist, she is feeling much better now, also looking forward to have procedure on her back that should decrease the pain   COPD: she developed cold symptoms two weeks ago, initially had a fever, cough was productive, rhinorrhea and nasal congestion. She states symptoms have improved, however continues to have wheezing and cough is not back to baseline. She has also noticed some SOB with activity.   Patient Active Problem List   Diagnosis Date Noted  . Spondylosis of lumbar spine 11/06/2016  . Spinal stenosis of lumbar region with neurogenic claudication 10/28/2016  . Chronic bilateral low back pain with bilateral sciatica 09/16/2016  . Hyperglycemia 08/07/2016  . Moderate episode of recurrent major depressive disorder (HCC) 08/07/2016  . Pain of left heel 06/02/2016  . Closed fracture of lateral portion of left tibial plateau 03/10/2016  . Degenerative cervical disc 03/04/2016  . Gastroesophageal reflux disease without esophagitis 02/01/2016  . Primary osteoarthritis of right hand 02/01/2016  . Paresthesias in left hand 02/01/2016  . SOB (shortness of breath) 10/05/2013  . COPD (chronic obstructive pulmonary disease) (HCC) 10/05/2013  . History of smoking 10/05/2013  . Hyperlipidemia 10/05/2013  . Hypothyroidism 10/05/2013    Past Surgical History:  Procedure Laterality Date  . ABDOMINAL HYSTERECTOMY    . CARPAL TUNNEL RELEASE Right   . ORIF TIBIA PLATEAU Left 03/10/2016   Procedure: OPEN REDUCTION INTERNAL FIXATION (ORIF)  BICONDYLAR TIBIAL PLATEAU FRACTURE;  Surgeon: Eldred Manges, MD;  Location: [facility] OR;  Service: Orthopedics;  Laterality: Left;  . SHOULDER ARTHROSCOPY W/ ROTATOR CUFF REPAIR Right   . TONSILLECTOMY AND ADENOIDECTOMY      Family History  Problem Relation Age of Onset  . Hypertension Daughter   . Diabetes Father   . Heart disease Father   . Breast cancer Maternal Aunt 60  . Breast cancer Paternal Aunt 29  . Depression Sister   . Alcohol abuse Sister   . Anxiety disorder Sister   . Bipolar disorder Sister     Social History   Socioeconomic History  . Marital status: Widowed    Spouse name: Not on file  . Number of children: 3  . Years of education: Not on file  . Highest education level: 10th grade  Social Needs  . Financial resource strain: Not hard at all  . Food insecurity - worry: Never true  . Food insecurity - inability: Never true  . Transportation needs - medical: No  . Transportation needs - non-medical: No  Occupational History    Comment: retired  Tobacco Use  . Smoking status: Former Smoker    Packs/day: 2.00    Years: 30.00    Pack years: 60.00    Last attempt to quit: 09/03/2008    Years since quitting: 8.8  . Smokeless tobacco: Never Used  Substance and Sexual Activity  . Alcohol use: No  . Drug use: No  . Sexual activity: No  Other Topics Concern  .  Not on file  Social History Narrative   She used to work at  [employer], but had a left knee work related injury 03/10/2016 , require left knee surgery and is now having back pain, that is getting evaluated, Out of work since.      Current Outpatient Medications:  .  acyclovir (ZOVIRAX) 400 MG tablet, TAKE 1 TABLET(400 MG) BY MOUTH DAILY, Disp: 30 tablet, Rfl: 0 .  albuterol (PROVENTIL) (2.5 MG/3ML) 0.083% nebulizer solution, Take 3 mLs (2.5 mg total) by nebulization every 6 (six) hours as needed for wheezing or shortness of breath., Disp: 75 mL, Rfl: 12 .  aspirin 81 MG EC tablet, TK 1 T PO QD, Disp: ,  Rfl: 0 .  atorvastatin (LIPITOR) 40 MG tablet, Take 1 tablet (40 mg total) by mouth daily. In place of simvastatin, Disp: 90 tablet, Rfl: 1 .  BEVESPI AEROSPHERE 9-4.8 MCG/ACT AERO, Take 1 puff by mouth daily., Disp: , Rfl: 5 .  buPROPion (WELLBUTRIN XL) 150 MG 24 hr tablet, Take 1 tablet (150 mg total) by mouth daily., Disp: 30 tablet, Rfl: 1 .  Cholecalciferol (VITAMIN D PO), Take 1 capsule by mouth daily., Disp: , Rfl:  .  diclofenac sodium (VOLTAREN) 1 % GEL, Apply 4 g topically 2 (two) times daily as needed., Disp: 2 Tube, Rfl: 1 .  DULoxetine (CYMBALTA) 30 MG capsule, Take 2 capsules (60 mg total) by mouth daily., Disp: 60 capsule, Rfl: 1 .  fluocinonide (LIDEX) 0.05 % external solution, APP EXT AA BID, Disp: , Rfl: 0 .  gabapentin (NEURONTIN) 300 MG capsule, Take 1 capsule (300 mg total) by mouth 3 (three) times daily., Disp: 270 capsule, Rfl: 1 .  levothyroxine (SYNTHROID, LEVOTHROID) 88 MCG tablet, Take 1 tablet (88 mcg total) by mouth daily before breakfast., Disp: 90 tablet, Rfl: 1 .  Magnesium Oxide 400 MG CAPS, Take 1 capsule (400 mg total) by mouth once., Disp: 30 capsule, Rfl: 5 .  montelukast (SINGULAIR) 10 MG tablet, TAKE 1 TABLET(10 MG) BY MOUTH DAILY, Disp: 90 tablet, Rfl: 1 .  omeprazole (PRILOSEC) 40 MG capsule, Take 1 capsule (40 mg total) by mouth daily., Disp: 90 capsule, Rfl: 1 .  polyethylene glycol powder (GLYCOLAX/MIRALAX) powder, Take 17 g by mouth 2 (two) times daily as needed., Disp: 3350 g, Rfl: 1 .  Semaglutide (OZEMPIC) 0.25 or 0.5 MG/DOSE SOPN, Inject 0.5 mg into the skin once a week., Disp: 2 pen, Rfl: 2 .  tiotropium (SPIRIVA HANDIHALER) 18 MCG inhalation capsule, Place into inhaler and inhale., Disp: , Rfl:  .  tiZANidine (ZANAFLEX) 4 MG tablet, Take 1 tablet by mouth every 8 (eight) hours as needed for muscle spasms., Disp: , Rfl: 0 .  traZODone (DESYREL) 50 MG tablet, Take 0.5-1 tablets (25-50 mg total) by mouth at bedtime., Disp: 30 tablet, Rfl: 1  No Known  Allergies   ROS  Ten systems reviewed and is negative except as mentioned in HPI   Objective  Vitals:   07/03/17 0824  BP: 96/60  Pulse: 83  Resp: 12  SpO2: 96%  Weight: 182 lb 4.8 oz (82.7 kg)    Body mass index is 39.45 kg/m.  Physical Exam  Constitutional: Patient appears well-developed and well-nourished. Obese  No distress.  HEENT: head atraumatic, normocephalic, pupils equal and reactive to light,  neck supple, throat within normal limits Cardiovascular: Normal rate, regular rhythm and normal heart sounds.  No murmur heard. No BLE edema. Pulmonary/Chest: Effort normal and breath sounds normal. No respiratory  distress. Abdominal: Soft.  There is no tenderness. Deep umbilicus no signs of infection - she brought the debri that fell from her umbilicus, lint present  Psychiatric: Patient has a normal mood and affect. behavior is normal. Judgment and thought content normal.  PHQ2/9: Depression screen [facility] 04/28/2017 12/04/2016 08/07/2016 02/01/2016 04/30/2015  Decreased Interest 3 1 0 0 0  Down, Depressed, Hopeless 1 3 0 0 0  PHQ - 2 Score 4 4 0 0 0  Altered sleeping 2 3 - - -  Tired, decreased energy 3 2 - - -  Change in appetite 3 0 - - -  Feeling bad or failure about yourself  3 0 - - -  Trouble concentrating 3 1 - - -  Moving slowly or fidgety/restless 3 2 - - -  Suicidal thoughts 0 0 - - -  PHQ-9 Score 21 12 - - -  Difficult doing work/chores Somewhat difficult Very difficult - - -     Fall Risk: Fall Risk  07/03/2017 04/28/2017 12/04/2016 08/07/2016 02/01/2016  Falls in the past year? No No Yes Yes No  Number falls in past yr: - - 1 1 -  Injury with Fall? - - Yes Yes -  Comment - - Fracture her left knee - -  Follow up - - - Falls evaluation completed -     Functional Status Survey: Is the patient deaf or have difficulty hearing?: No Does the patient have difficulty seeing, even when wearing glasses/contacts?: No Does the patient have difficulty concentrating,  remembering, or making decisions?: No Does the patient have difficulty walking or climbing stairs?: No Does the patient have difficulty dressing or bathing?: No Does the patient have difficulty doing errands alone such as visiting a doctor's office or shopping?: No    Assessment & Plan  1. Anomalies of umbilicus  She has a deep umbilicus, explained that it was debris, not as worm as she was concerned about it.   2. COPD with acute exacerbation (HCC)  Resolving, still coughing even with inhalers, lungs sounds clear, she would like something to help with her cough at night - chlorpheniramine-HYDROcodone (TUSSIONEX PENNKINETIC ER) 10-8 MG/5ML SUER; Take 5 mLs by mouth at bedtime as needed.  Dispense: 115 mL; Refill: 0  - predniSONE (DELTASONE) 20 MG tablet; Take 1 tablet (20 mg total) by mouth daily with breakfast.  Dispense: 10 tablet; Refill: 0  3. Moderate episode of recurrent major depressive disorder (HCC)  Keep follow up with psychologist and psychiatrist. She also told me she was advised to take two levothyroxine pills daily, however just reviewed notes from psychiatrist and copied her instructions, I will hand it to patient today

## 2017-07-03 NOTE — Patient Instructions (Signed)
Plan as noted below  Depression Cymbalta 60 mg p.o. Daily Increase Wellbutrin to XL 150 mg p.o. daily to augment her Cymbalta.  Insomnia Trazodone 25-50 mg p.o. Nightly

## 2017-07-16 ENCOUNTER — Encounter: Payer: Self-pay | Admitting: Psychiatry

## 2017-07-16 ENCOUNTER — Ambulatory Visit: Payer: BLUE CROSS/BLUE SHIELD | Admitting: Psychiatry

## 2017-07-16 VITALS — BP 77/62 | HR 103 | Ht 59.0 in | Wt 182.2 lb

## 2017-07-16 DIAGNOSIS — F331 Major depressive disorder, recurrent, moderate: Secondary | ICD-10-CM | POA: Diagnosis not present

## 2017-07-16 DIAGNOSIS — F5105 Insomnia due to other mental disorder: Secondary | ICD-10-CM | POA: Diagnosis not present

## 2017-07-16 MED ORDER — TRAZODONE HCL 50 MG PO TABS: 25.0000 mg | ORAL_TABLET | Freq: Every day | ORAL | 1 refills | Status: DC

## 2017-07-16 MED ORDER — DULOXETINE HCL 30 MG PO CPEP: 60.0000 mg | ORAL_CAPSULE | Freq: Every day | ORAL | 1 refills | Status: DC

## 2017-07-16 MED ORDER — BUPROPION HCL ER (XL) 150 MG PO TB24: 150.0000 mg | ORAL_TABLET | Freq: Every day | ORAL | 1 refills | Status: DC

## 2017-07-16 NOTE — Progress Notes (Signed)
[facility] MD  OP Progress Note  07/16/2017 1:24 PM Sara Andrews  MRN:  [id number]  Chief Complaint: ' I am better.".  Chief Complaint    Depression     HPI: Sara Hashimotoatricia is a 61 year old Caucasian female who is on SSI, [place], widowed, has a history of MDD, multiple medical issues including DDD, chronic pain, diabetes mellitus, GERD, COPD, presented today for a follow-up visit.  Patient today reports that she has been feeling better on the current combination of medication .She reports that her crying spells and her low mood has improved.  She reports that she is also sleeping better.  She reports that she had a good Thanksgiving dinner with her family.  She also is looking forward to the holidays coming up.  She reports that she wants to spend her Christmas with her family and she looks forward to that.  She reports she has been compliant on her medications the Wellbutrin and the Cymbalta.  She denies any side effects to the current medications.  Patient reports trazodone has been helping her to sleep better.  She denies any suicidality or homicidality at this time.  She reports she has been working on her coping techniques to replace her negative thoughts by positive thoughts.  She reports she has been doing a good job with it.  She reports she continues to be in pain on and off.  She has DDD.  She denies any substance abuse problems.  Visit Diagnosis:    ICD-10-CM   1. Moderate episode of recurrent major depressive disorder (HCC) F33.1 DULoxetine (CYMBALTA) 30 MG capsule    buPROPion (WELLBUTRIN XL) 150 MG 24 hr tablet    traZODone (DESYREL) 50 MG tablet  2. Insomnia due to mental disorder F51.05     Past Psychiatric History: Depression after the death of her husband in 2008.  Denies history of suicide attempts or suicidal ideations.  Denies IP admissions.  Her PMD was managing her depressive symptoms in the past.  Past trials of Zoloft ( did not work) , Wellbutrin( worked)  .  Past Medical History:  Past Medical History:  Diagnosis Date  . Anxiety   . Arthritis    "hips, hands, back" (03/10/2016)  . Asthma   . Chronic bronchitis (HCC)   . COPD (chronic obstructive pulmonary disease) (HCC)   . Cough   . Depression   . Esophagitis, reflux   . GERD (gastroesophageal reflux disease)   . Hyperlipidemia   . Hypothyroidism   . IBS (irritable bowel syndrome)   . Lumbago   . Muscle pain   . Osteoarthritis   . Sinus disorder   . Thyroid disease   . Type 2 diabetes, diet controlled (HCC)   . Uncomplicated herpes simplex     Past Surgical History:  Procedure Laterality Date  . ABDOMINAL HYSTERECTOMY    . CARPAL TUNNEL RELEASE Right   . ORIF TIBIA PLATEAU Left 03/10/2016   Procedure: OPEN REDUCTION INTERNAL FIXATION (ORIF) BICONDYLAR TIBIAL PLATEAU FRACTURE;  Surgeon: Eldred MangesMark C Yates, MD;  Location: [facility] OR;  Service: Orthopedics;  Laterality: Left;  . SHOULDER ARTHROSCOPY W/ ROTATOR CUFF REPAIR Right   . TONSILLECTOMY AND ADENOIDECTOMY      Family Psychiatric History: Daughter - Mental health issues, sister-suicidal ideation, drug abuse.  Family History:  Family History  Problem Relation Age of Onset  . Hypertension Daughter   . Diabetes Father   . Heart disease Father   . Breast cancer Maternal Aunt 60  .  Breast cancer Paternal Aunt [id number]  . Depression Sister   . Alcohol abuse Sister   . Anxiety disorder Sister   . Bipolar disorder Sister    Substance abuse history; Denies  Social History: Raised by her father, her mother left her when she was 61 years old.  She is currently widowed after 35 years of marriage.  Has 3 daughters, 2 of them are very supportive and the youngest has her own issues and patient has a rocky relationship with her.  She has a lot of grandkids and she enjoys taking care of them.  She supports herself with SSI as well as Worker's Comp. Social History   Socioeconomic History  . Marital status: Widowed    Spouse name: None  .  Number of children: 3  . Years of education: None  . Highest education level: 10th grade  Social Needs  . Financial resource strain: Not hard at all  . Food insecurity - worry: Never true  . Food insecurity - inability: Never true  . Transportation needs - medical: No  . Transportation needs - non-medical: No  Occupational History    Comment: retired  Tobacco Use  . Smoking status: Former Smoker    Packs/day: 2.00    Years: 30.00    Pack years: 60.00    Last attempt to quit: 09/03/2008    Years since quitting: 8.8  . Smokeless tobacco: Never Used  Substance and Sexual Activity  . Alcohol use: No  . Drug use: No  . Sexual activity: No  Other Topics Concern  . None  Social History Narrative   She used to work at  [employer], but had a left knee work related injury 03/10/2016 , require left knee surgery and is now having back pain, that is getting evaluated, Out of work since.     Allergies: No Known Allergies  Metabolic Disorder Labs: Lab Results  Component Value Date   HGBA1C 6.0 01/21/2017   No results found for: PROLACTIN Lab Results  Component Value Date   CHOL 159 12/28/2015   TRIG 96 01/21/2017   HDL 48 01/21/2017   CHOLHDL 2.7 12/28/2015   LDLCALC 56 01/21/2017   LDLCALC 87 12/28/2015   Lab Results  Component Value Date   TSH 0.95 01/21/2017   TSH 4.21 08/07/2016    Therapeutic Level Labs: No results found for: LITHIUM No results found for: VALPROATE No components found for:  CBMZ  Current Medications: Current Outpatient Medications  Medication Sig Dispense Refill  . acyclovir (ZOVIRAX) 400 MG tablet TAKE 1 TABLET(400 MG) BY MOUTH DAILY 30 tablet 0  . albuterol (PROVENTIL) (2.5 MG/3ML) 0.083% nebulizer solution Take 3 mLs (2.5 mg total) by nebulization every 6 (six) hours as needed for wheezing or shortness of breath. 75 mL 12  . aspirin 81 MG EC tablet TK 1 T PO QD  0  . atorvastatin (LIPITOR) 40 MG tablet Take 1 tablet (40 mg total) by mouth  daily. In place of simvastatin 90 tablet 1  . BEVESPI AEROSPHERE 9-4.8 MCG/ACT AERO Take 1 puff by mouth daily.  5  . buPROPion (WELLBUTRIN XL) 150 MG 24 hr tablet Take 1 tablet (150 mg total) by mouth daily. 30 tablet 1  . chlorpheniramine-HYDROcodone (TUSSIONEX PENNKINETIC ER) 10-8 MG/5ML SUER Take 5 mLs by mouth at bedtime as needed. 115 mL 0  . Cholecalciferol (VITAMIN D PO) Take 1 capsule by mouth daily.    . diclofenac sodium (VOLTAREN) 1 % GEL Apply 4 g topically  2 (two) times daily as needed. 2 Tube 1  . DULoxetine (CYMBALTA) 30 MG capsule Take 2 capsules (60 mg total) by mouth daily. 60 capsule 1  . fluocinonide (LIDEX) 0.05 % external solution APP EXT AA BID  0  . gabapentin (NEURONTIN) 300 MG capsule Take 1 capsule (300 mg total) by mouth 3 (three) times daily. 270 capsule 1  . levothyroxine (SYNTHROID, LEVOTHROID) 88 MCG tablet Take 1 tablet (88 mcg total) by mouth daily before breakfast. 90 tablet 1  . Magnesium Oxide 400 MG CAPS Take 1 capsule (400 mg total) by mouth once. 30 capsule 5  . montelukast (SINGULAIR) 10 MG tablet TAKE 1 TABLET(10 MG) BY MOUTH DAILY 90 tablet 1  . omeprazole (PRILOSEC) 40 MG capsule Take 1 capsule (40 mg total) by mouth daily. 90 capsule 1  . polyethylene glycol powder (GLYCOLAX/MIRALAX) powder Take 17 g by mouth 2 (two) times daily as needed. 3350 g 1  . predniSONE (DELTASONE) 20 MG tablet Take 1 tablet (20 mg total) by mouth daily with breakfast. 10 tablet 0  . Semaglutide (OZEMPIC) 0.25 or 0.5 MG/DOSE SOPN Inject 0.5 mg into the skin once a week. 2 pen 2  . tiotropium (SPIRIVA HANDIHALER) 18 MCG inhalation capsule Place into inhaler and inhale.    Marland Kitchen tiZANidine (ZANAFLEX) 4 MG tablet Take 1 tablet by mouth every 8 (eight) hours as needed for muscle spasms.  0  . traZODone (DESYREL) 50 MG tablet Take 0.5-1 tablets (25-50 mg total) by mouth at bedtime. 30 tablet 1   No current facility-administered medications for this visit.       Musculoskeletal: Strength & Muscle Tone: within normal limits Gait & Station: normal Patient leans: N/A  Psychiatric Specialty Exam: Review of Systems  Psychiatric/Behavioral: Positive for depression (improving).  All other systems reviewed and are negative.   Blood pressure (!) 77/62, pulse (!) 103, height 4\' 11"  (1.499 m), weight 182 lb 3.2 oz (82.6 kg).Body mass index is 36.8 kg/m.  General Appearance: Casual  Eye Contact:  Fair  Speech:  Clear and Coherent  Volume:  Normal  Mood:  Euthymic  Affect:  Congruent  Thought Process:  Goal Directed and Descriptions of Associations: Intact  Orientation:  Full (Time, Place, and Person)  Thought Content: Logical   Suicidal Thoughts:  No  Homicidal Thoughts:  No  Memory:  Immediate;   Fair Recent;   Fair Remote;   Fair  Judgement:  Fair  Insight:  Fair  Psychomotor Activity:  Normal  Concentration:  Concentration: Fair and Attention Span: Fair  Recall:  [employer] of Knowledge: Fair  Language: Fair  Akathisia:  No  Handed:  Right  AIMS (if indicated): na  Assets:  Communication Skills Desire for Improvement Financial Resources/Insurance Housing Social Support Talents/Skills Transportation Vocational/Educational  ADL's:  Intact  Cognition: WNL  Sleep:  Fair   Screenings: PHQ2-9     Office Visit from 04/28/2017 in [facility] Office Visit from 12/04/2016 in [facility] Office Visit from 08/07/2016 in [facility] Office Visit from 02/01/2016 in [facility] Office Visit from 04/30/2015 in [facility]  PHQ-2 Total Score  4  4  0  0  0  PHQ-9 Total Score  21  12  No data  No data  No data       Assessment and Plan: Chloeann is a 61 year old Caucasian female who is widowed, has a history of mental illness as well  as medical issues, chronic pain, who presented to the clinic today for a follow-up visit.  Dorota currently  reports her depressive symptoms have been improving on the current combination of medication.  She remains compliant.  She also follow-up with Ms. Peacock for continued psychotherapy.  Plan as noted below. Plan  Depression Cymbalta 60 mg p.o. daily Wellbutrin XL 150 mg p.o. daily to augment her Cymbalta.   Insomnia Trazodone 25-50 mg p.o. Nightly  Continue CBT with Ms. Peacock.  Follow-up in 8 weeks or sooner if needed.  More than 50 % of the time was spent for psychoeducation and supportive psychotherapy and care coordination.  This note was generated in part or whole with voice recognition software. Voice recognition is usually quite accurate but there are transcription errors that can and very often do occur. I apologize for any typographical errors that were not detected and corrected.        Jomarie Longs, MD 07/16/2017, 1:24 PM

## 2017-07-19 ENCOUNTER — Other Ambulatory Visit: Payer: Self-pay | Admitting: Family Medicine

## 2017-07-19 DIAGNOSIS — E785 Hyperlipidemia, unspecified: Secondary | ICD-10-CM

## 2017-07-27 ENCOUNTER — Ambulatory Visit: Payer: BLUE CROSS/BLUE SHIELD | Admitting: Licensed Clinical Social Worker

## 2017-07-29 ENCOUNTER — Encounter: Payer: Self-pay | Admitting: Family Medicine

## 2017-07-29 ENCOUNTER — Ambulatory Visit: Payer: BLUE CROSS/BLUE SHIELD | Admitting: Family Medicine

## 2017-07-29 VITALS — BP 104/76 | HR 101 | Resp 14 | Ht <= 58 in | Wt 178.7 lb

## 2017-07-29 DIAGNOSIS — E1169 Type 2 diabetes mellitus with other specified complication: Secondary | ICD-10-CM

## 2017-07-29 DIAGNOSIS — E038 Other specified hypothyroidism: Secondary | ICD-10-CM | POA: Diagnosis not present

## 2017-07-29 DIAGNOSIS — J45901 Unspecified asthma with (acute) exacerbation: Secondary | ICD-10-CM

## 2017-07-29 DIAGNOSIS — J441 Chronic obstructive pulmonary disease with (acute) exacerbation: Secondary | ICD-10-CM | POA: Diagnosis not present

## 2017-07-29 DIAGNOSIS — M48062 Spinal stenosis, lumbar region with neurogenic claudication: Secondary | ICD-10-CM | POA: Diagnosis not present

## 2017-07-29 DIAGNOSIS — E669 Obesity, unspecified: Secondary | ICD-10-CM | POA: Diagnosis not present

## 2017-07-29 DIAGNOSIS — E785 Hyperlipidemia, unspecified: Secondary | ICD-10-CM | POA: Diagnosis not present

## 2017-07-29 MED ORDER — ATORVASTATIN CALCIUM 40 MG PO TABS: 40.0000 mg | ORAL_TABLET | Freq: Every day | ORAL | 1 refills | Status: DC

## 2017-07-29 MED ORDER — ALBUTEROL SULFATE (2.5 MG/3ML) 0.083% IN NEBU: 2.5000 mg | INHALATION_SOLUTION | Freq: Four times a day (QID) | RESPIRATORY_TRACT | 2 refills | Status: DC | PRN

## 2017-07-29 MED ORDER — AMOXICILLIN-POT CLAVULANATE 875-125 MG PO TABS: 1.0000 | ORAL_TABLET | Freq: Two times a day (BID) | ORAL | 0 refills | Status: DC

## 2017-07-29 MED ORDER — SEMAGLUTIDE(0.25 OR 0.5MG/DOS) 2 MG/1.5ML ~~LOC~~ SOPN: 0.5000 mg | PEN_INJECTOR | SUBCUTANEOUS | 2 refills | Status: DC

## 2017-07-29 MED ORDER — LEVOTHYROXINE SODIUM 88 MCG PO TABS: 88.0000 ug | ORAL_TABLET | Freq: Every day | ORAL | 1 refills | Status: DC

## 2017-07-29 NOTE — Progress Notes (Signed)
Name: Sara Andrews   MRN: [id number]    DOB: 12/25/1955   Date:07/29/2017       Progress Note  Subjective  Chief Complaint  Chief Complaint  Patient presents with  . Hyperlipidemia  . COPD  . Cough  . Leg Pain    HPI  DDD and spondylolisthesis: on gabapentin, seen by ortho and advised surgery but she would like second opinion from another workman's comp doctor, also recommended knee surgery. She is now seeing Dr. Delila Pereyra, taking Naproxen and Baclofen but only taking one gabapentin daily, having a procedure in a few weeks, but is in severe pain, needs to increase gabapentin to three times daily. Pain right now is 8/10  COPD: she is on Bevespi since 2017. She quit smoking in 2009. She has chronic cough that is occasionally productive,however since Dec, symptoms much worse, she was given prednisone and cough medication 2/07 and is still having a lot of productive cough, sometimes it makes her gag and some wheezing, we will give her antibiotics today.   GERD: she is off Omeprazole and is taking Ranitidine however states symptoms are getting much worse, having eructation and also heartburn, since started GLP-1 agonist can cause early satiety, explained importance of portion control.. She is aware of long term side effects of PPI, but she states she needs relieve.   Hypothyroidism: reviewed labs and TSH at goal, continue current dose, she states constipation is stable, we still don't have results of Cologuard. Taking miralax daily   DM II : diagnosed at work back in 2012she used to take medications, last hgbA1C was 6.2%, down to 6.0 we will recheck labs todau , elevated fasting insulin. She denies polyphagia, polyuria , she has polydipsia. She needs eye exam. Started on Trulicity June 2018, having some constipation and indigestion. Discussed high fiber diet , changed to Ozempic 04/2017 and is down 11 lbs since. Needs eye exam   Patient Active Problem List   Diagnosis Date Noted  .  Spondylosis of lumbar spine 11/06/2016  . Spinal stenosis of lumbar region with neurogenic claudication 10/28/2016  . Chronic bilateral low back pain with bilateral sciatica 09/16/2016  . Hyperglycemia 08/07/2016  . Moderate episode of recurrent major depressive disorder (HCC) 08/07/2016  . Pain of left heel 06/02/2016  . Closed fracture of lateral portion of left tibial plateau 03/10/2016  . Degenerative cervical disc 03/04/2016  . Gastroesophageal reflux disease without esophagitis 02/01/2016  . Primary osteoarthritis of right hand 02/01/2016  . Paresthesias in left hand 02/01/2016  . SOB (shortness of breath) 10/05/2013  . COPD (chronic obstructive pulmonary disease) (HCC) 10/05/2013  . History of smoking 10/05/2013  . Hyperlipidemia 10/05/2013  . Hypothyroidism 10/05/2013    Past Surgical History:  Procedure Laterality Date  . ABDOMINAL HYSTERECTOMY    . CARPAL TUNNEL RELEASE Right   . ORIF TIBIA PLATEAU Left 03/10/2016   Procedure: OPEN REDUCTION INTERNAL FIXATION (ORIF) BICONDYLAR TIBIAL PLATEAU FRACTURE;  Surgeon: Eldred Manges, MD;  Location: [facility] OR;  Service: Orthopedics;  Laterality: Left;  . SHOULDER ARTHROSCOPY W/ ROTATOR CUFF REPAIR Right   . TONSILLECTOMY AND ADENOIDECTOMY      Family History  Problem Relation Age of Onset  . Hypertension Daughter   . Diabetes Father   . Heart disease Father   . Breast cancer Maternal Aunt 60  . Breast cancer Paternal Aunt 93  . Depression Sister   . Alcohol abuse Sister   . Anxiety disorder Sister   . Bipolar disorder Sister  Social History   Socioeconomic History  . Marital status: Widowed    Spouse name: Not on file  . Number of children: 3  . Years of education: Not on file  . Highest education level: 10th grade  Social Needs  . Financial resource strain: Not hard at all  . Food insecurity - worry: Never true  . Food insecurity - inability: Never true  . Transportation needs - medical: No  . Transportation needs  - non-medical: No  Occupational History    Comment: retired  Tobacco Use  . Smoking status: Former Smoker    Packs/day: 2.00    Years: 30.00    Pack years: 60.00    Last attempt to quit: 09/03/2008    Years since quitting: 8.9  . Smokeless tobacco: Never Used  Substance and Sexual Activity  . Alcohol use: No  . Drug use: No  . Sexual activity: No  Other Topics Concern  . Not on file  Social History Narrative   She used to work at  [employer], but had a left knee work related injury 03/10/2016 , require left knee surgery and is now having back pain, that is getting evaluated, Out of work since.      Current Outpatient Medications:  .  acyclovir (ZOVIRAX) 400 MG tablet, TAKE 1 TABLET(400 MG) BY MOUTH DAILY, Disp: 30 tablet, Rfl: 0 .  albuterol (PROVENTIL) (2.5 MG/3ML) 0.083% nebulizer solution, Take 3 mLs (2.5 mg total) by nebulization every 6 (six) hours as needed for wheezing or shortness of breath., Disp: 75 mL, Rfl: 2 .  aspirin 81 MG EC tablet, TK 1 T PO QD, Disp: , Rfl: 0 .  atorvastatin (LIPITOR) 40 MG tablet, Take 1 tablet (40 mg total) by mouth daily. In place of simvastatin, Disp: 90 tablet, Rfl: 1 .  BEVESPI AEROSPHERE 9-4.8 MCG/ACT AERO, Take 1 puff by mouth daily., Disp: , Rfl: 5 .  buPROPion (WELLBUTRIN XL) 150 MG 24 hr tablet, Take 1 tablet (150 mg total) by mouth daily., Disp: 30 tablet, Rfl: 1 .  Cholecalciferol (VITAMIN D PO), Take 1 capsule by mouth daily., Disp: , Rfl:  .  diclofenac sodium (VOLTAREN) 1 % GEL, Apply 4 g topically 2 (two) times daily as needed., Disp: 2 Tube, Rfl: 1 .  DULoxetine (CYMBALTA) 30 MG capsule, Take 2 capsules (60 mg total) by mouth daily., Disp: 60 capsule, Rfl: 1 .  fluocinonide (LIDEX) 0.05 % external solution, APP EXT AA BID, Disp: , Rfl: 0 .  gabapentin (NEURONTIN) 300 MG capsule, Take 1 capsule (300 mg total) by mouth 3 (three) times daily., Disp: 270 capsule, Rfl: 1 .  levothyroxine (SYNTHROID, LEVOTHROID) 88 MCG tablet, Take 1  tablet (88 mcg total) by mouth daily before breakfast., Disp: 90 tablet, Rfl: 1 .  Magnesium Oxide 400 MG CAPS, Take 1 capsule (400 mg total) by mouth once., Disp: 30 capsule, Rfl: 5 .  montelukast (SINGULAIR) 10 MG tablet, TAKE 1 TABLET(10 MG) BY MOUTH DAILY, Disp: 90 tablet, Rfl: 1 .  omeprazole (PRILOSEC) 40 MG capsule, Take 1 capsule (40 mg total) by mouth daily., Disp: 90 capsule, Rfl: 1 .  polyethylene glycol powder (GLYCOLAX/MIRALAX) powder, Take 17 g by mouth 2 (two) times daily as needed., Disp: 3350 g, Rfl: 1 .  Semaglutide (OZEMPIC) 0.25 or 0.5 MG/DOSE SOPN, Inject 0.5 mg into the skin once a week., Disp: 2 pen, Rfl: 2 .  tiZANidine (ZANAFLEX) 4 MG tablet, Take 1 tablet by mouth every 8 (eight) hours as  needed for muscle spasms., Disp: , Rfl: 0 .  traZODone (DESYREL) 50 MG tablet, Take 0.5-1 tablets (25-50 mg total) by mouth at bedtime., Disp: 30 tablet, Rfl: 1 .  amoxicillin-clavulanate (AUGMENTIN) 875-125 MG tablet, Take 1 tablet by mouth 2 (two) times daily., Disp: 14 tablet, Rfl: 0 .  naproxen (NAPROSYN) 500 MG tablet, Take 1 tablet by mouth 2 (two) times daily., Disp: , Rfl: 1  No Known Allergies   ROS  Constitutional: Negative for fever , positive for mild weight change.  Respiratory: Positive for cough but stable shortness of breath.   Cardiovascular: Negative for chest pain or palpitations.  Gastrointestinal: Negative for abdominal pain, no bowel changes.  Musculoskeletal: Positive  For intermittent  gait problem but no joint swelling.  Skin: Negative for rash.  Neurological: Negative for dizziness, positive for  Headache - from coughing so hard.  No other specific complaints in a complete review of systems (except as listed in HPI above).  Objective  Vitals:   07/29/17 0843  BP: 104/76  Pulse: (!) 101  Resp: 14  SpO2: 97%  Weight: 178 lb 11.2 oz (81.1 kg)  Height: 4\' 9"  (1.448 m)    Body mass index is 38.67 kg/m.  Physical Exam  Constitutional: Patient  appears well-developed and well-nourished. Obese  No distress.  HEENT: head atraumatic, normocephalic, pupils equal and reactive to light,  neck supple, throat within normal limits Cardiovascular: Normal rate, regular rhythm and normal heart sounds.  No murmur heard. No BLE edema. Pulmonary/Chest: Effort normal and breath sounds normal. No respiratory distress. Abdominal: Soft.  There is no tenderness. Psychiatric: Patient has a normal mood and affect. behavior is normal. Judgment and thought content normal. Muscular skeletal: pain during palpation of lumbar spine, negative straight leg raise   PHQ2/9: Depression screen [facility] 2/9 04/28/2017 12/04/2016 08/07/2016 02/01/2016 04/30/2015  Decreased Interest 3 1 0 0 0  Down, Depressed, Hopeless 1 3 0 0 0  PHQ - 2 Score 4 4 0 0 0  Altered sleeping 2 3 - - -  Tired, decreased energy 3 2 - - -  Change in appetite 3 0 - - -  Feeling bad or failure about yourself  3 0 - - -  Trouble concentrating 3 1 - - -  Moving slowly or fidgety/restless 3 2 - - -  Suicidal thoughts 0 0 - - -  PHQ-9 Score 21 12 - - -  Difficult doing work/chores Somewhat difficult Very difficult - - -     Fall Risk: Fall Risk  07/29/2017 07/03/2017 04/28/2017 12/04/2016 08/07/2016  Falls in the past year? No No No Yes Yes  Number falls in past yr: - - - 1 1  Injury with Fall? - - - Yes Yes  Comment - - - Fracture her left knee -  Follow up - - - - Falls evaluation completed     Functional Status Survey: Is the patient deaf or have difficulty hearing?: No Does the patient have difficulty seeing, even when wearing glasses/contacts?: No Does the patient have difficulty concentrating, remembering, or making decisions?: No Does the patient have difficulty walking or climbing stairs?: No Does the patient have difficulty dressing or bathing?: No Does the patient have difficulty doing errands alone such as visiting a doctor's office or shopping?: No    Assessment & Plan  1.  Diabetes mellitus type 2 in obese (HCC)  - Semaglutide (OZEMPIC) 0.25 or 0.5 MG/DOSE SOPN; Inject 0.5 mg into the skin once a week.  Dispense: 2 pen; Refill: 2 - Hemoglobin A1c  2. Dyslipidemia  - atorvastatin (LIPITOR) 40 MG tablet; Take 1 tablet (40 mg total) by mouth daily. In place of simvastatin  Dispense: 90 tablet; Refill: 1  3. Other specified hypothyroidism  - levothyroxine (SYNTHROID, LEVOTHROID) 88 MCG tablet; Take 1 tablet (88 mcg total) by mouth daily before breakfast.  Dispense: 90 tablet; Refill: 1 - TSH  4. Acute exacerbation of COPD with asthma (HCC)  - albuterol (PROVENTIL) (2.5 MG/3ML) 0.083% nebulizer solution; Take 3 mLs (2.5 mg total) by nebulization every 6 (six) hours as needed for wheezing or shortness of breath.  Dispense: 75 mL; Refill: 2 - amoxicillin-clavulanate (AUGMENTIN) 875-125 MG tablet; Take 1 tablet by mouth 2 (two) times daily.  Dispense: 14 tablet; Refill: 0  5. Spinal stenosis of lumbar region with neurogenic claudication  Going for a procedure in a few weeks, needs to take gabapentin three times daily instead of just at night time

## 2017-07-30 LAB — HEMOGLOBIN A1C
EAG (MMOL/L): 6.3 (calc)
HEMOGLOBIN A1C: 5.6 %{Hb} (ref <5.7)
MEAN PLASMA GLUCOSE: 114 (calc)

## 2017-07-30 LAB — TSH: TSH: 2.01 mIU/L (ref 0.40–4.50)

## 2017-07-31 ENCOUNTER — Other Ambulatory Visit: Payer: Self-pay | Admitting: Family Medicine

## 2017-07-31 MED ORDER — ONDANSETRON HCL 4 MG PO TABS: 4.0000 mg | ORAL_TABLET | Freq: Three times a day (TID) | ORAL | 0 refills | Status: DC | PRN

## 2017-08-14 ENCOUNTER — Other Ambulatory Visit: Payer: Self-pay

## 2017-08-14 DIAGNOSIS — E669 Obesity, unspecified: Principal | ICD-10-CM

## 2017-08-14 DIAGNOSIS — E1169 Type 2 diabetes mellitus with other specified complication: Secondary | ICD-10-CM

## 2017-08-14 MED ORDER — SEMAGLUTIDE(0.25 OR 0.5MG/DOS) 2 MG/1.5ML ~~LOC~~ SOPN: 0.5000 mg | PEN_INJECTOR | SUBCUTANEOUS | 3 refills | Status: DC

## 2017-08-14 NOTE — Telephone Encounter (Signed)
Refill request for general medication: Ozempic 0.25 or 0.5  Last office visit: 07/29/2017  Last physical exam: None indicated  Follow-up on file. 10/28/2017  Insurance will only pay for 1 pen.

## 2017-08-26 ENCOUNTER — Ambulatory Visit: Payer: Self-pay

## 2017-08-27 ENCOUNTER — Ambulatory Visit: Payer: BLUE CROSS/BLUE SHIELD | Admitting: Family Medicine

## 2017-08-27 ENCOUNTER — Encounter: Payer: Self-pay | Admitting: Family Medicine

## 2017-08-27 VITALS — BP 110/72 | HR 98 | Temp 98.3°F | Resp 16 | Ht <= 58 in | Wt 174.3 lb

## 2017-08-27 DIAGNOSIS — R252 Cramp and spasm: Secondary | ICD-10-CM

## 2017-08-27 DIAGNOSIS — M62831 Muscle spasm of calf: Secondary | ICD-10-CM

## 2017-08-27 LAB — BASIC METABOLIC PANEL WITH GFR
BUN: 18 mg/dL (ref 7–25)
CHLORIDE: 104 mmol/L (ref 98–110)
CO2: 28 mmol/L (ref 20–32)
Calcium: 9.5 mg/dL (ref 8.6–10.4)
Creat: 0.86 mg/dL (ref 0.50–0.99)
GFR, Est African American: 83 mL/min/{1.73_m2} (ref >=60)
GFR, Est Non African American: 72 mL/min/{1.73_m2} (ref >=60)
GLUCOSE: 91 mg/dL (ref 65–99)
Potassium: 4.2 mmol/L (ref 3.5–5.3)
Sodium: 138 mmol/L (ref 135–146)

## 2017-08-27 LAB — MAGNESIUM: MAGNESIUM: 2.2 mg/dL (ref 1.5–2.5)

## 2017-08-27 MED ORDER — TIZANIDINE HCL 4 MG PO TABS: 2.0000 mg | ORAL_TABLET | Freq: Three times a day (TID) | ORAL | 0 refills | Status: DC | PRN

## 2017-08-27 NOTE — Patient Instructions (Addendum)
Make sure you drink plenty of water.  Please perform gentle stretching each night before you go to bed. Muscle Cramps and Spasms Muscle cramps and spasms are when muscles tighten by themselves. They usually get better within minutes. Muscle cramps are painful. They are usually stronger and last longer than muscle spasms. Muscle spasms may or may not be painful. They can last a few seconds or much longer. Follow these instructions at home:  Drink enough fluid to keep your pee (urine) clear or pale yellow.  Massage, stretch, and relax the muscle.  If directed, apply heat to tight or tense muscles as often as told by your doctor. Use the heat source that your doctor recommends. ? Place a towel between your skin and the heat source. ? Leave the heat on for 20-30 minutes. ? Take off the heat if your skin turns bright red. This is especially important if you are unable to feel pain, heat, or cold. You may have a greater risk of getting burned.  If directed, put ice on the affected area. This may help if you are sore or have pain after a cramp or spasm. ? Put ice in a plastic bag. ? Place a towel between your skin and the bag. ? Leave the ice on for 20 minutes, 2-3 times a day.  Take over-the-counter and prescription medicines only as told by your doctor.  Pay attention to any changes in your symptoms. Contact a doctor if:  Your cramps or spasms get worse or happen more often.  Your cramps or spasms do not get better with time. This information is not intended to replace advice given to you by your health care provider. Make sure you discuss any questions you have with your health care provider. Document Released: 06/26/2008 Document Revised: 08/15/2015 Document Reviewed: 04/17/2015 Elsevier Interactive Patient Education  2018 [employer] Leg Cramps Leg cramps occur when a muscle or muscles tighten and you have no control over this tightening (involuntary muscle contraction). Muscle  cramps can develop in any muscle, but the most common place is in the calf muscles of the leg. Those cramps can occur during exercise or when you are at rest. Leg cramps are painful, and they may last for a few seconds to a few minutes. Cramps may return several times before they finally stop. Usually, leg cramps are not caused by a serious medical problem. In many cases, the cause is not known. Some common causes include:  Overexertion.  Overuse from repetitive motions, or doing the same thing over and over.  Remaining in a certain position for a long period of time.  Improper preparation, form, or technique while performing a sport or an activity.  Dehydration.  Injury.  Side effects of some medicines.  Abnormally low levels of the salts and ions in your blood (electrolytes), especially potassium and calcium. These levels could be low if you are taking water pills (diuretics) or if you are pregnant.  Follow these instructions at home: Watch your condition for any changes. Taking the following actions may help to lessen any discomfort that you are feeling:  Stay well-hydrated. Drink enough fluid to keep your urine clear or pale yellow.  Try massaging, stretching, and relaxing the affected muscle. Do this for several minutes at a time.  For tight or tense muscles, use a warm towel, heating pad, or hot shower water directed to the affected area.  If you are sore or have pain after a cramp, applying ice to the affected  area may relieve discomfort. ? Put ice in a plastic bag. ? Place a towel between your skin and the bag. ? Leave the ice on for 20 minutes, 2-3 times per day.  Avoid strenuous exercise for several days if you have been having frequent leg cramps.  Make sure that your diet includes the essential minerals for your muscles to work normally.  Take medicines only as directed by your health care provider.  Contact a health care provider if:  Your leg cramps get more  severe or more frequent, or they do not improve over time.  Your foot becomes cold, numb, or blue. This information is not intended to replace advice given to you by your health care provider. Make sure you discuss any questions you have with your health care provider. Document Released: 08/21/2004 Document Revised: 12/20/2015 Document Reviewed: 06/21/2014 Elsevier Interactive Patient Education  [employer]2018 [employer] Inc[employer]

## 2017-08-27 NOTE — Progress Notes (Signed)
Name: Sara Andrews   MRN: [id number]    DOB: 01/31/1956   Date:08/27/2017       Progress Note  Subjective  Chief Complaint  Chief Complaint  Patient presents with  . Leg Pain    leg and foot cramps and pains bilateral    HPI  Pt presents with 5-6 day history of new onset BLE muscle cramping/spasm. Takes trazodone and gabapentin at night as prescribed, waking up at 2-3am with cramping in BLE -calves and soles of feet.  She is able to get up and walk until the pain decreases so that she can go back to sleep.  Spasms do not happen during the day, only at night.  No recent changes in diet; does PT twice a week, does home exercises on her off days - no recent changes to her exercise regimen. No recent overuse or injury.  She ran out of her magnesium supplement about 3 days ago - she has been on this supplement for several years.  She drinks plenty of water throughout the day.  Patient Active Problem List   Diagnosis Date Noted  . Spondylosis of lumbar spine 11/06/2016  . Spinal stenosis of lumbar region with neurogenic claudication 10/28/2016  . Chronic bilateral low back pain with bilateral sciatica 09/16/2016  . Hyperglycemia 08/07/2016  . Moderate episode of recurrent major depressive disorder (HCC) 08/07/2016  . Pain of left heel 06/02/2016  . Closed fracture of lateral portion of left tibial plateau 03/10/2016  . Degenerative cervical disc 03/04/2016  . Gastroesophageal reflux disease without esophagitis 02/01/2016  . Primary osteoarthritis of right hand 02/01/2016  . Paresthesias in left hand 02/01/2016  . SOB (shortness of breath) 10/05/2013  . COPD (chronic obstructive pulmonary disease) (HCC) 10/05/2013  . History of smoking 10/05/2013  . Hyperlipidemia 10/05/2013  . Hypothyroidism 10/05/2013    Social History   Tobacco Use  . Smoking status: Former Smoker    Packs/day: 2.00    Years: 30.00    Pack years: 60.00    Last attempt to quit: 09/03/2008    Years since  quitting: 8.9  . Smokeless tobacco: Never Used  Substance Use Topics  . Alcohol use: No     Current Outpatient Medications:  .  acyclovir (ZOVIRAX) 400 MG tablet, TAKE 1 TABLET(400 MG) BY MOUTH DAILY, Disp: 30 tablet, Rfl: 0 .  albuterol (PROVENTIL) (2.5 MG/3ML) 0.083% nebulizer solution, Take 3 mLs (2.5 mg total) by nebulization every 6 (six) hours as needed for wheezing or shortness of breath., Disp: 75 mL, Rfl: 2 .  aspirin 81 MG EC tablet, TK 1 T PO QD, Disp: , Rfl: 0 .  atorvastatin (LIPITOR) 40 MG tablet, Take 1 tablet (40 mg total) by mouth daily. In place of simvastatin, Disp: 90 tablet, Rfl: 1 .  BEVESPI AEROSPHERE 9-4.8 MCG/ACT AERO, Take 1 puff by mouth daily., Disp: , Rfl: 5 .  buPROPion (WELLBUTRIN XL) 150 MG 24 hr tablet, Take 1 tablet (150 mg total) by mouth daily., Disp: 30 tablet, Rfl: 1 .  Cholecalciferol (VITAMIN D PO), Take 1 capsule by mouth daily., Disp: , Rfl:  .  diclofenac sodium (VOLTAREN) 1 % GEL, Apply 4 g topically 2 (two) times daily as needed., Disp: 2 Tube, Rfl: 1 .  DULoxetine (CYMBALTA) 30 MG capsule, Take 2 capsules (60 mg total) by mouth daily., Disp: 60 capsule, Rfl: 1 .  fluocinonide (LIDEX) 0.05 % external solution, APP EXT AA BID, Disp: , Rfl: 0 .  gabapentin (NEURONTIN) 300  MG capsule, Take 1 capsule (300 mg total) by mouth 3 (three) times daily., Disp: 270 capsule, Rfl: 1 .  levothyroxine (SYNTHROID, LEVOTHROID) 88 MCG tablet, Take 1 tablet (88 mcg total) by mouth daily before breakfast., Disp: 90 tablet, Rfl: 1 .  Magnesium Oxide 400 MG CAPS, Take 1 capsule (400 mg total) by mouth once., Disp: 30 capsule, Rfl: 5 .  montelukast (SINGULAIR) 10 MG tablet, TAKE 1 TABLET(10 MG) BY MOUTH DAILY, Disp: 90 tablet, Rfl: 1 .  naproxen (NAPROSYN) 500 MG tablet, Take 1 tablet by mouth 2 (two) times daily., Disp: , Rfl: 1 .  omeprazole (PRILOSEC) 40 MG capsule, Take 1 capsule (40 mg total) by mouth daily., Disp: 90 capsule, Rfl: 1 .  ondansetron (ZOFRAN) 4 MG  tablet, Take 1 tablet (4 mg total) by mouth every 8 (eight) hours as needed for nausea or vomiting., Disp: 10 tablet, Rfl: 0 .  polyethylene glycol powder (GLYCOLAX/MIRALAX) powder, Take 17 g by mouth 2 (two) times daily as needed., Disp: 3350 g, Rfl: 1 .  Semaglutide (OZEMPIC) 0.25 or 0.5 MG/DOSE SOPN, Inject 0.5 mg into the skin once a week., Disp: 1 pen, Rfl: 3 .  tiZANidine (ZANAFLEX) 4 MG tablet, Take 1 tablet by mouth every 8 (eight) hours as needed for muscle spasms., Disp: , Rfl: 0 .  traZODone (DESYREL) 50 MG tablet, Take 0.5-1 tablets (25-50 mg total) by mouth at bedtime., Disp: 30 tablet, Rfl: 1 .  amoxicillin-clavulanate (AUGMENTIN) 875-125 MG tablet, Take 1 tablet by mouth 2 (two) times daily. (Patient not taking: Reported on 08/27/2017), Disp: 14 tablet, Rfl: 0  No Known Allergies  ROS  Constitutional: Negative for fever or weight change.  Respiratory: Negative for cough and shortness of breath.   Cardiovascular: Negative for chest pain or palpitations.  Gastrointestinal: Negative for abdominal pain, no bowel changes.  Musculoskeletal: Negative for gait problem or joint swelling. See HPI Skin: Negative for rash.  Neurological: Negative for dizziness or headache.  No other specific complaints in a complete review of systems (except as listed in HPI above).  Objective  Vitals:   08/27/17 1007  BP: 110/72  Pulse: 98  Resp: 16  Temp: 98.3 F (36.8 C)  TempSrc: Oral  SpO2: 97%  Weight: 174 lb 4.8 oz (79.1 kg)  Height: 4\' 9"  (1.448 m)   Body mass index is 37.72 kg/m.  Nursing Note and Vital Signs reviewed.  Physical Exam Constitutional: Patient appears well-developed and well-nourished. Obese No distress.  HEENT: head atraumatic, normocephalic Cardiovascular: Normal rate, regular rhythm, S1/S2 present.  No murmur or rub heard. No BLE edema. Pulmonary/Chest: Effort normal and breath sounds clear. No respiratory distress or retractions. Psychiatric: Patient has a  normal mood and affect. behavior is normal. Judgment and thought content normal. Musculoskeletal: Normal range of motion, no joint effusions. No gross deformities. Calves and feet are non-tender, no erythema, no swelling.  Pedal pulses +2 bilaterally. Neurological: she is alert and oriented to person, place, and time. No cranial nerve deficit. Coordination, balance, strength, speech and gait are normal.  Skin: Skin is warm and dry. No rash noted. No erythema.   No results found for this or any previous visit (from the past 72 hour(s)).  Assessment & Plan  1. Muscle spasm of calf - BASIC METABOLIC PANEL WITH GFR - Magnesium - tiZANidine (ZANAFLEX) 4 MG tablet; Take 0.5-1 tablets (2-4 mg total) by mouth every 8 (eight) hours as needed for muscle spasms.  Dispense: 30 tablet; Refill: 0  2.  Foot spasms - BASIC METABOLIC PANEL WITH GFR - Magnesium - tiZANidine (ZANAFLEX) 4 MG tablet; Take 0.5-1 tablets (2-4 mg total) by mouth every 8 (eight) hours as needed for muscle spasms.  Dispense: 30 tablet; Refill: 0  - Gentle stretching discussed in detail with handout provided.  -Red flags and when to present for emergency care or RTC including fever >101.34F, chest pain, shortness of breath, new/worsening/un-resolving symptoms, reviewed with patient at time of visit. Follow up and care instructions discussed and provided in AVS.

## 2017-08-27 NOTE — Telephone Encounter (Signed)
Pt in office for evaluation 08/27/17.

## 2017-09-09 ENCOUNTER — Other Ambulatory Visit: Payer: Self-pay | Admitting: Family Medicine

## 2017-09-09 DIAGNOSIS — B001 Herpesviral vesicular dermatitis: Secondary | ICD-10-CM

## 2017-09-09 NOTE — Telephone Encounter (Signed)
Refill request for general medication: Acyclovir to [facility].   Last office visit: 07/29/17   Follow up on 10/28/2017

## 2017-09-16 ENCOUNTER — Encounter: Payer: Self-pay | Admitting: Psychiatry

## 2017-09-16 ENCOUNTER — Ambulatory Visit: Payer: BLUE CROSS/BLUE SHIELD | Admitting: Psychiatry

## 2017-09-16 ENCOUNTER — Other Ambulatory Visit: Payer: Self-pay

## 2017-09-16 ENCOUNTER — Telehealth: Payer: Self-pay | Admitting: Family Medicine

## 2017-09-16 VITALS — BP 112/76 | HR 92 | Temp 98.1°F | Wt 187.0 lb

## 2017-09-16 DIAGNOSIS — F5105 Insomnia due to other mental disorder: Secondary | ICD-10-CM

## 2017-09-16 DIAGNOSIS — F331 Major depressive disorder, recurrent, moderate: Secondary | ICD-10-CM | POA: Diagnosis not present

## 2017-09-16 DIAGNOSIS — M62831 Muscle spasm of calf: Secondary | ICD-10-CM

## 2017-09-16 DIAGNOSIS — R252 Cramp and spasm: Secondary | ICD-10-CM

## 2017-09-16 MED ORDER — DULOXETINE HCL 30 MG PO CPEP: 60.0000 mg | ORAL_CAPSULE | Freq: Every day | ORAL | 1 refills | Status: DC

## 2017-09-16 MED ORDER — BUPROPION HCL ER (XL) 150 MG PO TB24: 150.0000 mg | ORAL_TABLET | Freq: Every day | ORAL | 1 refills | Status: DC

## 2017-09-16 MED ORDER — TRAZODONE HCL 100 MG PO TABS: 100.0000 mg | ORAL_TABLET | Freq: Every day | ORAL | 1 refills | Status: DC

## 2017-09-16 NOTE — Progress Notes (Signed)
[facility] MD OP Progress Note  09/16/2017 10:48 AM Sara Andrews  MRN:  [id number]  Chief Complaint: ' I am not sleeping." Chief Complaint    Follow-up; Medication Refill     HPI: Sara Andrews is a 62 year old Caucasian female who is on SSI, lives in [place], widowed, has a history of MDD, multiple medical issues including DDD, chronic pain, diabetes mellitus, GERD, COPD, presented today for a follow-up visit.  Patient today reports that her current medications are working together to help with her mood symptoms.  She reports her mood as improved.  She reports she is tolerating the Wellbutrin and Cymbalta but well.  And effects.  She continues to struggle with sleep.  She reports she struggles with muscle spasms as well as chronic pain issues.  She reports her doctor provided her a muscle relaxant prescription recently.  She reports that it does not seem to be working.  She reports she continues to struggle with the muscle spasms usually at night and her sleep hence is disrupted.  She reports she had a good Valentine's Day with her children and grandchildren.  She continues to enjoy her children.  She reports she also has a new addition to her family ,a new puppy.  She showed Clinical research associate the picture of the new puppy.     She denies any other concerns today.  Visit Diagnosis:    ICD-10-CM   1. MDD (major depressive disorder), recurrent episode, moderate (HCC) F33.1 buPROPion (WELLBUTRIN XL) 150 MG 24 hr tablet    DULoxetine (CYMBALTA) 30 MG capsule  2. Insomnia due to mental disorder F51.05 traZODone (DESYREL) 100 MG tablet    Past Psychiatric History: Depression after the death of her husband in 2006/12/21.  Denies history of suicide attempts or suicidal ideations.  Denies IP admissions.  Her PMD was managing her depressive symptoms in the past.  Past trials of Zoloft (did not work), Wellbutrin (worked).  Past Medical History:  Past Medical History:  Diagnosis Date  . Anxiety   . Arthritis     "hips, hands, back" (03/10/2016)  . Asthma   . Chronic bronchitis (HCC)   . COPD (chronic obstructive pulmonary disease) (HCC)   . Cough   . Depression   . Esophagitis, reflux   . GERD (gastroesophageal reflux disease)   . Hyperlipidemia   . Hypothyroidism   . IBS (irritable bowel syndrome)   . Lumbago   . Muscle pain   . Osteoarthritis   . Sinus disorder   . Thyroid disease   . Type 2 diabetes, diet controlled (HCC)   . Uncomplicated herpes simplex     Past Surgical History:  Procedure Laterality Date  . ABDOMINAL HYSTERECTOMY    . CARPAL TUNNEL RELEASE Right   . ORIF TIBIA PLATEAU Left 03/10/2016   Procedure: OPEN REDUCTION INTERNAL FIXATION (ORIF) BICONDYLAR TIBIAL PLATEAU FRACTURE;  Surgeon: Eldred Manges, MD;  Location: [facility] OR;  Service: Orthopedics;  Laterality: Left;  . SHOULDER ARTHROSCOPY W/ ROTATOR CUFF REPAIR Right   . TONSILLECTOMY AND ADENOIDECTOMY      Family Psychiatric History:Daughter -Mental health issues, sister-suicidal ideation, drug abuse.  Family History:  Family History  Problem Relation Age of Onset  . Hypertension Daughter   . Diabetes Father   . Heart disease Father   . Breast cancer Maternal Aunt 60  . Breast cancer Paternal Aunt 48  . Depression Sister   . Alcohol abuse Sister   . Anxiety disorder Sister   . Bipolar disorder Sister  Substance abuse history: Denies  Social History: Raised by her father, her mother left her when she was 1 years old.  She is currently widowed after 35 years of marriage.  Has 3 daughters, 2 of them are very supportive and the youngest has her own issues and patient has a rocky relationship with her.  She has a lot of grandkids and enjoys taking care of them.  She supports herself with SSI as well as Worker's Comp. Social History   Socioeconomic History  . Marital status: Widowed    Spouse name: None  . Number of children: 3  . Years of education: None  . Highest education level: 10th grade  Social Needs   . Financial resource strain: Not hard at all  . Food insecurity - worry: Never true  . Food insecurity - inability: Never true  . Transportation needs - medical: No  . Transportation needs - non-medical: No  Occupational History    Comment: retired  Tobacco Use  . Smoking status: Former Smoker    Packs/day: 2.00    Years: 30.00    Pack years: 60.00    Last attempt to quit: 09/03/2008    Years since quitting: 9.0  . Smokeless tobacco: Never Used  Substance and Sexual Activity  . Alcohol use: No  . Drug use: No  . Sexual activity: No  Other Topics Concern  . None  Social History Narrative   She used to work at  [employer], but had a left knee work related injury 03/10/2016 , require left knee surgery and is now having back pain, that is getting evaluated, Out of work since.     Allergies: No Known Allergies  Metabolic Disorder Labs: Lab Results  Component Value Date   HGBA1C 5.6 07/29/2017   MPG 114 07/29/2017   No results found for: PROLACTIN Lab Results  Component Value Date   CHOL 159 12/28/2015   TRIG 96 01/21/2017   HDL 48 01/21/2017   CHOLHDL 2.7 12/28/2015   LDLCALC 56 01/21/2017   LDLCALC 87 12/28/2015   Lab Results  Component Value Date   TSH 2.01 07/29/2017   TSH 0.95 01/21/2017    Therapeutic Level Labs: No results found for: LITHIUM No results found for: VALPROATE No components found for:  CBMZ  Current Medications: Current Outpatient Medications  Medication Sig Dispense Refill  . acyclovir (ZOVIRAX) 400 MG tablet TAKE 1 TABLET(400 MG) BY MOUTH DAILY 30 tablet 0  . albuterol (PROVENTIL) (2.5 MG/3ML) 0.083% nebulizer solution Take 3 mLs (2.5 mg total) by nebulization every 6 (six) hours as needed for wheezing or shortness of breath. 75 mL 2  . amoxicillin-clavulanate (AUGMENTIN) 875-125 MG tablet Take 1 tablet by mouth 2 (two) times daily. 14 tablet 0  . aspirin 81 MG EC tablet TK 1 T PO QD  0  . atorvastatin (LIPITOR) 40 MG tablet Take 1 tablet  (40 mg total) by mouth daily. In place of simvastatin 90 tablet 1  . BEVESPI AEROSPHERE 9-4.8 MCG/ACT AERO Take 1 puff by mouth daily.  5  . buPROPion (WELLBUTRIN XL) 150 MG 24 hr tablet Take 1 tablet (150 mg total) by mouth daily. 30 tablet 1  . Cholecalciferol (VITAMIN D PO) Take 1 capsule by mouth daily.    . diclofenac sodium (VOLTAREN) 1 % GEL Apply 4 g topically 2 (two) times daily as needed. 2 Tube 1  . DULoxetine (CYMBALTA) 30 MG capsule Take 2 capsules (60 mg total) by mouth daily. 60 capsule 1  .  fluocinonide (LIDEX) 0.05 % external solution APP EXT AA BID  0  . gabapentin (NEURONTIN) 300 MG capsule Take 1 capsule (300 mg total) by mouth 3 (three) times daily. 270 capsule 1  . levothyroxine (SYNTHROID, LEVOTHROID) 88 MCG tablet Take 1 tablet (88 mcg total) by mouth daily before breakfast. 90 tablet 1  . Magnesium Oxide 400 MG CAPS Take 1 capsule (400 mg total) by mouth once. 30 capsule 5  . montelukast (SINGULAIR) 10 MG tablet TAKE 1 TABLET(10 MG) BY MOUTH DAILY 90 tablet 1  . naproxen (NAPROSYN) 500 MG tablet Take 1 tablet by mouth 2 (two) times daily.  1  . omeprazole (PRILOSEC) 40 MG capsule Take 1 capsule (40 mg total) by mouth daily. 90 capsule 1  . ondansetron (ZOFRAN) 4 MG tablet Take 1 tablet (4 mg total) by mouth every 8 (eight) hours as needed for nausea or vomiting. 10 tablet 0  . polyethylene glycol powder (GLYCOLAX/MIRALAX) powder Take 17 g by mouth 2 (two) times daily as needed. 3350 g 1  . Semaglutide (OZEMPIC) 0.25 or 0.5 MG/DOSE SOPN Inject 0.5 mg into the skin once a week. 1 pen 3  . tiZANidine (ZANAFLEX) 4 MG tablet Take 0.5-1 tablets (2-4 mg total) by mouth every 8 (eight) hours as needed for muscle spasms. 20 tablet 0  . traZODone (DESYREL) 100 MG tablet Take 1-1.5 tablets (100-150 mg total) by mouth at bedtime. 45 tablet 1   No current facility-administered medications for this visit.      Musculoskeletal: Strength & Muscle Tone: within normal limits Gait &  Station: normal Patient leans: N/A  Psychiatric Specialty Exam: Review of Systems  Psychiatric/Behavioral: The patient has insomnia.   All other systems reviewed and are negative.   Blood pressure 112/76, pulse 92, temperature 98.1 F (36.7 C), temperature source Oral, weight 187 lb (84.8 kg).Body mass index is 40.47 kg/m.  General Appearance: Casual  Eye Contact:  Fair  Speech:  Clear and Coherent  Volume:  Normal  Mood:  Dysphoric improving  Affect:  Appropriate  Thought Process:  Goal Directed and Descriptions of Associations: Intact  Orientation:  Full (Time, Place, and Person)  Thought Content: Logical   Suicidal Thoughts:  No  Homicidal Thoughts:  No  Memory:  Immediate;   Fair Recent;   Fair Remote;   Fair  Judgement:  Fair  Insight:  Fair  Psychomotor Activity:  Normal  Concentration:  Concentration: Fair and Attention Span: Fair  Recall:  [employer] of Knowledge: Fair  Language: Fair  Akathisia:  No  Handed:  Right  AIMS (if indicated): 0  Assets:  Communication Skills Desire for Improvement Housing Social Support  ADL's:  Intact  Cognition: WNL  Sleep:  restless   Screenings: PHQ2-9     Office Visit from 04/28/2017 in [facility] Office Visit from 12/04/2016 in [facility] Office Visit from 08/07/2016 in [facility] Office Visit from 02/01/2016 in [facility] Office Visit from 04/30/2015 in [facility]  PHQ-2 Total Score  4  4  0  0  0  PHQ-9 Total Score  21  12  No data  No data  No data       Assessment and Plan: Sara Andrews is a 62 year old Caucasian female who is widowed, has a history of mental illness as well as medical issues, chronic pain, presented to the clinic today for a follow-up visit.  Sara Andrews continues to struggle with chronic  pain issues which is also disrupting her sleep.  Discussed medication plan as noted below.  For depression Cymbalta 60 mg  p.o. daily Wellbutrin XL 150 mg p.o. daily to augment her Cymbalta.  Insomnia Increase trazodone to 100-150 mg p.o. nightly as needed Discussed sleep hygiene. She will also reach out to her pain provider regarding her muscle spasms as well as pain.  Discussed with her to continue CBT with Ms. Peacock however she can discuss with Ms. Peacock about gradually tapering her therapy appointments down.  Follow-up in clinic in 2 months or sooner if needed.  More than 50 % of the time was spent for psychoeducation and supportive psychotherapy and care coordination.  This note was generated in part or whole with voice recognition software. Voice recognition is usually quite accurate but there are transcription errors that can and very often do occur. I apologize for any typographical errors that were not detected and corrected.       Jomarie LongsSaramma Neelam Tiggs, MD 09/16/2017, 10:48 AM

## 2017-09-16 NOTE — Telephone Encounter (Signed)
I am placing an order to check a CBC and ferritin level - low ferritin can indicate restless leg syndrome.  Please advise she come in for labs, and we will determine follow up based on these results.  Drink plenty of water, continue to perform stretches and exercises as discussed in our last appointment.  If her pain is severe she needs to seek emergency care.

## 2017-09-16 NOTE — Telephone Encounter (Signed)
Copied from CRM [phone]. Topic: Quick Communication - See Telephone Encounter >> Sep 16, 2017 11:03 AM Oneal GroutSebastian, Jennifer S wrote: CRM for notification. See Telephone encounter for: Carloyn MannerSaw Emily Boyce on 08/27/17, was prescribed tiZANidine (ZANAFLEX) 4 MG tablet for muscle cramps, not helping patient at all. Please advise  09/16/17.

## 2017-09-17 NOTE — Telephone Encounter (Signed)
Patient notified, will becoming for labs on Friday

## 2017-09-18 LAB — CBC
HEMATOCRIT: 39.7 % (ref 35.0–45.0)
HEMOGLOBIN: 13.4 g/dL (ref 11.7–15.5)
MCH: 29.8 pg (ref 27.0–33.0)
MCHC: 33.8 g/dL (ref 32.0–36.0)
MCV: 88.4 fL (ref 80.0–100.0)
MPV: 9.7 fL (ref 7.5–12.5)
Platelets: 240 10*3/uL (ref 140–400)
RBC: 4.49 10*6/uL (ref 3.80–5.10)
RDW: 12.2 % (ref 11.0–15.0)
WBC: 5 10*3/uL (ref 3.8–10.8)

## 2017-09-18 LAB — IRON,TIBC AND FERRITIN PANEL
%SAT: 27 % (calc) (ref 11–50)
Ferritin: 75 ng/mL (ref 20–288)
IRON: 85 ug/dL (ref 45–160)
TIBC: 317 mcg/dL (calc) (ref 250–450)

## 2017-09-21 ENCOUNTER — Telehealth: Payer: Self-pay | Admitting: Family Medicine

## 2017-09-21 NOTE — Telephone Encounter (Signed)
Copied from CRM [id number]. Topic: Quick Communication - Lab Results >> Sep 21, 2017  2:20 PM Sara Andrews, [name] wrote: Patient called and would like to talk to someone about her lab results. She said that no one has called about them. Please call patient back, thanks.

## 2017-09-21 NOTE — Telephone Encounter (Signed)
Patient notified

## 2017-09-23 ENCOUNTER — Ambulatory Visit: Payer: BLUE CROSS/BLUE SHIELD | Admitting: Family Medicine

## 2017-09-23 ENCOUNTER — Encounter: Payer: Self-pay | Admitting: Family Medicine

## 2017-09-23 VITALS — BP 118/70 | HR 90 | Temp 97.4°F | Resp 16 | Ht <= 58 in | Wt 173.6 lb

## 2017-09-23 DIAGNOSIS — R51 Headache: Secondary | ICD-10-CM

## 2017-09-23 DIAGNOSIS — R252 Cramp and spasm: Secondary | ICD-10-CM

## 2017-09-23 DIAGNOSIS — M48062 Spinal stenosis, lumbar region with neurogenic claudication: Secondary | ICD-10-CM

## 2017-09-23 DIAGNOSIS — R519 Headache, unspecified: Secondary | ICD-10-CM

## 2017-09-23 MED ORDER — ROPINIROLE HCL 0.5 MG PO TABS: 0.5000 mg | ORAL_TABLET | Freq: Every day | ORAL | 0 refills | Status: DC

## 2017-09-23 NOTE — Progress Notes (Signed)
Name: Sara Andrews   MRN: [id number]    DOB: 09/17/1955   Date:09/23/2017       Progress Note  Subjective  Chief Complaint  Chief Complaint  Patient presents with  . Leg Pain    Patient is having nerves burned in her back next week due pain that radiates down both thighs. Back Pain that has been going on for 1 year.    HPI  Leg cramp/spinal stenosis/headaches: she is [facility] , Dr. Retia Passe and is going to have a procedure done March 6th. She states that over the past month she has noticed severe leg cramps at night that wakes her up from sleep. She states she can sleep well, she is also noticing a dull headache every day in the afternoons, pain is 4/10 but can get up to 7/10 as the day progresses. Started at the same time as leg cramps and lack of sleep at night. No focal deficit, she has episodes of nausea with headaches, no photophobia or phonophobia. Multiple labs done and normal results.    Patient Active Problem List   Diagnosis Date Noted  . Spondylosis of lumbar spine 11/06/2016  . Spinal stenosis of lumbar region with neurogenic claudication 10/28/2016  . Chronic bilateral low back pain with bilateral sciatica 09/16/2016  . Hyperglycemia 08/07/2016  . Moderate episode of recurrent major depressive disorder (HCC) 08/07/2016  . Pain of left heel 06/02/2016  . Closed fracture of lateral portion of left tibial plateau 03/10/2016  . Degenerative cervical disc 03/04/2016  . Gastroesophageal reflux disease without esophagitis 02/01/2016  . Primary osteoarthritis of right hand 02/01/2016  . Paresthesias in left hand 02/01/2016  . SOB (shortness of breath) 10/05/2013  . COPD (chronic obstructive pulmonary disease) (HCC) 10/05/2013  . History of smoking 10/05/2013  . Hyperlipidemia 10/05/2013  . Hypothyroidism 10/05/2013    Past Surgical History:  Procedure Laterality Date  . ABDOMINAL HYSTERECTOMY    . CARPAL TUNNEL RELEASE Right   . ORIF TIBIA  PLATEAU Left 03/10/2016   Procedure: OPEN REDUCTION INTERNAL FIXATION (ORIF) BICONDYLAR TIBIAL PLATEAU FRACTURE;  Surgeon: Eldred Manges, MD;  Location: [facility] OR;  Service: Orthopedics;  Laterality: Left;  . SHOULDER ARTHROSCOPY W/ ROTATOR CUFF REPAIR Right   . TONSILLECTOMY AND ADENOIDECTOMY      Family History  Problem Relation Age of Onset  . Hypertension Daughter   . Diabetes Father   . Heart disease Father   . Breast cancer Maternal Aunt 64  . Breast cancer Paternal Aunt 64  . Depression Sister   . Alcohol abuse Sister   . Anxiety disorder Sister   . Bipolar disorder Sister     Social History   Socioeconomic History  . Marital status: Widowed    Spouse name: Not on file  . Number of children: 3  . Years of education: Not on file  . Highest education level: 10th grade  Social Needs  . Financial resource strain: Not hard at all  . Food insecurity - worry: Never true  . Food insecurity - inability: Never true  . Transportation needs - medical: No  . Transportation needs - non-medical: No  Occupational History    Comment: retired  Tobacco Use  . Smoking status: Former Smoker    Packs/day: 2.00    Years: 30.00    Pack years: 60.00    Last attempt to quit: 09/03/2008    Years since quitting: 9.0  . Smokeless tobacco: Never Used  Substance  and Sexual Activity  . Alcohol use: No  . Drug use: No  . Sexual activity: No  Other Topics Concern  . Not on file  Social History Narrative   She used to work at  [employer], but had a left knee work related injury 03/10/2016 , require left knee surgery and is now having back pain, that is getting evaluated, Out of work since.      Current Outpatient Medications:  .  acyclovir (ZOVIRAX) 400 MG tablet, TAKE 1 TABLET(400 MG) BY MOUTH DAILY, Disp: 30 tablet, Rfl: 0 .  albuterol (PROVENTIL) (2.5 MG/3ML) 0.083% nebulizer solution, Take 3 mLs (2.5 mg total) by nebulization every 6 (six) hours as needed for wheezing or shortness of  breath., Disp: 75 mL, Rfl: 2 .  aspirin 81 MG EC tablet, TK 1 T PO QD, Disp: , Rfl: 0 .  atorvastatin (LIPITOR) 40 MG tablet, Take 1 tablet (40 mg total) by mouth daily. In place of simvastatin, Disp: 90 tablet, Rfl: 1 .  BEVESPI AEROSPHERE 9-4.8 MCG/ACT AERO, Take 1 puff by mouth daily., Disp: , Rfl: 5 .  buPROPion (WELLBUTRIN XL) 150 MG 24 hr tablet, Take 1 tablet (150 mg total) by mouth daily., Disp: 30 tablet, Rfl: 1 .  Cholecalciferol (VITAMIN D PO), Take 1 capsule by mouth daily., Disp: , Rfl:  .  diclofenac sodium (VOLTAREN) 1 % GEL, Apply 4 g topically 2 (two) times daily as needed., Disp: 2 Tube, Rfl: 1 .  DULoxetine (CYMBALTA) 30 MG capsule, Take 2 capsules (60 mg total) by mouth daily., Disp: 60 capsule, Rfl: 1 .  fluocinonide (LIDEX) 0.05 % external solution, APP EXT AA BID, Disp: , Rfl: 0 .  gabapentin (NEURONTIN) 300 MG capsule, Take 1 capsule (300 mg total) by mouth 3 (three) times daily., Disp: 270 capsule, Rfl: 1 .  levothyroxine (SYNTHROID, LEVOTHROID) 88 MCG tablet, Take 1 tablet (88 mcg total) by mouth daily before breakfast., Disp: 90 tablet, Rfl: 1 .  Magnesium Oxide 400 MG CAPS, Take 1 capsule (400 mg total) by mouth once., Disp: 30 capsule, Rfl: 5 .  montelukast (SINGULAIR) 10 MG tablet, TAKE 1 TABLET(10 MG) BY MOUTH DAILY, Disp: 90 tablet, Rfl: 1 .  naproxen (NAPROSYN) 500 MG tablet, Take 1 tablet by mouth 2 (two) times daily., Disp: , Rfl: 1 .  omeprazole (PRILOSEC) 40 MG capsule, Take 1 capsule (40 mg total) by mouth daily., Disp: 90 capsule, Rfl: 1 .  ondansetron (ZOFRAN) 4 MG tablet, Take 1 tablet (4 mg total) by mouth every 8 (eight) hours as needed for nausea or vomiting., Disp: 10 tablet, Rfl: 0 .  polyethylene glycol powder (GLYCOLAX/MIRALAX) powder, Take 17 g by mouth 2 (two) times daily as needed., Disp: 3350 g, Rfl: 1 .  Semaglutide (OZEMPIC) 0.25 or 0.5 MG/DOSE SOPN, Inject 0.5 mg into the skin once a week., Disp: 1 pen, Rfl: 3 .  traZODone (DESYREL) 100 MG  tablet, Take 1-1.5 tablets (100-150 mg total) by mouth at bedtime., Disp: 45 tablet, Rfl: 1  No Known Allergies   ROS  Ten systems reviewed and is negative except as mentioned in HPI   Objective  Vitals:   09/23/17 1212  BP: 118/70  Pulse: 90  Resp: 16  Temp: (!) 97.4 F (36.3 C)  TempSrc: Oral  SpO2: 97%  Weight: 173 lb 10.1 oz (78.8 kg)  Height: 4\' 9"  (1.448 m)    Body mass index is 37.57 kg/m.  Physical Exam  Constitutional: Patient appears well-developed and well-nourished.  Obese  No distress.  HEENT: head atraumatic, normocephalic, pupils equal and reactive to light,  neck supple, throat within normal limits Cardiovascular: Normal rate, regular rhythm and normal heart sounds.  No murmur heard. No BLE edema. Pulmonary/Chest: Effort normal and breath sounds normal. No respiratory distress. Abdominal: Soft.  There is no tenderness. Psychiatric: Patient has a normal mood and affect. behavior is normal. Judgment and thought content normal. Neurological : normal cranial nerves, normal upper extremity strength, normal gait, pain with rom of her back - worse with extension and lateral bending   Recent Results (from the past 2160 hour(s))  TSH     Status: None   Collection Time: 07/29/17  9:10 AM  Result Value Ref Range   TSH 2.01 0.40 - 4.50 mIU/L  Hemoglobin A1c     Status: None   Collection Time: 07/29/17  9:10 AM  Result Value Ref Range   Hgb A1c MFr Bld 5.6 <5.7 % of total Hgb    Comment: For the purpose of screening for the presence of diabetes: . <5.7%       Consistent with the absence of diabetes 5.7-6.4%    Consistent with increased risk for diabetes             (prediabetes) > or =6.5%  Consistent with diabetes . This assay result is consistent with a decreased risk of diabetes. . Currently, no consensus exists regarding use of hemoglobin A1c for diagnosis of diabetes in children. . According to American Diabetes Association (ADA) guidelines,  hemoglobin A1c <7.0% represents optimal control in non-pregnant diabetic patients. Different metrics may apply to specific patient populations.  Standards of Medical Care in Diabetes(ADA). .    Mean Plasma Glucose 114 (calc)   eAG (mmol/L) 6.3 (calc)  BASIC METABOLIC PANEL WITH GFR     Status: None   Collection Time: 08/27/17 11:26 AM  Result Value Ref Range   Glucose, Bld 91 65 - 99 mg/dL    Comment: .            Fasting reference interval .    BUN 18 7 - 25 mg/dL   Creat 4.09 8.11 - 9.14 mg/dL    Comment: For patients >27 years of age, the reference limit for Creatinine is approximately 13% higher for people identified as African-American. .    GFR, Est Non African American 72 > OR = 60 mL/min/1.65m2   GFR, Est African American 83 > OR = 60 mL/min/1.35m2   BUN/Creatinine Ratio NOT APPLICABLE 6 - 22 (calc)   Sodium 138 135 - 146 mmol/L   Potassium 4.2 3.5 - 5.3 mmol/L   Chloride 104 98 - 110 mmol/L   CO2 28 20 - 32 mmol/L   Calcium 9.5 8.6 - 10.4 mg/dL  Magnesium     Status: None   Collection Time: 08/27/17 11:26 AM  Result Value Ref Range   Magnesium 2.2 1.5 - 2.5 mg/dL  CBC     Status: None   Collection Time: 09/18/17  9:04 AM  Result Value Ref Range   WBC 5.0 3.8 - 10.8 Thousand/uL   RBC 4.49 3.80 - 5.10 Million/uL   Hemoglobin 13.4 11.7 - 15.5 g/dL   HCT 78.2 95.6 - 21.3 %   MCV 88.4 80.0 - 100.0 fL   MCH 29.8 27.0 - 33.0 pg   MCHC 33.8 32.0 - 36.0 g/dL   RDW 08.6 57.8 - 46.9 %   Platelets 240 140 - 400 Thousand/uL   MPV 9.7 7.5 -  12.5 fL  Iron, TIBC and Ferritin Panel     Status: None   Collection Time: 09/18/17  9:04 AM  Result Value Ref Range   Iron 85 45 - 160 mcg/dL   TIBC 416317 606250 - 301450 mcg/dL (calc)   %SAT 27 11 - 50 % (calc)   Ferritin 75 20 - 288 ng/mL     PHQ2/9: [facility] 04/28/2017 12/04/2016 08/07/2016 02/01/2016 04/30/2015  Decreased Interest 3 1 0 0 0  Down, Depressed, Hopeless 1 3 0 0 0  PHQ - 2 Score 4 4 0 0 0  Altered  sleeping 2 3 - - -  Tired, decreased energy 3 2 - - -  Change in appetite 3 0 - - -  Feeling bad or failure about yourself  3 0 - - -  Trouble concentrating 3 1 - - -  Moving slowly or fidgety/restless 3 2 - - -  Suicidal thoughts 0 0 - - -  PHQ-9 Score 21 12 - - -  Difficult doing work/chores Somewhat difficult Very difficult - - -     Fall Risk: Fall Risk  09/23/2017 07/29/2017 07/03/2017 04/28/2017 12/04/2016  Falls in the past year? No No No No Yes  Number falls in past yr: - - - - 1  Injury with Fall? - - - - Yes  Comment - - - - Fracture her left knee  Follow up - - - - -     Assessment & Plan  1. Leg cramps  Explained it may be from neurogenic claudication and since she will have radiofrequency ablation of nerve fibers, I would hold off on further therapy.   - rOPINIRole (REQUIP) 0.5 MG tablet; Take 1 tablet (0.5 mg total) by mouth at bedtime.  Dispense: 30 tablet; Refill: 0  2. Spinal stenosis of lumbar region with neurogenic claudication  Seeing Dr. Retia PasseSaullo and we will obtain records  3. Headache disorder  Dull aching at the end of the day, advised hydration, she is not sleeping well because of pain and may resolve once pain is under control

## 2017-10-17 ENCOUNTER — Other Ambulatory Visit: Payer: Self-pay | Admitting: Family Medicine

## 2017-10-28 ENCOUNTER — Ambulatory Visit: Payer: BLUE CROSS/BLUE SHIELD | Admitting: Family Medicine

## 2017-10-28 ENCOUNTER — Encounter: Payer: Self-pay | Admitting: Family Medicine

## 2017-10-28 VITALS — BP 104/76 | HR 106 | Resp 16 | Ht <= 58 in | Wt 170.5 lb

## 2017-10-28 DIAGNOSIS — F331 Major depressive disorder, recurrent, moderate: Secondary | ICD-10-CM | POA: Diagnosis not present

## 2017-10-28 DIAGNOSIS — E1169 Type 2 diabetes mellitus with other specified complication: Secondary | ICD-10-CM | POA: Diagnosis not present

## 2017-10-28 DIAGNOSIS — J449 Chronic obstructive pulmonary disease, unspecified: Secondary | ICD-10-CM | POA: Diagnosis not present

## 2017-10-28 DIAGNOSIS — J3089 Other allergic rhinitis: Secondary | ICD-10-CM

## 2017-10-28 DIAGNOSIS — E038 Other specified hypothyroidism: Secondary | ICD-10-CM | POA: Diagnosis not present

## 2017-10-28 DIAGNOSIS — E669 Obesity, unspecified: Secondary | ICD-10-CM

## 2017-10-28 DIAGNOSIS — L309 Dermatitis, unspecified: Secondary | ICD-10-CM | POA: Diagnosis not present

## 2017-10-28 DIAGNOSIS — E785 Hyperlipidemia, unspecified: Secondary | ICD-10-CM | POA: Diagnosis not present

## 2017-10-28 DIAGNOSIS — M48062 Spinal stenosis, lumbar region with neurogenic claudication: Secondary | ICD-10-CM

## 2017-10-28 DIAGNOSIS — K219 Gastro-esophageal reflux disease without esophagitis: Secondary | ICD-10-CM | POA: Diagnosis not present

## 2017-10-28 LAB — POCT GLYCOSYLATED HEMOGLOBIN (HGB A1C): Hemoglobin A1C: 5.8

## 2017-10-28 MED ORDER — OMEPRAZOLE 40 MG PO CPDR: 40.0000 mg | DELAYED_RELEASE_CAPSULE | Freq: Every day | ORAL | 1 refills | Status: DC

## 2017-10-28 MED ORDER — FLUOCINOLONE ACETONIDE 0.01 % EX CREA: TOPICAL_CREAM | Freq: Two times a day (BID) | CUTANEOUS | 0 refills | Status: DC

## 2017-10-28 MED ORDER — SEMAGLUTIDE(0.25 OR 0.5MG/DOS) 2 MG/1.5ML ~~LOC~~ SOPN: 0.5000 mg | PEN_INJECTOR | SUBCUTANEOUS | 3 refills | Status: DC

## 2017-10-28 MED ORDER — MONTELUKAST SODIUM 10 MG PO TABS: ORAL_TABLET | ORAL | 1 refills | Status: DC

## 2017-10-28 MED ORDER — UMECLIDINIUM-VILANTEROL 62.5-25 MCG/INH IN AEPB: 1.0000 | INHALATION_SPRAY | Freq: Every day | RESPIRATORY_TRACT | 5 refills | Status: DC

## 2017-10-28 NOTE — Progress Notes (Signed)
Name: Sara Andrews   MRN: [id number]    DOB: [birth year]   Date:10/28/2017       Progress Note  Subjective  Chief Complaint  Chief Complaint  Patient presents with  . Diabetes  . Hyperlipidemia    HPI  DDD andspondylolisthesis: off  Gabapentin, Naproxen and Baclofen since back procedure a couple of weeks ago. She states symptoms were very well controlled for one week, but after that started to noticed some burning and pain she was given some Lyrica instead but did not work,she has a follow up tomorrow with the pain clinic.    COPD: she does not recall taking Bevespi, we will try changing to Anoro/ She quit smoking in 2009. She has chronic cough that is occasionally productive and worse in am's or when eating, last flare was Dec 2018 and had to take steroids for it. Back to baseline now.   GERD: she is off Omeprazole last visit, however noticed more heartburn since started GLP-1 agonist. and is back on Omeprazole daily, discussed long term risk of medication  Hypothyroidism: reviewed labs and TSH done 07/2017 at goal, continue current dose, she states constipation is stable, we still don't have results of Cologuard, we will contact company - may need to repeat study. Taking miralax prn now  DM II : diagnosed at work back in 2012she used to take medications, last hgbA1C was 6.2%, down to 6.0 down to 5.8%, elevated fasting insulin. She denies polyphagia, polyuria , she has polydipsia.  Started on Trulicity June 2018, having some constipation and indigestion, we switched to Ozempic 04/2017 and is down 15 lbs since. Needs eye exam. She has obesity and dyslipidemia and is on statin therapy   Major Depression: she states she has been taking medication, she felt better when the pain improved, seeing Dr. Elna Breslow  Dermatitis: long history of cracked hands worse around her fingers, using lotion without help, we will try topical medication  Patient Active Problem List   Diagnosis Date  Noted  . Spondylosis of lumbar spine 11/06/2016  . Spinal stenosis of lumbar region with neurogenic claudication 10/28/2016  . Chronic bilateral low back pain with bilateral sciatica 09/16/2016  . Hyperglycemia 08/07/2016  . Moderate episode of recurrent major depressive disorder (HCC) 08/07/2016  . Pain of left heel 06/02/2016  . Closed fracture of lateral portion of left tibial plateau 03/10/2016  . Degenerative cervical disc 03/04/2016  . Gastroesophageal reflux disease without esophagitis 02/01/2016  . Primary osteoarthritis of right hand 02/01/2016  . Paresthesias in left hand 02/01/2016  . SOB (shortness of breath) 10/05/2013  . COPD (chronic obstructive pulmonary disease) (HCC) 10/05/2013  . History of smoking 10/05/2013  . Hyperlipidemia 10/05/2013  . Hypothyroidism 10/05/2013    Past Surgical History:  Procedure Laterality Date  . ABDOMINAL HYSTERECTOMY    . CARPAL TUNNEL RELEASE Right   . ORIF TIBIA PLATEAU Left 03/10/2016   Procedure: OPEN REDUCTION INTERNAL FIXATION (ORIF) BICONDYLAR TIBIAL PLATEAU FRACTURE;  Surgeon: Eldred Manges, MD;  Location: [facility] OR;  Service: Orthopedics;  Laterality: Left;  . SHOULDER ARTHROSCOPY W/ ROTATOR CUFF REPAIR Right   . TONSILLECTOMY AND ADENOIDECTOMY      Family History  Problem Relation Age of Onset  . Hypertension Daughter   . Diabetes Father   . Heart disease Father   . Breast cancer Maternal Aunt 60  . Breast cancer Paternal Aunt 46  . Depression Sister   . Alcohol abuse Sister   . Anxiety disorder Sister   .  Bipolar disorder Sister     Social History   Socioeconomic History  . Marital status: Widowed    Spouse name: Not on file  . Number of children: 3  . Years of education: Not on file  . Highest education level: 10th grade  Occupational History    Comment: retired  Engineer, production  . Financial resource strain: Not hard at all  . Food insecurity:    Worry: Never true    Inability: Never true  . Transportation  needs:    Medical: No    Non-medical: No  Tobacco Use  . Smoking status: Former Smoker    Packs/day: 2.00    Years: 30.00    Pack years: 60.00    Last attempt to quit: 09/03/2008    Years since quitting: 9.1  . Smokeless tobacco: Never Used  Substance and Sexual Activity  . Alcohol use: No  . Drug use: No  . Sexual activity: Never  Lifestyle  . Physical activity:    Days per week: 0 days    Minutes per session: 0 min  . Stress: Not at all  Relationships  . Social connections:    Talks on phone: Twice a week    Gets together: Never    Attends religious service: More than 4 times per year    Active member of club or organization: No    Attends meetings of clubs or organizations: Never    Relationship status: Widowed  . Intimate partner violence:    Fear of current or ex partner: No    Emotionally abused: No    Physically abused: No    Forced sexual activity: No  Other Topics Concern  . Not on file  Social History Narrative   She used to work at  [employer], but had a left knee work related injury 03/10/2016 , require left knee surgery and is now having back pain, that is getting evaluated, Out of work since.      Current Outpatient Medications:  .  acyclovir (ZOVIRAX) 400 MG tablet, TAKE 1 TABLET(400 MG) BY MOUTH DAILY, Disp: 30 tablet, Rfl: 0 .  albuterol (PROVENTIL) (2.5 MG/3ML) 0.083% nebulizer solution, Take 3 mLs (2.5 mg total) by nebulization every 6 (six) hours as needed for wheezing or shortness of breath., Disp: 75 mL, Rfl: 2 .  atorvastatin (LIPITOR) 40 MG tablet, Take 1 tablet (40 mg total) by mouth daily. In place of simvastatin, Disp: 90 tablet, Rfl: 1 .  buPROPion (WELLBUTRIN XL) 150 MG 24 hr tablet, Take 1 tablet (150 mg total) by mouth daily., Disp: 30 tablet, Rfl: 1 .  Cholecalciferol (VITAMIN D PO), Take 1 capsule by mouth daily., Disp: , Rfl:  .  diclofenac sodium (VOLTAREN) 1 % GEL, Apply 4 g topically 2 (two) times daily as needed., Disp: 2 Tube, Rfl:  1 .  DULoxetine (CYMBALTA) 30 MG capsule, Take 2 capsules (60 mg total) by mouth daily., Disp: 60 capsule, Rfl: 1 .  levothyroxine (SYNTHROID, LEVOTHROID) 88 MCG tablet, Take 1 tablet (88 mcg total) by mouth daily before breakfast., Disp: 90 tablet, Rfl: 1 .  Magnesium Oxide 400 MG CAPS, Take 1 capsule (400 mg total) by mouth once., Disp: 30 capsule, Rfl: 5 .  ondansetron (ZOFRAN) 4 MG tablet, Take 1 tablet (4 mg total) by mouth every 8 (eight) hours as needed for nausea or vomiting., Disp: 10 tablet, Rfl: 0 .  polyethylene glycol powder (GLYCOLAX/MIRALAX) powder, Take 17 g by mouth 2 (two) times daily as needed.,  Disp: 3350 g, Rfl: 1 .  traZODone (DESYREL) 100 MG tablet, Take 1-1.5 tablets (100-150 mg total) by mouth at bedtime., Disp: 45 tablet, Rfl: 1 .  fluocinolone (VANOS) 0.01 % cream, Apply topically 2 (two) times daily., Disp: 60 g, Rfl: 0 .  fluocinonide (LIDEX) 0.05 % external solution, APP EXT AA BID, Disp: , Rfl: 0 .  montelukast (SINGULAIR) 10 MG tablet, TAKE 1 TABLET(10 MG) BY MOUTH DAILY, Disp: 90 tablet, Rfl: 1 .  omeprazole (PRILOSEC) 40 MG capsule, Take 1 capsule (40 mg total) by mouth daily., Disp: 90 capsule, Rfl: 1 .  Semaglutide (OZEMPIC) 0.25 or 0.5 MG/DOSE SOPN, Inject 0.5 mg into the skin once a week., Disp: 1 pen, Rfl: 3 .  umeclidinium-vilanterol (ANORO ELLIPTA) 62.5-25 MCG/INH AEPB, Inhale 1 puff into the lungs daily., Disp: 60 each, Rfl: 5  No Known Allergies   ROS  Constitutional: Negative for fever , positive for mild weight change.  Respiratory: positive  for intermittent cough and shortness of breath.   Cardiovascular: Negative for chest pain or palpitations.  Gastrointestinal: Negative for abdominal pain, no bowel changes.  Musculoskeletal: Negative for gait problem or joint swelling.  Skin: Negative for rash.   Neurological: Negative for dizziness or headache.  No other specific complaints in a complete review of systems (except as listed in HPI  above).  Objective  Vitals:   10/28/17 0834  BP: 104/76  Pulse: (!) 106  Resp: 16  SpO2: 95%  Weight: 170 lb 8 oz (77.3 kg)  Height: 4\' 9"  (1.448 m)    Body mass index is 36.9 kg/m.  Physical Exam  Constitutional: Patient appears well-developed and well-nourished. Obese No distress.  HEENT: head atraumatic, normocephalic, pupils equal and reactive to light,  neck supple, throat within normal limits Cardiovascular: Normal rate, regular rhythm and normal heart sounds.  No murmur heard. No BLE edema. Pulmonary/Chest: Effort normal and breath sounds normal. No respiratory distress. Abdominal: Soft.  There is no tenderness. Skin: dermatitis both hands, on finger tips, cracking.  Psychiatric: Patient has a normal mood and affect. behavior is normal. Judgment and thought content normal.  Recent Results (from the past 2160 hour(s))  BASIC METABOLIC PANEL WITH GFR     Status: None   Collection Time: 08/27/17 11:26 AM  Result Value Ref Range   Glucose, Bld 91 65 - 99 mg/dL    Comment: .            Fasting reference interval .    BUN 18 7 - 25 mg/dL   Creat 1.61 0.96 - 0.45 mg/dL    Comment: For patients >58 years of age, the reference limit for Creatinine is approximately 13% higher for people identified as African-American. .    GFR, Est Non African American 72 > OR = 60 mL/min/1.58m2   GFR, Est African American 83 > OR = 60 mL/min/1.67m2   BUN/Creatinine Ratio NOT APPLICABLE 6 - 22 (calc)   Sodium 138 135 - 146 mmol/L   Potassium 4.2 3.5 - 5.3 mmol/L   Chloride 104 98 - 110 mmol/L   CO2 28 20 - 32 mmol/L   Calcium 9.5 8.6 - 10.4 mg/dL  Magnesium     Status: None   Collection Time: 08/27/17 11:26 AM  Result Value Ref Range   Magnesium 2.2 1.5 - 2.5 mg/dL  CBC     Status: None   Collection Time: 09/18/17  9:04 AM  Result Value Ref Range   WBC 5.0 3.8 - 10.8 Thousand/uL  RBC 4.49 3.80 - 5.10 Million/uL   Hemoglobin 13.4 11.7 - 15.5 g/dL   HCT 95.639.7 21.335.0 - 08.645.0 %    MCV 88.4 80.0 - 100.0 fL   MCH 29.8 27.0 - 33.0 pg   MCHC 33.8 32.0 - 36.0 g/dL   RDW 57.812.2 46.911.0 - 62.915.0 %   Platelets 240 140 - 400 Thousand/uL   MPV 9.7 7.5 - 12.5 fL  Iron, TIBC and Ferritin Panel     Status: None   Collection Time: 09/18/17  9:04 AM  Result Value Ref Range   Iron 85 45 - 160 mcg/dL   TIBC 528317 413250 - 244450 mcg/dL (calc)   %SAT 27 11 - 50 % (calc)   Ferritin 75 20 - 288 ng/mL  POCT HgB A1C     Status: Abnormal   Collection Time: 10/28/17  8:54 AM  Result Value Ref Range   Hemoglobin A1C 5.8     Diabetic Foot Exam: Diabetic Foot Exam - Simple   Simple Foot Form Diabetic Foot exam was performed with the following findings:  Yes 10/28/2017  9:09 AM  Visual Inspection No deformities, no ulcerations, no other skin breakdown bilaterally:  Yes Sensation Testing Intact to touch and monofilament testing bilaterally:  Yes Pulse Check Posterior Tibialis and Dorsalis pulse intact bilaterally:  Yes Comments      PHQ2/9: [facility] screen [facility] 10/28/2017 04/28/2017 12/04/2016 08/07/2016 02/01/2016  Decreased Interest 2 3 1  0 0  Down, Depressed, Hopeless 1 1 3  0 0  PHQ - 2 Score 3 4 4  0 0  Altered sleeping 1 2 3  - -  Tired, decreased energy 1 3 2  - -  Change in appetite 0 3 0 - -  Feeling bad or failure about yourself  0 3 0 - -  Trouble concentrating 0 3 1 - -  Moving slowly or fidgety/restless 1 3 2  - -  Suicidal thoughts 0 0 0 - -  PHQ-9 Score 6 21 12  - -  Difficult doing work/chores Not difficult at all Somewhat difficult Very difficult - -    Fall Risk: Fall Risk  10/28/2017 09/23/2017 07/29/2017 07/03/2017 04/28/2017  Falls in the past year? No No No No No  Number falls in past yr: - - - - -  Injury with Fall? - - - - -  Comment - - - - -  Follow up - - - - -    Functional Status Survey: Is the patient deaf or have difficulty hearing?: No Does the patient have difficulty seeing, even when wearing glasses/contacts?: No Does the patient have difficulty  concentrating, remembering, or making decisions?: No Does the patient have difficulty walking or climbing stairs?: Yes Does the patient have difficulty dressing or bathing?: No Does the patient have difficulty doing errands alone such as visiting a doctor's office or shopping?: No    Assessment & Plan  1. Dyslipidemia associated with type 2 diabetes mellitus (HCC)  - POCT HgB A1C  2. Spinal stenosis of lumbar region with neurogenic claudication  Seeing Dr. Retia PasseSaullo   3. Dyslipidemia  On statin therapy   4. Other specified hypothyroidism  Last TSH at goal, continue medication   5. COPD  (HCC)  stable - umeclidinium-vilanterol (ANORO ELLIPTA) 62.5-25 MCG/INH AEPB; Inhale 1 puff into the lungs daily.  Dispense: 60 each; Refill: 5  6. GERD without esophagitis  A little worsening of symptoms with Ozempic, explained to eat smaller portions - omeprazole (PRILOSEC) 40 MG capsule; Take 1 capsule (  40 mg total) by mouth daily.  Dispense: 90 capsule; Refill: 1  7. Moderate episode of recurrent major depressive disorder (HCC)  Seeing Dr. Elna Breslow , doing better today, still taking medications   8. Morbid obesity (HCC)  Currently on Ozempic, eating smaller portions and seems to be doing well  9. Other allergic rhinitis  - montelukast (SINGULAIR) 10 MG tablet; TAKE 1 TABLET(10 MG) BY MOUTH DAILY  Dispense: 90 tablet; Refill: 1  10. Diabetes mellitus type 2 in obese (HCC)  - Semaglutide (OZEMPIC) 0.25 or 0.5 MG/DOSE SOPN; Inject 0.5 mg into the skin once a week.  Dispense: 1 pen; Refill: 3  11. Hand dermatitis  - fluocinolone (VANOS) 0.01 % cream; Apply topically 2 (two) times daily.  Dispense: 60 g; Refill: 0

## 2017-11-01 ENCOUNTER — Other Ambulatory Visit: Payer: Self-pay | Admitting: Family Medicine

## 2017-11-01 DIAGNOSIS — J3089 Other allergic rhinitis: Secondary | ICD-10-CM

## 2017-11-11 ENCOUNTER — Ambulatory Visit (INDEPENDENT_AMBULATORY_CARE_PROVIDER_SITE_OTHER): Payer: BLUE CROSS/BLUE SHIELD | Admitting: Licensed Clinical Social Worker

## 2017-11-11 ENCOUNTER — Ambulatory Visit: Payer: BLUE CROSS/BLUE SHIELD | Admitting: Psychiatry

## 2017-11-11 ENCOUNTER — Encounter: Payer: Self-pay | Admitting: Psychiatry

## 2017-11-11 ENCOUNTER — Other Ambulatory Visit: Payer: Self-pay

## 2017-11-11 ENCOUNTER — Other Ambulatory Visit: Payer: Self-pay | Admitting: Psychiatry

## 2017-11-11 VITALS — BP 104/71 | HR 85 | Temp 98.0°F | Ht 59.0 in | Wt 170.6 lb

## 2017-11-11 DIAGNOSIS — F331 Major depressive disorder, recurrent, moderate: Secondary | ICD-10-CM | POA: Diagnosis not present

## 2017-11-11 DIAGNOSIS — F5105 Insomnia due to other mental disorder: Secondary | ICD-10-CM

## 2017-11-11 MED ORDER — DULOXETINE HCL 30 MG PO CPEP
60.0000 mg | ORAL_CAPSULE | Freq: Every day | ORAL | 3 refills | Status: DC
Start: 2017-11-11 — End: 2018-01-18

## 2017-11-11 MED ORDER — BUPROPION HCL ER (XL) 150 MG PO TB24: 150.0000 mg | ORAL_TABLET | Freq: Every day | ORAL | 3 refills | Status: DC

## 2017-11-11 MED ORDER — TRAZODONE HCL 100 MG PO TABS: 100.0000 mg | ORAL_TABLET | Freq: Every day | ORAL | 3 refills | Status: DC

## 2017-11-11 NOTE — Progress Notes (Signed)
[facility] MD OP Progress Note  11/11/2017 1:09 PM Sara Andrews  MRN:  [id number]  Chief Complaint: ' I am here for follow up." Chief Complaint    Follow-up; Medication Refill     HPI: Sara Andrews is a 62 year old Caucasian female who is on SSI, lives in [place], widowed, has a history of MDD, multiple medical problems including DDD, chronic pain, diabetes mellitus, GERD, COPD, presented today for a follow-up visit.  Today reports she has been doing well on the current medications.  She reports her mood symptoms as fair.  She denies any mood lability or irritability.  She denies any side effects to the Wellbutrin and Cymbalta.  Patient reports sleep is improved on the trazodone.  She reports she does wake up to use the restroom once or twice however is able to fall back asleep.  She continues to struggle with pain as well as muscle spasms which can interrupt her sleep at times.  She is currently working with her pain provider.  She reports she had injections as well as is trying to get back braces soon.  Patient reports she enjoys her grandchildren.  She is looking forward to Easter holiday.  She continues to be in psychotherapy with Ms. Peacock and it is going well.  Patient continues to deny any substance abuse problems. Visit Diagnosis:    ICD-10-CM   1. MDD (major depressive disorder), recurrent episode, moderate (HCC) F33.1 buPROPion (WELLBUTRIN XL) 150 MG 24 hr tablet    DULoxetine (CYMBALTA) 30 MG capsule  2. Insomnia due to mental disorder F51.05 traZODone (DESYREL) 100 MG tablet    Past Psychiatric History: Depression after the death of her husband in 2007-01-02.  Denies history of suicide attempts or suicidal ideation.  Denies inpatient admissions.  Her PMD was managing her depressive symptoms in the past.  Past trials of Zoloft (did not work), Wellbutrin (worked).  Past Medical History:  Past Medical History:  Diagnosis Date  . Anxiety   . Arthritis    "hips, hands, back"  (03/10/2016)  . Asthma   . Chronic bronchitis (HCC)   . COPD (chronic obstructive pulmonary disease) (HCC)   . Cough   . Depression   . Esophagitis, reflux   . GERD (gastroesophageal reflux disease)   . Hyperlipidemia   . Hypothyroidism   . IBS (irritable bowel syndrome)   . Lumbago   . Muscle pain   . Osteoarthritis   . Sinus disorder   . Thyroid disease   . Type 2 diabetes, diet controlled (HCC)   . Uncomplicated herpes simplex     Past Surgical History:  Procedure Laterality Date  . ABDOMINAL HYSTERECTOMY    . CARPAL TUNNEL RELEASE Right   . ORIF TIBIA PLATEAU Left 03/10/2016   Procedure: OPEN REDUCTION INTERNAL FIXATION (ORIF) BICONDYLAR TIBIAL PLATEAU FRACTURE;  Surgeon: Eldred Manges, MD;  Location: [facility] OR;  Service: Orthopedics;  Laterality: Left;  . SHOULDER ARTHROSCOPY W/ ROTATOR CUFF REPAIR Right   . TONSILLECTOMY AND ADENOIDECTOMY      Family Psychiatric History: Daughter-mental health issues, sister-suicidal ideation, drug abuse.  Family History:  Family History  Problem Relation Age of Onset  . Hypertension Daughter   . Diabetes Father   . Heart disease Father   . Breast cancer Maternal Aunt 60  . Breast cancer Paternal Aunt 16  . Depression Sister   . Alcohol abuse Sister   . Anxiety disorder Sister   . Bipolar disorder Sister    Substance abuse history:  Denies  Social History: Raised by her father, her mother left her when she was 36 years old.  She is currently widowed after 35 years of marriage.  Has 3 daughters, 2 of them are very supportive and the youngest has her own issues.  Patient has a rocky relationship with her.  She has a lot of grandkids and enjoys taking care of them.  She supports herself with SSI as well as workers comp. Social History   Socioeconomic History  . Marital status: Widowed    Spouse name: Not on file  . Number of children: 3  . Years of education: Not on file  . Highest education level: 10th grade  Occupational History     Comment: retired  Engineer, production  . Financial resource strain: Not hard at all  . Food insecurity:    Worry: Never true    Inability: Never true  . Transportation needs:    Medical: No    Non-medical: No  Tobacco Use  . Smoking status: Former Smoker    Packs/day: 2.00    Years: 30.00    Pack years: 60.00    Last attempt to quit: 09/03/2008    Years since quitting: 9.1  . Smokeless tobacco: Never Used  Substance and Sexual Activity  . Alcohol use: No  . Drug use: No  . Sexual activity: Never  Lifestyle  . Physical activity:    Days per week: 0 days    Minutes per session: 0 min  . Stress: Not at all  Relationships  . Social connections:    Talks on phone: Twice a week    Gets together: Never    Attends religious service: More than 4 times per year    Active member of club or organization: No    Attends meetings of clubs or organizations: Never    Relationship status: Widowed  Other Topics Concern  . Not on file  Social History Narrative   She used to work at  [employer], but had a left knee work related injury 03/10/2016 , require left knee surgery and is now having back pain, that is getting evaluated, Out of work since.     Allergies: No Known Allergies  Metabolic Disorder Labs: Lab Results  Component Value Date   HGBA1C 5.8 10/28/2017   MPG 114 07/29/2017   No results found for: PROLACTIN Lab Results  Component Value Date   CHOL 159 12/28/2015   TRIG 96 01/21/2017   HDL 48 01/21/2017   CHOLHDL 2.7 12/28/2015   LDLCALC 56 01/21/2017   LDLCALC 87 12/28/2015   Lab Results  Component Value Date   TSH 2.01 07/29/2017   TSH 0.95 01/21/2017    Therapeutic Level Labs: No results found for: LITHIUM No results found for: VALPROATE No components found for:  CBMZ  Current Medications: Current Outpatient Medications  Medication Sig Dispense Refill  . acyclovir (ZOVIRAX) 400 MG tablet TAKE 1 TABLET(400 MG) BY MOUTH DAILY 30 tablet 0  . albuterol (PROVENTIL)  (2.5 MG/3ML) 0.083% nebulizer solution Take 3 mLs (2.5 mg total) by nebulization every 6 (six) hours as needed for wheezing or shortness of breath. 75 mL 2  . atorvastatin (LIPITOR) 40 MG tablet Take 1 tablet (40 mg total) by mouth daily. In place of simvastatin 90 tablet 1  . buPROPion (WELLBUTRIN XL) 150 MG 24 hr tablet Take 1 tablet (150 mg total) by mouth daily. 30 tablet 3  . Cholecalciferol (VITAMIN D PO) Take 1 capsule by mouth daily.    Marland Kitchen  diclofenac sodium (VOLTAREN) 1 % GEL Apply 4 g topically 2 (two) times daily as needed. 2 Tube 1  . DULoxetine (CYMBALTA) 30 MG capsule Take 2 capsules (60 mg total) by mouth daily. 60 capsule 3  . fluocinolone (VANOS) 0.01 % cream Apply topically 2 (two) times daily. 60 g 0  . fluocinonide (LIDEX) 0.05 % external solution APP EXT AA BID  0  . levothyroxine (SYNTHROID, LEVOTHROID) 88 MCG tablet Take 1 tablet (88 mcg total) by mouth daily before breakfast. 90 tablet 1  . Magnesium Oxide 400 MG CAPS Take 1 capsule (400 mg total) by mouth once. 30 capsule 5  . montelukast (SINGULAIR) 10 MG tablet TAKE 1 TABLET(10 MG) BY MOUTH DAILY 90 tablet 0  . omeprazole (PRILOSEC) 40 MG capsule Take 1 capsule (40 mg total) by mouth daily. 90 capsule 1  . ondansetron (ZOFRAN) 4 MG tablet Take 1 tablet (4 mg total) by mouth every 8 (eight) hours as needed for nausea or vomiting. 10 tablet 0  . polyethylene glycol powder (GLYCOLAX/MIRALAX) powder Take 17 g by mouth 2 (two) times daily as needed. 3350 g 1  . Semaglutide (OZEMPIC) 0.25 or 0.5 MG/DOSE SOPN Inject 0.5 mg into the skin once a week. 1 pen 3  . traZODone (DESYREL) 100 MG tablet Take 1-1.5 tablets (100-150 mg total) by mouth at bedtime. 45 tablet 3  . umeclidinium-vilanterol (ANORO ELLIPTA) 62.5-25 MCG/INH AEPB Inhale 1 puff into the lungs daily. 60 each 5   No current facility-administered medications for this visit.      Musculoskeletal: Strength & Muscle Tone: within normal limits Gait & Station:  normal Patient leans: N/A  Psychiatric Specialty Exam: Review of Systems  Psychiatric/Behavioral: Positive for depression (improved). The patient is nervous/anxious (improved) and has insomnia (improved).   All other systems reviewed and are negative.   Blood pressure 104/71, pulse 85, temperature 98 F (36.7 C), temperature source Oral, height 4\' 11"  (1.499 m), weight 170 lb 9.6 oz (77.4 kg).Body mass index is 34.46 kg/m.  General Appearance: Casual  Eye Contact:  Fair  Speech:  Normal Rate  Volume:  Normal  Mood:  Dysphoric  Affect:  Congruent  Thought Process:  Goal Directed and Descriptions of Associations: Intact  Orientation:  Full (Time, Place, and Person)  Thought Content: Logical   Suicidal Thoughts:  No  Homicidal Thoughts:  No  Memory:  Immediate;   Fair Recent;   Fair Remote;   Fair  Judgement:  Fair  Insight:  Fair  Psychomotor Activity:  Normal  Concentration:  Concentration: Fair and Attention Span: Fair  Recall:  FiservFair  Fund of Knowledge: Fair  Language: Fair  Akathisia:  No  Handed:  Right  AIMS (if indicated): na  Assets:  Communication Skills Desire for Improvement Housing Social Support Transportation  ADL's:  Intact  Cognition: WNL  Sleep:  improving   Screenings: PHQ2-9     [facility]  PHQ-2 Total Score  3  4  4   0  0  PHQ-9 Total Score  6  21  12   -  -       Assessment and Plan: [name] is a 62 year old Caucasian female who is widowed, has a history of mental illness as well as medical  issues including chronic pain.  Patient today is tolerating her medications well.  She continues to struggle with chronic pain which is being managed by her pain  provider.  Discussed plan as noted below.  Plan For depression Cymbalta 60 mg p.o. daily Wellbutrin XL 150 mg p.o. daily.   Insomnia Continue trazodone 100-150 mg p.o. nightly as needed Discussed sleep hygiene. Patient will continue to follow-up with pain provider for her chronic pain issues.  She will continue psychotherapy with Ms. Peacock.  Follow-up in clinic in 3 months or sooner if needed.  More than 50 % of the time was spent for psychoeducation and supportive psychotherapy and care coordination.  This note was generated in part or whole with voice recognition software. Voice recognition is usually quite accurate but there are transcription errors that can and very often do occur. I apologize for any typographical errors that were not detected and corrected.      Jomarie Longs, MD 11/11/2017, 1:09 PM

## 2017-11-30 NOTE — Progress Notes (Signed)
   THERAPIST PROGRESS NOTE  Session Time: 1 hour  Participation Level: Active  Behavioral Response: CasualAlertEuthymic  Type of Therapy: Individual Therapy  Treatment Goals addressed: Coping  Interventions: Supportive  Summary: Sara Andrews is a 62 y.o. female who presents with continued symptoms of her diagnosis.  Therapist met with Patient in an initial therapy session to assess current mood and to build rapport. Therapist engaged Patient in discussion about her life and what is going well for her. Therapist provided support for Patient as she shared details about her life, her current stressors, mood, coping skills, her past, and her children. Therapist prompted Patient to discuss her support system and ways that she manages her daily stress, anger, and frustrations.  LCSW discussed what psychotherapy is and is not and the importance of the therapeutic relationship to include open and honest communication between client and therapist and building trust.  Reviewed advantages and disadvantages of the therapeutic process and limitations to the therapeutic relationship including LCSW's role in maintaining the safety of the client, others and those in client's care. Assisted Patient with treatment plan.    Suicidal/Homicidal: No   Plan: Return again in 2 weeks.  Diagnosis: Axis I: Depression    Axis II: No diagnosis    Lubertha South, LCSW 11/11/2017

## 2018-01-18 ENCOUNTER — Other Ambulatory Visit: Payer: Self-pay

## 2018-01-18 ENCOUNTER — Encounter: Payer: Self-pay | Admitting: Psychiatry

## 2018-01-18 ENCOUNTER — Ambulatory Visit: Payer: BLUE CROSS/BLUE SHIELD | Admitting: Psychiatry

## 2018-01-18 VITALS — BP 126/78 | Temp 98.1°F | Wt 162.8 lb

## 2018-01-18 DIAGNOSIS — F5105 Insomnia due to other mental disorder: Secondary | ICD-10-CM

## 2018-01-18 DIAGNOSIS — F331 Major depressive disorder, recurrent, moderate: Secondary | ICD-10-CM | POA: Diagnosis not present

## 2018-01-18 MED ORDER — BUPROPION HCL ER (XL) 150 MG PO TB24
150.0000 mg | ORAL_TABLET | Freq: Every day | ORAL | 1 refills | Status: DC
Start: 2018-01-18 — End: 2018-03-20

## 2018-01-18 MED ORDER — TRAZODONE HCL 100 MG PO TABS: 100.0000 mg | ORAL_TABLET | Freq: Every day | ORAL | 1 refills | Status: DC

## 2018-01-18 MED ORDER — DULOXETINE HCL 30 MG PO CPEP: 60.0000 mg | ORAL_CAPSULE | Freq: Every day | ORAL | 1 refills | Status: DC

## 2018-01-18 NOTE — Progress Notes (Signed)
BH MD  OP Progress Note  01/18/2018 1:44 PM Sara Andrews  MRN:  [id number]  Chief Complaint: ' I am here for follow up." Chief Complaint    Follow-up; Medication Refill     HPI: Sara Andrews is a 62 year old Caucasian female who is on SSI, lives in [place], widowed, has a history of MDD, multiple medical problems including DDD, chronic pain, diabetes mellitus, GERD, COPD, presented today for a follow-up visit.  Patient reports she continues to have some psychosocial stressors.  She reports her daughter recently got diagnosed with breast cancer.  She however reports its surgically resectable and she does not need any chemotherapy.  Patient however reports she is worried about her.  Patient has tried to reach out to her and offered her support.  Patient otherwise continues to have some relationship conflicts which has been ongoing for a long time with her family.  Patient is currently in psychotherapy with [name].  Patient reports she is compliant on her medications.  She reports she would like to stay on her medication as prescribed.  She denies any side effects.  She denies any suicidality.  Patient denies any perceptual disturbances.    Visit Diagnosis:    ICD-10-CM   1. MDD (major depressive disorder), recurrent episode, moderate (HCC) F33.1 buPROPion (WELLBUTRIN XL) 150 MG 24 hr tablet    DULoxetine (CYMBALTA) 30 MG capsule  2. Insomnia due to mental disorder F51.05 traZODone (DESYREL) 100 MG tablet    Past Psychiatric History: Reviewed past psychiatric history from my progress note on 11/11/2017.  Past trials of Zoloft-did not work, Wellbutrin-worked.  Past Medical History:  Past Medical History:  Diagnosis Date  . Anxiety   . Arthritis    "hips, hands, back" (03/10/2016)  . Asthma   . Chronic bronchitis (HCC)   . COPD (chronic obstructive pulmonary disease) (HCC)   . Cough   . Depression   . Esophagitis, reflux   . GERD (gastroesophageal reflux disease)   .  Hyperlipidemia   . Hypothyroidism   . IBS (irritable bowel syndrome)   . Lumbago   . Muscle pain   . Osteoarthritis   . Sinus disorder   . Thyroid disease   . Type 2 diabetes, diet controlled (HCC)   . Uncomplicated herpes simplex     Past Surgical History:  Procedure Laterality Date  . ABDOMINAL HYSTERECTOMY    . CARPAL TUNNEL RELEASE Right   . ORIF TIBIA PLATEAU Left 03/10/2016   Procedure: OPEN REDUCTION INTERNAL FIXATION (ORIF) BICONDYLAR TIBIAL PLATEAU FRACTURE;  Surgeon: Eldred Manges, MD;  Location: [facility] OR;  Service: Orthopedics;  Laterality: Left;  . SHOULDER ARTHROSCOPY W/ ROTATOR CUFF REPAIR Right   . TONSILLECTOMY AND ADENOIDECTOMY      Family Psychiatric History: Reviewed family psychiatric history from my progress note on 11/11/2017.  Family History:  Family History  Problem Relation Age of Onset  . Hypertension Daughter   . Diabetes Father   . Heart disease Father   . Breast cancer Maternal Aunt 60  . Breast cancer Paternal Aunt 30  . Depression Sister   . Alcohol abuse Sister   . Anxiety disorder Sister   . Bipolar disorder Sister   Substance abuse history: Denies   Social History: I have reviewed social history from my progress note on 11/11/2017. Social History   Socioeconomic History  . Marital status: Widowed    Spouse name: Not on file  . Number of children: 3  . Years of education:  Not on file  . Highest education level: 10th grade  Occupational History    Comment: retired  Engineer, production  . Financial resource strain: Not hard at all  . Food insecurity:    Worry: Never true    Inability: Never true  . Transportation needs:    Medical: No    Non-medical: No  Tobacco Use  . Smoking status: Former Smoker    Packs/day: 2.00    Years: 30.00    Pack years: 60.00    Last attempt to quit: 09/03/2008    Years since quitting: 9.3  . Smokeless tobacco: Never Used  Substance and Sexual Activity  . Alcohol use: No  . Drug use: No  . Sexual activity:  Never  Lifestyle  . Physical activity:    Days per week: 0 days    Minutes per session: 0 min  . Stress: Not at all  Relationships  . Social connections:    Talks on phone: Twice a week    Gets together: Never    Attends religious service: More than 4 times per year    Active member of club or organization: No    Attends meetings of clubs or organizations: Never    Relationship status: Widowed  Other Topics Concern  . Not on file  Social History Narrative   She used to work at  [employer], but had a left knee work related injury 03/10/2016 , require left knee surgery and is now having back pain, that is getting evaluated, Out of work since.     Allergies: No Known Allergies  Metabolic Disorder Labs: Lab Results  Component Value Date   HGBA1C 5.8 10/28/2017   MPG 114 07/29/2017   No results found for: PROLACTIN Lab Results  Component Value Date   CHOL 159 12/28/2015   TRIG 96 01/21/2017   HDL 48 01/21/2017   CHOLHDL 2.7 12/28/2015   LDLCALC 56 01/21/2017   LDLCALC 87 12/28/2015   Lab Results  Component Value Date   TSH 2.01 07/29/2017   TSH 0.95 01/21/2017    Therapeutic Level Labs: No results found for: LITHIUM No results found for: VALPROATE No components found for:  CBMZ  Current Medications: Current Outpatient Medications  Medication Sig Dispense Refill  . acyclovir (ZOVIRAX) 400 MG tablet TAKE 1 TABLET(400 MG) BY MOUTH DAILY 30 tablet 0  . albuterol (PROVENTIL) (2.5 MG/3ML) 0.083% nebulizer solution Take 3 mLs (2.5 mg total) by nebulization every 6 (six) hours as needed for wheezing or shortness of breath. 75 mL 2  . atorvastatin (LIPITOR) 40 MG tablet Take 1 tablet (40 mg total) by mouth daily. In place of simvastatin 90 tablet 1  . buPROPion (WELLBUTRIN XL) 150 MG 24 hr tablet Take 1 tablet (150 mg total) by mouth daily. 90 tablet 1  . Cholecalciferol (VITAMIN D PO) Take 1 capsule by mouth daily.    . diclofenac sodium (VOLTAREN) 1 % GEL Apply 4 g  topically 2 (two) times daily as needed. 2 Tube 1  . DULoxetine (CYMBALTA) 30 MG capsule Take 2 capsules (60 mg total) by mouth daily. 180 capsule 1  . fluocinolone (VANOS) 0.01 % cream Apply topically 2 (two) times daily. 60 g 0  . fluocinonide (LIDEX) 0.05 % external solution APP EXT AA BID  0  . levothyroxine (SYNTHROID, LEVOTHROID) 88 MCG tablet Take 1 tablet (88 mcg total) by mouth daily before breakfast. 90 tablet 1  . Magnesium Oxide 400 MG CAPS Take 1 capsule (400 mg total)  by mouth once. 30 capsule 5  . montelukast (SINGULAIR) 10 MG tablet TAKE 1 TABLET(10 MG) BY MOUTH DAILY 90 tablet 0  . omeprazole (PRILOSEC) 40 MG capsule Take 1 capsule (40 mg total) by mouth daily. 90 capsule 1  . ondansetron (ZOFRAN) 4 MG tablet Take 1 tablet (4 mg total) by mouth every 8 (eight) hours as needed for nausea or vomiting. 10 tablet 0  . polyethylene glycol powder (GLYCOLAX/MIRALAX) powder Take 17 g by mouth 2 (two) times daily as needed. 3350 g 1  . Semaglutide (OZEMPIC) 0.25 or 0.5 MG/DOSE SOPN Inject 0.5 mg into the skin once a week. 1 pen 3  . traZODone (DESYREL) 100 MG tablet Take 1-1.5 tablets (100-150 mg total) by mouth at bedtime. 135 tablet 1  . umeclidinium-vilanterol (ANORO ELLIPTA) 62.5-25 MCG/INH AEPB Inhale 1 puff into the lungs daily. 60 each 5   No current facility-administered medications for this visit.      Musculoskeletal: Strength & Muscle Tone: within normal limits Gait & Station: normal Patient leans: N/A  Psychiatric Specialty Exam: Review of Systems  Psychiatric/Behavioral: The patient is nervous/anxious.   All other systems reviewed and are negative.   Blood pressure 126/78, temperature 98.1 F (36.7 C), temperature source Oral, weight 162 lb 12.8 oz (73.8 kg).Body mass index is 32.88 kg/m.  General Appearance: Casual  Eye Contact:  Fair  Speech:  Normal Rate  Volume:  Normal  Mood:  Anxious  Affect:  Appropriate  Thought Process:  Goal Directed and  Descriptions of Associations: Intact  Orientation:  Full (Time, Place, and Person)  Thought Content: Logical   Suicidal Thoughts:  No  Homicidal Thoughts:  No  Memory:  Immediate;   Fair Recent;   Fair Remote;   Good  Judgement:  Fair  Insight:  Fair  Psychomotor Activity:  Normal  Concentration:  Concentration: Fair and Attention Span: Fair  Recall:  [employer] of Knowledge: Fair  Language: Fair  Akathisia:  No  Handed:  Right  AIMS (if indicated): na  Assets:  Communication Skills Desire for Improvement Social Support  ADL's:  Intact  Cognition: WNL  Sleep:  varies   Screenings: PHQ2-9     Office Visit from 10/28/2017 in [facility] Office Visit from 04/28/2017 in [facility] Office Visit from 12/04/2016 in [facility] Office Visit from 08/07/2016 in [facility] Office Visit from 02/01/2016 in [facility]  PHQ-2 Total Score  3  4  4   0  0  PHQ-9 Total Score  6  21  12   -  -       Assessment and Plan: Aleka is a 62 year old Caucasian female who is widowed, has a history of mental illness as well as medical issues including chronic pain, presented to the clinic today for a follow-up visit.  Patient is currently tolerating her medications well.  She however continues to struggle with some psychosocial stressors of chronic pain as well as relationship conflicts.  Patient will continue psychotherapy.  Plan as noted below.  Plan For depression Cymbalta 60 mg p.o. daily. Wellbutrin XL 150 mg p.o. daily.  For insomnia Continue trazodone 100-150 mg p.o. nightly as needed She will continue sleep hygiene techniques. Patient will also continue to follow-up with pain provider for her chronic pain problems which also makes her sleep restless.  Continue psychotherapy with [name].  Follow-up in clinic in 2 months.  More than 50 % of the  time was spent for psychoeducation and  supportive psychotherapy and care coordination.  This note was generated in part or whole with voice recognition software. Voice recognition is usually quite accurate but there are transcription errors that can and very often do occur. I apologize for any typographical errors that were not detected and corrected.       Jomarie LongsSaramma Jefry Lesinski, MD 01/18/2018, 1:44 PM

## 2018-01-26 ENCOUNTER — Other Ambulatory Visit: Payer: Self-pay | Admitting: Family Medicine

## 2018-01-26 DIAGNOSIS — E038 Other specified hypothyroidism: Secondary | ICD-10-CM

## 2018-01-26 DIAGNOSIS — E785 Hyperlipidemia, unspecified: Secondary | ICD-10-CM

## 2018-01-26 NOTE — Telephone Encounter (Signed)
Refill request for thyroid medication: Levothyroxine 88  Mcg  Last Physical: None indicated   Lab Results  Component Value Date   TSH 2.01 07/29/2017    Follow-ups on file. 02/03/2018  Refill Request for Cholesterol medication. Atorvastatin 40 mg  Last physical: None indicated  Lab Results  Component Value Date   CHOL 159 12/28/2015   HDL 48 01/21/2017   LDLCALC 56 01/21/2017   TRIG 96 01/21/2017   CHOLHDL 2.7 12/28/2015

## 2018-02-03 ENCOUNTER — Encounter: Payer: Self-pay | Admitting: Family Medicine

## 2018-02-03 ENCOUNTER — Ambulatory Visit: Payer: BLUE CROSS/BLUE SHIELD | Admitting: Family Medicine

## 2018-02-03 VITALS — BP 116/72 | HR 81 | Temp 97.7°F | Resp 16 | Ht 59.0 in | Wt 160.7 lb

## 2018-02-03 DIAGNOSIS — E785 Hyperlipidemia, unspecified: Secondary | ICD-10-CM

## 2018-02-03 DIAGNOSIS — Z1231 Encounter for screening mammogram for malignant neoplasm of breast: Secondary | ICD-10-CM

## 2018-02-03 DIAGNOSIS — J449 Chronic obstructive pulmonary disease, unspecified: Secondary | ICD-10-CM

## 2018-02-03 DIAGNOSIS — B379 Candidiasis, unspecified: Secondary | ICD-10-CM | POA: Diagnosis not present

## 2018-02-03 DIAGNOSIS — E038 Other specified hypothyroidism: Secondary | ICD-10-CM

## 2018-02-03 DIAGNOSIS — E1169 Type 2 diabetes mellitus with other specified complication: Secondary | ICD-10-CM

## 2018-02-03 DIAGNOSIS — E669 Obesity, unspecified: Secondary | ICD-10-CM

## 2018-02-03 DIAGNOSIS — Z1239 Encounter for other screening for malignant neoplasm of breast: Secondary | ICD-10-CM

## 2018-02-03 DIAGNOSIS — T3695XA Adverse effect of unspecified systemic antibiotic, initial encounter: Secondary | ICD-10-CM

## 2018-02-03 DIAGNOSIS — J3089 Other allergic rhinitis: Secondary | ICD-10-CM

## 2018-02-03 DIAGNOSIS — Z1211 Encounter for screening for malignant neoplasm of colon: Secondary | ICD-10-CM

## 2018-02-03 LAB — POCT UA - MICROALBUMIN: MICROALBUMIN (UR) POC: 20 mg/L

## 2018-02-03 LAB — POCT GLYCOSYLATED HEMOGLOBIN (HGB A1C): Hemoglobin A1C: 5.6 % (ref 4.0–5.6)

## 2018-02-03 MED ORDER — SEMAGLUTIDE(0.25 OR 0.5MG/DOS) 2 MG/1.5ML ~~LOC~~ SOPN: 0.5000 mg | PEN_INJECTOR | SUBCUTANEOUS | 3 refills | Status: DC

## 2018-02-03 MED ORDER — MONTELUKAST SODIUM 10 MG PO TABS: ORAL_TABLET | ORAL | 1 refills | Status: DC

## 2018-02-03 MED ORDER — ATORVASTATIN CALCIUM 40 MG PO TABS: 40.0000 mg | ORAL_TABLET | Freq: Every day | ORAL | 0 refills | Status: DC

## 2018-02-03 MED ORDER — FLUCONAZOLE 150 MG PO TABS: 150.0000 mg | ORAL_TABLET | ORAL | 0 refills | Status: DC

## 2018-02-03 MED ORDER — FLUTICASONE-UMECLIDIN-VILANT 100-62.5-25 MCG/INH IN AEPB: 1.0000 | INHALATION_SPRAY | Freq: Every day | RESPIRATORY_TRACT | 5 refills | Status: DC

## 2018-02-03 MED ORDER — ALBUTEROL SULFATE HFA 108 (90 BASE) MCG/ACT IN AERS: 2.0000 | INHALATION_SPRAY | Freq: Four times a day (QID) | RESPIRATORY_TRACT | 0 refills | Status: DC | PRN

## 2018-02-03 NOTE — Progress Notes (Signed)
Name: Sara Andrews   MRN: [id number]    DOB: 1955-10-07   Date:02/03/2018       Progress Note  Subjective  Chief Complaint  Chief Complaint  Patient presents with  . Medication Refill  . Diabetes  . Hypertension  . Gastroesophageal Reflux  . COPD  . Hypothyroidism  . Depression  . DDD andspondylolisthesis  . Vaginitis    HPI  DDD andspondylolisthesis: off  Gabapentin, Naproxen and Baclofen since injections this year.She states second round did not work well and they are considering surgery now. She states pain is constant and radiates down her legs   COPD: she has been taking Bevespi  But had another COPD flare that had to go to  Urgent Care 01/26/2018 and was given Prednisone taper, doxy and cough medication, she is still coughing, productive but getting better. She quit smoking in 2009. We will also switch from bevespi to Trelegy since had two flares in 6 months   GERD: she is off Omeprazole last visit, however noticed more heartburn since started GLP-1 agonist. and is back on Omeprazole daily, discussed long term risk of medication, unchanged   Hypothyroidism: reviewed labs and TSH done 07/2017 at goal, continue current dose,she states constipation is stable. No hair loss or constipation   DM II : diagnosed at work back in 2012she used to take medications, last hgbA1C was 6.2%, down to 6.0down to 5.8%, elevated fasting insulin.She denies polyphagia, polyuria , she has polydipsia.  Started on Trulicity June 2018, having some constipation and indigestion, we switched to Ozempic 04/2017 and is down 29 lbs since started on Ozempic Needs eye exam. She has obesity and dyslipidemia and is on statin therapy She is tolerating medication well and is due for repeat A1C  Major Depression: she states she has been taking medication, she felt better when the pain improved, seeing Dr. Elna Breslow. She is feeling better, new granddaughter born this past weekend, PHq9 has improved    Dermatitis: long history of cracked hands worse around her fingers, using lotion without help,  Doing well    Patient Active Problem List   Diagnosis Date Noted  . Spondylosis of lumbar spine 11/06/2016  . Spinal stenosis of lumbar region with neurogenic claudication 10/28/2016  . Chronic bilateral low back pain with bilateral sciatica 09/16/2016  . Hyperglycemia 08/07/2016  . Moderate episode of recurrent major depressive disorder (HCC) 08/07/2016  . Pain of left heel 06/02/2016  . Closed fracture of lateral portion of left tibial plateau 03/10/2016  . Degenerative cervical disc 03/04/2016  . Gastroesophageal reflux disease without esophagitis 02/01/2016  . Primary osteoarthritis of right hand 02/01/2016  . Paresthesias in left hand 02/01/2016  . SOB (shortness of breath) 10/05/2013  . COPD (chronic obstructive pulmonary disease) (HCC) 10/05/2013  . History of smoking 10/05/2013  . Hyperlipidemia 10/05/2013  . Hypothyroidism 10/05/2013    Past Surgical History:  Procedure Laterality Date  . ABDOMINAL HYSTERECTOMY    . CARPAL TUNNEL RELEASE Right   . ORIF TIBIA PLATEAU Left 03/10/2016   Procedure: OPEN REDUCTION INTERNAL FIXATION (ORIF) BICONDYLAR TIBIAL PLATEAU FRACTURE;  Surgeon: Eldred Manges, MD;  Location: [facility] OR;  Service: Orthopedics;  Laterality: Left;  . SHOULDER ARTHROSCOPY W/ ROTATOR CUFF REPAIR Right   . TONSILLECTOMY AND ADENOIDECTOMY      Family History  Problem Relation Age of Onset  . Hypertension Daughter   . Diabetes Father   . Heart disease Father   . Breast cancer Maternal Aunt 60  .  Breast cancer Paternal Aunt 73  . Depression Sister   . Alcohol abuse Sister   . Anxiety disorder Sister   . Bipolar disorder Sister     Social History   Socioeconomic History  . Marital status: Widowed    Spouse name: Not on file  . Number of children: 3  . Years of education: Not on file  . Highest education level: 10th grade  Occupational History    Comment:  retired  Engineer, production  . Financial resource strain: Not hard at all  . Food insecurity:    Worry: Never true    Inability: Never true  . Transportation needs:    Medical: No    Non-medical: No  Tobacco Use  . Smoking status: Former Smoker    Packs/day: 2.00    Years: 30.00    Pack years: 60.00    Last attempt to quit: 09/03/2008    Years since quitting: 9.4  . Smokeless tobacco: Never Used  Substance and Sexual Activity  . Alcohol use: No  . Drug use: No  . Sexual activity: Never  Lifestyle  . Physical activity:    Days per week: 0 days    Minutes per session: 0 min  . Stress: Not at all  Relationships  . Social connections:    Talks on phone: Twice a week    Gets together: Never    Attends religious service: More than 4 times per year    Active member of club or organization: No    Attends meetings of clubs or organizations: Never    Relationship status: Widowed  . Intimate partner violence:    Fear of current or ex partner: No    Emotionally abused: No    Physically abused: No    Forced sexual activity: No  Other Topics Concern  . Not on file  Social History Narrative   She used to work at  [employer], but had a left knee work related injury 03/10/2016 , require left knee surgery and is now having back pain, that is getting evaluated, Out of work since.       Patient recently found out that one of her twins [name]) was diagnosed with breast cancer); will be having a double mastectomy soon.     Current Outpatient Medications:  .  traMADol (ULTRAM) 50 MG tablet, Take 1 tablet by mouth 2 (two) times daily., Disp: , Rfl:  .  acyclovir (ZOVIRAX) 400 MG tablet, TAKE 1 TABLET(400 MG) BY MOUTH DAILY, Disp: 30 tablet, Rfl: 0 .  albuterol (PROAIR HFA) 108 (90 Base) MCG/ACT inhaler, Inhale 2 puffs into the lungs every 6 (six) hours as needed for wheezing or shortness of breath., Disp: 18 g, Rfl: 0 .  albuterol (PROVENTIL) (2.5 MG/3ML) 0.083% nebulizer solution, Take 3  mLs (2.5 mg total) by nebulization every 6 (six) hours as needed for wheezing or shortness of breath., Disp: 75 mL, Rfl: 2 .  atorvastatin (LIPITOR) 40 MG tablet, TAKE 1 TABLET BY MOUTH DAILY IN PLACE OF SIMVASTATIN, Disp: 90 tablet, Rfl: 0 .  buPROPion (WELLBUTRIN XL) 150 MG 24 hr tablet, Take 1 tablet (150 mg total) by mouth daily., Disp: 90 tablet, Rfl: 1 .  Cholecalciferol (VITAMIN D PO), Take 1 capsule by mouth daily., Disp: , Rfl:  .  diclofenac sodium (VOLTAREN) 1 % GEL, Apply 4 g topically 2 (two) times daily as needed., Disp: 2 Tube, Rfl: 1 .  DULoxetine (CYMBALTA) 30 MG capsule, Take 2 capsules (60 mg total)  by mouth daily., Disp: 180 capsule, Rfl: 1 .  fluconazole (DIFLUCAN) 150 MG tablet, Take 1 tablet (150 mg total) by mouth every other day., Disp: 3 tablet, Rfl: 0 .  fluocinolone (VANOS) 0.01 % cream, Apply topically 2 (two) times daily., Disp: 60 g, Rfl: 0 .  fluocinonide (LIDEX) 0.05 % external solution, APP EXT AA BID, Disp: , Rfl: 0 .  Fluticasone-Umeclidin-Vilant (TRELEGY ELLIPTA) 100-62.5-25 MCG/INH AEPB, Inhale 1 puff into the lungs daily., Disp: 60 each, Rfl: 5 .  levothyroxine (SYNTHROID, LEVOTHROID) 88 MCG tablet, TAKE 1 TABLET BY MOUTH DAILY BEFORE BREAKFAST, Disp: 90 tablet, Rfl: 0 .  Magnesium Oxide 400 MG CAPS, Take 1 capsule (400 mg total) by mouth once., Disp: 30 capsule, Rfl: 5 .  montelukast (SINGULAIR) 10 MG tablet, TAKE 1 TABLET(10 MG) BY MOUTH DAILY, Disp: 90 tablet, Rfl: 0 .  omeprazole (PRILOSEC) 40 MG capsule, Take 1 capsule (40 mg total) by mouth daily., Disp: 90 capsule, Rfl: 1 .  ondansetron (ZOFRAN) 4 MG tablet, Take 1 tablet (4 mg total) by mouth every 8 (eight) hours as needed for nausea or vomiting., Disp: 10 tablet, Rfl: 0 .  polyethylene glycol powder (GLYCOLAX/MIRALAX) powder, Take 17 g by mouth 2 (two) times daily as needed., Disp: 3350 g, Rfl: 1 .  Semaglutide (OZEMPIC) 0.25 or 0.5 MG/DOSE SOPN, Inject 0.5 mg into the skin once a week., Disp: 1 pen,  Rfl: 3 .  traZODone (DESYREL) 100 MG tablet, Take 1-1.5 tablets (100-150 mg total) by mouth at bedtime., Disp: 135 tablet, Rfl: 1  No Known Allergies   ROS  Constitutional: Negative for fever or weight change.  Respiratory: positive  for cough and mild shortness of breath.   Cardiovascular: Negative for chest pain or palpitations.  Gastrointestinal: Negative for abdominal pain, no bowel changes.  Musculoskeletal: Negative for gait problem or joint swelling.  Skin: Negative for rash.  Neurological: Negative for dizziness or headache.  No other specific complaints in a complete review of systems (except as listed in HPI above).  Objective  Vitals:   02/03/18 1057  BP: 116/72  Pulse: 81  Resp: 16  Temp: 97.7 F (36.5 C)  TempSrc: Oral  SpO2: 97%  Weight: 160 lb 11.2 oz (72.9 kg)  Height: 4\' 11"  (1.499 m)    Body mass index is 32.46 kg/m.  Physical Exam  Constitutional: Patient appears well-developed and well-nourished. Obese No distress.  HEENT: head atraumatic, normocephalic, pupils equal and reactive to light,  neck supple, throat within normal limits Cardiovascular: Normal rate, regular rhythm and normal heart sounds.  No murmur heard. No BLE edema. Pulmonary/Chest: Effort normal and breath sounds normal. No respiratory distress. Abdominal: Soft.  There is no tenderness. Psychiatric: Patient has a normal mood and affect. behavior is normal. Judgment and thought content normal.  PHQ2/9: [facility] 02/03/2018 10/28/2017 04/28/2017 12/04/2016 08/07/2016  Decreased Interest 1 2 3 1  0  Down, Depressed, Hopeless - 1 1 3  0  PHQ - 2 Score 1 3 4 4  0  Altered sleeping 0 1 2 3  -  Tired, decreased energy 2 1 3 2  -  Change in appetite 0 0 3 0 -  Feeling bad or failure about yourself  0 0 3 0 -  Trouble concentrating 0 0 3 1 -  Moving slowly or fidgety/restless 0 1 3 2  -  Suicidal thoughts - 0 0 0 -  PHQ-9 Score 3 6 21 12  -  Difficult doing work/chores Somewhat  difficult Not difficult  at all Somewhat difficult Very difficult -    Fall Risk: Fall Risk  02/03/2018 10/28/2017 09/23/2017 07/29/2017 07/03/2017  Falls in the past year? Yes No No No No  Number falls in past yr: 1 - - - -  Injury with Fall? Yes - - - -  Comment Right Ankle - - - -  Follow up - - - - -     Assessment & Plan   1. Diabetes mellitus type 2 in obese (HCC)  - POCT UA - Microalbumin  2. Colon cancer screening  - Cologuard  3. Chronic obstructive pulmonary disease, unspecified COPD type (HCC)  Recent flare, still coughing, still using rescue inhaler   Stop Bevespi  - Fluticasone-Umeclidin-Vilant (TRELEGY ELLIPTA) 100-62.5-25 MCG/INH AEPB; Inhale 1 puff into the lungs daily.  Dispense: 60 each; Refill: 5 - albuterol (PROAIR HFA) 108 (90 Base) MCG/ACT inhaler; Inhale 2 puffs into the lungs every 6 (six) hours as needed for wheezing or shortness of breath.  Dispense: 18 g; Refill: 0  4. Dyslipidemia  Continue Atorvastatin   5. Morbid obesity (HCC)  Discussed with the patient the risk posed by an increased BMI. Discussed importance of portion control, calorie counting and at least 150 minutes of physical activity weekly. Avoid sweet beverages and drink more water. Eat at least 6 servings of fruit and vegetables daily   6. Dyslipidemia associated with type 2 diabetes mellitus (HCC)  - COMPLETE METABOLIC PANEL WITH GFR - Lipid panel - Hemoglobin A1c  7. Other specified hypothyroidism  - TSH  8. Antibiotic-induced yeast infection  - fluconazole (DIFLUCAN) 150 MG tablet; Take 1 tablet (150 mg total) by mouth every other day.  Dispense: 3 tablet; Refill: 0

## 2018-02-04 LAB — COMPLETE METABOLIC PANEL WITH GFR
AG Ratio: 1.9 (calc) (ref 1.0–2.5)
ALKALINE PHOSPHATASE (APISO): 83 U/L (ref 33–130)
ALT: 20 U/L (ref 6–29)
AST: 15 U/L (ref 10–35)
Albumin: 3.6 g/dL (ref 3.6–5.1)
BILIRUBIN TOTAL: 0.9 mg/dL (ref 0.2–1.2)
BUN: 10 mg/dL (ref 7–25)
CO2: 30 mmol/L (ref 20–32)
Calcium: 9.1 mg/dL (ref 8.6–10.4)
Chloride: 103 mmol/L (ref 98–110)
Creat: 0.92 mg/dL (ref 0.50–0.99)
GFR, Est African American: 77 mL/min/{1.73_m2} (ref >=60)
GFR, Est Non African American: 66 mL/min/{1.73_m2} (ref >=60)
GLUCOSE: 85 mg/dL (ref 65–139)
Globulin: 1.9 g/dL (calc) (ref 1.9–3.7)
Potassium: 4.2 mmol/L (ref 3.5–5.3)
Sodium: 140 mmol/L (ref 135–146)
Total Protein: 5.5 g/dL — ABNORMAL LOW (ref 6.1–8.1)

## 2018-02-04 LAB — HEMOGLOBIN A1C
EAG (MMOL/L): 6 (calc)
HEMOGLOBIN A1C: 5.4 %{Hb} (ref <5.7)
Mean Plasma Glucose: 108 (calc)

## 2018-02-04 LAB — LIPID PANEL
CHOL/HDL RATIO: 2 (calc) (ref <5.0)
Cholesterol: 103 mg/dL (ref <200)
HDL: 51 mg/dL (ref >50)
LDL CHOLESTEROL (CALC): 39 mg/dL
NON-HDL CHOLESTEROL (CALC): 52 mg/dL (ref <130)
Triglycerides: 58 mg/dL (ref <150)

## 2018-02-04 LAB — TSH: TSH: 1 mIU/L (ref 0.40–4.50)

## 2018-02-10 ENCOUNTER — Ambulatory Visit: Payer: BLUE CROSS/BLUE SHIELD | Admitting: Licensed Clinical Social Worker

## 2018-02-10 ENCOUNTER — Ambulatory Visit: Payer: BLUE CROSS/BLUE SHIELD | Admitting: Psychiatry

## 2018-02-18 ENCOUNTER — Other Ambulatory Visit: Payer: Self-pay | Admitting: Family Medicine

## 2018-02-18 DIAGNOSIS — E669 Obesity, unspecified: Principal | ICD-10-CM

## 2018-02-18 DIAGNOSIS — E1169 Type 2 diabetes mellitus with other specified complication: Secondary | ICD-10-CM

## 2018-02-23 ENCOUNTER — Other Ambulatory Visit: Payer: Self-pay | Admitting: Family Medicine

## 2018-02-23 ENCOUNTER — Ambulatory Visit
Admission: RE | Admit: 2018-02-23 | Discharge: 2018-02-23 | Disposition: A | Discharge status: Home / Self Care | Payer: BLUE CROSS/BLUE SHIELD | Source: Ambulatory Visit | Attending: Family Medicine | Admitting: Family Medicine

## 2018-02-23 DIAGNOSIS — J449 Chronic obstructive pulmonary disease, unspecified: Secondary | ICD-10-CM

## 2018-02-23 DIAGNOSIS — Z1231 Encounter for screening mammogram for malignant neoplasm of breast: Secondary | ICD-10-CM | POA: Insufficient documentation

## 2018-02-23 DIAGNOSIS — Z1239 Encounter for other screening for malignant neoplasm of breast: Secondary | ICD-10-CM

## 2018-02-23 NOTE — Telephone Encounter (Signed)
[facility]   Last office visit 02/03/2018   Follow up on 05/06/2018

## 2018-03-03 ENCOUNTER — Encounter: Payer: Self-pay | Admitting: Family Medicine

## 2018-03-03 ENCOUNTER — Ambulatory Visit: Payer: BLUE CROSS/BLUE SHIELD | Admitting: Family Medicine

## 2018-03-03 VITALS — BP 114/68 | HR 96 | Temp 97.5°F | Resp 16 | Ht 59.0 in | Wt 157.5 lb

## 2018-03-03 DIAGNOSIS — E1169 Type 2 diabetes mellitus with other specified complication: Secondary | ICD-10-CM

## 2018-03-03 DIAGNOSIS — M48062 Spinal stenosis, lumbar region with neurogenic claudication: Secondary | ICD-10-CM

## 2018-03-03 DIAGNOSIS — J449 Chronic obstructive pulmonary disease, unspecified: Secondary | ICD-10-CM

## 2018-03-03 DIAGNOSIS — E669 Obesity, unspecified: Secondary | ICD-10-CM

## 2018-03-03 DIAGNOSIS — Z01818 Encounter for other preprocedural examination: Secondary | ICD-10-CM | POA: Diagnosis not present

## 2018-03-03 MED ORDER — ALBUTEROL SULFATE (2.5 MG/3ML) 0.083% IN NEBU
2.5000 mg | INHALATION_SOLUTION | Freq: Once | RESPIRATORY_TRACT | Status: AC
  Administered 2018-03-03: 2.5 mg via RESPIRATORY_TRACT

## 2018-03-03 NOTE — Progress Notes (Signed)
Name: Sara Andrews   MRN: [id number]    DOB: 1956/04/17   Date:03/03/2018       Progress Note  Subjective  Chief Complaint  Chief Complaint  Patient presents with  . Procedure    Surgery Clearance for Dr. Noel Geroldohen from [facility] scheduled for 03/16/2018.     HPI  Pre-op clearance: requested by Dr. Noel Geroldohen for lumbar spine surgery on 03/16/2018. She has DM, COPD . She had last COPD flare one month ago but states since started on Trelegy she is breathing well, only has mild cough in am. She recently labs done and we will fax results. Diabetes is under control, with hgbA1C normal She states she does not have chest pain or SOB, she has to stop because of back pain. She states back pain is daily 6/10 usually, taking oxycodone prn. Radiates to legs and has tingling on her feet. Looking forward on having surgery done. She has a history of knee surgery, no reactions to anesthesia. She has upper dentures.    Patient Active Problem List   Diagnosis Date Noted  . Spondylosis of lumbar spine 11/06/2016  . Spinal stenosis of lumbar region with neurogenic claudication 10/28/2016  . Chronic bilateral low back pain with bilateral sciatica 09/16/2016  . Hyperglycemia 08/07/2016  . Moderate episode of recurrent major depressive disorder (HCC) 08/07/2016  . Pain of left heel 06/02/2016  . Closed fracture of lateral portion of left tibial plateau 03/10/2016  . Degenerative cervical disc 03/04/2016  . Gastroesophageal reflux disease without esophagitis 02/01/2016  . Primary osteoarthritis of right hand 02/01/2016  . Paresthesias in left hand 02/01/2016  . SOB (shortness of breath) 10/05/2013  . COPD (chronic obstructive pulmonary disease) (HCC) 10/05/2013  . History of smoking 10/05/2013  . Hyperlipidemia 10/05/2013  . Hypothyroidism 10/05/2013    Past Surgical History:  Procedure Laterality Date  . ABDOMINAL HYSTERECTOMY    . CARPAL TUNNEL RELEASE Right   .  OOPHORECTOMY    . ORIF TIBIA PLATEAU Left 03/10/2016   Procedure: OPEN REDUCTION INTERNAL FIXATION (ORIF) BICONDYLAR TIBIAL PLATEAU FRACTURE;  Surgeon: Eldred MangesMark C Yates, MD;  Location: [facility] OR;  Service: Orthopedics;  Laterality: Left;  . SHOULDER ARTHROSCOPY W/ ROTATOR CUFF REPAIR Right   . TONSILLECTOMY AND ADENOIDECTOMY      Family History  Problem Relation Age of Onset  . Hypertension Daughter   . Breast cancer Daughter   . Diabetes Father   . Heart disease Father   . Breast cancer Maternal Aunt 60  . Breast cancer Paternal Aunt [id number]  . Depression Sister   . Alcohol abuse Sister   . Anxiety disorder Sister   . Bipolar disorder Sister     Social History   Socioeconomic History  . Marital status: Widowed    Spouse name: Not on file  . Number of children: 3  . Years of education: Not on file  . Highest education level: 10th grade  Occupational History    Comment: retired  Engineer, productionocial Needs  . Financial resource strain: Not hard at all  . Food insecurity:    Worry: Never true    Inability: Never true  . Transportation needs:    Medical: No    Non-medical: No  Tobacco Use  . Smoking status: Former Smoker    Packs/day: 2.00    Years: 30.00    Pack years: 60.00    Last attempt to quit: 09/03/2008    Years since quitting: 9.5  . Smokeless tobacco: Never  Used  Substance and Sexual Activity  . Alcohol use: No  . Drug use: No  . Sexual activity: Never  Lifestyle  . Physical activity:    Days per week: 0 days    Minutes per session: 0 min  . Stress: Not at all  Relationships  . Social connections:    Talks on phone: Twice a week    Gets together: Never    Attends religious service: More than 4 times per year    Active member of club or organization: No    Attends meetings of clubs or organizations: Never    Relationship status: Widowed  . Intimate partner violence:    Fear of current or ex partner: No    Emotionally abused: No    Physically abused: No    Forced sexual  activity: No  Other Topics Concern  . Not on file  Social History Narrative   She used to work at  [employer], but had a left knee work related injury 03/10/2016 , require left knee surgery and is now having back pain, that is getting evaluated, Out of work since.       Patient recently found out that one of her twins [name]) was diagnosed with breast cancer); will be having a double mastectomy soon.     Current Outpatient Medications:  .  acyclovir (ZOVIRAX) 400 MG tablet, TAKE 1 TABLET(400 MG) BY MOUTH DAILY, Disp: 30 tablet, Rfl: 0 .  albuterol (PROVENTIL) (2.5 MG/3ML) 0.083% nebulizer solution, Take 3 mLs (2.5 mg total) by nebulization every 6 (six) hours as needed for wheezing or shortness of breath., Disp: 75 mL, Rfl: 2 .  atorvastatin (LIPITOR) 40 MG tablet, Take 1 tablet (40 mg total) by mouth daily., Disp: 90 tablet, Rfl: 0 .  buPROPion (WELLBUTRIN XL) 150 MG 24 hr tablet, Take 1 tablet (150 mg total) by mouth daily., Disp: 90 tablet, Rfl: 1 .  Cholecalciferol (VITAMIN D PO), Take 1 capsule by mouth daily., Disp: , Rfl:  .  diclofenac sodium (VOLTAREN) 1 % GEL, Apply 4 g topically 2 (two) times daily as needed., Disp: 2 Tube, Rfl: 1 .  DULoxetine (CYMBALTA) 30 MG capsule, Take 2 capsules (60 mg total) by mouth daily., Disp: 180 capsule, Rfl: 1 .  fluconazole (DIFLUCAN) 150 MG tablet, Take 1 tablet (150 mg total) by mouth every other day., Disp: 3 tablet, Rfl: 0 .  fluocinolone (VANOS) 0.01 % cream, Apply topically 2 (two) times daily., Disp: 60 g, Rfl: 0 .  fluocinonide (LIDEX) 0.05 % external solution, APP EXT AA BID, Disp: , Rfl: 0 .  Fluticasone-Umeclidin-Vilant (TRELEGY ELLIPTA) 100-62.5-25 MCG/INH AEPB, Inhale 1 puff into the lungs daily., Disp: 60 each, Rfl: 5 .  HYDROcodone-acetaminophen (NORCO/VICODIN) 5-325 MG tablet, Take 1 tablet by mouth every 12 (twelve) hours as needed., Disp: , Rfl: 0 .  levothyroxine (SYNTHROID, LEVOTHROID) 88 MCG tablet, TAKE 1 TABLET BY MOUTH  DAILY BEFORE BREAKFAST, Disp: 90 tablet, Rfl: 0 .  Magnesium Oxide 400 MG CAPS, Take 1 capsule (400 mg total) by mouth once., Disp: 30 capsule, Rfl: 5 .  montelukast (SINGULAIR) 10 MG tablet, One daily, Disp: 90 tablet, Rfl: 1 .  naproxen (NAPROSYN) 500 MG tablet, TK 1 T PO BID QD WITH FOOD PRN, Disp: , Rfl: 2 .  omeprazole (PRILOSEC) 40 MG capsule, Take 1 capsule (40 mg total) by mouth daily., Disp: 90 capsule, Rfl: 1 .  OZEMPIC 0.25 or 0.5 MG/DOSE SOPN, INJECT 0.5 MG UNDER THE SKIN ONCE A  WEEK, Disp: 9 mL, Rfl: 0 .  polyethylene glycol powder (GLYCOLAX/MIRALAX) powder, Take 17 g by mouth 2 (two) times daily as needed., Disp: 3350 g, Rfl: 1 .  PROAIR HFA 108 (90 Base) MCG/ACT inhaler, INHALE 2 PUFFS INTO THE LUNGS EVERY 6 HOURS AS NEEDED FOR WHEEZING OR SHORTNESS OF BREATH, Disp: 8.5 g, Rfl: 0 .  traZODone (DESYREL) 100 MG tablet, Take 1-1.5 tablets (100-150 mg total) by mouth at bedtime., Disp: 135 tablet, Rfl: 1 .  traMADol (ULTRAM) 50 MG tablet, Take 1 tablet by mouth 2 (two) times daily., Disp: , Rfl:   No Known Allergies   ROS  Constitutional: Negative for fever or weight change.  Respiratory: Positive for cough in the mornings but no shortness of breath lately .   Cardiovascular: Negative for chest pain or palpitations.  Gastrointestinal: Negative for abdominal pain, no bowel changes.  Musculoskeletal: Positive  for gait problem or joint swelling.  Skin: Negative for rash.  Neurological: Negative for dizziness or headache.  No other specific complaints in a complete review of systems (except as listed in HPI above).  Objective  Vitals:   03/03/18 1210  BP: 114/68  Pulse: 96  Resp: 16  Temp: (!) 97.5 F (36.4 C)  TempSrc: Oral  SpO2: 96%  Weight: 157 lb 8 oz (71.4 kg)  Height: 4\' 11"  (1.499 m)    Body mass index is 31.81 kg/m.  Physical Exam  Constitutional: Patient appears well-developed and well-nourished. Obese No distress.  HEENT: head atraumatic,  normocephalic, pupils equal and reactive to light,neck supple, throat within normal limits Cardiovascular: Normal rate, regular rhythm and normal heart sounds.  No murmur heard. No BLE edema. Pulmonary/Chest: Effort normal and breath sounds normal. No respiratory distress. Abdominal: Soft.  There is no tenderness. Muscular Skeletal: no pain during palpation of lumbar spine, but has positive straight leg raise  Psychiatric: Patient has a normal mood and affect. behavior is normal. Judgment and thought content normal.   Recent Results (from the past 2160 hour(s))  POCT UA - Microalbumin     Status: Normal   Collection Time: 02/03/18 11:38 AM  Result Value Ref Range   Microalbumin Ur, POC 20 mg/L   Creatinine, POC  mg/dL   Albumin/Creatinine Ratio, Urine, POC    POCT HgB A1C     Status: Normal   Collection Time: 02/03/18 11:41 AM  Result Value Ref Range   Hemoglobin A1C 5.6 4.0 - 5.6 %   HbA1c POC (<> result, manual entry)  4.0 - 5.6 %   HbA1c, POC (prediabetic range)  5.7 - 6.4 %   HbA1c, POC (controlled diabetic range)  0.0 - 7.0 %  COMPLETE METABOLIC PANEL WITH GFR     Status: Abnormal   Collection Time: 02/03/18 11:46 AM  Result Value Ref Range   Glucose, Bld 85 65 - 139 mg/dL    Comment: .        Non-fasting reference interval .    BUN 10 7 - 25 mg/dL   Creat 1.61 0.96 - 0.45 mg/dL    Comment: For patients >62 years of age, the reference limit for Creatinine is approximately 13% higher for people identified as African-American. .    GFR, Est Non African American 66 > OR = 60 mL/min/1.79m2   GFR, Est African American 77 > OR = 60 mL/min/1.38m2   BUN/Creatinine Ratio NOT APPLICABLE 6 - 22 (calc)   Sodium 140 135 - 146 mmol/L   Potassium 4.2 3.5 - 5.3 mmol/L  Chloride 103 98 - 110 mmol/L   CO2 30 20 - 32 mmol/L   Calcium 9.1 8.6 - 10.4 mg/dL   Total Protein 5.5 (L) 6.1 - 8.1 g/dL   Albumin 3.6 3.6 - 5.1 g/dL   Globulin 1.9 1.9 - 3.7 g/dL (calc)   AG Ratio 1.9 1.0 - 2.5  (calc)   Total Bilirubin 0.9 0.2 - 1.2 mg/dL   Alkaline phosphatase (APISO) 83 33 - 130 U/L   AST 15 10 - 35 U/L   ALT 20 6 - 29 U/L  Lipid panel     Status: None   Collection Time: 02/03/18 11:46 AM  Result Value Ref Range   Cholesterol 103 <200 mg/dL   HDL 51 >16 mg/dL   Triglycerides 58 <109 mg/dL   LDL Cholesterol (Calc) 39 mg/dL (calc)    Comment: Reference range: <100 . Desirable range <100 mg/dL for primary prevention;   <70 mg/dL for patients with CHD or diabetic patients  with > or = 2 CHD risk factors. Marland Kitchen LDL-C is now calculated using the Martin-Hopkins  calculation, which is a validated novel method providing  better accuracy than the Friedewald equation in the  estimation of LDL-C.  Horald Pollen et al. Lenox Ahr. 6045;409(81): 2061-2068  (http://education.[facility].com/faq/FAQ164)    Total CHOL/HDL Ratio 2.0 <5.0 (calc)   Non-HDL Cholesterol (Calc) 52 <191 mg/dL (calc)    Comment: For patients with diabetes plus 1 major ASCVD risk  factor, treating to a non-HDL-C goal of <100 mg/dL  (LDL-C of <47 mg/dL) is considered a therapeutic  option.   TSH     Status: None   Collection Time: 02/03/18 11:46 AM  Result Value Ref Range   TSH 1.00 0.40 - 4.50 mIU/L  Hemoglobin A1c     Status: None   Collection Time: 02/03/18 11:46 AM  Result Value Ref Range   Hgb A1c MFr Bld 5.4 <5.7 % of total Hgb    Comment: For the purpose of screening for the presence of diabetes: . <5.7%       Consistent with the absence of diabetes 5.7-6.4%    Consistent with increased risk for diabetes             (prediabetes) > or =6.5%  Consistent with diabetes . This assay result is consistent with a decreased risk of diabetes. . Currently, no consensus exists regarding use of hemoglobin A1c for diagnosis of diabetes in children. . According to American Diabetes Association (ADA) guidelines, hemoglobin A1c <7.0% represents optimal control in non-pregnant diabetic patients.  Different metrics may apply to specific patient populations.  Standards of Medical Care in Diabetes(ADA). .    Mean Plasma Glucose 108 (calc)   eAG (mmol/L) 6.0 (calc)     PHQ2/9: Depression screen [facility] 2/9 02/03/2018 10/28/2017 04/28/2017 12/04/2016 08/07/2016  Decreased Interest 1 2 3 1  0  Down, Depressed, Hopeless - 1 1 3  0  PHQ - 2 Score 1 3 4 4  0  Altered sleeping 0 1 2 3  -  Tired, decreased energy 2 1 3 2  -  Change in appetite 0 0 3 0 -  Feeling bad or failure about yourself  0 0 3 0 -  Trouble concentrating 0 0 3 1 -  Moving slowly or fidgety/restless 0 1 3 2  -  Suicidal thoughts - 0 0 0 -  PHQ-9 Score 3 6 21 12  -  Difficult doing work/chores Somewhat difficult Not difficult at all Somewhat difficult Very difficult -     Fall Risk:  Fall Risk  02/03/2018 10/28/2017 09/23/2017 07/29/2017 07/03/2017  Falls in the past year? Yes No No No No  Number falls in past yr: 1 - - - -  Injury with Fall? Yes - - - -  Comment Right Ankle - - - -  Follow up - - - - -    Assessment & Plan  1. Chronic obstructive pulmonary disease, unspecified COPD type (HCC)  - PR EVAL OF BRONCHOSPASM - FVC/FV! Above 80%  2. Diabetes mellitus type 2 in obese (HCC)  At goal  3. Spinal stenosis of lumbar region with neurogenic claudication  Proceed to surgery   4. Pre-op evaluation  Stop nsaid's prior to surgery as requested, 5 days prior  - EKG 12-Lead

## 2018-03-08 ENCOUNTER — Ambulatory Visit: Payer: BLUE CROSS/BLUE SHIELD | Admitting: Psychiatry

## 2018-03-10 ENCOUNTER — Encounter: Payer: Self-pay | Admitting: Family Medicine

## 2018-03-15 ENCOUNTER — Ambulatory Visit (INDEPENDENT_AMBULATORY_CARE_PROVIDER_SITE_OTHER): Payer: BLUE CROSS/BLUE SHIELD | Admitting: Licensed Clinical Social Worker

## 2018-03-15 ENCOUNTER — Encounter

## 2018-03-15 DIAGNOSIS — F331 Major depressive disorder, recurrent, moderate: Secondary | ICD-10-CM | POA: Diagnosis not present

## 2018-03-16 HISTORY — PX: LUMBAR LAMINECTOMY: SHX95

## 2018-03-20 ENCOUNTER — Other Ambulatory Visit: Payer: Self-pay | Admitting: Psychiatry

## 2018-03-20 DIAGNOSIS — F331 Major depressive disorder, recurrent, moderate: Secondary | ICD-10-CM

## 2018-04-06 NOTE — Progress Notes (Signed)
THERAPIST PROGRESS NOTE  Session Time: 31mn  Participation Level: Active  Behavioral Response: CasualAlertDepressed  Type of Therapy: Individual Therapy  Treatment Goals addressed: Coping  Interventions: CBT and Motivational Interviewing  Summary: Sara KANANis a 62y.o. female who presents with continued symptoms of her diagnosis. Therapist met with Patient in an outpatient setting to assess current mood and assist with making progress towards goals through the use of therapeutic intervention. Therapist did a brief mood check, assessing anger, fear, disgust, excitement, happiness, and sadness.  Patient reports a pleasant mood.  Provided support for Patient as she discussed her current mood and her mood since last session.  Stressed the importance of mood stabilization through learned coping skills and medication management.  Encouraged Patient to focus on her strengths and the positive aspects.   Suicidal/Homicidal: No  Plan: Return again in 2 weeks.  Diagnosis: Axis I: Depression    Axis II: No diagnosis    NLubertha South LCSW 03/15/2018

## 2018-04-19 ENCOUNTER — Other Ambulatory Visit: Payer: Self-pay | Admitting: Family Medicine

## 2018-04-19 DIAGNOSIS — E038 Other specified hypothyroidism: Secondary | ICD-10-CM

## 2018-04-19 DIAGNOSIS — K219 Gastro-esophageal reflux disease without esophagitis: Secondary | ICD-10-CM

## 2018-05-04 ENCOUNTER — Other Ambulatory Visit: Payer: Self-pay | Admitting: Family Medicine

## 2018-05-04 DIAGNOSIS — E785 Hyperlipidemia, unspecified: Secondary | ICD-10-CM

## 2018-05-04 NOTE — Telephone Encounter (Signed)
Refill Request for Cholesterol medication. Atorvastatin to [facility].   Last visit: 03/03/2018   Lab Results  Component Value Date   CHOL 103 02/03/2018   HDL 51 02/03/2018   LDLCALC 39 02/03/2018   TRIG 58 02/03/2018   CHOLHDL 2.0 02/03/2018    Follow up on 05/26/2018

## 2018-05-06 ENCOUNTER — Ambulatory Visit: Payer: Self-pay | Admitting: Family Medicine

## 2018-05-10 ENCOUNTER — Ambulatory Visit (INDEPENDENT_AMBULATORY_CARE_PROVIDER_SITE_OTHER): Payer: BLUE CROSS/BLUE SHIELD | Admitting: Licensed Clinical Social Worker

## 2018-05-10 DIAGNOSIS — F331 Major depressive disorder, recurrent, moderate: Secondary | ICD-10-CM

## 2018-05-17 LAB — HM DIABETES EYE EXAM

## 2018-05-18 ENCOUNTER — Encounter: Payer: Self-pay | Admitting: Family Medicine

## 2018-05-26 ENCOUNTER — Ambulatory Visit (INDEPENDENT_AMBULATORY_CARE_PROVIDER_SITE_OTHER): Payer: BLUE CROSS/BLUE SHIELD | Admitting: Family Medicine

## 2018-05-26 ENCOUNTER — Encounter: Payer: Self-pay | Admitting: Family Medicine

## 2018-05-26 VITALS — BP 118/82 | HR 101 | Temp 98.4°F | Resp 16 | Ht 59.0 in | Wt 149.8 lb

## 2018-05-26 DIAGNOSIS — K219 Gastro-esophageal reflux disease without esophagitis: Secondary | ICD-10-CM

## 2018-05-26 DIAGNOSIS — E1169 Type 2 diabetes mellitus with other specified complication: Secondary | ICD-10-CM | POA: Diagnosis not present

## 2018-05-26 DIAGNOSIS — J449 Chronic obstructive pulmonary disease, unspecified: Secondary | ICD-10-CM

## 2018-05-26 DIAGNOSIS — E785 Hyperlipidemia, unspecified: Secondary | ICD-10-CM | POA: Diagnosis not present

## 2018-05-26 DIAGNOSIS — R519 Headache, unspecified: Secondary | ICD-10-CM

## 2018-05-26 DIAGNOSIS — F331 Major depressive disorder, recurrent, moderate: Secondary | ICD-10-CM

## 2018-05-26 DIAGNOSIS — E669 Obesity, unspecified: Secondary | ICD-10-CM

## 2018-05-26 DIAGNOSIS — Z1211 Encounter for screening for malignant neoplasm of colon: Secondary | ICD-10-CM

## 2018-05-26 DIAGNOSIS — R51 Headache: Secondary | ICD-10-CM

## 2018-05-26 MED ORDER — SEMAGLUTIDE(0.25 OR 0.5MG/DOS) 2 MG/1.5ML ~~LOC~~ SOPN: 0.5000 ng | PEN_INJECTOR | SUBCUTANEOUS | 0 refills | Status: DC

## 2018-05-26 MED ORDER — ROSUVASTATIN CALCIUM 20 MG PO TABS: 20.0000 mg | ORAL_TABLET | Freq: Every day | ORAL | 1 refills | Status: DC

## 2018-05-26 NOTE — Progress Notes (Signed)
Name: Sara Andrews   MRN: [id number]    DOB: 1956-02-28   Date:05/26/2018       Progress Note  Subjective  Chief Complaint  Chief Complaint  Patient presents with  . Follow-up    3 mth f/u  . Diabetes  . Hypertension  . Gastroesophageal Reflux  . Hypothyroidism  . Depression  . DDD and spondylolidthesis  . Headache  . Dizziness    HPI  DDD andspondylolisthesis: doing well since surgery done 02/2018. Doing well, off pain medication, taking prn tylenol only and since pain is under control, mood has improved also.   COPD: shehas been taking Bevespi  But had another COPD flare that had to go to  Urgent Care 01/26/2018 and was given Prednisone taper, doxy and cough medication, she is still coughing, productive but getting better. She quit smoking in 2009. We will also switch from bevespi to Trelegy since had 3  flares in 9 months,    GERD: she is back on omeprazole daily , no heartburn or regurgitation, she has nausea because of ozempic   Hypothyroidism: reviewed labs and TSHdone 01/2018 at goal, continue current dose,she states constipation is stable. No hair loss or constipation   DM II : diagnosed at work back in 2012she used to take medications, last hgbA1C was 6.2%, down to 6.0down to 5.8%, elevated fasting insulin.She denies polyphagia, polyuria , she has polydipsia. Started on Trulicity June 2018, having some constipation and indigestion, we switched to Ozempic 10/2018and is down 40  lbs since started on medication one year ago.She is going back for dilated eye exam this week   Major Depression: she states she has been taking medication, she felt better when the pain improved, seeing Dr. Elna Breslow. She is feeling better, she was seen therapist 2 months ago and states she does not feel like she needs to go back to her. Advised to go back to Dr. Elna Breslow to have refills of medication. Daugther is doing well since breast cancer surgery   URI/headache: seen at local  urgent last week, given zpack , advised her to try saline nasal spray and cold compresses, headache is dull, temporal and frontal, nagging, no focal deficit, she has nasal congestion  Patient Active Problem List   Diagnosis Date Noted  . Spondylosis of lumbar spine 11/06/2016  . Spinal stenosis of lumbar region with neurogenic claudication 10/28/2016  . Chronic bilateral low back pain with bilateral sciatica 09/16/2016  . Hyperglycemia 08/07/2016  . Moderate episode of recurrent major depressive disorder (HCC) 08/07/2016  . Pain of left heel 06/02/2016  . Closed fracture of lateral portion of left tibial plateau 03/10/2016  . Degenerative cervical disc 03/04/2016  . Gastroesophageal reflux disease without esophagitis 02/01/2016  . Primary osteoarthritis of right hand 02/01/2016  . Paresthesias in left hand 02/01/2016  . SOB (shortness of breath) 10/05/2013  . COPD (chronic obstructive pulmonary disease) (HCC) 10/05/2013  . History of smoking 10/05/2013  . Hyperlipidemia 10/05/2013  . Hypothyroidism 10/05/2013    Past Surgical History:  Procedure Laterality Date  . ABDOMINAL HYSTERECTOMY    . CARPAL TUNNEL RELEASE Right   . LUMBAR LAMINECTOMY  03/16/2018   L4-5 Laminectomy & Fusion w/ Pedicle Screws, TLIF & Allograft; Surgeon: Virl Diamond, MD; Location: [facility] MAIN OR; Service: Orthopedics; Laterality: N/A; Prone, ProAxis, Stim Neuromonitoring, O-Arm, Cell Saver, Bone Mill, Medtronic, Justin, PACS on [facility]    . OOPHORECTOMY    . ORIF TIBIA PLATEAU Left 03/10/2016   Procedure: OPEN REDUCTION INTERNAL  FIXATION (ORIF) BICONDYLAR TIBIAL PLATEAU FRACTURE;  Surgeon: Eldred Manges, MD;  Location: [facility] OR;  Service: Orthopedics;  Laterality: Left;  . SHOULDER ARTHROSCOPY W/ ROTATOR CUFF REPAIR Right   . TONSILLECTOMY AND ADENOIDECTOMY      Family History  Problem Relation Age of Onset  . Hypertension Daughter   . Breast cancer Daughter   . Diabetes Father   . Heart disease Father   .  Breast cancer Maternal Aunt 60  . Breast cancer Paternal Aunt 64  . Depression Sister   . Alcohol abuse Sister   . Anxiety disorder Sister   . Bipolar disorder Sister   . Pneumonia Sister   . Skin cancer Daughter   . Anxiety disorder Daughter     Social History   Socioeconomic History  . Marital status: Widowed    Spouse name: Not on file  . Number of children: 3  . Years of education: Not on file  . Highest education level: 10th grade  Occupational History  . Occupation: disabled    Comment: retired  Engineer, production  . Financial resource strain: Not hard at all  . Food insecurity:    Worry: Never true    Inability: Never true  . Transportation needs:    Medical: No    Non-medical: No  Tobacco Use  . Smoking status: Former Smoker    Packs/day: 2.00    Years: 30.00    Pack years: 60.00    Last attempt to quit: 09/03/2008    Years since quitting: 9.7  . Smokeless tobacco: Never Used  Substance and Sexual Activity  . Alcohol use: No  . Drug use: No  . Sexual activity: Never  Lifestyle  . Physical activity:    Days per week: 0 days    Minutes per session: 0 min  . Stress: Not at all  Relationships  . Social connections:    Talks on phone: Twice a week    Gets together: Never    Attends religious service: More than 4 times per year    Active member of club or organization: No    Attends meetings of clubs or organizations: Never    Relationship status: Widowed  . Intimate partner violence:    Fear of current or ex partner: No    Emotionally abused: No    Physically abused: No    Forced sexual activity: No  Other Topics Concern  . Not on file  Social History Narrative   She used to work at  [employer], but had a left knee work related injury 03/10/2016 , require left knee surgery and is now having back pain, that is getting evaluated, Out of work since.       Patient recently found out that one of her twins [name]) was diagnosed with breast cancer); will be  having a double mastectomy soon.        Current Outpatient Medications:  .  acyclovir (ZOVIRAX) 400 MG tablet, TAKE 1 TABLET(400 MG) BY MOUTH DAILY, Disp: 30 tablet, Rfl: 0 .  albuterol (PROVENTIL) (2.5 MG/3ML) 0.083% nebulizer solution, Take 3 mLs (2.5 mg total) by nebulization every 6 (six) hours as needed for wheezing or shortness of breath., Disp: 75 mL, Rfl: 2 .  buPROPion (WELLBUTRIN XL) 150 MG 24 hr tablet, TAKE 1 TABLET(150 MG) BY MOUTH DAILY, Disp: 90 tablet, Rfl: 0 .  Cholecalciferol (VITAMIN D PO), Take 1 capsule by mouth daily., Disp: , Rfl:  .  diclofenac sodium (VOLTAREN) 1 % GEL,  Apply 4 g topically 2 (two) times daily as needed., Disp: 2 Tube, Rfl: 1 .  DULoxetine (CYMBALTA) 30 MG capsule, Take 2 capsules (60 mg total) by mouth daily., Disp: 180 capsule, Rfl: 1 .  fluocinolone (VANOS) 0.01 % cream, Apply topically 2 (two) times daily., Disp: 60 g, Rfl: 0 .  fluocinonide (LIDEX) 0.05 % external solution, APP EXT AA BID, Disp: , Rfl: 0 .  Fluticasone-Umeclidin-Vilant (TRELEGY ELLIPTA) 100-62.5-25 MCG/INH AEPB, Inhale 1 puff into the lungs daily., Disp: 60 each, Rfl: 5 .  levothyroxine (SYNTHROID, LEVOTHROID) 88 MCG tablet, TAKE 1 TABLET BY MOUTH DAILY BEFORE BREAKFAST, Disp: 90 tablet, Rfl: 0 .  Magnesium Oxide 400 MG CAPS, Take 1 capsule (400 mg total) by mouth once., Disp: 30 capsule, Rfl: 5 .  montelukast (SINGULAIR) 10 MG tablet, One daily, Disp: 90 tablet, Rfl: 1 .  omeprazole (PRILOSEC) 40 MG capsule, TAKE 1 CAPSULE(40 MG) BY MOUTH DAILY, Disp: 90 capsule, Rfl: 0 .  polyethylene glycol powder (GLYCOLAX/MIRALAX) powder, Take 17 g by mouth 2 (two) times daily as needed., Disp: 3350 g, Rfl: 1 .  PROAIR HFA 108 (90 Base) MCG/ACT inhaler, INHALE 2 PUFFS INTO THE LUNGS EVERY 6 HOURS AS NEEDED FOR WHEEZING OR SHORTNESS OF BREATH, Disp: 8.5 g, Rfl: 0 .  rosuvastatin (CRESTOR) 20 MG tablet, Take 1 tablet (20 mg total) by mouth daily. In place of atorvastatin, Disp: 90 tablet, Rfl:  1 .  Semaglutide,0.25 or 0.5MG /DOS, (OZEMPIC, 0.25 OR 0.5 MG/DOSE,) 2 MG/1.5ML SOPN, Inject 0.5 ng into the skin once a week., Disp: 9 mL, Rfl: 0 .  traZODone (DESYREL) 100 MG tablet, Take 1-1.5 tablets (100-150 mg total) by mouth at bedtime., Disp: 135 tablet, Rfl: 1  No Known Allergies  I personally reviewed active problem list, medication list, allergies, family history, social history with the patient/caregiver today.   ROS  Constitutional: Negative for fever, positive for  weight change.  Respiratory: positive  For intermittent  cough and shortness of breath - went to urgent care last week but is doing better.    Cardiovascular: Negative for chest pain or palpitations.  Gastrointestinal: Negative for abdominal pain, no bowel changes.  Musculoskeletal: Negative for gait problem or joint swelling.  Skin: Negative for rash.  Neurological: positive for intermittentr dizziness or headache.  No other specific complaints in a complete review of systems (except as listed in HPI above).  Objective  Vitals:   05/26/18 1456  BP: 118/82  Pulse: (!) 101  Resp: 16  Temp: 98.4 F (36.9 C)  TempSrc: Oral  SpO2: 95%  Weight: 149 lb 12.8 oz (67.9 kg)  Height: 4\' 11"  (1.499 m)    Body mass index is 30.26 kg/m.  Physical Exam  Constitutional: Patient appears well-developed and well-nourished. Obese No distress.  HEENT: head atraumatic, normocephalic, pupils equal and reactive to light, neck supple, throat within normal limits, boggy turbinates, no tenderness during palpation of sinus  Cardiovascular: Normal rate, regular rhythm and normal heart sounds.  No murmur heard. No BLE edema. Pulmonary/Chest: Effort normal and breath sounds normal. No respiratory distress. Abdominal: Soft.  There is no tenderness. Psychiatric: Patient has a normal mood and affect. behavior is normal. Judgment and thought content normal. Neurological: no focal finding,    Recent Results (from the past 2160  hour(s))  [facility] DIABETES EYE EXAM     Status: None   Collection Time: 05/17/18 12:00 AM  Result Value Ref Range   [facility] Diabetic Eye Exam No Retinopathy No Retinopathy  Comment: [facility], Dr. Renaee Munda     PHQ2/9: Depression screen [facility] 2/9 05/26/2018 02/03/2018 10/28/2017 04/28/2017 12/04/2016  Decreased Interest 0 1 2 3 1   Down, Depressed, Hopeless 0 - 1 1 3   PHQ - 2 Score 0 1 3 4 4   Altered sleeping - 0 1 2 3   Tired, decreased energy - 2 1 3 2   Change in appetite - 0 0 3 0  Feeling bad or failure about yourself  - 0 0 3 0  Trouble concentrating - 0 0 3 1  Moving slowly or fidgety/restless - 0 1 3 2   Suicidal thoughts - - 0 0 0  PHQ-9 Score - 3 6 21 12   Difficult doing work/chores - Somewhat difficult Not difficult at all Somewhat difficult Very difficult    Fall Risk: Fall Risk  05/26/2018 02/03/2018 10/28/2017 09/23/2017 07/29/2017  Falls in the past year? No Yes No No No  Number falls in past yr: - 1 - - -  Injury with Fall? - Yes - - -  Comment - Right Ankle - - -  Follow up - - - - -     Functional Status Survey: Is the patient deaf or have difficulty hearing?: No Does the patient have difficulty seeing, even when wearing glasses/contacts?: No Does the patient have difficulty concentrating, remembering, or making decisions?: No Does the patient have difficulty walking or climbing stairs?: No Does the patient have difficulty dressing or bathing?: No Does the patient have difficulty doing errands alone such as visiting a doctor's office or shopping?: No   Assessment & Plan  1. Diabetes mellitus type 2 in obese (HCC)  - Semaglutide,0.25 or 0.5MG /DOS, (OZEMPIC, 0.25 OR 0.5 MG/DOSE,) 2 MG/1.5ML SOPN; Inject 0.5 ng into the skin once a week.  Dispense: 9 mL; Refill: 0  2. Chronic obstructive pulmonary disease, unspecified COPD type (HCC)  Recent flare but improving  3. Dyslipidemia  She does not know where atorvastatin bottle is and cannot refill it until December we  will change to crestor  - rosuvastatin (CRESTOR) 20 MG tablet; Take 1 tablet (20 mg total) by mouth daily. In place of atorvastatin  Dispense: 90 tablet; Refill: 1  4. GERD without esophagitis  Stable on medication   5. Moderate episode of recurrent major depressive disorder (HCC)  Seeing Dr. Elna Breslow and Mrs. Peacock and is feeling much better  6. Colon cancer screening  - Cologuard  7. Daily headache  Advised to drink more fluids and use nasal spray

## 2018-06-03 NOTE — Progress Notes (Signed)
   THERAPIST PROGRESS NOTE  Session Time: 13mn  Participation Level: Active  Behavioral Response: CasualAlertEuthymic  Type of Therapy: Individual Therapy  Treatment Goals addressed: Coping  Interventions: CBT and Motivational Interviewing  Summary: Sara WILDESis a 62y.o. female who presents with a reduction in symptoms.  Therapist met with Patient in an outpatient setting to assess current mood and assist with making progress towards goals through the use of therapeutic intervention. Therapist did a brief mood check, assessing anger, fear, disgust, excitement, happiness, and sadness.  Patient reports a "great" mood.  Patient reports that she and her family have mended.  She reports being able to see her Granddaughter and her new baby.  She reports that she wants to continue on this path.  Praised Patient for using communication skills to alleviate a concern.  Discussion of her symptoms.  Patient reports that she is using coping skills (exercise and interacting with family).  Explored any concerns with the upcoming season or holidays.  Disucssed her grief and loss concerns.   Suicidal/Homicidal: No  Plan: Return again in 2 weeks.  Diagnosis: Axis I: Depression    Axis II: No Diagnosis    NLubertha South LCSW 05/10/2018

## 2018-06-16 ENCOUNTER — Ambulatory Visit: Payer: Self-pay | Admitting: *Deleted

## 2018-06-16 NOTE — Telephone Encounter (Signed)
Pt reports headaches, onset "Over a month ago." Saw Dr. Carlynn PurlSowles 05/26/18. Pt states advised to have eyes checked to first rule out issue with vision. States she saw optometrist and no changes, "He said everything was normal." States headache is 6-7/10 when at it's worst, "Always there, dull." Reports "Right above eyes and towards the temples a little."  States light makes it worse. Also reports nausea, no vomiting. Has been taking Ibuprofen and tylenol for back pain "Doesn't touch the headache." States had migraines when she was in her 7820's. Denies dizziness, weakness; unsure of BP. Appt made with E. Poulose for tomorrow AM.  Care advise given per protocol.  Reason for Disposition . [1] MODERATE headache (e.g., interferes with normal activities) AND [2] present > 24 hours AND [3] unexplained  (Exceptions: analgesics not tried, typical migraine, or headache part of viral illness)  Answer Assessment - Initial Assessment Questions 1. LOCATION: "Where does it hurt?"      Above eyes towards temples 2. ONSET: "When did the headache start?" (Minutes, hours or days)      Month or longer 3. PATTERN: "Does the pain come and go, or has it been constant since it started?"     Dull pain at times, eases off a little, then strong again 4. SEVERITY: "How bad is the pain?" and "What does it keep you from doing?"  (e.g., Scale 1-10; mild, moderate, or severe)   - MILD (1-3): doesn't interfere with normal activities    - MODERATE (4-7): interferes with normal activities or awakens from sleep    - SEVERE (8-10): excruciating pain, unable to do any normal activities        6-7/10 5. RECURRENT SYMPTOM: "Have you ever had headaches before?" If so, ask: "When was the last time?" and "What happened that time?"      Seen in October 6. CAUSE: "What do you think is causing the headache?"     Unsure 7. MIGRAINE: "Have you been diagnosed with migraine headaches?" If so, ask: "Is this headache similar?"      Yes in my  20's 8. HEAD INJURY: "Has there been any recent injury to the head?"      no 9. OTHER SYMPTOMS: "Do you have any other symptoms?" (fever, stiff neck, eye pain, sore throat, cold symptoms)     No  Protocols used: HEADACHE-A-AH

## 2018-06-17 ENCOUNTER — Encounter: Payer: Self-pay | Admitting: Nurse Practitioner

## 2018-06-17 ENCOUNTER — Ambulatory Visit: Payer: BLUE CROSS/BLUE SHIELD | Admitting: Nurse Practitioner

## 2018-06-17 DIAGNOSIS — R51 Headache: Secondary | ICD-10-CM | POA: Diagnosis not present

## 2018-06-17 DIAGNOSIS — R519 Headache, unspecified: Secondary | ICD-10-CM

## 2018-06-17 MED ORDER — ONDANSETRON 4 MG PO TBDP: 4.0000 mg | ORAL_TABLET | Freq: Three times a day (TID) | ORAL | 0 refills | Status: DC | PRN

## 2018-06-17 MED ORDER — SUMATRIPTAN SUCCINATE 25 MG PO TABS: ORAL_TABLET | ORAL | 0 refills | Status: DC

## 2018-06-17 NOTE — Patient Instructions (Signed)

## 2018-06-17 NOTE — Progress Notes (Signed)
Name: Sara Andrews   MRN: [id number]    DOB: [birth year]   Date:06/17/2018       Progress Note  Subjective  Chief Complaint  Chief Complaint  Patient presents with  . Headache  . Nausea    HPI  Patient endorses headaches that have been intermittent for the past month. Endorses unilateral headaches with nausea states these have been lasting 6-8 hours has had some that have lasted over a day. Endorses some light sensitivity. She did have headaches in her early 20's none since then. No associated blurry vision, dizziness, tinnitus, aura- flashing lights, smell changes, fevers, chills, myalgias, unintentional weight loss.  She saw eye doctor thinking it was related to glass prescription; got new glasses and no relief of symptoms.   She saw back doctor due exacerbated back pain who recommended taking 650mg  of tylenol and 400mg  of ibuprofen three times day- this helped the back pain but has not provided any relief for the headaches.    Patient Active Problem List   Diagnosis Date Noted  . Spondylosis of lumbar spine 11/06/2016  . Spinal stenosis of lumbar region with neurogenic claudication 10/28/2016  . Chronic bilateral low back pain with bilateral sciatica 09/16/2016  . Hyperglycemia 08/07/2016  . Moderate episode of recurrent major depressive disorder (HCC) 08/07/2016  . Pain of left heel 06/02/2016  . Closed fracture of lateral portion of left tibial plateau 03/10/2016  . Degenerative cervical disc 03/04/2016  . Gastroesophageal reflux disease without esophagitis 02/01/2016  . Primary osteoarthritis of right hand 02/01/2016  . Paresthesias in left hand 02/01/2016  . SOB (shortness of breath) 10/05/2013  . COPD (chronic obstructive pulmonary disease) (HCC) 10/05/2013  . History of smoking 10/05/2013  . Hyperlipidemia 10/05/2013  . Hypothyroidism 10/05/2013    Past Medical History:  Diagnosis Date  . Anxiety   . Arthritis    "hips, hands, back" (03/10/2016)  . Asthma    . Chronic bronchitis (HCC)   . COPD (chronic obstructive pulmonary disease) (HCC)   . Cough   . Depression   . Esophagitis, reflux   . GERD (gastroesophageal reflux disease)   . Hyperlipidemia   . Hypothyroidism   . IBS (irritable bowel syndrome)   . Lumbago   . Muscle pain   . Osteoarthritis   . Sinus disorder   . Thyroid disease   . Type 2 diabetes, diet controlled (HCC)   . Uncomplicated herpes simplex     Past Surgical History:  Procedure Laterality Date  . ABDOMINAL HYSTERECTOMY    . CARPAL TUNNEL RELEASE Right   . LUMBAR LAMINECTOMY  03/16/2018   L4-5 Laminectomy & Fusion w/ Pedicle Screws, TLIF & Allograft; Surgeon: Virl Diamond, MD; Location: [facility] MAIN OR; Service: Orthopedics; Laterality: N/A; Prone, ProAxis, Stim Neuromonitoring, O-Arm, Cell Saver, [employer], [employer], Justin, PACS on [facility]    . OOPHORECTOMY    . ORIF TIBIA PLATEAU Left 03/10/2016   Procedure: OPEN REDUCTION INTERNAL FIXATION (ORIF) BICONDYLAR TIBIAL PLATEAU FRACTURE;  Surgeon: Eldred Manges, MD;  Location: [facility] OR;  Service: Orthopedics;  Laterality: Left;  . SHOULDER ARTHROSCOPY W/ ROTATOR CUFF REPAIR Right   . TONSILLECTOMY AND ADENOIDECTOMY      Social History   Tobacco Use  . Smoking status: Former Smoker    Packs/day: 2.00    Years: 30.00    Pack years: 60.00    Last attempt to quit: 09/03/2008    Years since quitting: 9.7  . Smokeless tobacco: Never Used  Substance Use  Topics  . Alcohol use: No     Current Outpatient Medications:  .  acyclovir (ZOVIRAX) 400 MG tablet, TAKE 1 TABLET(400 MG) BY MOUTH DAILY, Disp: 30 tablet, Rfl: 0 .  albuterol (PROVENTIL) (2.5 MG/3ML) 0.083% nebulizer solution, Take 3 mLs (2.5 mg total) by nebulization every 6 (six) hours as needed for wheezing or shortness of breath., Disp: 75 mL, Rfl: 2 .  buPROPion (WELLBUTRIN XL) 150 MG 24 hr tablet, TAKE 1 TABLET(150 MG) BY MOUTH DAILY, Disp: 90 tablet, Rfl: 0 .  Cholecalciferol (VITAMIN D PO), Take 1 capsule by  mouth daily., Disp: , Rfl:  .  diclofenac sodium (VOLTAREN) 1 % GEL, Apply 4 g topically 2 (two) times daily as needed., Disp: 2 Tube, Rfl: 1 .  DULoxetine (CYMBALTA) 30 MG capsule, Take 2 capsules (60 mg total) by mouth daily., Disp: 180 capsule, Rfl: 1 .  Fluticasone-Umeclidin-Vilant (TRELEGY ELLIPTA) 100-62.5-25 MCG/INH AEPB, Inhale 1 puff into the lungs daily., Disp: 60 each, Rfl: 5 .  levothyroxine (SYNTHROID, LEVOTHROID) 88 MCG tablet, TAKE 1 TABLET BY MOUTH DAILY BEFORE BREAKFAST, Disp: 90 tablet, Rfl: 0 .  montelukast (SINGULAIR) 10 MG tablet, One daily, Disp: 90 tablet, Rfl: 1 .  omeprazole (PRILOSEC) 40 MG capsule, TAKE 1 CAPSULE(40 MG) BY MOUTH DAILY, Disp: 90 capsule, Rfl: 0 .  polyethylene glycol powder (GLYCOLAX/MIRALAX) powder, Take 17 g by mouth 2 (two) times daily as needed., Disp: 3350 g, Rfl: 1 .  PROAIR HFA 108 (90 Base) MCG/ACT inhaler, INHALE 2 PUFFS INTO THE LUNGS EVERY 6 HOURS AS NEEDED FOR WHEEZING OR SHORTNESS OF BREATH, Disp: 8.5 g, Rfl: 0 .  rosuvastatin (CRESTOR) 20 MG tablet, Take 1 tablet (20 mg total) by mouth daily. In place of atorvastatin, Disp: 90 tablet, Rfl: 1 .  Semaglutide,0.25 or 0.5MG /DOS, (OZEMPIC, 0.25 OR 0.5 MG/DOSE,) 2 MG/1.5ML SOPN, Inject 0.5 ng into the skin once a week., Disp: 9 mL, Rfl: 0 .  traZODone (DESYREL) 100 MG tablet, Take 1-1.5 tablets (100-150 mg total) by mouth at bedtime., Disp: 135 tablet, Rfl: 1 .  fluocinolone (VANOS) 0.01 % cream, Apply topically 2 (two) times daily. (Patient not taking: Reported on 06/17/2018), Disp: 60 g, Rfl: 0 .  fluocinonide (LIDEX) 0.05 % external solution, APP EXT AA BID, Disp: , Rfl: 0 .  Magnesium Oxide 400 MG CAPS, Take 1 capsule (400 mg total) by mouth once. (Patient not taking: Reported on 06/17/2018), Disp: 30 capsule, Rfl: 5  No Known Allergies  ROS   No other specific complaints in a complete review of systems (except as listed in HPI above).  Objective  Vitals:   06/17/18 0916  BP: 90/60   Pulse: 99  Resp: 16  Temp: 98.2 F (36.8 C)  TempSrc: Oral  SpO2: 94%  Weight: 151 lb 3.2 oz (68.6 kg)  Height: 4\' 11"  (1.499 m)    Body mass index is 30.54 kg/m.  Nursing Note and Vital Signs reviewed.  Physical Exam  Constitutional: She is oriented to person, place, and time. She appears well-developed and well-nourished.  HENT:  Head: Normocephalic and atraumatic.  Mouth/Throat: Oropharynx is clear and moist.  Eyes: Pupils are equal, round, and reactive to light. EOM are normal.  Neck: Normal range of motion. Neck supple. Carotid bruit is not present.  Cardiovascular: Normal heart sounds and intact distal pulses.  Pulmonary/Chest: Effort normal and breath sounds normal.  Abdominal: There is no CVA tenderness.  Musculoskeletal: Normal range of motion.  Lymphadenopathy:    She has no cervical  adenopathy.  Neurological: She is alert and oriented to person, place, and time. She has normal strength. She displays normal reflexes. No cranial nerve deficit or sensory deficit. Coordination and gait normal. GCS eye subscore is 4. GCS verbal subscore is 5. GCS motor subscore is 6.  Skin: Skin is warm, dry and intact. Capillary refill takes less than 2 seconds.  Psychiatric: She has a normal mood and affect. Her speech is normal and behavior is normal. Judgment and thought content normal.  Vitals reviewed.     No results found for this or any previous visit (from the past 48 hour(s)).  Assessment & Plan  1. Acute intractable headache, unspecified headache type History of migraines, and strong family history, no red flag signs discussed with PCP, discussed warning signs with patient and ROC with increased frequency or new or worsening symptoms.   - ondansetron (ZOFRAN ODT) 4 MG disintegrating tablet; Take 1 tablet (4 mg total) by mouth every 8 (eight) hours as needed for nausea or vomiting.  Dispense: 20 tablet; Refill: 0 - SUMAtriptan (IMITREX) 25 MG tablet; May repeat in 2 hours  if headache persists or recurs. Do not take more than 3 in 24 hours.  Dispense: 10 tablet; Refill: 0

## 2018-07-12 ENCOUNTER — Telehealth: Payer: Self-pay | Admitting: Family Medicine

## 2018-07-12 DIAGNOSIS — E669 Obesity, unspecified: Principal | ICD-10-CM

## 2018-07-12 DIAGNOSIS — E1169 Type 2 diabetes mellitus with other specified complication: Secondary | ICD-10-CM

## 2018-07-12 DIAGNOSIS — Z1211 Encounter for screening for malignant neoplasm of colon: Secondary | ICD-10-CM

## 2018-07-12 NOTE — Telephone Encounter (Unsigned)
Copied from CRM [phone]. Topic: Quick Communication - See Telephone Encounter >> Jul 12, 2018 10:00 AM Floria RavelingStovall, Shana A wrote: CRM for notification. See Telephone encounter for: 07/12/18.  Pt called in and would like to talk to Dr Carlynn PurlSowles nurse.  She stated it was personal and would not give me any other info  Best number [phone]

## 2018-07-12 NOTE — Telephone Encounter (Signed)
Patient called reported that cologuard was damaged in shipping and would like another one shipped to her.

## 2018-07-18 ENCOUNTER — Other Ambulatory Visit: Payer: Self-pay | Admitting: Family Medicine

## 2018-07-18 ENCOUNTER — Other Ambulatory Visit: Payer: Self-pay | Admitting: Psychiatry

## 2018-07-18 DIAGNOSIS — E038 Other specified hypothyroidism: Secondary | ICD-10-CM

## 2018-07-18 DIAGNOSIS — K219 Gastro-esophageal reflux disease without esophagitis: Secondary | ICD-10-CM

## 2018-07-18 DIAGNOSIS — F331 Major depressive disorder, recurrent, moderate: Secondary | ICD-10-CM

## 2018-07-24 LAB — COLOGUARD: Cologuard: NEGATIVE

## 2018-07-26 ENCOUNTER — Other Ambulatory Visit: Payer: Self-pay

## 2018-07-26 ENCOUNTER — Encounter: Payer: Self-pay | Admitting: Family Medicine

## 2018-07-26 DIAGNOSIS — B001 Herpesviral vesicular dermatitis: Secondary | ICD-10-CM

## 2018-07-26 NOTE — Telephone Encounter (Signed)
Refill request for general medication. Zovirax to  [facility].   Last office visit: 06/17/2018  No follow-ups on file.

## 2018-07-27 MED ORDER — ACYCLOVIR 400 MG PO TABS: ORAL_TABLET | ORAL | 0 refills | Status: DC

## 2018-08-03 ENCOUNTER — Other Ambulatory Visit: Payer: Self-pay | Admitting: Family Medicine

## 2018-08-03 DIAGNOSIS — E1169 Type 2 diabetes mellitus with other specified complication: Secondary | ICD-10-CM

## 2018-08-03 DIAGNOSIS — E669 Obesity, unspecified: Principal | ICD-10-CM

## 2018-08-03 NOTE — Telephone Encounter (Signed)
She needs follow up within a month, sending one refill

## 2018-08-03 NOTE — Telephone Encounter (Signed)
[facility]   Last office visit pertaining to diabetes: 05/26/2018   Lab Results  Component Value Date   HGBA1C 5.4 02/03/2018      No follow-ups on file.

## 2018-09-08 ENCOUNTER — Encounter: Payer: Self-pay | Admitting: Family Medicine

## 2018-09-08 ENCOUNTER — Ambulatory Visit (INDEPENDENT_AMBULATORY_CARE_PROVIDER_SITE_OTHER): Payer: BLUE CROSS/BLUE SHIELD | Admitting: Family Medicine

## 2018-09-08 VITALS — BP 116/74 | HR 93 | Temp 97.9°F | Resp 18 | Ht 59.0 in | Wt 146.9 lb

## 2018-09-08 DIAGNOSIS — K219 Gastro-esophageal reflux disease without esophagitis: Secondary | ICD-10-CM

## 2018-09-08 DIAGNOSIS — F5105 Insomnia due to other mental disorder: Secondary | ICD-10-CM

## 2018-09-08 DIAGNOSIS — E038 Other specified hypothyroidism: Secondary | ICD-10-CM

## 2018-09-08 DIAGNOSIS — J3089 Other allergic rhinitis: Secondary | ICD-10-CM

## 2018-09-08 DIAGNOSIS — E669 Obesity, unspecified: Secondary | ICD-10-CM

## 2018-09-08 DIAGNOSIS — J449 Chronic obstructive pulmonary disease, unspecified: Secondary | ICD-10-CM

## 2018-09-08 DIAGNOSIS — J441 Chronic obstructive pulmonary disease with (acute) exacerbation: Secondary | ICD-10-CM

## 2018-09-08 DIAGNOSIS — F331 Major depressive disorder, recurrent, moderate: Secondary | ICD-10-CM

## 2018-09-08 DIAGNOSIS — E1169 Type 2 diabetes mellitus with other specified complication: Secondary | ICD-10-CM

## 2018-09-08 DIAGNOSIS — R110 Nausea: Secondary | ICD-10-CM

## 2018-09-08 DIAGNOSIS — E785 Hyperlipidemia, unspecified: Secondary | ICD-10-CM

## 2018-09-08 LAB — POCT GLYCOSYLATED HEMOGLOBIN (HGB A1C): HbA1c, POC (controlled diabetic range): 5.3 % (ref 0.0–7.0)

## 2018-09-08 MED ORDER — MONTELUKAST SODIUM 10 MG PO TABS: ORAL_TABLET | ORAL | 1 refills | Status: DC

## 2018-09-08 MED ORDER — HYDROCOD POLST-CPM POLST ER 10-8 MG/5ML PO SUER: 5.0000 mL | Freq: Two times a day (BID) | ORAL | 0 refills | Status: DC | PRN

## 2018-09-08 MED ORDER — FLUTICASONE-UMECLIDIN-VILANT 100-62.5-25 MCG/INH IN AEPB: 1.0000 | INHALATION_SPRAY | Freq: Every day | RESPIRATORY_TRACT | 5 refills | Status: DC

## 2018-09-08 MED ORDER — DULOXETINE HCL 60 MG PO CPEP: 60.0000 mg | ORAL_CAPSULE | Freq: Every day | ORAL | 1 refills | Status: DC

## 2018-09-08 MED ORDER — PREDNISONE 10 MG PO TABS: 10.0000 mg | ORAL_TABLET | Freq: Every day | ORAL | 0 refills | Status: DC

## 2018-09-08 MED ORDER — BUPROPION HCL ER (XL) 150 MG PO TB24: 150.0000 mg | ORAL_TABLET | Freq: Every day | ORAL | 1 refills | Status: DC

## 2018-09-08 MED ORDER — OMEPRAZOLE 40 MG PO CPDR: DELAYED_RELEASE_CAPSULE | ORAL | 0 refills | Status: DC

## 2018-09-08 MED ORDER — ONDANSETRON 4 MG PO TBDP: 4.0000 mg | ORAL_TABLET | Freq: Three times a day (TID) | ORAL | 0 refills | Status: DC | PRN

## 2018-09-08 MED ORDER — LEVOTHYROXINE SODIUM 88 MCG PO TABS: 88.0000 ug | ORAL_TABLET | Freq: Every day | ORAL | 1 refills | Status: DC

## 2018-09-08 MED ORDER — TRAZODONE HCL 100 MG PO TABS: 100.0000 mg | ORAL_TABLET | Freq: Every day | ORAL | 1 refills | Status: DC

## 2018-09-08 NOTE — Progress Notes (Signed)
Name: Sara Andrews   MRN: [id number]    DOB: 12-21-1955   Date:09/08/2018       Progress Note  Subjective  Chief Complaint  Chief Complaint  Patient presents with  . Medication Refill  . Diabetes  . Hypertension    Denies any symptoms  . Hypothyroidism  . Depression  . DDD  . Head Cold    Onset-1 week, cough syrup helped but the cough is still there. Stomach upsetness, sinus pressure and runny nose. Has been taking Allergy medications and helped dry out her sinuses. Coughing up clear mucus that now has turned yellow. Headaches  . Nausea    HPI  DDD andspondylolisthesis: doing well since surgery done 02/2018. Doing well, off pain medication, taking prn tylenol only and since pain is under control, stable   COPD.acute exacerbation: last flare was 01/2018, currently on Trelegy, developed symptoms of URI over the weekend, but now having mostly a productive cough, yellow sputum, no SOB but has been using neb treatment more often. . She quit smoking in 2009.We will give her prednisone taper   GERD: she is back on omeprazole daily , no heartburn or regurgitation, she states nausea has been worse lately. We will stop Ozempic today and monitor    Hypothyroidism: reviewed labs and TSHdone 01/2018 at goal, continue current dose,she states constipation is stable. No hair loss or constipation. Continue current dose   DM II : diagnosed at work back in 2012she used to take medications, A1C at goal.She denies polyphagia, polyuria , she has polydipsia. Started on Trulicity June 2018, having some constipation and indigestion, we switched to Ozempic 10/2018weight was 198 lbs and is down to 146.9 lbs. We will stop medication today to see if nausea resolves . On statin for dyslipidemia   Major Depression: she states she has been taking medication, seeing Dr. Elna BreslowEappen. She is feeling better, she states she does not need to go back there anymore  Chronic nausea: going on for months,  we will stop ozempic, if continues she will let us know so we can check labs.   Patient Active Problem List   Diagnosis Date Noted  . Spondylosis of lumbar spine 11/06/2016  . Spinal stenosis of lumbar region with neurogenic claudication 10/28/2016  . Chronic bilateral low back pain with bilateral sciatica 09/16/2016  . Hyperglycemia 08/07/2016  . Moderate episode of recurrent major depressive disorder (HCC) 08/07/2016  . Pain of left heel 06/02/2016  . Closed fracture of lateral portion of left tibial plateau 03/10/2016  . Degenerative cervical disc 03/04/2016  . Gastroesophageal reflux disease without esophagitis 02/01/2016  . Primary osteoarthritis of right hand 02/01/2016  . Paresthesias in left hand 02/01/2016  . SOB (shortness of breath) 10/05/2013  . COPD (chronic obstructive pulmonary disease) (HCC) 10/05/2013  . History of smoking 10/05/2013  . Hyperlipidemia 10/05/2013  . Hypothyroidism 10/05/2013    Past Surgical History:  Procedure Laterality Date  . ABDOMINAL HYSTERECTOMY    . CARPAL TUNNEL RELEASE Right   . LUMBAR LAMINECTOMY  03/16/2018   L4-5 Laminectomy & Fusion w/ Pedicle Screws, TLIF & Allograft; Surgeon: Virl DiamondMax William Cohen, MD; Location: [facility] MAIN OR; Service: Orthopedics; Laterality: N/A; Prone, ProAxis, Stim Neuromonitoring, O-Arm, Cell Saver, [employer], [employer], Justin, PACS on [facility]    . OOPHORECTOMY    . ORIF TIBIA PLATEAU Left 03/10/2016   Procedure: OPEN REDUCTION INTERNAL FIXATION (ORIF) BICONDYLAR TIBIAL PLATEAU FRACTURE;  Surgeon: Eldred MangesMark C Yates, MD;  Location: [facility] OR;  Service: Orthopedics;  Laterality:  Left;  . SHOULDER ARTHROSCOPY W/ ROTATOR CUFF REPAIR Right   . TONSILLECTOMY AND ADENOIDECTOMY      Family History  Problem Relation Age of Onset  . Hypertension Daughter   . Breast cancer Daughter   . Diabetes Father   . Heart disease Father   . Breast cancer Maternal Aunt 60  . Breast cancer Paternal Aunt 46  . Depression Sister   . Alcohol  abuse Sister   . Anxiety disorder Sister   . Bipolar disorder Sister   . Pneumonia Sister   . Skin cancer Daughter   . Anxiety disorder Daughter     Social History   Socioeconomic History  . Marital status: Widowed    Spouse name: Not on file  . Number of children: 3  . Years of education: Not on file  . Highest education level: 10th grade  Occupational History  . Occupation: disabled    Comment: retired  Engineer, production  . Financial resource strain: Not hard at all  . Food insecurity:    Worry: Never true    Inability: Never true  . Transportation needs:    Medical: No    Non-medical: No  Tobacco Use  . Smoking status: Former Smoker    Packs/day: 2.00    Years: 30.00    Pack years: 60.00    Last attempt to quit: 09/03/2008    Years since quitting: 10.0  . Smokeless tobacco: Never Used  Substance and Sexual Activity  . Alcohol use: No  . Drug use: No  . Sexual activity: Never  Lifestyle  . Physical activity:    Days per week: 0 days    Minutes per session: 0 min  . Stress: Not at all  Relationships  . Social connections:    Talks on phone: Twice a week    Gets together: Never    Attends religious service: More than 4 times per year    Active member of club or organization: No    Attends meetings of clubs or organizations: Never    Relationship status: Widowed  . Intimate partner violence:    Fear of current or ex partner: No    Emotionally abused: No    Physically abused: No    Forced sexual activity: No  Other Topics Concern  . Not on file  Social History Narrative   She used to work at  [employer], but had a left knee work related injury 03/10/2016 , require left knee surgery and is now having back pain, that is getting evaluated, Out of work since.       Patient recently found out that one of her twins Sara Andrews) was diagnosed with breast cancer); will be having a double mastectomy soon.        Current Outpatient Medications:  .  acyclovir (ZOVIRAX)  400 MG tablet, Take one tablet by mouth daily, Disp: 30 tablet, Rfl: 0 .  albuterol (PROVENTIL) (2.5 MG/3ML) 0.083% nebulizer solution, Take 3 mLs (2.5 mg total) by nebulization every 6 (six) hours as needed for wheezing or shortness of breath., Disp: 75 mL, Rfl: 2 .  buPROPion (WELLBUTRIN XL) 150 MG 24 hr tablet, TAKE 1 TABLET(150 MG) BY MOUTH DAILY, Disp: 90 tablet, Rfl: 0 .  Cholecalciferol (VITAMIN D PO), Take 1 capsule by mouth daily., Disp: , Rfl:  .  diclofenac sodium (VOLTAREN) 1 % GEL, Apply 4 g topically 2 (two) times daily as needed., Disp: 2 Tube, Rfl: 1 .  DULoxetine (CYMBALTA) 30 MG capsule,  Take 2 capsules (60 mg total) by mouth daily., Disp: 180 capsule, Rfl: 1 .  fluocinonide (LIDEX) 0.05 % external solution, APP EXT AA BID, Disp: , Rfl: 0 .  Fluticasone-Umeclidin-Vilant (TRELEGY ELLIPTA) 100-62.5-25 MCG/INH AEPB, Inhale 1 puff into the lungs daily., Disp: 60 each, Rfl: 5 .  levothyroxine (SYNTHROID, LEVOTHROID) 88 MCG tablet, TAKE 1 TABLET BY MOUTH DAILY BEFORE BREAKFAST, Disp: 90 tablet, Rfl: 0 .  Magnesium Oxide 400 MG CAPS, Take 1 capsule (400 mg total) by mouth once., Disp: 30 capsule, Rfl: 5 .  montelukast (SINGULAIR) 10 MG tablet, One daily, Disp: 90 tablet, Rfl: 1 .  omeprazole (PRILOSEC) 40 MG capsule, TAKE 1 CAPSULE(40 MG) BY MOUTH DAILY, Disp: 90 capsule, Rfl: 0 .  ondansetron (ZOFRAN ODT) 4 MG disintegrating tablet, Take 1 tablet (4 mg total) by mouth every 8 (eight) hours as needed for nausea or vomiting., Disp: 20 tablet, Rfl: 0 .  OZEMPIC, 0.25 OR 0.5 MG/DOSE, 2 MG/1.5ML SOPN, INJECT 0.5MG  UNDER THE SKIN ONCE A WEEK., Disp: 9 mL, Rfl: 0 .  polyethylene glycol powder (GLYCOLAX/MIRALAX) powder, Take 17 g by mouth 2 (two) times daily as needed., Disp: 3350 g, Rfl: 1 .  PROAIR HFA 108 (90 Base) MCG/ACT inhaler, INHALE 2 PUFFS INTO THE LUNGS EVERY 6 HOURS AS NEEDED FOR WHEEZING OR SHORTNESS OF BREATH, Disp: 8.5 g, Rfl: 0 .  rosuvastatin (CRESTOR) 20 MG tablet, Take 1 tablet  (20 mg total) by mouth daily. In place of atorvastatin, Disp: 90 tablet, Rfl: 1 .  SUMAtriptan (IMITREX) 25 MG tablet, May repeat in 2 hours if headache persists or recurs. Do not take more than 3 in 24 hours., Disp: 10 tablet, Rfl: 0 .  traZODone (DESYREL) 100 MG tablet, Take 1-1.5 tablets (100-150 mg total) by mouth at bedtime., Disp: 135 tablet, Rfl: 1 .  fluocinolone (VANOS) 0.01 % cream, Apply topically 2 (two) times daily. (Patient not taking: Reported on 09/08/2018), Disp: 60 g, Rfl: 0  No Known Allergies  I personally reviewed active problem list, medication list, allergies, family history, social history with the patient/caregiver today.   ROS  Constitutional: Negative for fever, positive for  weight change.  Respiratory: Positive  for cough and shortness of breath.   Cardiovascular: Negative for chest pain or palpitations.  Gastrointestinal: Negative for abdominal pain, no bowel changes. She has nausea Musculoskeletal: Negative for gait problem or joint swelling.  Skin: Negative for rash.  Neurological: Negative for dizziness or headache.  No other specific complaints in a complete review of systems (except as listed in HPI above).  Objective  Vitals:   09/08/18 1104  BP: 116/74  Pulse: 93  Resp: 18  Temp: 97.9 F (36.6 C)  TempSrc: Oral  SpO2: 99%  Weight: 146 lb 14.4 oz (66.6 kg)  Height: 4\' 11"  (1.499 m)    Body mass index is 29.67 kg/m.  Physical Exam  Constitutional: Patient appears well-developed and well-nourished. Obese  No distress.  HEENT: head atraumatic, normocephalic, pupils equal and reactive to light,  neck supple, throat within normal limits Cardiovascular: Normal rate, regular rhythm and normal heart sounds.  No murmur heard. No BLE edema. Pulmonary/Chest: Effort normal and rhonchi. No respiratory distress. Abdominal: Soft.  There is no tenderness. Psychiatric: Patient has a normal mood and affect. behavior is normal. Judgment and thought  content normal.  Recent Results (from the past 2160 hour(s))  Cologuard     Status: None   Collection Time: 07/22/18 12:00 AM  Result Value Ref Range  Cologuard Negative       PHQ2/9: Depression screen [facility] 2/9 09/08/2018 06/17/2018 05/26/2018 02/03/2018 10/28/2017  Decreased Interest 0 0 0 1 2  Down, Depressed, Hopeless 0 0 0 - 1  PHQ - 2 Score 0 0 0 1 3  Altered sleeping 1 1 - 0 1  Tired, decreased energy 1 0 - 2 1  Change in appetite 0 1 - 0 0  Feeling bad or failure about yourself  0 0 - 0 0  Trouble concentrating 0 0 - 0 0  Moving slowly or fidgety/restless 0 0 - 0 1  Suicidal thoughts 0 0 - - 0  PHQ-9 Score 2 2 - 3 6  Difficult doing work/chores Not difficult at all Not difficult at all - Somewhat difficult Not difficult at all     Fall Risk: Fall Risk  09/08/2018 06/17/2018 05/26/2018 02/03/2018 10/28/2017  Falls in the past year? 1 0 No Yes No  Number falls in past yr: 0 - - 1 -  Injury with Fall? 1 - - Yes -  Comment Hurt her right side-around Christmas fell on her deck - - Right Ankle -  Follow up - - - - -     Functional Status Survey: Is the patient deaf or have difficulty hearing?: No Does the patient have difficulty seeing, even when wearing glasses/contacts?: Yes Does the patient have difficulty concentrating, remembering, or making decisions?: No Does the patient have difficulty walking or climbing stairs?: No Does the patient have difficulty dressing or bathing?: No Does the patient have difficulty doing errands alone such as visiting a doctor's office or shopping?: No    Assessment & Plan  1. Diabetes mellitus type 2 in obese (HCC)  - POCT HgB A1C  2. COPD with acute exacerbation (HCC)   3. Moderate episode of recurrent major depressive disorder (HCC)  No longer seeing Dr. Elna Breslow but needs refill of medication   4. Gastroesophageal reflux disease without esophagitis  Continue medication   5. Dyslipidemia  On statin   6. Chronic  obstructive pulmonary disease, unspecified COPD type (HCC)  - Fluticasone-Umeclidin-Vilant (TRELEGY ELLIPTA) 100-62.5-25 MCG/INH AEPB; Inhale 1 puff into the lungs daily.  Dispense: 60 each; Refill: 5 - predniSONE (DELTASONE) 10 MG tablet; Take 1 tablet (10 mg total) by mouth daily with breakfast.  Dispense: 10 tablet; Refill: 0 - chlorpheniramine-HYDROcodone (TUSSIONEX PENNKINETIC ER) 10-8 MG/5ML SUER; Take 5 mLs by mouth every 12 (twelve) hours as needed.  Dispense: 140 mL; Refill: 0  7. Other allergic rhinitis  - montelukast (SINGULAIR) 10 MG tablet; One daily  Dispense: 90 tablet; Refill: 1   8. Chronic nausea  Stop Ozempic, having a lot of nausea, if symptoms persists she will call back for labs - ondansetron (ZOFRAN ODT) 4 MG disintegrating tablet; Take 1 tablet (4 mg total) by mouth every 8 (eight) hours as needed for nausea or vomiting.  Dispense: 20 tablet; Refill: 0  10. Other specified hypothyroidism  - levothyroxine (SYNTHROID, LEVOTHROID) 88 MCG tablet; Take 1 tablet (88 mcg total) by mouth daily before breakfast.  Dispense: 90 tablet; Refill: 1  11. Insomnia due to mental disorder  - traZODone (DESYREL) 100 MG tablet; Take 1 tablet (100 mg total) by mouth at bedtime.  Dispense: 90 tablet; Refill: 1

## 2018-09-10 ENCOUNTER — Other Ambulatory Visit: Payer: Self-pay | Admitting: Psychiatry

## 2018-09-10 DIAGNOSIS — F331 Major depressive disorder, recurrent, moderate: Secondary | ICD-10-CM

## 2018-09-13 NOTE — Telephone Encounter (Signed)
Patient's cymbalta was recently filled by PMD. Pt needs to be seen for future refills .

## 2018-10-12 ENCOUNTER — Other Ambulatory Visit: Payer: Self-pay | Admitting: Family Medicine

## 2018-10-12 DIAGNOSIS — E785 Hyperlipidemia, unspecified: Secondary | ICD-10-CM

## 2018-10-29 ENCOUNTER — Telehealth: Payer: Self-pay | Admitting: Family Medicine

## 2018-10-29 NOTE — Telephone Encounter (Signed)
Patient called back to check on status of earlier message. She states that the itching on both sides of her head is very bad and she thing its her nerves. Said her daughter checked her for lice and its not that. Please advise Ph# [phone]

## 2018-10-29 NOTE — Telephone Encounter (Signed)
Copied from CRM [id number]. Topic: Quick Communication - Rx Refill/Question >> Oct 29, 2018  8:24 AM Fanny Bien wrote: Medication: DULoxetine (CYMBALTA) 60 MG capsule [[id number]] pt called and stated that she took 120 mg and felt like she did so much better. Pt would like a call back regarding.  Fluticasone-Umeclidin-Vilant (TRELEGY ELLIPTA) 100-62.5-25 MCG/INH AEPB [[id number]]  pt states that this medication is not working.

## 2018-11-01 ENCOUNTER — Other Ambulatory Visit: Payer: Self-pay

## 2018-11-01 ENCOUNTER — Encounter: Payer: Self-pay | Admitting: Family Medicine

## 2018-11-01 ENCOUNTER — Ambulatory Visit (INDEPENDENT_AMBULATORY_CARE_PROVIDER_SITE_OTHER): Payer: BLUE CROSS/BLUE SHIELD | Admitting: Family Medicine

## 2018-11-01 VITALS — Ht 59.0 in | Wt 140.2 lb

## 2018-11-01 DIAGNOSIS — L299 Pruritus, unspecified: Secondary | ICD-10-CM | POA: Diagnosis not present

## 2018-11-01 DIAGNOSIS — F331 Major depressive disorder, recurrent, moderate: Secondary | ICD-10-CM | POA: Diagnosis not present

## 2018-11-01 MED ORDER — HYDROXYZINE HCL 10 MG PO TABS: 10.0000 mg | ORAL_TABLET | Freq: Three times a day (TID) | ORAL | 0 refills | Status: DC | PRN

## 2018-11-01 MED ORDER — TRIAMCINOLONE ACETONIDE 0.1 % EX LOTN: 1.0000 "application " | TOPICAL_LOTION | Freq: Three times a day (TID) | CUTANEOUS | 0 refills | Status: DC

## 2018-11-01 NOTE — Progress Notes (Signed)
Name: Sara Andrews   MRN: [id number]    DOB: [birth year]   Date:11/01/2018       Progress Note  Subjective  Chief Complaint  Chief Complaint  Patient presents with  . Head Itchy    Onset-4 weeks, has been putting cream on her scalp and has not been working.  Daughter's have been checking her head and didn't see any lice or anything concerning.    I connected with@ on 11/01/18 at 11:20 AM EDT by a video enabled telemedicine application and verified that I am speaking with the correct person using two identifiers.  I discussed the limitations of evaluation and management by telemedicine and the availability of in person appointments. The patient expressed understanding and agreed to proceed. Staff also discussed with the patient that there may be a patient responsible charge related to this service. Patient Location: home  Provider Location: [facility]   HPI  Pruritus scalp: going on for the past 4 weeks, daughter checked form lice and could not find anything, she states it looks like redness is present because of scratching. No fever or chills, she states seems worse at night. No other rashes.   MDD: medication seems to be working, however upset about not being able to get out of the house, feels anxious. Her daughters are going groceries shopping for her. She has not been out of the house except to visit her daughter's and is feeling trapped .   Patient Active Problem List   Diagnosis Date Noted  . Spondylosis of lumbar spine 11/06/2016  . Spinal stenosis of lumbar region with neurogenic claudication 10/28/2016  . Chronic bilateral low back pain with bilateral sciatica 09/16/2016  . Hyperglycemia 08/07/2016  . Moderate episode of recurrent major depressive disorder (HCC) 08/07/2016  . Pain of left heel 06/02/2016  . Degenerative cervical disc 03/04/2016  . Gastroesophageal reflux disease without esophagitis 02/01/2016  . Primary osteoarthritis of right hand  02/01/2016  . Paresthesias in left hand 02/01/2016  . SOB (shortness of breath) 10/05/2013  . COPD (chronic obstructive pulmonary disease) (HCC) 10/05/2013  . History of smoking 10/05/2013  . Hyperlipidemia 10/05/2013  . Adult onset hypothyroidism 10/05/2013    Past Surgical History:  Procedure Laterality Date  . ABDOMINAL HYSTERECTOMY    . CARPAL TUNNEL RELEASE Right   . LUMBAR LAMINECTOMY  03/16/2018   L4-5 Laminectomy & Fusion w/ Pedicle Screws, TLIF & Allograft; Surgeon: Virl Diamond, MD; Location: [facility] MAIN OR; Service: Orthopedics; Laterality: N/A; Prone, ProAxis, Stim Neuromonitoring, O-Arm, Cell Saver, [employer], [employer], Justin, PACS on [facility]    . OOPHORECTOMY    . ORIF TIBIA PLATEAU Left 03/10/2016   Procedure: OPEN REDUCTION INTERNAL FIXATION (ORIF) BICONDYLAR TIBIAL PLATEAU FRACTURE;  Surgeon: Eldred Manges, MD;  Location: [facility] OR;  Service: Orthopedics;  Laterality: Left;  . SHOULDER ARTHROSCOPY W/ ROTATOR CUFF REPAIR Right   . TONSILLECTOMY AND ADENOIDECTOMY      Family History  Problem Relation Age of Onset  . Hypertension Daughter   . Breast cancer Daughter   . Diabetes Father   . Heart disease Father   . Breast cancer Maternal Aunt 60  . Breast cancer Paternal Aunt 35  . Depression Sister   . Alcohol abuse Sister   . Anxiety disorder Sister   . Bipolar disorder Sister   . Pneumonia Sister   . Skin cancer Daughter   . Anxiety disorder Daughter     Social History   Socioeconomic History  .  Marital status: Widowed    Spouse name: Not on file  . Number of children: 3  . Years of education: Not on file  . Highest education level: 10th grade  Occupational History  . Occupation: disabled    Comment: retired  Engineer, production  . Financial resource strain: Not hard at all  . Food insecurity:    Worry: Never true    Inability: Never true  . Transportation needs:    Medical: No    Non-medical: No  Tobacco Use  . Smoking status: Former Smoker     Packs/day: 2.00    Years: 30.00    Pack years: 60.00    Last attempt to quit: 09/03/2008    Years since quitting: 10.1  . Smokeless tobacco: Never Used  Substance and Sexual Activity  . Alcohol use: No  . Drug use: No  . Sexual activity: Never  Lifestyle  . Physical activity:    Days per week: 0 days    Minutes per session: 0 min  . Stress: Not at all  Relationships  . Social connections:    Talks on phone: Twice a week    Gets together: Never    Attends religious service: More than 4 times per year    Active member of club or organization: No    Attends meetings of clubs or organizations: Never    Relationship status: Widowed  . Intimate partner violence:    Fear of current or ex partner: No    Emotionally abused: No    Physically abused: No    Forced sexual activity: No  Other Topics Concern  . Not on file  Social History Narrative   She used to work at  [employer], but had a left knee work related injury 03/10/2016 , require left knee surgery and is now having back pain, that is getting evaluated, Out of work since.       Patient recently found out that one of her twins [name]) was diagnosed with breast cancer); will be having a double mastectomy soon.        Current Outpatient Medications:  .  acyclovir (ZOVIRAX) 400 MG tablet, Take one tablet by mouth daily, Disp: 30 tablet, Rfl: 0 .  albuterol (PROVENTIL) (2.5 MG/3ML) 0.083% nebulizer solution, Take 3 mLs (2.5 mg total) by nebulization every 6 (six) hours as needed for wheezing or shortness of breath., Disp: 75 mL, Rfl: 2 .  baclofen (LIORESAL) 10 MG tablet, TK 1 T PO TID, Disp: , Rfl:  .  buPROPion (WELLBUTRIN XL) 150 MG 24 hr tablet, Take 1 tablet (150 mg total) by mouth daily., Disp: 90 tablet, Rfl: 1 .  Cholecalciferol (VITAMIN D PO), Take 1 capsule by mouth daily., Disp: , Rfl:  .  DULoxetine (CYMBALTA) 60 MG capsule, Take 1 capsule (60 mg total) by mouth daily., Disp: 90 capsule, Rfl: 1 .  fluocinonide  (LIDEX) 0.05 % external solution, APP EXT AA BID, Disp: , Rfl: 0 .  Fluticasone-Umeclidin-Vilant (TRELEGY ELLIPTA) 100-62.5-25 MCG/INH AEPB, Inhale 1 puff into the lungs daily., Disp: 60 each, Rfl: 5 .  levothyroxine (SYNTHROID, LEVOTHROID) 88 MCG tablet, Take 1 tablet (88 mcg total) by mouth daily before breakfast., Disp: 90 tablet, Rfl: 1 .  montelukast (SINGULAIR) 10 MG tablet, One daily, Disp: 90 tablet, Rfl: 1 .  omeprazole (PRILOSEC) 40 MG capsule, TAKE 1 CAPSULE(40 MG) BY MOUTH DAILY, Disp: 90 capsule, Rfl: 0 .  OZEMPIC, 0.25 OR 0.5 MG/DOSE, 2 MG/1.5ML SOPN, INJ 0.5 MG [place]  ONCE A WEEK, Disp: , Rfl:  .  polyethylene glycol powder (GLYCOLAX/MIRALAX) powder, Take 17 g by mouth 2 (two) times daily as needed., Disp: 3350 g, Rfl: 1 .  PROAIR HFA 108 (90 Base) MCG/ACT inhaler, INHALE 2 PUFFS INTO THE LUNGS EVERY 6 HOURS AS NEEDED FOR WHEEZING OR SHORTNESS OF BREATH, Disp: 8.5 g, Rfl: 0 .  rosuvastatin (CRESTOR) 20 MG tablet, Take 1 tablet (20 mg total) by mouth daily. In place of atorvastatin, Disp: 90 tablet, Rfl: 1 .  chlorpheniramine-HYDROcodone (TUSSIONEX PENNKINETIC ER) 10-8 MG/5ML SUER, Take 5 mLs by mouth every 12 (twelve) hours as needed. (Patient not taking: Reported on 11/01/2018), Disp: 140 mL, Rfl: 0 .  diclofenac sodium (VOLTAREN) 1 % GEL, Apply 4 g topically 2 (two) times daily as needed. (Patient not taking: Reported on 11/01/2018), Disp: 2 Tube, Rfl: 1 .  Magnesium Oxide 400 MG CAPS, Take 1 capsule (400 mg total) by mouth once. (Patient not taking: Reported on 11/01/2018), Disp: 30 capsule, Rfl: 5 .  ondansetron (ZOFRAN ODT) 4 MG disintegrating tablet, Take 1 tablet (4 mg total) by mouth every 8 (eight) hours as needed for nausea or vomiting. (Patient not taking: Reported on 11/01/2018), Disp: 20 tablet, Rfl: 0 .  predniSONE (DELTASONE) 10 MG tablet, Take 1 tablet (10 mg total) by mouth daily with breakfast. (Patient not taking: Reported on 11/01/2018), Disp: 10 tablet, Rfl: 0 .  SUMAtriptan  (IMITREX) 25 MG tablet, May repeat in 2 hours if headache persists or recurs. Do not take more than 3 in 24 hours. (Patient not taking: Reported on 11/01/2018), Disp: 10 tablet, Rfl: 0 .  traZODone (DESYREL) 100 MG tablet, Take 1 tablet (100 mg total) by mouth at bedtime. (Patient not taking: Reported on 11/01/2018), Disp: 90 tablet, Rfl: 1  No Known Allergies  I personally reviewed active problem list, medication list, allergies, family history, social history with the patient/caregiver today.   ROS  Ten systems reviewed and is negative except as mentioned in HPI   Objective  Virtual encounter, vitals not obtained.  Body mass index is 28.32 kg/m.  Physical Exam  Awake and alert, oriented, scratching her head but I was unable to see the rash   PHQ2/9: Depression screen [facility] 2/9 11/01/2018 09/08/2018 06/17/2018 05/26/2018 02/03/2018  Decreased Interest 1 0 0 0 1  Down, Depressed, Hopeless 0 0 0 0 -  PHQ - 2 Score 1 0 0 0 1  Altered sleeping 0 1 1 - 0  Tired, decreased energy 0 1 0 - 2  Change in appetite 0 0 1 - 0  Feeling bad or failure about yourself  1 0 0 - 0  Trouble concentrating 0 0 0 - 0  Moving slowly or fidgety/restless 0 0 0 - 0  Suicidal thoughts 0 0 0 - -  PHQ-9 Score 2 2 2  - 3  Difficult doing work/chores Not difficult at all Not difficult at all Not difficult at all - Somewhat difficult  Some recent data might be hidden   PHQ-2/9 Result is positive.   GAD 7 : Generalized Anxiety Score 11/01/2018 09/08/2018 10/28/2017  Nervous, Anxious, on Edge 1 0 0  Control/stop worrying 0 0 -  Worry too much - different things 0 0 1  Trouble relaxing 0 0 1  Restless 0 0 0  Easily annoyed or irritable 3 0 0  Afraid - awful might happen 0 0 0  Total GAD 7 Score 4 0 -  Anxiety Difficulty Not difficult at all  Not difficult at all -     Fall Risk: Fall Risk  11/01/2018 09/08/2018 06/17/2018 05/26/2018 02/03/2018  Falls in the past year? 0 1 0 No Yes  Number falls in past yr: 0 0 - - 1   Injury with Fall? 0 1 - - Yes  Comment - Hurt her right side-around Christmas fell on her deck - - Right Ankle  Follow up - - - - -   GAD 7 : Generalized Anxiety Score 09/08/2018 10/28/2017  Nervous, Anxious, on Edge 0 0  Control/stop worrying 0 -  Worry too much - different things 0 1  Trouble relaxing 0 1  Restless 0 0  Easily annoyed or irritable 0 0  Afraid - awful might happen 0 0  Total GAD 7 Score 0 -  Anxiety Difficulty Not difficult at all -     Assessment & Plan  1. Pruritus of scalp  - triamcinolone lotion (KENALOG) 0.1 %; Apply 1 application topically 3 (three) times daily.  Dispense: 60 mL; Refill: 0 - hydrOXYzine (ATARAX/VISTARIL) 10 MG tablet; Take 1-2 tablets (10-20 mg total) by mouth 3 (three) times daily as needed.  Dispense: 90 tablet; Refill: 0  2. MDD (major depressive disorder), recurrent episode, moderate (HCC)  Feeling upset since she cannot leave her house secondary to COVID-19 however phq 9 and GAD normal.   I discussed the assessment and treatment plan with the patient. The patient was provided an opportunity to ask questions and all were answered. The patient agreed with the plan and demonstrated an understanding of the instructions.  The patient was advised to call back or seek an in-person evaluation if the symptoms worsen or if the condition fails to improve as anticipated.  I provided 16  minutes of non-face-to-face time during this encounter.

## 2018-11-21 ENCOUNTER — Other Ambulatory Visit: Payer: Self-pay | Admitting: Family Medicine

## 2018-11-21 DIAGNOSIS — E785 Hyperlipidemia, unspecified: Secondary | ICD-10-CM

## 2019-01-01 IMAGING — MR MR LUMBAR SPINE W/O CM
4 of 5 series · 25 of 48 positions shown · non-contrast
Comparison: Plain films lumbar spine performed at [REDACTED] 08/05/2016.

CLINICAL DATA: Low back pain for 1.5 months which is worse with
standing and walking. Bilateral buttock and upper leg pain. History
of fall from a ladder in February 2016.

EXAM:
MRI LUMBAR SPINE WITHOUT CONTRAST
TECHNIQUE: Multiplanar, multisequence MR imaging of the lumbar spine was
performed. No intravenous contrast was administered.

[Series 2: T2 · sagittal · 4.0mm · 0.81mm/px · 6 of 15 slices shown (1 of 2)]
[im 1/15]
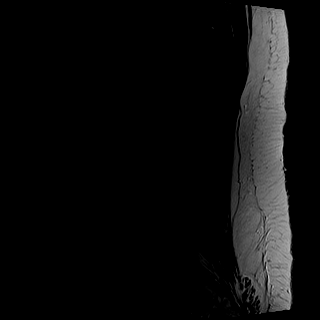
[im 3/15]
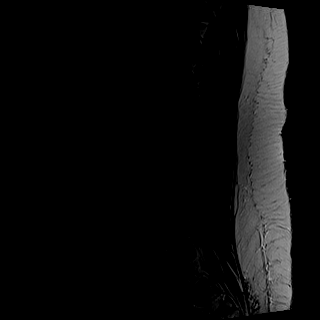
[im 6/15]
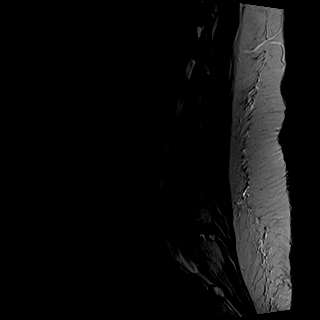
[im 9/15]
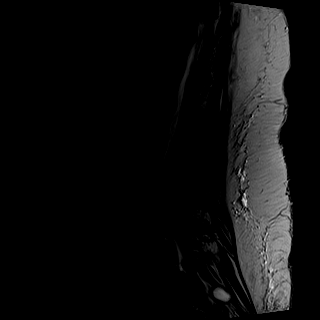
[im 12/15]
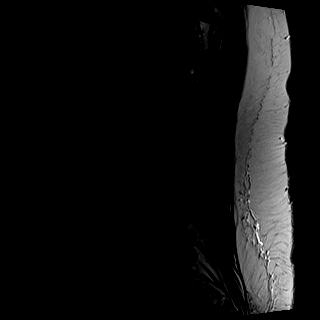
[im 15/15]
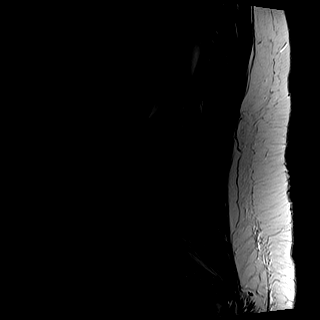

[Series 3: T1 · sagittal · 4.0mm · 0.41mm/px · 5 of 15 slices shown (1 of 2)]
[im 1/15]
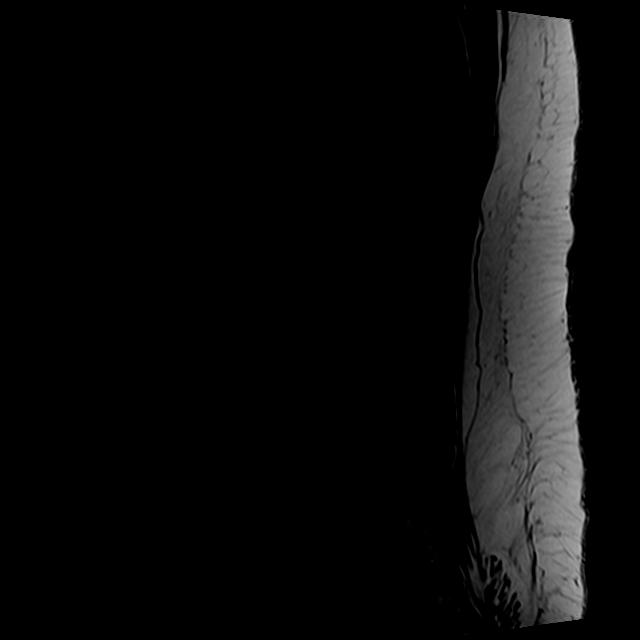
[im 4/15]
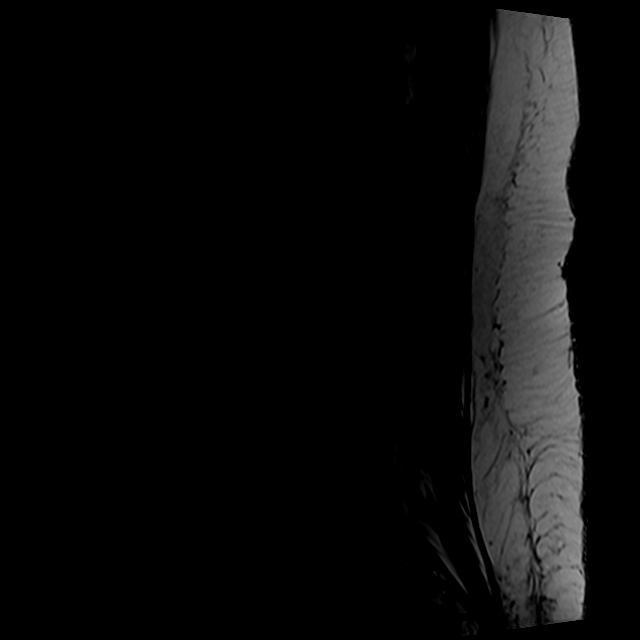
[im 8/15]
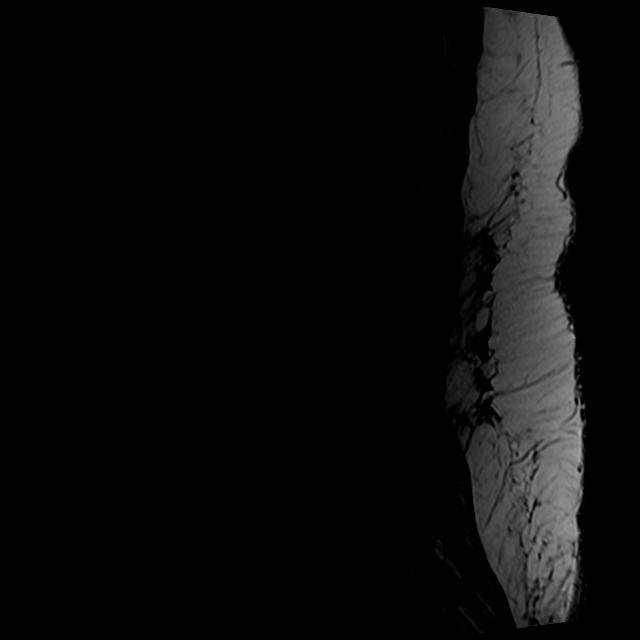
[im 11/15]
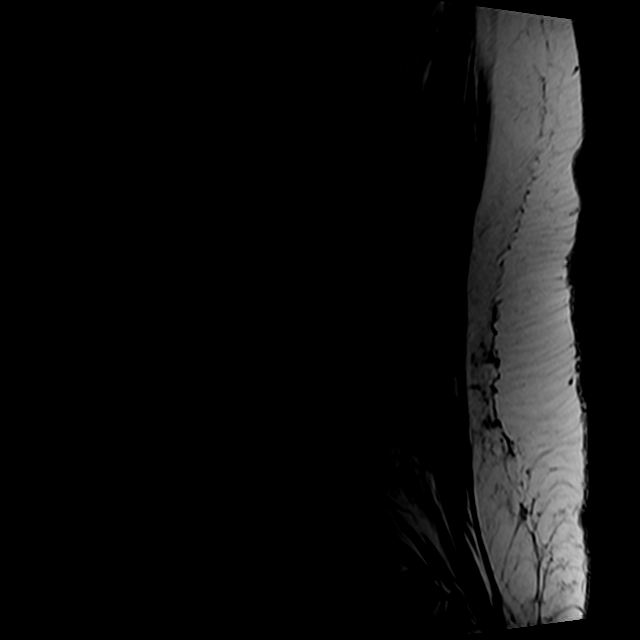
[im 15/15]
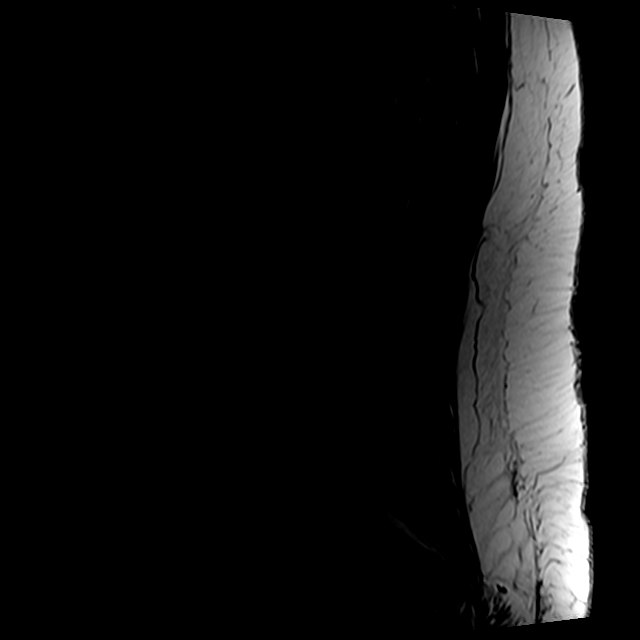

[Series 5: T2 · axial · 4.0mm · 0.78mm/px · z∈[-42,+189]mm · 10 of 44 slices shown (2 of 2)]
[im 3/44]
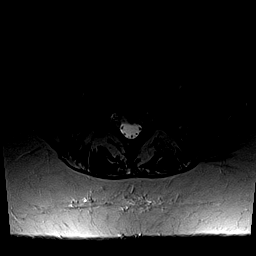
[im 6/44]
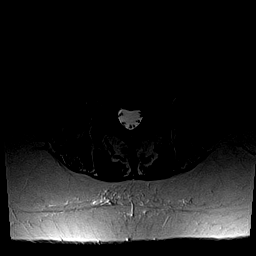
[im 9/44]
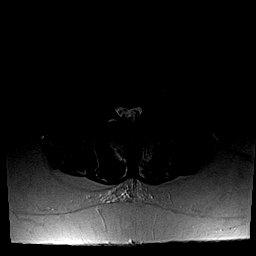
[im 15/44]
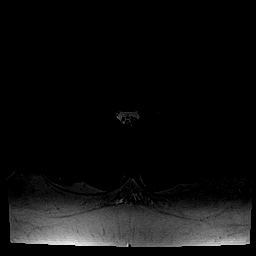
[im 21/44]
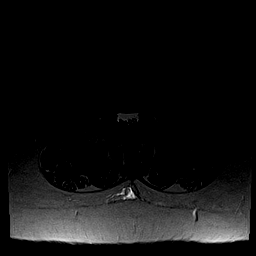
[im 23/44]
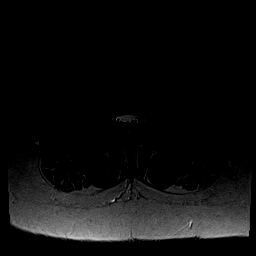
[im 26/44]
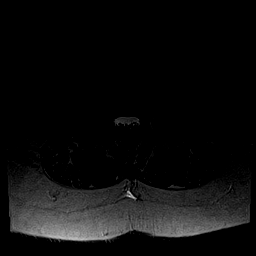
[im 32/44]
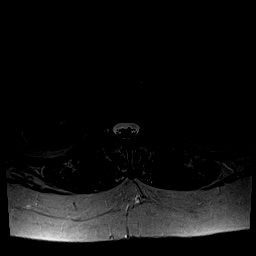
[im 38/44]
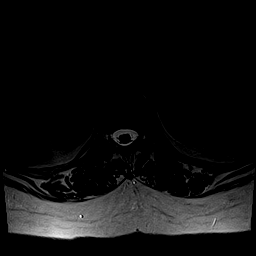
[im 44/44]
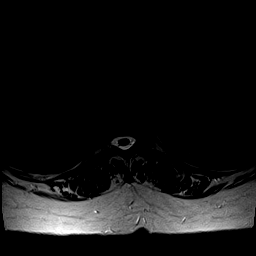

[Series 6: T1 · axial · 4.0mm · 0.31mm/px · z∈[-42,+159]mm · 4 of 44 slices shown (2 of 2)]
[im 3/44]
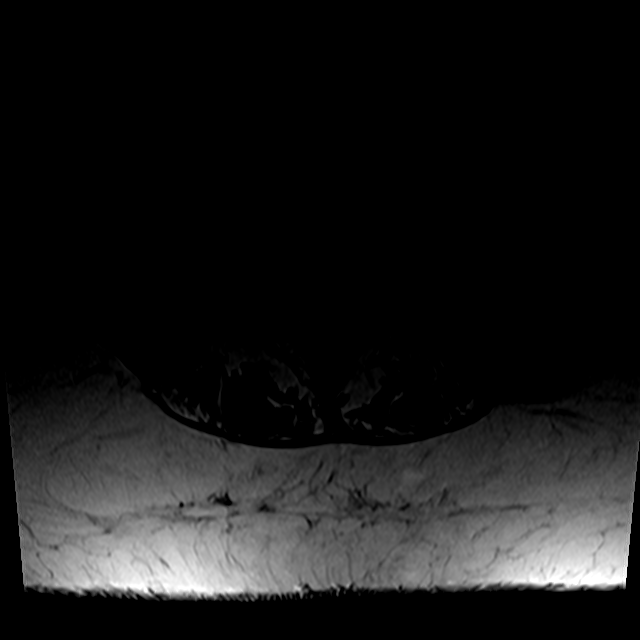
[im 6/44]
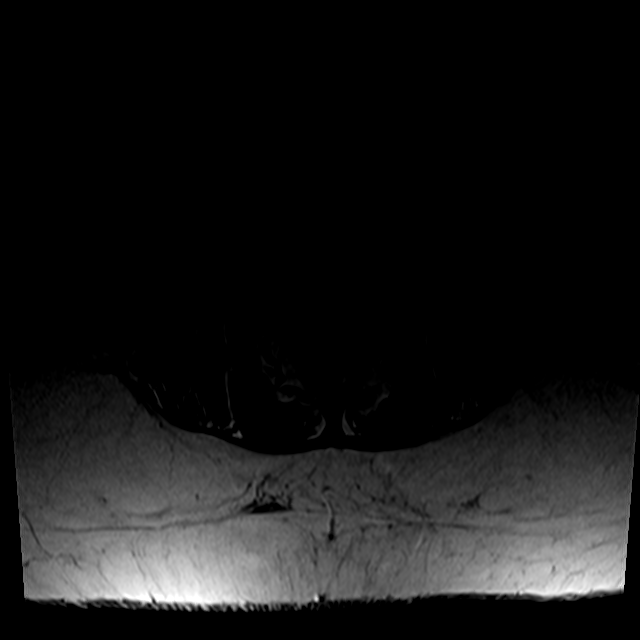
[im 23/44]
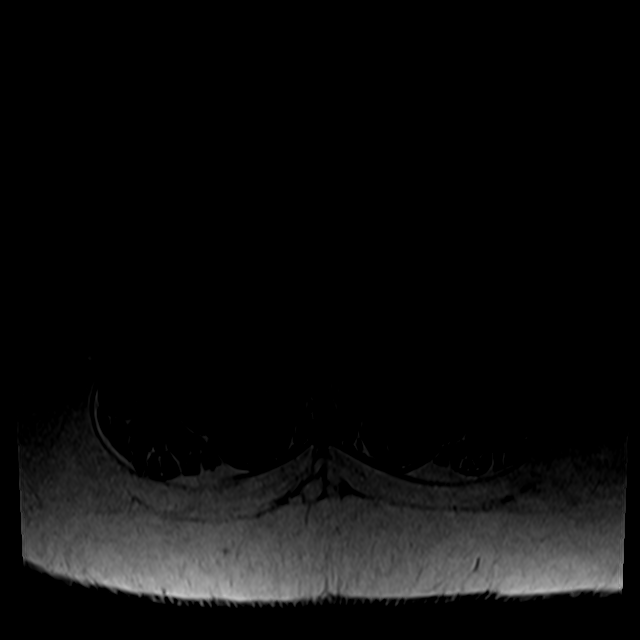
[im 38/44]
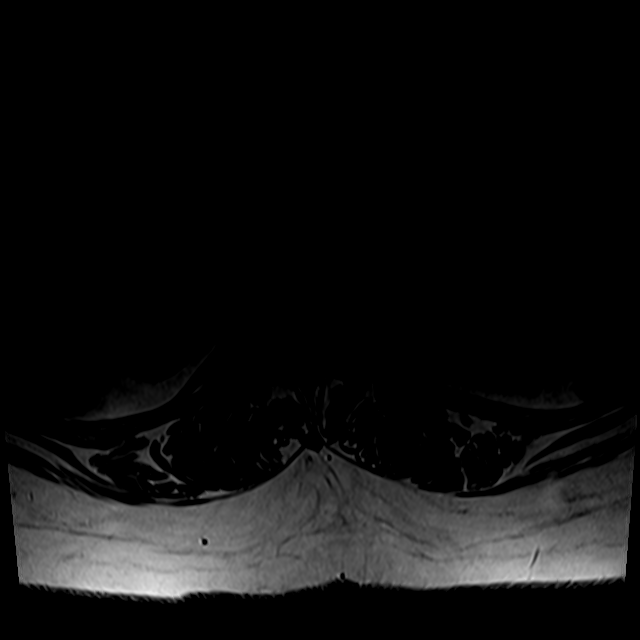

[25 of 48 positions shown; findings below may reference images not displayed]

FINDINGS: Segmentation:  Standard.

Alignment: 0.3 cm facet mediated anterolisthesis L4 on L5 is seen.
Alignment is otherwise normal. No pars defect is identified.

Vertebrae:  Height and signal are unremarkable.

Conus medullaris: Extends to the L1-2 level and appears normal.

Paraspinal and other soft tissues: Negative.

Disc levels:

T11-12: Shallow broad-based central protrusion and ligamentum flavum
thickening on the left. The central canal and foramina are open.
Small perineural cyst on the left is noted.

T12-L1: Negative. Perineural cysts are incidentally noted, larger on
the right.

L1-2: There is some ligamentum flavum thickening. The central canal
and foramina are open.

L2-3:  Mild facet degenerative change.  Otherwise negative.

L3-4: Minimal disc bulge without central canal or foraminal
narrowing.

L4-5: There is bilateral facet degenerative change with associated
effusions. Synovial cysts are seen off the posterior, inferior
margins of both facet joints, larger on the left, where a cyst
measuring 0.8 cm is identified. These cysts do not impact the
central canal and nerve roots. Ligamentum flavum thickening is
present. The disc is uncovered with a shallow bulge. Moderate to
moderately severe central canal stenosis is seen with narrowing in
the subarticular recesses. Mild to moderate foraminal narrowing
appears worse on the right.

L5-S1: Very shallow central protrusion without central canal or
foraminal stenosis.
IMPRESSION: Spondylosis worst at L4-5 where there is a moderate to moderately
severe central canal narrowing due to ligamentum flavum thickening
and a shallow disc bulge. Facet arthropathy at this level with
associated effusions results in 0.3 cm anterolisthesis.

## 2019-01-10 ENCOUNTER — Other Ambulatory Visit: Payer: Self-pay | Admitting: Psychiatry

## 2019-01-10 ENCOUNTER — Other Ambulatory Visit: Payer: Self-pay | Admitting: Family Medicine

## 2019-01-10 DIAGNOSIS — F331 Major depressive disorder, recurrent, moderate: Secondary | ICD-10-CM

## 2019-01-10 DIAGNOSIS — F5105 Insomnia due to other mental disorder: Secondary | ICD-10-CM

## 2019-01-10 MED ORDER — TRAZODONE HCL 100 MG PO TABS: 100.0000 mg | ORAL_TABLET | Freq: Every day | ORAL | 0 refills | Status: DC

## 2019-01-10 MED ORDER — DULOXETINE HCL 60 MG PO CPEP: 60.0000 mg | ORAL_CAPSULE | Freq: Every day | ORAL | 0 refills | Status: DC

## 2019-01-10 NOTE — Telephone Encounter (Unsigned)
Copied from [place] [id number]. Topic: Quick Communication - Rx Refill/Question >> Jan 10, 2019  3:23 PM Celene Kras A wrote: Medication: Trazodone, duloxetine  Has the patient contacted their pharmacy? Yes.   (Agent: If no, request that the patient contact the pharmacy for the refill.) (Agent: If yes, when and what did the pharmacy advise?)  Preferred Pharmacy (with phone number or street name): [facility] [place], [place] [address] Phone: [phone] Fax: [phone] Not a 24 hour pharmacy; exact hours not known.    Agent: Please be advised that RX refills may take up to 3 business days. We ask that you follow-up with your pharmacy.

## 2019-01-11 NOTE — Telephone Encounter (Signed)
Copied from CRM #[id number]. Topic: Quick Communication - Rx Refill/Question °>> Jan 10, 2019  3:23 PM Williams, Olivia A wrote: °Medication: Trazodone, duloxetine ° °Has the patient contacted their pharmacy? Yes.   °(Agent: If no, request that the patient contact the pharmacy for the refill.) °(Agent: If yes, when and what did the pharmacy advise?) ° °Preferred Pharmacy (with phone number or street name): [id number] - [place], [place] - [address] °[address] [place] [address] °Phone: [phone] Fax: [phone] °Not a 24 hour pharmacy; exact hours not known. ° ° ° °Agent: Please be advised that RX refills may take up to 3 business days. We ask that you follow-up with your pharmacy. °

## 2019-01-13 ENCOUNTER — Other Ambulatory Visit: Payer: Self-pay | Admitting: Family Medicine

## 2019-02-24 ENCOUNTER — Other Ambulatory Visit: Payer: Self-pay | Admitting: Family Medicine

## 2019-02-24 DIAGNOSIS — E785 Hyperlipidemia, unspecified: Secondary | ICD-10-CM

## 2019-03-09 ENCOUNTER — Encounter: Payer: Self-pay | Admitting: Family Medicine

## 2019-03-09 ENCOUNTER — Other Ambulatory Visit: Payer: Self-pay

## 2019-03-09 ENCOUNTER — Ambulatory Visit (INDEPENDENT_AMBULATORY_CARE_PROVIDER_SITE_OTHER): Payer: BLUE CROSS/BLUE SHIELD | Admitting: Family Medicine

## 2019-03-09 VITALS — BP 120/80 | HR 100 | Temp 97.3°F | Resp 14 | Ht 59.0 in | Wt 132.7 lb

## 2019-03-09 DIAGNOSIS — E1169 Type 2 diabetes mellitus with other specified complication: Secondary | ICD-10-CM

## 2019-03-09 DIAGNOSIS — E785 Hyperlipidemia, unspecified: Secondary | ICD-10-CM

## 2019-03-09 DIAGNOSIS — B001 Herpesviral vesicular dermatitis: Secondary | ICD-10-CM

## 2019-03-09 DIAGNOSIS — F325 Major depressive disorder, single episode, in full remission: Secondary | ICD-10-CM | POA: Diagnosis not present

## 2019-03-09 DIAGNOSIS — K219 Gastro-esophageal reflux disease without esophagitis: Secondary | ICD-10-CM | POA: Diagnosis not present

## 2019-03-09 DIAGNOSIS — R202 Paresthesia of skin: Secondary | ICD-10-CM

## 2019-03-09 DIAGNOSIS — D692 Other nonthrombocytopenic purpura: Secondary | ICD-10-CM

## 2019-03-09 DIAGNOSIS — J449 Chronic obstructive pulmonary disease, unspecified: Secondary | ICD-10-CM

## 2019-03-09 DIAGNOSIS — E038 Other specified hypothyroidism: Secondary | ICD-10-CM

## 2019-03-09 DIAGNOSIS — B351 Tinea unguium: Secondary | ICD-10-CM

## 2019-03-09 DIAGNOSIS — Z79899 Other long term (current) drug therapy: Secondary | ICD-10-CM

## 2019-03-09 MED ORDER — ANORO ELLIPTA 62.5-25 MCG/INH IN AEPB: 1.0000 | INHALATION_SPRAY | Freq: Every day | RESPIRATORY_TRACT | 5 refills | Status: DC

## 2019-03-09 MED ORDER — BUPROPION HCL ER (XL) 150 MG PO TB24: 150.0000 mg | ORAL_TABLET | Freq: Every day | ORAL | 1 refills | Status: DC

## 2019-03-09 MED ORDER — ACYCLOVIR 400 MG PO TABS: ORAL_TABLET | ORAL | 0 refills | Status: DC

## 2019-03-09 MED ORDER — DULOXETINE HCL 60 MG PO CPEP: 60.0000 mg | ORAL_CAPSULE | Freq: Every day | ORAL | 0 refills | Status: DC

## 2019-03-09 NOTE — Progress Notes (Signed)
Name: Sara Andrews   MRN: [id number]    DOB: 1955/08/05   Date:03/09/2019       Progress Note  Subjective  Chief Complaint  Chief Complaint  Patient presents with  . Follow-up    6 month recheck  . Shoulder Pain    right shoulder  . Rash    spots coming up over skin    HPI  Senile Purpura: she has noticed purple spots on arms and sometimes legs, when she bumps on anything, explained it is normal in her age, reassurance given   Neck pain: dull ache going from shoulder to right side of neck going on for the past few days, also has intermittent right hand paresthesia but has been going longer and worse on her thumb. She sees Dr. Holley Raring and has baclofen at home, advised to take baclofen to see if it improves. She has a history of surgery on left thumb and she states seems similar, discussed referring back to Ortho but she would like to hold off for now   DDD andspondylolisthesis:doing well since surgery done 02/2018. Doing well, off pain medication, taking prn tylenol only and since pain is under control, still under the care of Dr. Holley Raring.   COPD: she had one flare in 2020, and one flare in 2019 , she responded to prednisone, she was on trelegy but stopped on her own because did not tolerate medication, we will switch to Anoro and monitor, using rescue inhaler a couple of  times a week for SOB , she denies wheezing, but has intermittent coughing. She quit smoking in 2009.  GERD: shestopped Omeprazole because she only gets nauseated when she takes medication, no heartburn or indigestion since she stopped medication   Hypothyroidism: reviewed labs and TSHdone07/2019at goal, continue current dose,she has stable constipation, she has chronic dry skin, we will recheck TSH   DM II : diagnosed at work back in 2012she used to take medications, A1C at goal.She denies polyphagia, polyuria , she has polydipsia. Started on Trulicity June 0258, but she was having worsening of  constipation and indigestion, so we switched to Ozempic 10/2018weight was 198 lbs and is down to 132.4  lbs. We tried stopping Ozempic to see if nausea would resolve, but she went right back on it, she is afraid to stop medication and gain weight back. She has lost a lot of weight, cologuard is up to date, she is due for mammogram but wants to wait until Dec when she has medicare   Major Depression: She is feeling better, no longer seeing Dr. Shea Evans but taking medication as prescribed . Phq9 is normal, patient is in remission   Recurrent nausea and weight loss: discussed referral to GI and CT abdomen but she states she is doing much better and states weight loss from Ozempic, advised to have mammogram, also needs to let me know if she would like further testing    Patient Active Problem List   Diagnosis Date Noted  . Spondylosis of lumbar spine 11/06/2016  . Spinal stenosis of lumbar region with neurogenic claudication 10/28/2016  . Chronic bilateral low back pain with bilateral sciatica 09/16/2016  . Hyperglycemia 08/07/2016  . Moderate episode of recurrent major depressive disorder ([place]) 08/07/2016  . Pain of left heel 06/02/2016  . Degenerative cervical disc 03/04/2016  . Gastroesophageal reflux disease without esophagitis 02/01/2016  . Primary osteoarthritis of right hand 02/01/2016  . Paresthesias in left hand 02/01/2016  . SOB (shortness of breath) 10/05/2013  .  COPD (chronic obstructive pulmonary disease) (HCC) 10/05/2013  . History of smoking 10/05/2013  . Hyperlipidemia 10/05/2013  . Adult onset hypothyroidism 10/05/2013    Past Surgical History:  Procedure Laterality Date  . ABDOMINAL HYSTERECTOMY    . CARPAL TUNNEL RELEASE Right   . LUMBAR LAMINECTOMY  03/16/2018   L4-5 Laminectomy & Fusion w/ Pedicle Screws, TLIF & Allograft; Surgeon: Virl DiamondMax William Cohen, MD; Location: [facility] MAIN OR; Service: Orthopedics; Laterality: N/A; Prone, ProAxis, Stim Neuromonitoring, O-Arm, Cell  Saver, [place], [employer], Justin, PACS on [facility]    . OOPHORECTOMY    . ORIF TIBIA PLATEAU Left 03/10/2016   Procedure: OPEN REDUCTION INTERNAL FIXATION (ORIF) BICONDYLAR TIBIAL PLATEAU FRACTURE;  Surgeon: Eldred MangesMark C Yates, MD;  Location: [facility] OR;  Service: Orthopedics;  Laterality: Left;  . SHOULDER ARTHROSCOPY W/ ROTATOR CUFF REPAIR Right   . TONSILLECTOMY AND ADENOIDECTOMY      Family History  Problem Relation Age of Onset  . Hypertension Daughter   . Breast cancer Daughter   . Diabetes Father   . Heart disease Father   . Breast cancer Maternal Aunt 60  . Breast cancer Paternal Aunt [id number]  . Depression Sister   . Alcohol abuse Sister   . Anxiety disorder Sister   . Bipolar disorder Sister   . Pneumonia Sister   . Skin cancer Daughter   . Anxiety disorder Daughter     Social History   Socioeconomic History  . Marital status: Widowed    Spouse name: Not on file  . Number of children: 3  . Years of education: Not on file  . Highest education level: 10th grade  Occupational History  . Occupation: disabled    Comment: retired  Engineer, productionocial Needs  . Financial resource strain: Not hard at all  . Food insecurity    Worry: Never true    Inability: Never true  . Transportation needs    Medical: No    Non-medical: No  Tobacco Use  . Smoking status: Former Smoker    Packs/day: 2.00    Years: 30.00    Pack years: 60.00    Quit date: 09/03/2008    Years since quitting: 10.5  . Smokeless tobacco: Never Used  Substance and Sexual Activity  . Alcohol use: No  . Drug use: No  . Sexual activity: Never  Lifestyle  . Physical activity    Days per week: 0 days    Minutes per session: 0 min  . Stress: Not at all  Relationships  . Social Musicianconnections    Talks on phone: Twice a week    Gets together: Never    Attends religious service: More than 4 times per year    Active member of club or organization: No    Attends meetings of clubs or organizations: Never    Relationship status:  Widowed  . Intimate partner violence    Fear of current or ex partner: No    Emotionally abused: No    Physically abused: No    Forced sexual activity: No  Other Topics Concern  . Not on file  Social History Narrative   She used to work at  [employer], but had a left knee work related injury 03/10/2016 , require left knee surgery and is now having back pain, that is getting evaluated, Out of work since.       Patient recently found out that one of her twins [name](Christie) was diagnosed with breast cancer); will be having a double mastectomy soon.  Current Outpatient Medications:  .  albuterol (PROVENTIL) (2.5 MG/3ML) 0.083% nebulizer solution, Take 3 mLs (2.5 mg total) by nebulization every 6 (six) hours as needed for wheezing or shortness of breath., Disp: 75 mL, Rfl: 2 .  buPROPion (WELLBUTRIN XL) 150 MG 24 hr tablet, Take 1 tablet (150 mg total) by mouth daily., Disp: 90 tablet, Rfl: 1 .  Cholecalciferol (VITAMIN D PO), Take 1 capsule by mouth daily., Disp: , Rfl:  .  DULoxetine (CYMBALTA) 60 MG capsule, Take 1 capsule (60 mg total) by mouth daily., Disp: 90 capsule, Rfl: 0 .  levothyroxine (SYNTHROID, LEVOTHROID) 88 MCG tablet, Take 1 tablet (88 mcg total) by mouth daily before breakfast., Disp: 90 tablet, Rfl: 1 .  Magnesium Oxide 400 MG CAPS, Take 1 capsule (400 mg total) by mouth once., Disp: 30 capsule, Rfl: 5 .  omeprazole (PRILOSEC) 40 MG capsule, TAKE 1 CAPSULE(40 MG) BY MOUTH DAILY, Disp: 90 capsule, Rfl: 0 .  OZEMPIC, 0.25 OR 0.5 MG/DOSE, 2 MG/1.5ML SOPN, INJECT 0.5 MG UNDER THE SKIN ONCE A WEEK, Disp: 9 mL, Rfl: 0 .  PROAIR HFA 108 (90 Base) MCG/ACT inhaler, INHALE 2 PUFFS INTO THE LUNGS EVERY 6 HOURS AS NEEDED FOR WHEEZING OR SHORTNESS OF BREATH, Disp: 8.5 g, Rfl: 0 .  rosuvastatin (CRESTOR) 20 MG tablet, TAKE 1 TABLET BY MOUTH DAILY, Disp: 90 tablet, Rfl: 1 .  traZODone (DESYREL) 100 MG tablet, Take 1 tablet (100 mg total) by mouth at bedtime., Disp: 90 tablet, Rfl:  0 .  acyclovir (ZOVIRAX) 400 MG tablet, Take one tablet by mouth daily, Disp: 30 tablet, Rfl: 0 .  baclofen (LIORESAL) 10 MG tablet, Take 1 tablet by mouth daily as needed., Disp: , Rfl:  .  fluocinonide (LIDEX) 0.05 % external solution, APP EXT AA BID, Disp: , Rfl: 0 .  SUMAtriptan (IMITREX) 25 MG tablet, May repeat in 2 hours if headache persists or recurs. Do not take more than 3 in 24 hours. (Patient not taking: Reported on 11/01/2018), Disp: 10 tablet, Rfl: 0 .  umeclidinium-vilanterol (ANORO ELLIPTA) 62.5-25 MCG/INH AEPB, Inhale 1 puff into the lungs daily., Disp: 60 each, Rfl: 5  No Known Allergies  I personally reviewed active problem list, medication list, allergies, family history, social history with the patient/caregiver today.   ROS  Constitutional: Negative for fever , positive for  weight change.  Respiratory: Positive  for cough and shortness of breath.   Cardiovascular: Negative for chest pain or palpitations.  Gastrointestinal: Negative for abdominal pain, no bowel changes.  Musculoskeletal: Negative for gait problem or joint swelling.  Skin: Negative for rash.  Neurological: Negative for dizziness or headache.  No other specific complaints in a complete review of systems (except as listed in HPI above).  Objective  Vitals:   03/09/19 0934  BP: 120/80  Pulse: 100  Resp: 14  Temp: (!) 97.3 F (36.3 C)  TempSrc: Oral  SpO2: 93%  Weight: 132 lb 11.2 oz (60.2 kg)  Height: 4\' 11"  (1.499 m)    Body mass index is 26.8 kg/m.  Physical Exam  Constitutional: Patient appears well-developed and well-nourished. Overweight.  No distress.  HEENT: head atraumatic, normocephalic, pupils equal and reactive to light, neck supple Cardiovascular: Normal rate, regular rhythm and normal heart sounds.  No murmur heard. No BLE edema. Pulmonary/Chest: Effort normal and breath sounds normal. No respiratory distress. Abdominal: Soft.  There is no tenderness. Psychiatric: Patient  has a normal mood and affect. behavior is normal. Judgment and thought content  normal.   Diabetic Foot Exam: Diabetic Foot Exam - Simple   Simple Foot Form Diabetic Foot exam was performed with the following findings: Yes 03/09/2019 10:28 AM  Visual Inspection See comments: Yes Sensation Testing Intact to touch and monofilament testing bilaterally: Yes Pulse Check Posterior Tibialis and Dorsalis pulse intact bilaterally: Yes Comments onychomycosis on both first toes      PHQ2/9: Depression screen [facility] 03/09/2019 11/01/2018 09/08/2018 06/17/2018 05/26/2018  Decreased Interest 0 1 0 0 0  Down, Depressed, Hopeless 0 0 0 0 0  PHQ - 2 Score 0 1 0 0 0  Altered sleeping 0 0 1 1 -  Tired, decreased energy 0 0 1 0 -  Change in appetite 0 0 0 1 -  Feeling bad or failure about yourself  0 1 0 0 -  Trouble concentrating 0 0 0 0 -  Moving slowly or fidgety/restless 0 0 0 0 -  Suicidal thoughts 0 0 0 0 -  PHQ-9 Score 0 2 2 2  -  Difficult doing work/chores Not difficult at all Not difficult at all Not difficult at all Not difficult at all -  Some recent data might be hidden    phq 9 is negative  Fall Risk: Fall Risk  03/09/2019 11/01/2018 09/08/2018 06/17/2018 05/26/2018  Falls in the past year? 0 0 1 0 No  Number falls in past yr: 0 0 0 - -  Injury with Fall? 0 0 1 - -  Comment - - Hurt her right side-around Christmas fell on her deck - -  Follow up Falls evaluation completed - - - -      Assessment & Plan   1. Major depression in remission (HCC)  - buPROPion (WELLBUTRIN XL) 150 MG 24 hr tablet; Take 1 tablet (150 mg total) by mouth daily.  Dispense: 90 tablet; Refill: 1 - DULoxetine (CYMBALTA) 60 MG capsule; Take 1 capsule (60 mg total) by mouth daily.  Dispense: 90 capsule; Refill: 0  2. Gastroesophageal reflux disease without esophagitis  She stopped omeprazole and we will try rollaids or tums prn   3. Dyslipidemia  - Lipid panel  4. Chronic obstructive pulmonary  disease, unspecified COPD type (HCC)  - umeclidinium-vilanterol (ANORO ELLIPTA) 62.5-25 MCG/INH AEPB; Inhale 1 puff into the lungs daily.  Dispense: 60 each; Refill: 5  5. Dyslipidemia associated with type 2 diabetes mellitus (HCC)  - CBC with Differential/Platelet - Lipid panel - Hemoglobin A1c - Microalbumin / creatinine urine ratio  6. Right hand paresthesia  - B12  7. Fever blister  - acyclovir (ZOVIRAX) 400 MG tablet; Take one tablet by mouth daily  Dispense: 30 tablet; Refill: 0  8. Other specified hypothyroidism  - TSH  9. Long-term use of high-risk medication  - Comprehensive metabolic panel  10. Onychomycosis  She will have labs first and if normal liver enzymes we will call in Lamisil   11. Senile purpura (HCC)  Check labs

## 2019-03-09 NOTE — Patient Instructions (Signed)
Baclofen  

## 2019-03-12 LAB — COMPREHENSIVE METABOLIC PANEL
ALT: 27 IU/L (ref 0–32)
AST: 22 IU/L (ref 0–40)
Albumin/Globulin Ratio: 2.2 (ref 1.2–2.2)
Albumin: 3.8 g/dL (ref 3.8–4.8)
Alkaline Phosphatase: 76 IU/L (ref 39–117)
BUN/Creatinine Ratio: 11 — ABNORMAL LOW (ref 12–28)
BUN: 10 mg/dL (ref 8–27)
Bilirubin Total: 0.5 mg/dL (ref 0.0–1.2)
CO2: 25 mmol/L (ref 20–29)
Calcium: 9.4 mg/dL (ref 8.7–10.3)
Chloride: 106 mmol/L (ref 96–106)
Creatinine, Ser: 0.88 mg/dL (ref 0.57–1.00)
GFR calc Af Amer: 80 mL/min/{1.73_m2} (ref >59)
GFR calc non Af Amer: 70 mL/min/{1.73_m2} (ref >59)
Globulin, Total: 1.7 g/dL (ref 1.5–4.5)
Glucose: 110 mg/dL — ABNORMAL HIGH (ref 65–99)
Potassium: 4.7 mmol/L (ref 3.5–5.2)
Sodium: 142 mmol/L (ref 134–144)
Total Protein: 5.5 g/dL — ABNORMAL LOW (ref 6.0–8.5)

## 2019-03-12 LAB — LIPID PANEL
Chol/HDL Ratio: 1.8 ratio (ref 0.0–4.4)
Cholesterol, Total: 105 mg/dL (ref 100–199)
HDL: 60 mg/dL (ref >39)
LDL Calculated: 35 mg/dL (ref 0–99)
Triglycerides: 49 mg/dL (ref 0–149)
VLDL Cholesterol Cal: 10 mg/dL (ref 5–40)

## 2019-03-12 LAB — CBC WITH DIFFERENTIAL/PLATELET
Basophils Absolute: 0 10*3/uL (ref 0.0–0.2)
Basos: 1 %
EOS (ABSOLUTE): 0.1 10*3/uL (ref 0.0–0.4)
Eos: 3 %
Hematocrit: 39.8 % (ref 34.0–46.6)
Hemoglobin: 12.9 g/dL (ref 11.1–15.9)
Immature Grans (Abs): 0 10*3/uL (ref 0.0–0.1)
Immature Granulocytes: 0 %
Lymphocytes Absolute: 1.5 10*3/uL (ref 0.7–3.1)
Lymphs: 38 %
MCH: 29 pg (ref 26.6–33.0)
MCHC: 32.4 g/dL (ref 31.5–35.7)
MCV: 89 fL (ref 79–97)
Monocytes Absolute: 0.3 10*3/uL (ref 0.1–0.9)
Monocytes: 7 %
Neutrophils Absolute: 2 10*3/uL (ref 1.4–7.0)
Neutrophils: 51 %
Platelets: 225 10*3/uL (ref 150–450)
RBC: 4.45 x10E6/uL (ref 3.77–5.28)
RDW: 12 % (ref 11.7–15.4)
WBC: 4 10*3/uL (ref 3.4–10.8)

## 2019-03-12 LAB — TSH: TSH: 0.067 u[IU]/mL — ABNORMAL LOW (ref 0.450–4.500)

## 2019-03-12 LAB — HEMOGLOBIN A1C
Est. average glucose Bld gHb Est-mCnc: 105 mg/dL
Hgb A1c MFr Bld: 5.3 % (ref 4.8–5.6)

## 2019-03-12 LAB — MICROALBUMIN / CREATININE URINE RATIO
Creatinine, Urine: 155.9 mg/dL
Microalb/Creat Ratio: 6 mg/g creat (ref 0–29)
Microalbumin, Urine: 9.9 ug/mL

## 2019-03-12 LAB — VITAMIN B12: Vitamin B-12: 417 pg/mL (ref 232–1245)

## 2019-03-14 ENCOUNTER — Other Ambulatory Visit: Payer: Self-pay | Admitting: Family Medicine

## 2019-03-14 DIAGNOSIS — E038 Other specified hypothyroidism: Secondary | ICD-10-CM

## 2019-03-14 DIAGNOSIS — Z79899 Other long term (current) drug therapy: Secondary | ICD-10-CM

## 2019-03-14 MED ORDER — TERBINAFINE HCL 250 MG PO TABS: 250.0000 mg | ORAL_TABLET | Freq: Every day | ORAL | 2 refills | Status: DC

## 2019-03-14 MED ORDER — LEVOTHYROXINE SODIUM 88 MCG PO TABS: 88.0000 ug | ORAL_TABLET | Freq: Every day | ORAL | 0 refills | Status: DC

## 2019-03-17 ENCOUNTER — Other Ambulatory Visit: Payer: Self-pay

## 2019-03-17 DIAGNOSIS — E038 Other specified hypothyroidism: Secondary | ICD-10-CM

## 2019-03-17 DIAGNOSIS — Z79899 Other long term (current) drug therapy: Secondary | ICD-10-CM

## 2019-03-25 ENCOUNTER — Other Ambulatory Visit: Payer: Self-pay | Admitting: *Deleted

## 2019-03-25 DIAGNOSIS — Z20822 Contact with and (suspected) exposure to covid-19: Secondary | ICD-10-CM

## 2019-03-26 ENCOUNTER — Other Ambulatory Visit: Payer: Self-pay | Admitting: Psychiatry

## 2019-03-26 ENCOUNTER — Other Ambulatory Visit: Payer: Self-pay | Admitting: Family Medicine

## 2019-03-26 DIAGNOSIS — F5105 Insomnia due to other mental disorder: Secondary | ICD-10-CM

## 2019-03-26 DIAGNOSIS — K219 Gastro-esophageal reflux disease without esophagitis: Secondary | ICD-10-CM

## 2019-03-26 LAB — NOVEL CORONAVIRUS, NAA: SARS-CoV-2, NAA: NOT DETECTED

## 2019-03-28 ENCOUNTER — Telehealth: Payer: Self-pay | Admitting: Family Medicine

## 2019-03-28 NOTE — Telephone Encounter (Signed)
Medication Refill - Medication: DULoxetine (CYMBALTA) 60 MG capsule  Has the patient contacted their pharmacy? Yes - states they can not refill.  Pt then realized that she has been taking 2 caps a day instead of the RXd 1 cap a day.  Pt needs to know what to do and see if she can get a refill now.  (Agent: If no, request that the patient contact the pharmacy for the refill.) (Agent: If yes, when and what did the pharmacy advise?)  Preferred Pharmacy (with phone number or street name):  [id number] [address], [place] - [address] [place] [phone] (Phone) [phone] (Fax)   Agent: Please be advised that RX refills may take up to 3 business days. We ask that you follow-up with your pharmacy.

## 2019-03-29 NOTE — Telephone Encounter (Signed)
Left voicemail if patient was taking the Duloxetine wrong with 2 a day that 90 supply given on 03/09/2019 is still a 45 day supply and should not be out. Informed patient to take it only once daily as instructed by Dr. Ancil Boozer and to calls [place] when she gets low on the medication.

## 2019-03-30 ENCOUNTER — Other Ambulatory Visit: Payer: Self-pay | Admitting: Family Medicine

## 2019-03-30 DIAGNOSIS — F325 Major depressive disorder, single episode, in full remission: Secondary | ICD-10-CM

## 2019-03-30 MED ORDER — DULOXETINE HCL 30 MG PO CPEP: 30.0000 mg | ORAL_CAPSULE | Freq: Every day | ORAL | 0 refills | Status: DC

## 2019-03-30 NOTE — Telephone Encounter (Signed)
Patient verbalized understanding to take the 30 mg of Duloxetine for the first week then titrate up to two daily for a total of 60 mg.

## 2019-03-30 NOTE — Telephone Encounter (Signed)
Patient states she picked up the Duloxetine 60 mg on 02/05/2019 however, instead of taking it daily she was taking it twice daily. Since taking it wrong her 90 day prescription only lasted her 45 days and she has been without for the past week. She feels "bad" not having the medication and would like a refill or changed to something else.

## 2019-03-30 NOTE — Telephone Encounter (Signed)
Patient is calling back to check status of this request. She would like it sent to pharmacy as soon as possible.

## 2019-04-10 ENCOUNTER — Other Ambulatory Visit: Payer: Self-pay | Admitting: Family Medicine

## 2019-04-10 DIAGNOSIS — J3089 Other allergic rhinitis: Secondary | ICD-10-CM

## 2019-04-10 DIAGNOSIS — F5105 Insomnia due to other mental disorder: Secondary | ICD-10-CM

## 2019-04-13 ENCOUNTER — Encounter: Payer: Self-pay | Admitting: Family Medicine

## 2019-04-13 ENCOUNTER — Other Ambulatory Visit: Payer: Self-pay

## 2019-04-13 ENCOUNTER — Ambulatory Visit (INDEPENDENT_AMBULATORY_CARE_PROVIDER_SITE_OTHER): Payer: BLUE CROSS/BLUE SHIELD | Admitting: Family Medicine

## 2019-04-13 DIAGNOSIS — L03811 Cellulitis of head [any part, except face]: Secondary | ICD-10-CM | POA: Diagnosis not present

## 2019-04-13 DIAGNOSIS — B028 Zoster with other complications: Secondary | ICD-10-CM | POA: Diagnosis not present

## 2019-04-13 MED ORDER — DOXYCYCLINE HYCLATE 100 MG PO TABS: 100.0000 mg | ORAL_TABLET | Freq: Two times a day (BID) | ORAL | 0 refills | Status: AC

## 2019-04-13 MED ORDER — VALACYCLOVIR HCL 1 G PO TABS: 1000.0000 mg | ORAL_TABLET | Freq: Three times a day (TID) | ORAL | 0 refills | Status: AC

## 2019-04-13 NOTE — Progress Notes (Signed)
Name: Sara Andrews   MRN: [id number]    DOB: 03/24/1956   Date:04/13/2019       Progress Note  Subjective  Chief Complaint  Chief Complaint  Patient presents with  . Rash    red bump in head, painful,for 5 days    I connected with  [name]  on 04/13/19 at  8:40 AM EDT by a video enabled telemedicine application and verified that I am speaking with the correct person using two identifiers.  I discussed the limitations of evaluation and management by telemedicine and the availability of in person appointments. The patient expressed understanding and agreed to proceed. Staff also discussed with the patient that there may be a patient responsible charge related to this service. Patient Location: Home Provider Location: Home Additional Individuals present: None  HPI  She is having pain in the shape of a "question mark" just above the LEFT ear and down to the LEFT side of her head.  She has 2-3 small lesions that have developed - they were never blisters - initially were solid and then started to scab over.  She notes the pain started 5 days ago and progressively worsened.  She has been applying ice with no relief  She denies any hearing issues or ear/posterior ear swelling. She does take acyclovir for fever blisters PRN only.   Patient Active Problem List   Diagnosis Date Noted  . Spondylosis of lumbar spine 11/06/2016  . Spinal stenosis of lumbar region with neurogenic claudication 10/28/2016  . Chronic bilateral low back pain with bilateral sciatica 09/16/2016  . Hyperglycemia 08/07/2016  . Moderate episode of recurrent major depressive disorder (HCC) 08/07/2016  . Pain of left heel 06/02/2016  . Degenerative cervical disc 03/04/2016  . Gastroesophageal reflux disease without esophagitis 02/01/2016  . Primary osteoarthritis of right hand 02/01/2016  . Paresthesias in left hand 02/01/2016  . SOB (shortness of breath) 10/05/2013  . COPD (chronic obstructive  pulmonary disease) (HCC) 10/05/2013  . History of smoking 10/05/2013  . Hyperlipidemia 10/05/2013  . Adult onset hypothyroidism 10/05/2013    Social History   Tobacco Use  . Smoking status: Former Smoker    Packs/day: 2.00    Years: 30.00    Pack years: 60.00    Quit date: 09/03/2008    Years since quitting: 10.6  . Smokeless tobacco: Never Used  Substance Use Topics  . Alcohol use: No     Current Outpatient Medications:  .  acyclovir (ZOVIRAX) 400 MG tablet, Take one tablet by mouth daily, Disp: 30 tablet, Rfl: 0 .  albuterol (PROVENTIL) (2.5 MG/3ML) 0.083% nebulizer solution, Take 3 mLs (2.5 mg total) by nebulization every 6 (six) hours as needed for wheezing or shortness of breath., Disp: 75 mL, Rfl: 2 .  baclofen (LIORESAL) 10 MG tablet, Take 1 tablet by mouth daily as needed., Disp: , Rfl:  .  buPROPion (WELLBUTRIN XL) 150 MG 24 hr tablet, Take 1 tablet (150 mg total) by mouth daily., Disp: 90 tablet, Rfl: 1 .  Cholecalciferol (VITAMIN D PO), Take 1 capsule by mouth daily., Disp: , Rfl:  .  DULoxetine (CYMBALTA) 30 MG capsule, Take 1-2 capsules (30-60 mg total) by mouth daily., Disp: 60 capsule, Rfl: 0 .  fluocinonide (LIDEX) 0.05 % external solution, APP EXT AA BID, Disp: , Rfl: 0 .  levothyroxine (SYNTHROID) 88 MCG tablet, Take 1 tablet (88 mcg total) by mouth daily before breakfast. And only half on Sundays recheck in 6 weeks, Disp:  90 tablet, Rfl: 0 .  Magnesium Oxide 400 MG CAPS, Take 1 capsule (400 mg total) by mouth once., Disp: 30 capsule, Rfl: 5 .  omeprazole (PRILOSEC) 40 MG capsule, TAKE 1 CAPSULE(40 MG) BY MOUTH DAILY, Disp: 90 capsule, Rfl: 0 .  OZEMPIC, 0.25 OR 0.5 MG/DOSE, 2 MG/1.5ML SOPN, INJECT 0.5 MG UNDER THE SKIN ONCE A WEEK, Disp: 9 mL, Rfl: 0 .  PROAIR HFA 108 (90 Base) MCG/ACT inhaler, INHALE 2 PUFFS INTO THE LUNGS EVERY 6 HOURS AS NEEDED FOR WHEEZING OR SHORTNESS OF BREATH, Disp: 8.5 g, Rfl: 0 .  rosuvastatin (CRESTOR) 20 MG tablet, TAKE 1 TABLET BY MOUTH  DAILY, Disp: 90 tablet, Rfl: 1 .  terbinafine (LAMISIL) 250 MG tablet, Take 1 tablet (250 mg total) by mouth daily., Disp: 30 tablet, Rfl: 2 .  traZODone (DESYREL) 100 MG tablet, TAKE 1 TABLET BY MOUTH AT BEDTIME, Disp: 90 tablet, Rfl: 0 .  umeclidinium-vilanterol (ANORO ELLIPTA) 62.5-25 MCG/INH AEPB, Inhale 1 puff into the lungs daily., Disp: 60 each, Rfl: 5 .  SUMAtriptan (IMITREX) 25 MG tablet, May repeat in 2 hours if headache persists or recurs. Do not take more than 3 in 24 hours. (Patient not taking: Reported on 11/01/2018), Disp: 10 tablet, Rfl: 0  No Known Allergies  I personally reviewed active problem list, medication list, allergies, notes from last encounter, lab results with the patient/caregiver today.  ROS  Ten systems reviewed and is negative except as mentioned in HPI  Objective  Virtual encounter, vitals not obtained.  There is no height or weight on file to calculate BMI.  Nursing Note and Vital Signs reviewed.  Physical Exam  Constitutional: Patient appears well-developed and well-nourished. No distress, pleasant and overall well-appearing. HENT: Head: Normocephalic and atraumatic.  Neck: Normal range of motion. Pulmonary/Chest: Effort normal. No respiratory distress. Speaking in complete sentences Neurological: Pt is alert and oriented to person, place, and time. Coordination, speech and gait are normal.  Psychiatric: Patient has a normal mood and affect. behavior is normal. Judgment and thought content normal. Skin: The LEFT post-aurciuclar region is quite erythematous but is without any edema.  The scalp just above the ear is mildly erythematous, tender to palpation, and has 3 lesions - 2 with sanguinous crust and 1 that is flesh colored.    No results found for this or any previous visit (from the past 72 hour(s)).  Assessment & Plan  1. Herpes zoster with complication - valACYclovir (VALTREX) 1000 MG tablet; Take 1 tablet (1,000 mg total) by mouth 3  (three) times daily for 7 days.  Dispense: 21 tablet; Refill: 0  2. Cellulitis of head except face - doxycycline (VIBRA-TABS) 100 MG tablet; Take 1 tablet (100 mg total) by mouth 2 (two) times daily for 7 days.  Dispense: 14 tablet; Refill: 0  Discussed in detail with the patient - unclear if cellulitis vs shingles; I do not suspect mastoiditis, however she is aware that if any swelling were to occur to the area that she needs to go to the ER immediately for IV antibiotic and evaluation.  She is also aware of risk of shingles possibly involving the facial nerves, and will monitor closely for hearing or vision changes.  We will schedule close in person follow up tomorrow for further evaluation and monitoring.    -Red flags and when to present for emergency care or RTC including fever >101.58F, chest pain, shortness of breath, new/worsening/un-resolving symptoms, reviewed with patient at time of visit. Follow up and care  instructions discussed and provided in AVS. - I discussed the assessment and treatment plan with the patient. The patient was provided an opportunity to ask questions and all were answered. The patient agreed with the plan and demonstrated an understanding of the instructions.  I provided 16 minutes of non-face-to-face time during this encounter.  Doren CustardEmily E , FNP

## 2019-04-14 ENCOUNTER — Ambulatory Visit (INDEPENDENT_AMBULATORY_CARE_PROVIDER_SITE_OTHER): Payer: BLUE CROSS/BLUE SHIELD | Admitting: Family Medicine

## 2019-04-14 ENCOUNTER — Other Ambulatory Visit: Payer: Self-pay

## 2019-04-14 ENCOUNTER — Encounter: Payer: Self-pay | Admitting: General Surgery

## 2019-04-14 ENCOUNTER — Ambulatory Visit (INDEPENDENT_AMBULATORY_CARE_PROVIDER_SITE_OTHER): Payer: BLUE CROSS/BLUE SHIELD | Admitting: General Surgery

## 2019-04-14 ENCOUNTER — Ambulatory Visit: Payer: BLUE CROSS/BLUE SHIELD

## 2019-04-14 ENCOUNTER — Encounter: Payer: Self-pay | Admitting: Family Medicine

## 2019-04-14 VITALS — BP 115/75 | HR 79 | Temp 97.7°F | Ht 59.0 in | Wt 134.4 lb

## 2019-04-14 VITALS — BP 118/76 | HR 80 | Temp 97.5°F | Resp 14 | Ht 59.0 in | Wt 129.1 lb

## 2019-04-14 DIAGNOSIS — L03811 Cellulitis of head [any part, except face]: Secondary | ICD-10-CM

## 2019-04-14 DIAGNOSIS — L02811 Cutaneous abscess of head [any part, except face]: Secondary | ICD-10-CM | POA: Diagnosis not present

## 2019-04-14 NOTE — Progress Notes (Signed)
Name: Sara Andrews   MRN: [id number]    DOB: 1956/06/12   Date:04/14/2019       Progress Note  Subjective  Chief Complaint  Chief Complaint  Patient presents with  . Follow-up    1 day recheck knot on head, painful    HPI  Pt presents for 1 day follow up on possible scalp cellulitis vs shingles in office.   She started valtrex for shingles coverage and doxycycline for cellulitis coverage yesterday.  Today she notes the pain has not improved, no additional scalp lesions, she denies swelling or drainage from the LEFT postauricular site.  No fevers/chills, ear swelling/pain/hearing changes, no visual changes, no neck stiffness.  Below is the history from yesterday's visit on 04/13/2019 She is having pain in the shape of a "question mark" just above the LEFT ear and down to the LEFT side of her head.  She has 2-3 small lesions that have developed - they were never blisters - initially were solid and then started to scab over.  She notes the pain started 5 days ago and progressively worsened.  She has been applying ice with no relief  She denies any hearing issues or ear/posterior ear swelling. She does take acyclovir for fever blisters PRN only.   Patient Active Problem List   Diagnosis Date Noted  . Spondylosis of lumbar spine 11/06/2016  . Spinal stenosis of lumbar region with neurogenic claudication 10/28/2016  . Chronic bilateral low back pain with bilateral sciatica 09/16/2016  . Hyperglycemia 08/07/2016  . Moderate episode of recurrent major depressive disorder ([place]) 08/07/2016  . Pain of left heel 06/02/2016  . Degenerative cervical disc 03/04/2016  . Gastroesophageal reflux disease without esophagitis 02/01/2016  . Primary osteoarthritis of right hand 02/01/2016  . Paresthesias in left hand 02/01/2016  . SOB (shortness of breath) 10/05/2013  . COPD (chronic obstructive pulmonary disease) ([place]) 10/05/2013  . History of smoking 10/05/2013  . Hyperlipidemia 10/05/2013   . Adult onset hypothyroidism 10/05/2013    Social History   Tobacco Use  . Smoking status: Former Smoker    Packs/day: 2.00    Years: 30.00    Pack years: 60.00    Quit date: 09/03/2008    Years since quitting: 10.6  . Smokeless tobacco: Never Used  Substance Use Topics  . Alcohol use: No     Current Outpatient Medications:  .  acyclovir (ZOVIRAX) 400 MG tablet, Take one tablet by mouth daily, Disp: 30 tablet, Rfl: 0 .  albuterol (PROVENTIL) (2.5 MG/3ML) 0.083% nebulizer solution, Take 3 mLs (2.5 mg total) by nebulization every 6 (six) hours as needed for wheezing or shortness of breath., Disp: 75 mL, Rfl: 2 .  baclofen (LIORESAL) 10 MG tablet, Take 1 tablet by mouth daily as needed., Disp: , Rfl:  .  buPROPion (WELLBUTRIN XL) 150 MG 24 hr tablet, Take 1 tablet (150 mg total) by mouth daily., Disp: 90 tablet, Rfl: 1 .  Cholecalciferol (VITAMIN D PO), Take 1 capsule by mouth daily., Disp: , Rfl:  .  doxycycline (VIBRA-TABS) 100 MG tablet, Take 1 tablet (100 mg total) by mouth 2 (two) times daily for 7 days., Disp: 14 tablet, Rfl: 0 .  DULoxetine (CYMBALTA) 30 MG capsule, Take 1-2 capsules (30-60 mg total) by mouth daily., Disp: 60 capsule, Rfl: 0 .  fluocinonide (LIDEX) 0.05 % external solution, APP EXT AA BID, Disp: , Rfl: 0 .  levothyroxine (SYNTHROID) 88 MCG tablet, Take 1 tablet (88 mcg total) by mouth daily  before breakfast. And only half on Sundays recheck in 6 weeks, Disp: 90 tablet, Rfl: 0 .  Magnesium Oxide 400 MG CAPS, Take 1 capsule (400 mg total) by mouth once., Disp: 30 capsule, Rfl: 5 .  omeprazole (PRILOSEC) 40 MG capsule, TAKE 1 CAPSULE(40 MG) BY MOUTH DAILY, Disp: 90 capsule, Rfl: 0 .  OZEMPIC, 0.25 OR 0.5 MG/DOSE, 2 MG/1.5ML SOPN, INJECT 0.5 MG UNDER THE SKIN ONCE A WEEK, Disp: 9 mL, Rfl: 0 .  PROAIR HFA 108 (90 Base) MCG/ACT inhaler, INHALE 2 PUFFS INTO THE LUNGS EVERY 6 HOURS AS NEEDED FOR WHEEZING OR SHORTNESS OF BREATH, Disp: 8.5 g, Rfl: 0 .  rosuvastatin (CRESTOR)  20 MG tablet, TAKE 1 TABLET BY MOUTH DAILY, Disp: 90 tablet, Rfl: 1 .  terbinafine (LAMISIL) 250 MG tablet, Take 1 tablet (250 mg total) by mouth daily., Disp: 30 tablet, Rfl: 2 .  traZODone (DESYREL) 100 MG tablet, TAKE 1 TABLET BY MOUTH AT BEDTIME, Disp: 90 tablet, Rfl: 0 .  umeclidinium-vilanterol (ANORO ELLIPTA) 62.5-25 MCG/INH AEPB, Inhale 1 puff into the lungs daily., Disp: 60 each, Rfl: 5 .  valACYclovir (VALTREX) 1000 MG tablet, Take 1 tablet (1,000 mg total) by mouth 3 (three) times daily for 7 days., Disp: 21 tablet, Rfl: 0 .  SUMAtriptan (IMITREX) 25 MG tablet, May repeat in 2 hours if headache persists or recurs. Do not take more than 3 in 24 hours. (Patient not taking: Reported on 11/01/2018), Disp: 10 tablet, Rfl: 0  No Known Allergies  I personally reviewed active problem list, medication list, allergies, notes from last encounter, lab results with the patient/caregiver today.  ROS  Ten systems reviewed and is negative except as mentioned in HPI  Objective  Vitals:   04/14/19 1150  BP: 118/76  Pulse: 80  Resp: 14  Temp: (!) 97.5 F (36.4 C)  TempSrc: Temporal  SpO2: 96%  Weight: 129 lb 1.6 oz (58.6 kg)  Height: 4\' 11"  (1.499 m)    Body mass index is 26.08 kg/m.  Nursing Note and Vital Signs reviewed.  Physical Exam  Constitutional: Patient appears well-developed and well-nourished. No distress.  HENT: Head: Normocephalic and atraumatic.  Eyes: Conjunctivae and EOM are normal. No scleral icterus.  Neck: Normal range of motion. Neck supple. No JVD present.  Cardiovascular: Normal rate, regular rhythm and normal. No BLE edema. heart sounds.  No murmur heard Pulmonary/Chest: Effort normal and breath sounds normal. No respiratory distress. Musculoskeletal: Normal range of motion, no joint effusions. No gross deformities Neurological: Pt is alert and oriented to person, place, and time. No cranial nerve deficit. Coordination, balance, strength, speech and gait are  normal.  Skin: Skin is warm and dry. The LEFT post-aurciuclar region is erythematous.  The scalp just above the ear is mildly erythematous, tender to palpation, and has 3 lesions - 2 with sanguinous crust and 1 that is flesh colored; there is general induration in the area with minimal fluctuance. Psychiatric: Patient has a normal mood and affect. behavior is normal. Judgment and thought content normal.  No results found for this or any previous visit (from the past 72 hour(s)).  Assessment & Plan  1. Abscess or cellulitis of scalp - Ambulatory referral to General Surgery - stat referral for evaluation of possible scalp abscess; did discuss case with Dr. Carlynn Purl who assisted in the evaluation of the patient today.  If surgery unable to see, we will refer to ENT for same-day appt.  -Red flags and when to present for emergency care  or RTC including fever >101.67F, chest pain, shortness of breath, new/worsening/un-resolving symptoms, reviewed with patient at time of visit. Follow up and care instructions discussed and provided in AVS.

## 2019-04-14 NOTE — Patient Instructions (Signed)
STOP Valacyclovir.

## 2019-04-14 NOTE — Patient Instructions (Addendum)
Do not wash your hair for about a week. May use a dry shampoo instead.  We placed steri strips, they will start to come off in a week.  May use heat to the inflammation behind your ear.   Complete your medications.   You may have some drainage from the area, that is normal. If you have some bleeding you can hold pressure to the area until it stops.

## 2019-04-14 NOTE — Progress Notes (Signed)
Subjective:     Patient ID: Sara Andrews, female   DOB: April 02, 1956, 63 y.o.   MRN: [id number]  She was referred to us today as an urgent add-on by Sara SmallEmily Boyce, FNP.  The patient states that on Saturday, she noticed a Andrews lump on the left side of her head.  It has subsequently become larger and more painful.  She also reports some tenderness and erythema behind her left ear.  She describes the pain as burning.  It radiates from the lesion on her head down behind her ear.  She says that she has tried multiple interventions, including heat, cold, and nonsteroidal anti-inflammatories.  She had a telemedicine visit with Ms. Andrews yesterday and there was concerned that this could represent shingles.  She was initiated on Valtrex and doxycycline.  Today, she had an in person visit with Ms. Andrews.  The lump on her head seems to be consistent with an abscess and so general surgery was contacted to see if we could see her today.  [name] denies any fevers or chills.  No nausea or vomiting.  She complains of significant pain in the area of the lesion and behind her ear.  Past Medical History:  Diagnosis Date  . Anxiety   . Arthritis    "hips, hands, back" (03/10/2016)  . Asthma   . Chronic bronchitis (HCC)   . COPD (chronic obstructive pulmonary disease) (HCC)   . Cough   . Depression   . Esophagitis, reflux   . GERD (gastroesophageal reflux disease)   . Hyperlipidemia   . Hypothyroidism   . IBS (irritable bowel syndrome)   . Lumbago   . Muscle pain   . Osteoarthritis   . Sinus disorder   . Thyroid disease   . Type 2 diabetes, diet controlled (HCC)   . Uncomplicated herpes simplex    Past Surgical History:  Procedure Laterality Date  . ABDOMINAL HYSTERECTOMY    . CARPAL TUNNEL RELEASE Right   . LUMBAR LAMINECTOMY  03/16/2018   L4-5 Laminectomy & Fusion w/ Pedicle Screws, TLIF & Allograft; Surgeon: Virl DiamondMax William Cohen, MD; Location: [facility] MAIN OR; Service: Orthopedics; Laterality:  N/A; Prone, ProAxis, Stim Neuromonitoring, O-Arm, Cell Saver, [place], [employer], Justin, PACS on [facility]    . OOPHORECTOMY    . ORIF TIBIA PLATEAU Left 03/10/2016   Procedure: OPEN REDUCTION INTERNAL FIXATION (ORIF) BICONDYLAR TIBIAL PLATEAU FRACTURE;  Surgeon: Eldred MangesMark C Yates, MD;  Location: [facility] OR;  Service: Orthopedics;  Laterality: Left;  . SHOULDER ARTHROSCOPY W/ ROTATOR CUFF REPAIR Right   . TONSILLECTOMY AND ADENOIDECTOMY     Family History  Problem Relation Age of Onset  . Hypertension Daughter   . Breast cancer Daughter   . Diabetes Father   . Heart disease Father   . Breast cancer Maternal Aunt 60  . Breast cancer Paternal Aunt [id number]  . Depression Sister   . Alcohol abuse Sister   . Anxiety disorder Sister   . Bipolar disorder Sister   . Pneumonia Sister   . Skin cancer Daughter   . Anxiety disorder Daughter    Social History   Tobacco Use  . Smoking status: Former Smoker    Packs/day: 2.00    Years: 30.00    Pack years: 60.00    Quit date: 09/03/2008    Years since quitting: 10.6  . Smokeless tobacco: Never Used  Substance Use Topics  . Alcohol use: No  . Drug use: No   Current Meds  Medication  Sig  . acyclovir (ZOVIRAX) 400 MG tablet Take one tablet by mouth daily  . albuterol (PROVENTIL) (2.5 MG/3ML) 0.083% nebulizer solution Take 3 mLs (2.5 mg total) by nebulization every 6 (six) hours as needed for wheezing or shortness of breath.  . baclofen (LIORESAL) 10 MG tablet Take 1 tablet by mouth daily as needed.  Marland Kitchen buPROPion (WELLBUTRIN XL) 150 MG 24 hr tablet Take 1 tablet (150 mg total) by mouth daily.  . Cholecalciferol (VITAMIN D PO) Take 1 capsule by mouth daily.  Marland Kitchen doxycycline (VIBRA-TABS) 100 MG tablet Take 1 tablet (100 mg total) by mouth 2 (two) times daily for 7 days.  . DULoxetine (CYMBALTA) 30 MG capsule Take 1-2 capsules (30-60 mg total) by mouth daily.  . fluocinonide (LIDEX) 0.05 % external solution APP EXT AA BID  . levothyroxine (SYNTHROID) 88 MCG tablet  Take 1 tablet (88 mcg total) by mouth daily before breakfast. And only half on Sundays recheck in 6 weeks  . Magnesium Oxide 400 MG CAPS Take 1 capsule (400 mg total) by mouth once.  Marland Kitchen omeprazole (PRILOSEC) 40 MG capsule TAKE 1 CAPSULE(40 MG) BY MOUTH DAILY  . OZEMPIC, 0.25 OR 0.5 MG/DOSE, 2 MG/1.5ML SOPN INJECT 0.5 MG UNDER THE SKIN ONCE A WEEK  . PROAIR HFA 108 (90 Base) MCG/ACT inhaler INHALE 2 PUFFS INTO THE LUNGS EVERY 6 HOURS AS NEEDED FOR WHEEZING OR SHORTNESS OF BREATH  . rosuvastatin (CRESTOR) 20 MG tablet TAKE 1 TABLET BY MOUTH DAILY  . SUMAtriptan (IMITREX) 25 MG tablet May repeat in 2 hours if headache persists or recurs. Do not take more than 3 in 24 hours.  . terbinafine (LAMISIL) 250 MG tablet Take 1 tablet (250 mg total) by mouth daily.  . traZODone (DESYREL) 100 MG tablet TAKE 1 TABLET BY MOUTH AT BEDTIME  . umeclidinium-vilanterol (ANORO ELLIPTA) 62.5-25 MCG/INH AEPB Inhale 1 puff into the lungs daily.  . valACYclovir (VALTREX) 1000 MG tablet Take 1 tablet (1,000 mg total) by mouth 3 (three) times daily for 7 days.   No Known Allergies   Review of Systems  All other systems reviewed and are negative.  Today's Vitals   04/14/19 1237  BP: 115/75  Pulse: 79  Temp: 97.7 F (36.5 C)  SpO2: 98%  Weight: 134 lb 6.4 oz (61 kg)  Height: 4\' 11"  (1.499 m)   Body mass index is 27.15 kg/m.     Objective:   Physical Exam Constitutional:      Comments: She is tearful and holding the left side of her head.  HENT:     Head:      Comments: There is a 1 cm raised area on the scalp with a scab in the center.  It is exquisitely tender to palpation.  It is fluctuant.  It is well-circumscribed.  There is erythema behind the ear without significant swelling or induration.  This entire area is quite tender.  I do not appreciate any blisters.    Nose:     Comments: Covered with a mask secondary to COVID-19 precautions    Mouth/Throat:     Comments: Covered with a mask secondary to  COVID-19 precautions Eyes:     General: No scleral icterus.       Right eye: No discharge.        Left eye: No discharge.  Neck:     Musculoskeletal: Normal range of motion.  Cardiovascular:     Rate and Rhythm: Normal rate and regular rhythm.  Pulmonary:  Effort: Pulmonary effort is normal. No respiratory distress.  Abdominal:     General: Abdomen is flat.  Genitourinary:    Comments: Deferred Musculoskeletal:     Right lower leg: No edema.     Left lower leg: No edema.  Neurological:     General: No focal deficit present.  Psychiatric:        Mood and Affect: Mood normal.        Behavior: Behavior normal.        Assessment:     This is a 63 year old woman with what appears to be a scalp abscess.  With the scabbing in the center, it may be secondary to an insect or arachnoid bite.  I do not see any evidence of necrotizing tissue.  The pain that she describes does sound consistent with shingles, but I do not see any blisters or herpetic-type lesions in the area.    Plan:     I performed a bedside I&D of the scalp abscess.  There was purulent material obtained which was sent for culture.  Due to the Andrews size of the incision, I did not suture it, but rather placed Steri-Strips to approximate the skin edges.  She should continue doxycycline and valacyclovir for now.  I will contact her with culture data if they indicate a change in antibiotics is necessary.  We will have her follow-up in our clinic next week for reevaluation of the site.     Incision and Drainage Procedure Note  Pre-operative Diagnosis: Abscess of left scalp  Post-operative Diagnosis: same  Indications: Painful, fluctuant lesion on the left scalp  Anesthesia: 1% lidocaine with epinephrine  Procedure Details  The procedure, risks and complications have been discussed in detail (including, but not limited to airway compromise, infection, bleeding) with the patient, and the patient has signed consent to  the procedure.  The skin was sterilely prepped and draped over the affected area in the usual fashion. After adequate local anesthesia, I&D with a #11 blade was performed on the left parietal scalp. Purulent drainage: present and sampled for culture. The patient was observed until stable.  Findings: Andrews amount of purulent material.  No caseous matter suggestive of a sebaceous cyst.  EBL: <1 cc's  Drains: None  Condition: Tolerated procedure well   Complications: none.

## 2019-04-18 ENCOUNTER — Other Ambulatory Visit: Payer: Self-pay | Admitting: Family Medicine

## 2019-04-18 DIAGNOSIS — F325 Major depressive disorder, single episode, in full remission: Secondary | ICD-10-CM

## 2019-04-18 LAB — ANAEROBIC AND AEROBIC CULTURE

## 2019-04-19 NOTE — Telephone Encounter (Signed)
She is taking 2 tablets. 60 mg. She would like to just take 1 tablet daily 60 mg.

## 2019-04-21 ENCOUNTER — Encounter: Payer: Self-pay | Admitting: General Surgery

## 2019-04-21 ENCOUNTER — Ambulatory Visit (INDEPENDENT_AMBULATORY_CARE_PROVIDER_SITE_OTHER): Payer: BLUE CROSS/BLUE SHIELD | Admitting: General Surgery

## 2019-04-21 ENCOUNTER — Other Ambulatory Visit: Payer: Self-pay

## 2019-04-21 VITALS — BP 106/72 | HR 100 | Temp 97.9°F | Resp 14 | Ht <= 58 in | Wt 133.8 lb

## 2019-04-21 DIAGNOSIS — L02811 Cutaneous abscess of head [any part, except face]: Secondary | ICD-10-CM

## 2019-04-21 NOTE — Patient Instructions (Signed)
Please call with any questions or concerns.   You may wash your hair.

## 2019-04-21 NOTE — Progress Notes (Signed)
Sara Andrews is here today for a wound check.  She underwent incision and drainage of a scalp abscess last week.  Cultures taken showed nafcillin sensitive staph aureus.  It was also sensitive to tetracyclines, which she had been prescribed by her primary provider.  Today, she states that she has been doing very well and that the drainage relieves the pain she was experiencing.  No fevers or chills.  She took her last doxycycline dose this morning.  Today's Vitals   04/21/19 1123  BP: 106/72  Pulse: 100  Resp: 14  Temp: 97.9 F (36.6 C)  TempSrc: Temporal  SpO2: 98%  Weight: 133 lb 12.8 oz (60.7 kg)  Height: 4\' 9"  (1.448 m)  PainSc: 0-No pain   Body mass index is 28.95 kg/m. Focused scalp examination: The incision site is nicely approximated with Steri-Strips.  There is no erythema, induration, or purulent drainage identified.  The area that had been exquisitely tender last week is no longer painful.  Impression and plan: Sara Andrews is a 63 year old woman who underwent I&D of a scalp abscess last week.  She is doing well and the infection seems to have resolved.  She may wash her hair.  The Steri-Strips will fall off on their own.  We will see her on an as-needed basis.

## 2019-05-12 ENCOUNTER — Other Ambulatory Visit: Payer: Self-pay | Admitting: Family Medicine

## 2019-05-12 ENCOUNTER — Ambulatory Visit: Payer: BLUE CROSS/BLUE SHIELD

## 2019-05-12 DIAGNOSIS — J3089 Other allergic rhinitis: Secondary | ICD-10-CM

## 2019-05-12 LAB — HEPATIC FUNCTION PANEL
ALT: 24 IU/L (ref 0–32)
AST: 21 IU/L (ref 0–40)
Albumin: 3.8 g/dL (ref 3.8–4.8)
Alkaline Phosphatase: 88 IU/L (ref 39–117)
Bilirubin Total: 0.3 mg/dL (ref 0.0–1.2)
Bilirubin, Direct: 0.14 mg/dL (ref 0.00–0.40)
Total Protein: 5.4 g/dL — ABNORMAL LOW (ref 6.0–8.5)

## 2019-05-12 LAB — TSH: TSH: 0.124 u[IU]/mL — ABNORMAL LOW (ref 0.450–4.500)

## 2019-05-13 ENCOUNTER — Telehealth: Payer: Self-pay | Admitting: *Deleted

## 2019-05-13 ENCOUNTER — Other Ambulatory Visit: Payer: Self-pay | Admitting: Family Medicine

## 2019-05-13 MED ORDER — MONTELUKAST SODIUM 10 MG PO TABS: 10.0000 mg | ORAL_TABLET | Freq: Every day | ORAL | 2 refills | Status: DC

## 2019-05-13 NOTE — Telephone Encounter (Signed)
Patient is requesting refill for montelukast 10 mg tabs that was recently discontinued per 03/09/19 OV note due to patient reported she was not taking it any longer. Patient is reporting she has been taking this medication daily for years. Please advise. Pharmacy on file.

## 2019-05-13 NOTE — Addendum Note (Signed)
Addended by: Inda Coke on: 05/13/2019 03:52 PM   Modules accepted: Orders

## 2019-05-13 NOTE — Telephone Encounter (Signed)
Copied from [place] [id number]. Topic: General - Inquiry >> May 12, 2019  3:26 PM Rayann Heman wrote: Reason for CRM: pt called and stated that she missed a phone call regarding recent labs. Please advise

## 2019-05-13 NOTE — Telephone Encounter (Signed)
Reviewed lab results and physician's note with patient. She would like the Levothyroxine 75 mcg tabs called in to her pharmacy on file, please.

## 2019-06-07 ENCOUNTER — Other Ambulatory Visit: Payer: Self-pay

## 2019-06-07 DIAGNOSIS — Z20822 Contact with and (suspected) exposure to covid-19: Secondary | ICD-10-CM

## 2019-06-09 LAB — NOVEL CORONAVIRUS, NAA: SARS-CoV-2, NAA: NOT DETECTED

## 2019-06-17 ENCOUNTER — Other Ambulatory Visit: Payer: Self-pay | Admitting: Family Medicine

## 2019-06-17 DIAGNOSIS — E038 Other specified hypothyroidism: Secondary | ICD-10-CM

## 2019-06-17 DIAGNOSIS — Z79899 Other long term (current) drug therapy: Secondary | ICD-10-CM

## 2019-07-09 ENCOUNTER — Other Ambulatory Visit: Payer: Self-pay | Admitting: Family Medicine

## 2019-07-09 DIAGNOSIS — F5105 Insomnia due to other mental disorder: Secondary | ICD-10-CM

## 2019-07-09 DIAGNOSIS — E038 Other specified hypothyroidism: Secondary | ICD-10-CM

## 2019-07-10 NOTE — Telephone Encounter (Signed)
Requested medication (s) are due for refill today: yes  Requested medication (s) are on the active medication list: yes  Last refill:  04/10/2019  Future visit scheduled: yes  Notes to clinic:  Patient due for recheck on labs   Requested Prescriptions  Pending Prescriptions Disp Refills   levothyroxine (SYNTHROID) 88 MCG tablet [Pharmacy Med Name: LEVOTHYROXINE 0.088MG  (88MCG) TAB] 90 tablet 0    Sig: TAKE 1 TABLET BY MOUTH DAILY BEFORE BREAKFAST. AND ONLY HALF ON SUNDAYS RECHECK IN 6 WEEKS      Endocrinology:  Hypothyroid Agents Failed - 07/09/2019  7:06 AM      Failed - TSH needs to be rechecked within 3 months after an abnormal result. Refill until TSH is due.      Failed - TSH in normal range and within 360 days    TSH  Date Value Ref Range Status  05/11/2019 0.124 (L) 0.450 - 4.500 uIU/mL Final          Passed - Valid encounter within last 12 months    Recent Outpatient Visits           2 months ago Abscess or cellulitis of scalp   [place], FNP   2 months ago Herpes zoster with complication   [place], FNP   4 months ago Major depression in remission [facility])   [facility] Steele Sizer, MD   8 months ago Pruritus of scalp   [facility] [place], Drue Stager, MD   10 months ago Diabetes mellitus type 2 in obese [facility])   [facility] Steele Sizer, MD       Future Appointments             In 2 days Steele Sizer, MD [facility], [place]   In 2 months Steele Sizer, MD [facility], [facility]             Signed Prescriptions Disp Refills   traZODone (DESYREL) 100 MG tablet 90 tablet 0    Sig: TAKE 1 TABLET BY MOUTH AT BEDTIME      Psychiatry: Antidepressants - Serotonin Modulator Passed - 07/09/2019  7:06 AM      Passed - Completed PHQ-2 or PHQ-9 in the last 360 days.      Passed - Valid  encounter within last 6 months    Recent Outpatient Visits           2 months ago Abscess or cellulitis of scalp   [place], FNP   2 months ago Herpes zoster with complication   [place], FNP   4 months ago Major depression in remission [facility])   [facility] Steele Sizer, MD   8 months ago Pruritus of scalp   [facility] [place], Drue Stager, MD   10 months ago Diabetes mellitus type 2 in obese [facility])   [facility] Steele Sizer, MD       Future Appointments             In 2 days Steele Sizer, MD [facility], [place]   In 2 months Steele Sizer, MD [facility], [facility]

## 2019-07-12 ENCOUNTER — Other Ambulatory Visit: Payer: Self-pay

## 2019-07-12 ENCOUNTER — Encounter: Payer: Self-pay | Admitting: Family Medicine

## 2019-07-12 ENCOUNTER — Telehealth: Payer: Self-pay | Admitting: *Deleted

## 2019-07-12 ENCOUNTER — Other Ambulatory Visit: Payer: Self-pay | Admitting: Family Medicine

## 2019-07-12 ENCOUNTER — Encounter: Payer: Self-pay | Admitting: *Deleted

## 2019-07-12 ENCOUNTER — Ambulatory Visit (INDEPENDENT_AMBULATORY_CARE_PROVIDER_SITE_OTHER): Payer: Medicare Other | Admitting: Family Medicine

## 2019-07-12 VITALS — BP 104/70 | HR 94 | Temp 97.1°F | Resp 16 | Ht <= 58 in | Wt 135.2 lb

## 2019-07-12 DIAGNOSIS — Z23 Encounter for immunization: Secondary | ICD-10-CM

## 2019-07-12 DIAGNOSIS — Z0000 Encounter for general adult medical examination without abnormal findings: Secondary | ICD-10-CM | POA: Diagnosis not present

## 2019-07-12 DIAGNOSIS — Z1231 Encounter for screening mammogram for malignant neoplasm of breast: Secondary | ICD-10-CM | POA: Diagnosis not present

## 2019-07-12 DIAGNOSIS — E2839 Other primary ovarian failure: Secondary | ICD-10-CM

## 2019-07-12 DIAGNOSIS — E038 Other specified hypothyroidism: Secondary | ICD-10-CM

## 2019-07-12 DIAGNOSIS — Z87891 Personal history of nicotine dependence: Secondary | ICD-10-CM | POA: Diagnosis not present

## 2019-07-12 MED ORDER — LEVOTHYROXINE SODIUM 75 MCG PO TABS: 75.0000 ug | ORAL_TABLET | Freq: Every day | ORAL | 1 refills | Status: DC

## 2019-07-12 NOTE — Patient Instructions (Signed)
Preventive Care 73 Years and Older, Female Preventive care refers to lifestyle choices and visits with your health care provider that can promote health and wellness. This includes:  A yearly physical exam. This is also called an annual well check.  Regular dental and eye exams.  Immunizations.  Screening for certain conditions.  Healthy lifestyle choices, such as diet and exercise. What can I expect for my preventive care visit? Physical exam Your health care provider will check:  Height and weight. These may be used to calculate body mass index (BMI), which is a measurement that tells if you are at a healthy weight.  Heart rate and blood pressure.  Your skin for abnormal spots. Counseling Your health care provider may ask you questions about:  Alcohol, tobacco, and drug use.  Emotional well-being.  Home and relationship well-being.  Sexual activity.  Eating habits.  History of falls.  Memory and ability to understand (cognition).  Work and work Statistician.  Pregnancy and menstrual history. What immunizations do I need?  Influenza (flu) vaccine  This is recommended every year. Tetanus, diphtheria, and pertussis (Tdap) vaccine  You may need a Td booster every 10 years. Varicella (chickenpox) vaccine  You may need this vaccine if you have not already been vaccinated. Zoster (shingles) vaccine  You may need this after age 65. Pneumococcal conjugate (PCV13) vaccine  One dose is recommended after age 65. Pneumococcal polysaccharide (PPSV23) vaccine  One dose is recommended after age 65. Measles, mumps, and rubella (MMR) vaccine  You may need at least one dose of MMR if you were born in 1957 or later. You may also need a second dose. Meningococcal conjugate (MenACWY) vaccine  You may need this if you have certain conditions. Hepatitis A vaccine  You may need this if you have certain conditions or if you travel or work in places where you may be exposed  to hepatitis A. Hepatitis B vaccine  You may need this if you have certain conditions or if you travel or work in places where you may be exposed to hepatitis B. Haemophilus influenzae type b (Hib) vaccine  You may need this if you have certain conditions. You may receive vaccines as individual doses or as more than one vaccine together in one shot (combination vaccines). Talk with your health care provider about the risks and benefits of combination vaccines. What tests do I need? Blood tests  Lipid and cholesterol levels. These may be checked every 5 years, or more frequently depending on your overall health.  Hepatitis C test.  Hepatitis B test. Screening  Lung cancer screening. You may have this screening every year starting at age 45 if you have a 30-pack-year history of smoking and currently smoke or have quit within the past 15 years.  Colorectal cancer screening. All adults should have this screening starting at age 65 and continuing until age 65. Your health care provider may recommend screening at age 16 if you are at increased risk. You will have tests every 1-10 years, depending on your results and the type of screening test.  Diabetes screening. This is done by checking your blood sugar (glucose) after you have not eaten for a while (fasting). You may have this done every 1-3 years.  Mammogram. This may be done every 1-2 years. Talk with your health care provider about how often you should have regular mammograms.  BRCA-related cancer screening. This may be done if you have a family history of breast, ovarian, tubal, or peritoneal cancers.  Other tests  Sexually transmitted disease (STD) testing.  Bone density scan. This is done to screen for osteoporosis. You may have this done starting at age 65. Follow these instructions at home: Eating and drinking  Eat a diet that includes fresh fruits and vegetables, whole grains, lean protein, and low-fat dairy products. Limit  your intake of foods with high amounts of sugar, saturated fats, and salt.  Take vitamin and mineral supplements as recommended by your health care provider.  Do not drink alcohol if your health care provider tells you not to drink.  If you drink alcohol: ? Limit how much you have to 0-1 drink a day. ? Be aware of how much alcohol is in your drink. In the [place] one drink equals one 12 oz bottle of beer (355 mL), one 5 oz glass of wine (148 mL), or one 1 oz glass of hard liquor (44 mL). Lifestyle  Take daily care of your teeth and gums.  Stay active. Exercise for at least 30 minutes on 5 or more days each week.  Do not use any products that contain nicotine or tobacco, such as cigarettes, e-cigarettes, and chewing tobacco. If you need help quitting, ask your health care provider.  If you are sexually active, practice safe sex. Use a condom or other form of protection in order to prevent STIs (sexually transmitted infections).  Talk with your health care provider about taking a low-dose aspirin or statin. What's next?  Go to your health care provider once a year for a well check visit.  Ask your health care provider how often you should have your eyes and teeth checked.  Stay up to date on all vaccines. This information is not intended to replace advice given to you by your health care provider. Make sure you discuss any questions you have with your health care provider. Document Released: 08/10/2015 Document Revised: 07/08/2018 Document Reviewed: 07/08/2018 [employer] Patient Education  2020 [employer]  

## 2019-07-12 NOTE — Telephone Encounter (Signed)
Received referral for low dose lung cancer screening CT scan. Message left at phone number listed in EMR for patient to call me back to facilitate scheduling scan.  

## 2019-07-12 NOTE — Progress Notes (Signed)
Patient: Sara Andrews, Female    DOB: February 23, 1956, 63 y.o.   MRN: [id number]  Visit Date: 07/12/2019  Today's Provider: Ruel Favors, MD   Chief Complaint  Patient presents with  . Medicare Wellness    Subjective:    HPI Sara Andrews is a 63 y.o. female who presents today for her Subsequent Annual Wellness Visit.  Patient/Caregiver input:    Review of Systems  Constitutional: Negative for fever or weight change.  Respiratory: Negative for cough and shortness of breath.   Cardiovascular: Negative for chest pain or palpitations.  Gastrointestinal: Negative for abdominal pain, no bowel changes.  Musculoskeletal: Negative for gait problem or joint swelling.  Skin: Negative for rash.  Neurological: Positive  for dizziness but no  headache.  No other specific complaints in a complete review of systems (except as listed in HPI above).  Past Medical History:  Diagnosis Date  . Anxiety   . Arthritis    "hips, hands, back" (03/10/2016)  . Asthma   . Chronic bronchitis (HCC)   . COPD (chronic obstructive pulmonary disease) (HCC)   . Cough   . Depression   . Esophagitis, reflux   . GERD (gastroesophageal reflux disease)   . Hyperlipidemia   . Hypothyroidism   . IBS (irritable bowel syndrome)   . Lumbago   . Muscle pain   . Osteoarthritis   . Sinus disorder   . Thyroid disease   . Type 2 diabetes, diet controlled (HCC)   . Uncomplicated herpes simplex     Past Surgical History:  Procedure Laterality Date  . ABDOMINAL HYSTERECTOMY    . CARPAL TUNNEL RELEASE Right   . LUMBAR LAMINECTOMY  03/16/2018   L4-5 Laminectomy & Fusion w/ Pedicle Screws, TLIF & Allograft; Surgeon: Virl Diamond, MD; Location: [facility] MAIN OR; Service: Orthopedics; Laterality: N/A; Prone, ProAxis, Stim Neuromonitoring, O-Arm, Cell Saver, [employer], [employer], Justin, PACS on [facility]    . OOPHORECTOMY    . ORIF TIBIA PLATEAU Left 03/10/2016   Procedure: OPEN REDUCTION INTERNAL FIXATION  (ORIF) BICONDYLAR TIBIAL PLATEAU FRACTURE;  Surgeon: Eldred Manges, MD;  Location: [facility] OR;  Service: Orthopedics;  Laterality: Left;  . SHOULDER ARTHROSCOPY W/ ROTATOR CUFF REPAIR Right   . TONSILLECTOMY AND ADENOIDECTOMY      Family History  Problem Relation Age of Onset  . Hypertension Daughter   . Breast cancer Daughter   . Diabetes Father   . Heart disease Father   . Breast cancer Maternal Aunt 60  . Breast cancer Paternal Aunt 17  . Depression Sister   . Alcohol abuse Sister   . Anxiety disorder Sister   . Bipolar disorder Sister   . Pneumonia Sister   . Skin cancer Daughter   . Anxiety disorder Daughter     Social History   Socioeconomic History  . Marital status: Widowed    Spouse name: Not on file  . Number of children: 3  . Years of education: Not on file  . Highest education level: 10th grade  Occupational History  . Occupation: disabled    Comment: retired  Tobacco Use  . Smoking status: Former Smoker    Packs/day: 2.00    Years: 30.00    Pack years: 60.00    Quit date: 09/03/2008    Years since quitting: 10.8  . Smokeless tobacco: Never Used  Substance and Sexual Activity  . Alcohol use: No  . Drug use: No  . Sexual activity: Never  Other Topics Concern  .  Not on file  Social History Narrative   She used to work at  [employer], but had a left knee work related injury 03/10/2016 , require left knee surgery and is now having back pain, that is getting evaluated, Out of work since.       Patient recently found out that one of her twins [name]) was diagnosed with breast cancer); will be having a double mastectomy soon.      Social Determinants of Health   Financial Resource Strain: Low Risk   . Difficulty of Paying Living Expenses: Not hard at all  Food Insecurity: No Food Insecurity  . Worried About Charity fundraiser in the Last Year: Never true  . Ran Out of Food in the Last Year: Never true  Transportation Needs: No Transportation Needs  .  Lack of Transportation (Medical): No  . Lack of Transportation (Non-Medical): No  Physical Activity: Sufficiently Active  . Days of Exercise per Week: 7 days  . Minutes of Exercise per Session: 40 min  Stress: No Stress Concern Present  . Feeling of Stress : Not at all  Social Connections: Not Isolated  . Frequency of Communication with Friends and Family: More than three times a week  . Frequency of Social Gatherings with Friends and Family: More than three times a week  . Attends Religious Services: More than 4 times per year  . Active Member of Clubs or Organizations: Yes  . Attends Archivist Meetings: More than 4 times per year  . Marital Status: Married  Human resources officer Violence: Not At Risk  . Fear of Current or Ex-Partner: No  . Emotionally Abused: No  . Physically Abused: No  . Sexually Abused: No    Outpatient Encounter Medications as of 07/12/2019  Medication Sig  . acyclovir (ZOVIRAX) 400 MG tablet Take one tablet by mouth daily  . albuterol (PROVENTIL) (2.5 MG/3ML) 0.083% nebulizer solution Take 3 mLs (2.5 mg total) by nebulization every 6 (six) hours as needed for wheezing or shortness of breath.  . baclofen (LIORESAL) 10 MG tablet Take 1 tablet by mouth daily as needed.  Marland Kitchen buPROPion (WELLBUTRIN XL) 150 MG 24 hr tablet Take 1 tablet (150 mg total) by mouth daily.  . Cholecalciferol (VITAMIN D PO) Take 1 capsule by mouth daily.  . DULoxetine (CYMBALTA) 60 MG capsule Take 1 capsule (60 mg total) by mouth daily.  . fluocinonide (LIDEX) 0.05 % external solution APP EXT AA BID  . levothyroxine (SYNTHROID) 88 MCG tablet Take 1 tablet (88 mcg total) by mouth daily before breakfast. And only half on Sundays recheck in 6 weeks  . Magnesium Oxide 400 MG CAPS Take 1 capsule (400 mg total) by mouth once.  . montelukast (SINGULAIR) 10 MG tablet Take 1 tablet (10 mg total) by mouth at bedtime.  Marland Kitchen omeprazole (PRILOSEC) 40 MG capsule TAKE 1 CAPSULE(40 MG) BY MOUTH DAILY  .  OZEMPIC, 0.25 OR 0.5 MG/DOSE, 2 MG/1.5ML SOPN INJECT 0.5 MG UNDER THE SKIN ONCE A WEEK  . PROAIR HFA 108 (90 Base) MCG/ACT inhaler INHALE 2 PUFFS INTO THE LUNGS EVERY 6 HOURS AS NEEDED FOR WHEEZING OR SHORTNESS OF BREATH  . rosuvastatin (CRESTOR) 20 MG tablet TAKE 1 TABLET BY MOUTH DAILY  . SUMAtriptan (IMITREX) 25 MG tablet May repeat in 2 hours if headache persists or recurs. Do not take more than 3 in 24 hours.  . terbinafine (LAMISIL) 250 MG tablet Take 1 tablet (250 mg total) by mouth daily.  Marland Kitchen  traZODone (DESYREL) 100 MG tablet TAKE 1 TABLET BY MOUTH AT BEDTIME  . umeclidinium-vilanterol (ANORO ELLIPTA) 62.5-25 MCG/INH AEPB Inhale 1 puff into the lungs daily.   No facility-administered encounter medications on file as of 07/12/2019.    No Known Allergies  Care Team Updated in EHR: Yes  Last Vision Exam:  In 2019 Wears corrective lenses: Yes Last Dental Exam: she has a plate on upper and just got some dental coverage and will go back to check on lower teeth  Last Hearing Exam: Wears Hearing Aids: No  Functional Ability / Safety Screening 1.  Was the timed Get Up and Go test shorter than 30 seconds?  yes 2.  Does the patient need help with the phone, transportation, shopping,      preparing meals, housework, laundry, medications, or managing money?  no 3.  Is the patient's home free of loose throw rugs in walkways, pet beds, electrical cords, etc?   yes      Grab bars in the bathroom? yes      Handrails on the stairs?   yes      Adequate lighting?   yes 4.  Has the patient noticed any hearing difficulties?   no  Diet Recall and Exercise Regimen: she is active, she has good amount of calcium in her diet, weight has been stable.   Advanced Care Planning: A voluntary discussion about advance care planning including the explanation and discussion of advance directives.  Discussed health care proxy and Living will, and the patient was able to identify a health care proxy as  Daughter  Gean QuintChristie Williams .  Patient does not have a living will at present time.  Does patient have a HCPOA?    no If yes, name and contact information: N/A Does patient have a living will or MOST form?  no  Cancer Screenings: Skin: discussed atypical lesions, she has senile purpura on both arms Lung: Low Dose CT Chest recommended if Age 25-80 years, 30 pack-year currently smoking OR have quit w/in 15years. Patient does qualify. Breast: Up to date on Mammogram? Yes  Up to date of Bone Density/Dexa? No Colon: repeat cologuard in 2022   Additional Screenings:  Hepatitis B/HIV/Syphillis: N/A Hepatitis C Screening: negative hepatitis C screen  Intimate Partner Violence: negative screen   Objective:   Vitals: BP 104/70 (BP Location: Left Arm, Patient Position: Sitting, Cuff Size: Normal)   Pulse 94   Temp (!) 97.1 F (36.2 C) (Temporal)   Resp 16   Ht 4\' 9"  (1.448 m)   Wt 135 lb 3.2 oz (61.3 kg)   SpO2 96%   BMI 29.26 kg/m  Body mass index is 29.26 kg/m.     Hearing Screening   125Hz  250Hz  500Hz  1000Hz  2000Hz  3000Hz  4000Hz  6000Hz  8000Hz   Right ear:   Pass Pass Pass  Pass    Left ear:             Visual Acuity Screening   Right eye Left eye Both eyes  Without correction:     With correction: 20/20 20/25 20/25     Physical Exam   Constitutional: Patient appears well-developed and well-nourished.  No distress.  HEENT: head atraumatic, normocephalic, pupils equal and reactive to light, Cardiovascular: Normal rate, regular rhythm and normal heart sounds.  No murmur heard. No BLE edema. Pulmonary/Chest: Effort normal and breath sounds normal. No respiratory distress. Abdominal: Soft.  There is no tenderness. Skin: senile purpura on both arms Psychiatric: Patient has a normal mood and  affect. behavior is normal. Judgment and thought content normal.  Cognitive Testing - 6-CIT  Correct? Score   What year is it? yes 0 Yes = 0    No = 4  What month is it? yes 0 Yes = 0    No = 3   Remember:     Floyde Parkins, [address], [place]     What time is it? yes 0 Yes = 0    No = 3  Count backwards from 20 to 1 yes 0 Correct = 0    1 error = 2   More than 1 error = 4  Say the months of the year in reverse. yes 4 Correct = 0    1 error = 2   More than 1 error = 4  What address did I ask you to remember? yes 2 Correct = 0  1 error = 2    2 error = 4    3 error = 6    4 error = 8    All wrong = 10       TOTAL SCORE  6/28   Interpretation:  Normal  Normal (0-7) Abnormal (8-28)   Fall Risk: Fall Risk  07/12/2019 04/21/2019 04/14/2019 04/14/2019 04/13/2019  Falls in the past year? 0 0 0 0 0  Number falls in past yr: 0 - 0 0 0  Injury with Fall? 0 - 0 0 0  Comment - - - - -  Follow up - - - Falls evaluation completed Falls evaluation completed    Depression Screen Depression screen [facility] 2/9 07/12/2019 04/14/2019 04/13/2019 03/09/2019 11/01/2018  Decreased Interest 0 0 0 0 1  Down, Depressed, Hopeless 0 0 0 0 0  PHQ - 2 Score 0 0 0 0 1  Altered sleeping 0 0 0 0 0  Tired, decreased energy 0 0 0 0 0  Change in appetite 0 0 0 0 0  Feeling bad or failure about yourself  0 0 0 0 1  Trouble concentrating 0 0 0 0 0  Moving slowly or fidgety/restless 0 0 0 0 0  Suicidal thoughts 0 0 0 0 0  PHQ-9 Score 0 0 0 0 2  Difficult doing work/chores - Not difficult at all Not difficult at all Not difficult at all Not difficult at all  Some recent data might be hidden    Recent Results (from the past 2160 hour(s))  Anaerobic and Aerobic Culture     Status: Abnormal   Collection Time: 04/14/19  1:03 PM   Specimen: SCALP; Abscess   ABSCESS  Result Value Ref Range   Anaerobic Culture Final report    Result 1 Comment     Comment: No anaerobic growth in 72 hours.   Aerobic Culture Final report (A)    Result 1 Staphylococcus aureus (A)     Comment: Heavy growth Based on susceptibility to oxacillin this isolate would be susceptible to: *Penicillinase-stable penicillins, such as:    Cloxacillin, Dicloxacillin, Nafcillin *Beta-lactam combination agents, such as:   Amoxicillin-clavulanic acid, Ampicillin-sulbactam,   Piperacillin-tazobactam *Oral cephems, such as:   Cefaclor, Cefdinir, Cefpodoxime, Cefprozil, Cefuroxime,   Cephalexin, Loracarbef *Parenteral cephems, such as:   Cefazolin, Cefepime, Cefotaxime, Cefotetan, Ceftaroline,   Ceftizoxime, Ceftriaxone, Cefuroxime *Carbapenems, such as:   Doripenem, Ertapenem, Imipenem, Meropenem    Antimicrobial Susceptibility Comment     Comment:       ** S = Susceptible; I = Intermediate; R = Resistant **  P = Positive; N = Negative             MICS are expressed in micrograms per mL    Antibiotic                 RSLT#1    RSLT#2    RSLT#3    RSLT#4 Ciprofloxacin                  R Clindamycin                    S Erythromycin                   R Gentamicin                     S Levofloxacin                   R Linezolid                      S Moxifloxacin                   R Oxacillin                      S Penicillin                     R Quinupristin/Dalfopristin      S Rifampin                       S Tetracycline                   S Trimethoprim/Sulfa             S Vancomycin                     S   Hepatic function panel     Status: Abnormal   Collection Time: 05/11/19  9:40 AM  Result Value Ref Range   Total Protein 5.4 (L) 6.0 - 8.5 g/dL   Albumin 3.8 3.8 - 4.8 g/dL   Bilirubin Total 0.3 0.0 - 1.2 mg/dL   Bilirubin, Direct 7.82 0.00 - 0.40 mg/dL   Alkaline Phosphatase 88 39 - 117 IU/L   AST 21 0 - 40 IU/L   ALT 24 0 - 32 IU/L  TSH     Status: Abnormal   Collection Time: 05/11/19  9:40 AM  Result Value Ref Range   TSH 0.124 (L) 0.450 - 4.500 uIU/mL  Novel Coronavirus, NAA ([facility])     Status: None   Collection Time: 06/07/19 12:00 AM   Specimen: Nasopharyngeal(NP) swabs in vial transport medium   NASOPHARYNGE  TESTING  Result Value Ref Range   SARS-CoV-2, NAA Not Detected Not  Detected    Comment: This nucleic acid amplification test was developed and its performance characteristics determined by [employer]. Nucleic acid amplification tests include PCR and TMA. This test has not been FDA cleared or approved. This test has been authorized by FDA under an Emergency Use Authorization (EUA). This test is only authorized for the duration of time the declaration that circumstances exist justifying the authorization of the emergency use of in vitro diagnostic tests for detection of SARS-CoV-2 virus and/or diagnosis of COVID-19 infection under section 564(b)(1) of the Act, 21 U.S.C. 956OZH-0(Q) (1), unless the authorization is terminated or revoked sooner.  When diagnostic testing is negative, the possibility of a false negative result should be considered in the context of a patient's recent exposures and the presence of clinical signs and symptoms consistent with COVID-19. An individual without symptoms of COVID-19 and who is not shedding SARS-CoV-2 virus would  expect to have a negative (not detected) result in this assay.     Assessment & Plan:    1. Welcome to Medicare preventive visit  - DG Bone Density; Future - MM Digital Screening; Future  2. Ovarian failure  - DG Bone Density; Future  3. Breast cancer screening by mammogram  - MM Digital Screening; Future  4. History of tobacco use  - CT CHEST LUNG CA SCREEN LOW DOSE W/O CM; Future  5. Need for 23-polyvalent pneumococcal polysaccharide vaccine  - Pneumococcal polysaccharide vaccine 23-valent greater than or equal to 2yo subcutaneous/IM Exercise Activities and Dietary recommendations  6. Other specified hypothyroidism  - levothyroxine (SYNTHROID) 75 MCG tablet; Take 1 tablet (75 mcg total) by mouth daily.  Dispense: 30 tablet; Refill: 1 she is currently taking one 88 mcg and one and half on Sundays' instead of half on Sundays we will change dose and recheck in 6 weeks   Goal:    Doing more crafts   - Discussed health benefits of physical activity, and encouraged her to engage in regular exercise appropriate for her age and condition.   Immunization History  Administered Date(s) Administered  . Influenza Inj Mdck Quad Pf 05/07/2019  . Influenza,inj,Quad PF,6+ Mos 04/28/2017, 04/12/2018  . Influenza-Unspecified 05/24/2015, 06/02/2016  . Pneumococcal Conjugate-13 04/28/2017  . Pneumococcal Polysaccharide-23 06/26/2015  . Tdap 04/28/2017  . Zoster Recombinat (Shingrix) 03/16/2019    Health Maintenance  Topic Date Due  . OPHTHALMOLOGY EXAM  05/18/2019  . DEXA SCAN  07/09/2019  . HEMOGLOBIN A1C  09/11/2019  . MAMMOGRAM  02/24/2020  . FOOT EXAM  03/08/2020  . URINE MICROALBUMIN  03/10/2020  . PNA vac Low Risk Adult (2 of 2 - PPSV23) 06/25/2020  . Fecal DNA (Cologuard)  07/22/2021  . TETANUS/TDAP  04/29/2027  . INFLUENZA VACCINE  Completed  . Hepatitis C Screening  Completed  . HIV Screening  Completed    No orders of the defined types were placed in this encounter.   Current Outpatient Medications:  .  acyclovir (ZOVIRAX) 400 MG tablet, Take one tablet by mouth daily, Disp: 30 tablet, Rfl: 0 .  albuterol (PROVENTIL) (2.5 MG/3ML) 0.083% nebulizer solution, Take 3 mLs (2.5 mg total) by nebulization every 6 (six) hours as needed for wheezing or shortness of breath., Disp: 75 mL, Rfl: 2 .  baclofen (LIORESAL) 10 MG tablet, Take 1 tablet by mouth daily as needed., Disp: , Rfl:  .  buPROPion (WELLBUTRIN XL) 150 MG 24 hr tablet, Take 1 tablet (150 mg total) by mouth daily., Disp: 90 tablet, Rfl: 1 .  Cholecalciferol (VITAMIN D PO), Take 1 capsule by mouth daily., Disp: , Rfl:  .  DULoxetine (CYMBALTA) 60 MG capsule, Take 1 capsule (60 mg total) by mouth daily., Disp: 90 capsule, Rfl: 0 .  fluocinonide (LIDEX) 0.05 % external solution, APP EXT AA BID, Disp: , Rfl: 0 .  levothyroxine (SYNTHROID) 88 MCG tablet, Take 1 tablet (88 mcg total) by mouth daily before  breakfast. And only half on Sundays recheck in 6 weeks, Disp: 90 tablet, Rfl: 0 .  Magnesium Oxide 400 MG CAPS, Take 1 capsule (400 mg total) by mouth once., Disp: 30 capsule, Rfl: 5 .  montelukast (SINGULAIR) 10 MG tablet, Take 1 tablet (10 mg total) by mouth at bedtime., Disp: 30 tablet, Rfl: 2 .  omeprazole (PRILOSEC) 40 MG capsule, TAKE 1 CAPSULE(40 MG) BY MOUTH DAILY, Disp: 90 capsule, Rfl: 0 .  OZEMPIC, 0.25 OR 0.5 MG/DOSE, 2 MG/1.5ML SOPN, INJECT 0.5 MG UNDER THE SKIN ONCE A WEEK, Disp: 9 mL, Rfl: 0 .  PROAIR HFA 108 (90 Base) MCG/ACT inhaler, INHALE 2 PUFFS INTO THE LUNGS EVERY 6 HOURS AS NEEDED FOR WHEEZING OR SHORTNESS OF BREATH, Disp: 8.5 g, Rfl: 0 .  rosuvastatin (CRESTOR) 20 MG tablet, TAKE 1 TABLET BY MOUTH DAILY, Disp: 90 tablet, Rfl: 1 .  SUMAtriptan (IMITREX) 25 MG tablet, May repeat in 2 hours if headache persists or recurs. Do not take more than 3 in 24 hours., Disp: 10 tablet, Rfl: 0 .  terbinafine (LAMISIL) 250 MG tablet, Take 1 tablet (250 mg total) by mouth daily., Disp: 30 tablet, Rfl: 2 .  traZODone (DESYREL) 100 MG tablet, TAKE 1 TABLET BY MOUTH AT BEDTIME, Disp: 90 tablet, Rfl: 0 .  umeclidinium-vilanterol (ANORO ELLIPTA) 62.5-25 MCG/INH AEPB, Inhale 1 puff into the lungs daily., Disp: 60 each, Rfl: 5 There are no discontinued medications.  I have personally reviewed and addressed the Medicare Annual Wellness health risk assessment questionnaire and have noted the following in the patient's chart:  A.         Medical and social history & family history B.         Use of alcohol, tobacco, and illicit drugs  C.         Current medications and supplements D.         Functional and Cognitive ability and status E.         Nutritional status F.         Physical activity G.        Advance directives H.         List of other physicians I.          Hospitalizations, surgeries, and ER visits in previous 12 months J.         Vitals K.         Screenings such as hearing,  vision, cognitive function, and depression L.         Referrals and appointments: see above  In addition, I have reviewed and discussed with patient certain preventive protocols, quality metrics, and best practice recommendations. A written personalized care plan for preventive services as well as general preventive health recommendations were provided to patient.   See attached scanned questionnaire for additional information.

## 2019-07-13 NOTE — Telephone Encounter (Signed)
Received referral for initial lung cancer screening scan. Contacted patient and obtained smoking history,(former, quit 09/03/08, 60 pack year) as well as answering questions related to screening process. Patient denies signs of lung cancer such as weight loss or hemoptysis. Patient denies comorbidity that would prevent curative treatment if lung cancer were found. Patient is scheduled for shared decision making visit and CT scan on 08/03/19 at 1015am.

## 2019-07-13 NOTE — Addendum Note (Signed)
Addended by: Lieutenant Diego on: 07/13/2019 03:43 PM   Modules accepted: Orders

## 2019-07-18 ENCOUNTER — Other Ambulatory Visit: Payer: Self-pay | Admitting: Family Medicine

## 2019-07-18 DIAGNOSIS — F325 Major depressive disorder, single episode, in full remission: Secondary | ICD-10-CM

## 2019-07-28 ENCOUNTER — Other Ambulatory Visit: Payer: Self-pay | Admitting: Family Medicine

## 2019-07-28 DIAGNOSIS — Z1231 Encounter for screening mammogram for malignant neoplasm of breast: Secondary | ICD-10-CM

## 2019-08-03 ENCOUNTER — Inpatient Hospital Stay: Payer: Medicare Other | Attending: Oncology | Admitting: Oncology

## 2019-08-03 ENCOUNTER — Other Ambulatory Visit: Payer: Self-pay | Admitting: Family Medicine

## 2019-08-03 ENCOUNTER — Other Ambulatory Visit: Payer: Self-pay

## 2019-08-03 ENCOUNTER — Ambulatory Visit
Admission: RE | Admit: 2019-08-03 | Discharge: 2019-08-03 | Disposition: A | Discharge status: Home / Self Care | Payer: Medicare Other | Source: Ambulatory Visit | Attending: Oncology | Admitting: Oncology

## 2019-08-03 DIAGNOSIS — I700 Atherosclerosis of aorta: Secondary | ICD-10-CM | POA: Insufficient documentation

## 2019-08-03 DIAGNOSIS — Z87891 Personal history of nicotine dependence: Secondary | ICD-10-CM | POA: Diagnosis not present

## 2019-08-03 NOTE — Progress Notes (Signed)
Virtual Visit via Video Note  I connected with Sara Andrews 08/03/19 at 10:15 AM EST by a video enabled telemedicine application and verified that I am speaking with the correct person using two identifiers.  Location: Patient: Home Provider: Home   I discussed the limitations of evaluation and management by telemedicine and the availability of in person appointments. The patient expressed understanding and agreed to proceed.  I discussed the assessment and treatment plan with the patient. The patient was provided an opportunity to ask questions and all were answered. The patient agreed with the plan and demonstrated an understanding of the instructions.   The patient was advised to call back or seek an in-person evaluation if the symptoms worsen or if the condition fails to improve as anticipated.   In accordance with CMS guidelines, patient has met eligibility criteria including age, absence of signs or symptoms of lung cancer.  Social History   Tobacco Use  . Smoking status: Former Smoker    Packs/day: 2.00    Years: 30.00    Pack years: 60.00    Quit date: 09/03/2008    Years since quitting: 10.9  . Smokeless tobacco: Never Used  Substance Use Topics  . Alcohol use: No  . Drug use: No      A shared decision-making session was conducted prior to the performance of CT scan. This includes one or more decision aids, includes benefits and harms of screening, follow-up diagnostic testing, over-diagnosis, false positive rate, and total radiation exposure.   Counseling on the importance of adherence to annual lung cancer LDCT screening, impact of co-morbidities, and ability or willingness to undergo diagnosis and treatment is imperative for compliance of the program.   Counseling on the importance of continued smoking cessation for former smokers; the importance of smoking cessation for current smokers, and information about tobacco cessation interventions have been given to patient  including [place] and 1800 quit [place] programs.   Written order for lung cancer screening with LDCT has been given to the patient and any and all questions have been answered to the best of my abilities.    Yearly follow up will be coordinated by Burgess Estelle, Thoracic Navigator.  I provided 15 minutes of face-to-face video visit time during this encounter, and > 50% was spent counseling as documented under my assessment & plan.   Jacquelin Hawking, NP

## 2019-08-05 ENCOUNTER — Telehealth: Payer: Self-pay | Admitting: *Deleted

## 2019-08-05 NOTE — Telephone Encounter (Signed)
After discussion with pulmonology, notified patient of LDCT lung cancer screening program results with recommendation for 3 month follow up imaging. Also notified of incidental findings noted below and is encouraged to discuss further with PCP who will receive a copy of this note and/or the CT report. Patient verbalizes understanding.    1. Lung-RADS 4AS, suspicious. Follow up low-dose chest CT without contrast in 3 months (please use the following order, "CT CHEST LCS NODULE FOLLOW-UP W/O CM") is recommended. Alternatively, PET may be considered when there is a solid component 84mm or larger. 2. Aortic atherosclerosis, in addition to left main and 2 vessel coronary artery disease. Please note that although the presence of coronary artery calcium documents the presence of coronary artery disease, the severity of this disease and any potential stenosis cannot be assessed on this non-gated CT examination. Assessment for potential risk factor modification, dietary therapy or pharmacologic therapy may be warranted, if clinically indicated. 3. Mild diffuse bronchial wall thickening with mild centrilobular and paraseptal emphysema; imaging findings suggestive of underlying COPD.  These results will be called to the ordering clinician or representative by the Radiologist Assistant, and communication documented in the PACS or zVision Dashboard.  Aortic Atherosclerosis (ICD10-I70.0) and Emphysema (ICD10-J43.9).

## 2019-08-08 ENCOUNTER — Other Ambulatory Visit: Payer: Self-pay | Admitting: Family Medicine

## 2019-08-08 NOTE — Telephone Encounter (Signed)
Requested Prescriptions  Pending Prescriptions Disp Refills  . montelukast (SINGULAIR) 10 MG tablet [Pharmacy Med Name: MONTELUKAST 10MG  TABLETS] 30 tablet 2    Sig: TAKE 1 TABLET(10 MG) BY MOUTH AT BEDTIME     Pulmonology:  Leukotriene Inhibitors Passed - 08/08/2019  8:13 AM      Passed - Valid encounter within last 12 months    Recent Outpatient Visits          3 weeks ago Welcome to 10/06/2019 preventive visit   [facility], MD   3 months ago Abscess or cellulitis of scalp   [facility], FNP   3 months ago Herpes zoster with complication   [facility], FNP   5 months ago Major depression in remission [facility])   [facility], MD   9 months ago Pruritus of scalp   [facility], MD      Future Appointments            In 2 weeks Alba Cory, MD [facility], [facility]   In 1 month [facility], MD [facility], [facility]

## 2019-08-11 ENCOUNTER — Ambulatory Visit
Admission: RE | Admit: 2019-08-11 | Discharge: 2019-08-11 | Disposition: A | Discharge status: Home / Self Care | Payer: Medicare Other | Source: Ambulatory Visit | Attending: Family Medicine | Admitting: Family Medicine

## 2019-08-11 DIAGNOSIS — E2839 Other primary ovarian failure: Secondary | ICD-10-CM | POA: Diagnosis present

## 2019-08-11 DIAGNOSIS — Z1231 Encounter for screening mammogram for malignant neoplasm of breast: Secondary | ICD-10-CM | POA: Diagnosis present

## 2019-08-11 DIAGNOSIS — Z0000 Encounter for general adult medical examination without abnormal findings: Secondary | ICD-10-CM

## 2019-08-19 ENCOUNTER — Other Ambulatory Visit: Payer: Self-pay | Admitting: Family Medicine

## 2019-08-19 DIAGNOSIS — E785 Hyperlipidemia, unspecified: Secondary | ICD-10-CM

## 2019-08-19 NOTE — Telephone Encounter (Signed)
Requested Prescriptions  Pending Prescriptions Disp Refills  . rosuvastatin (CRESTOR) 20 MG tablet [Pharmacy Med Name: ROSUVASTATIN 20MG  TABLETS] 90 tablet 1    Sig: TAKE 1 TABLET BY MOUTH DAILY     Cardiovascular:  Antilipid - Statins Passed - 08/19/2019 11:09 AM      Passed - Total Cholesterol in normal range and within 360 days    Cholesterol, Total  Date Value Ref Range Status  03/11/2019 105 100 - 199 mg/dL Final         Passed - LDL in normal range and within 360 days    LDL Cholesterol (Calc)  Date Value Ref Range Status  02/03/2018 39 mg/dL (calc) Final    Comment:    Reference range: <100 . Desirable range <100 mg/dL for primary prevention;   <70 mg/dL for patients with CHD or diabetic patients  with > or = 2 CHD risk factors. 04/06/2018 LDL-C is now calculated using the Martin-Hopkins  calculation, which is a validated novel method providing  better accuracy than the Friedewald equation in the  estimation of LDL-C.  Marland Kitchen et al. Horald Pollen. Lenox Ahr): 2061-2068  (http://education.[employer].com/faq/FAQ164)    LDL Calculated  Date Value Ref Range Status  03/11/2019 35 0 - 99 mg/dL Final         Passed - HDL in normal range and within 360 days    HDL  Date Value Ref Range Status  03/11/2019 60 >39 mg/dL Final         Passed - Triglycerides in normal range and within 360 days    Triglycerides  Date Value Ref Range Status  03/11/2019 49 0 - 149 mg/dL Final         Passed - Patient is not pregnant      Passed - Valid encounter within last 12 months    Recent Outpatient Visits          1 month ago Welcome to 03/13/2019 preventive visit   [facility], MD   4 months ago Abscess or cellulitis of scalp   [facility], FNP   4 months ago Herpes zoster with complication   [facility], FNP   5 months ago Major depression in remission [facility])   [facility], MD   9 months ago Pruritus of scalp   [facility], MD      Future Appointments            In 4 days Alba Cory, MD [facility], [facility]   In 3 weeks [facility], MD [facility], [facility]

## 2019-08-23 ENCOUNTER — Other Ambulatory Visit: Payer: Self-pay

## 2019-08-23 ENCOUNTER — Ambulatory Visit (INDEPENDENT_AMBULATORY_CARE_PROVIDER_SITE_OTHER): Payer: Medicare Other | Admitting: Family Medicine

## 2019-08-23 ENCOUNTER — Encounter: Payer: Self-pay | Admitting: Family Medicine

## 2019-08-23 ENCOUNTER — Other Ambulatory Visit: Payer: Self-pay | Admitting: Family Medicine

## 2019-08-23 ENCOUNTER — Telehealth: Payer: Self-pay

## 2019-08-23 ENCOUNTER — Telehealth: Payer: Self-pay | Admitting: Family Medicine

## 2019-08-23 VITALS — BP 100/70 | HR 97 | Temp 97.1°F | Resp 16 | Ht <= 58 in | Wt 138.0 lb

## 2019-08-23 DIAGNOSIS — G43009 Migraine without aura, not intractable, without status migrainosus: Secondary | ICD-10-CM

## 2019-08-23 DIAGNOSIS — E785 Hyperlipidemia, unspecified: Secondary | ICD-10-CM | POA: Diagnosis not present

## 2019-08-23 DIAGNOSIS — M7550 Bursitis of unspecified shoulder: Secondary | ICD-10-CM

## 2019-08-23 DIAGNOSIS — F325 Major depressive disorder, single episode, in full remission: Secondary | ICD-10-CM

## 2019-08-23 DIAGNOSIS — I2510 Atherosclerotic heart disease of native coronary artery without angina pectoris: Secondary | ICD-10-CM

## 2019-08-23 DIAGNOSIS — F5105 Insomnia due to other mental disorder: Secondary | ICD-10-CM

## 2019-08-23 DIAGNOSIS — J449 Chronic obstructive pulmonary disease, unspecified: Secondary | ICD-10-CM

## 2019-08-23 DIAGNOSIS — E038 Other specified hypothyroidism: Secondary | ICD-10-CM

## 2019-08-23 DIAGNOSIS — K219 Gastro-esophageal reflux disease without esophagitis: Secondary | ICD-10-CM

## 2019-08-23 DIAGNOSIS — E1169 Type 2 diabetes mellitus with other specified complication: Secondary | ICD-10-CM | POA: Diagnosis not present

## 2019-08-23 DIAGNOSIS — M7070 Other bursitis of hip, unspecified hip: Secondary | ICD-10-CM | POA: Insufficient documentation

## 2019-08-23 DIAGNOSIS — J4489 Other specified chronic obstructive pulmonary disease: Secondary | ICD-10-CM

## 2019-08-23 DIAGNOSIS — R072 Precordial pain: Secondary | ICD-10-CM

## 2019-08-23 DIAGNOSIS — I700 Atherosclerosis of aorta: Secondary | ICD-10-CM

## 2019-08-23 DIAGNOSIS — G5600 Carpal tunnel syndrome, unspecified upper limb: Secondary | ICD-10-CM | POA: Insufficient documentation

## 2019-08-23 DIAGNOSIS — I2583 Coronary atherosclerosis due to lipid rich plaque: Secondary | ICD-10-CM

## 2019-08-23 DIAGNOSIS — M810 Age-related osteoporosis without current pathological fracture: Secondary | ICD-10-CM

## 2019-08-23 LAB — POCT GLYCOSYLATED HEMOGLOBIN (HGB A1C): HbA1c, POC (controlled diabetic range): 5.5 % (ref 0.0–7.0)

## 2019-08-23 LAB — TSH: TSH: 0.39 mIU/L — ABNORMAL LOW (ref 0.40–4.50)

## 2019-08-23 MED ORDER — MELOXICAM 15 MG PO TABS: 15.0000 mg | ORAL_TABLET | Freq: Every day | ORAL | 0 refills | Status: DC

## 2019-08-23 MED ORDER — DULOXETINE HCL 60 MG PO CPEP: 60.0000 mg | ORAL_CAPSULE | Freq: Every day | ORAL | 0 refills | Status: DC

## 2019-08-23 MED ORDER — TRAZODONE HCL 100 MG PO TABS: 100.0000 mg | ORAL_TABLET | Freq: Every day | ORAL | 0 refills | Status: DC

## 2019-08-23 MED ORDER — SUMATRIPTAN SUCCINATE 50 MG PO TABS: ORAL_TABLET | ORAL | 0 refills | Status: DC

## 2019-08-23 MED ORDER — MONTELUKAST SODIUM 10 MG PO TABS: 10.0000 mg | ORAL_TABLET | Freq: Every day | ORAL | 1 refills | Status: DC

## 2019-08-23 MED ORDER — BUPROPION HCL ER (XL) 150 MG PO TB24: 150.0000 mg | ORAL_TABLET | Freq: Every day | ORAL | 1 refills | Status: DC

## 2019-08-23 MED ORDER — ONDANSETRON HCL 4 MG PO TABS: 4.0000 mg | ORAL_TABLET | Freq: Three times a day (TID) | ORAL | 0 refills | Status: DC | PRN

## 2019-08-23 MED ORDER — OMEPRAZOLE 40 MG PO CPDR: DELAYED_RELEASE_CAPSULE | ORAL | 0 refills | Status: DC

## 2019-08-23 NOTE — Progress Notes (Signed)
Name: Sara Andrews   MRN: [id number]    DOB: September 02, 1955   Date:08/23/2019       Progress Note  Subjective  Chief Complaint  Chief Complaint  Patient presents with  . Medication Refill  . Senile Purpura  . Hypothyroidism  . COPD  . DDD and spondylolisthesis  . Gastroesophageal Reflux  . Depression  . Diabetes    HPI  Recurrent headaches: she states years ago she had a history of headaches but she got much better, however over the past few weeks she has noticed a temporal headache, mostly on left side, described as sharp, usually very intense, associated with nausea, but no vomiting, no photophobia or phonophobia. She denies dizziness, tinnitus or chills. . She states tylenol does not control symptoms. She was seen by Sharyon Cable back in 05/2018 with similar episode and responded to Imitrex and zofran , she would like a refill of medication   Senile Purpura: she has noticed purple spots on arms and sometimes legs, when she bumps on anything, explained it is normal in her age. Unchanged   Neck pain: she has chronic neck pain, but recently also having pain on lateral shoulders when she goes to bed at night, difficulty getting comfortable and has to switch sides in bed, radiates to her neck and towards elbow, worse on left side. No redness or increase in warmth   DDD andspondylolisthesis:doing well since surgery done 02/2018. Doing well, off pain medication, taking prn tylenol only and since pain is under control,she used to see Dr. Noel Gerold but not seeing anyone right now   COPD:she had one flare in 2020, and one flare in 2019 , she responded to prednisone, she was on trelegy but stopped on her own because did not tolerate medication, she is now on Anoro and is doing well with medication,  using rescue inhaler less than once a week when she has SOB , she denies wheezing, but has intermittent coughing. She quit smoking in 2009.  GERD: shestopped Omeprazole but had a  recent flare and had to resume medication, doing better now, discussed trying to wean self off again.   Hypothyroidism: reviewed labs and TSH done 04/2019 was suppressed and we need to recheck, intermittent palpitation, weight loss. She is currently taking 75 mcg daily , down from 88 mcg   DM II : diagnosed at work back in 2012she used to take medications,A1C at goal.She denies polyphagia, polyuria , she has polydipsia. Started on Trulicity June 2018, but she was having worsening of constipation and indigestion, so we switched to Ozempic 10/2018weight was 198 lbs and is down to 132.4  lbs. We tried stopping Ozempic to see if nausea would resolve, but she went right back on it, she is afraid to stop medication and gain weight back. She has lost a lot of weight, cologuard is up to date, she is due for mammogram but wants to wait until Dec when she has medicare   Major Depression: She is feeling better, no longer seeing Dr. Elna Breslow but taking medication as prescribed . Phq9 is normal, patient is in remission   Recurrent nausea and weight loss: discussed referral to GI and CT abdomen but she states she is doing much better and states weight loss from Ozempic, advised to have mammogram, also needs to let me know if she would like further testing   Osteoporosis: discussed osteonecrosis of jaw, she will get it removed by dentist , we will hold off on starting medication until teeth  are extracted. Take vitamin D daily , high calcium diet and we need to correct TSH level   Chest pain: intermittently going on for the past couple of months, it happens when stressed but sometimes in the morning, associated with sob, nausea, but no vomiting or diaphoresis, CT lung showed coronary atherosclerosis. We will refer her to cardiologist   Patient Active Problem List   Diagnosis Date Noted  . Bursitis of hip 08/23/2019  . Carpal tunnel syndrome 08/23/2019  . Thoracic aorta atherosclerosis (HCC) 08/03/2019  .  Spondylosis of lumbar spine 11/06/2016  . Spinal stenosis of lumbar region with neurogenic claudication 10/28/2016  . Chronic bilateral low back pain with bilateral sciatica 09/16/2016  . Hyperglycemia 08/07/2016  . Moderate episode of recurrent major depressive disorder (HCC) 08/07/2016  . Pain of left heel 06/02/2016  . Degenerative cervical disc 03/04/2016  . Gastroesophageal reflux disease without esophagitis 02/01/2016  . Primary osteoarthritis of right hand 02/01/2016  . Paresthesias in left hand 02/01/2016  . SOB (shortness of breath) 10/05/2013  . COPD (chronic obstructive pulmonary disease) (HCC) 10/05/2013  . History of smoking 10/05/2013  . Hyperlipidemia 10/05/2013  . Adult onset hypothyroidism 10/05/2013    Past Surgical History:  Procedure Laterality Date  . ABDOMINAL HYSTERECTOMY    . CARPAL TUNNEL RELEASE Right   . LUMBAR LAMINECTOMY  03/16/2018   L4-5 Laminectomy & Fusion w/ Pedicle Screws, TLIF & Allograft; Surgeon: Virl DiamondMax William Cohen, MD; Location: [facility] MAIN OR; Service: Orthopedics; Laterality: N/A; Prone, ProAxis, Stim Neuromonitoring, O-Arm, Cell Saver, [employer], [employer], Justin, PACS on [facility]    . OOPHORECTOMY    . ORIF TIBIA PLATEAU Left 03/10/2016   Procedure: OPEN REDUCTION INTERNAL FIXATION (ORIF) BICONDYLAR TIBIAL PLATEAU FRACTURE;  Surgeon: Eldred MangesMark C Yates, MD;  Location: [facility] OR;  Service: Orthopedics;  Laterality: Left;  . SHOULDER ARTHROSCOPY W/ ROTATOR CUFF REPAIR Right   . TONSILLECTOMY AND ADENOIDECTOMY      Family History  Problem Relation Age of Onset  . Hypertension Daughter   . Breast cancer Daughter   . Diabetes Father   . Heart disease Father   . Breast cancer Maternal Aunt 60  . Breast cancer Paternal Aunt 1760  . Depression Sister   . Alcohol abuse Sister   . Anxiety disorder Sister   . Bipolar disorder Sister   . Pneumonia Sister   . Skin cancer Daughter   . Anxiety disorder Daughter     Social History   Socioeconomic History  .  Marital status: Widowed    Spouse name: Not on file  . Number of children: 3  . Years of education: Not on file  . Highest education level: 10th grade  Occupational History  . Occupation: disabled    Comment: retired  Tobacco Use  . Smoking status: Former Smoker    Packs/day: 2.00    Years: 30.00    Pack years: 60.00    Quit date: 09/03/2008    Years since quitting: 10.9  . Smokeless tobacco: Never Used  Substance and Sexual Activity  . Alcohol use: No  . Drug use: No  . Sexual activity: Never  Other Topics Concern  . Not on file  Social History Narrative   She used to work at  [employer], but had a left knee work related injury 03/10/2016 , require left knee surgery and is now having back pain, that is getting evaluated, Out of work since.       Patient recently found out that one of her  twins [name]) was diagnosed with breast cancer); will be having a double mastectomy soon.      Social Determinants of Health   Financial Resource Strain: Low Risk   . Difficulty of Paying Living Expenses: Not hard at all  Food Insecurity: No Food Insecurity  . Worried About Programme researcher, broadcasting/film/video in the Last Year: Never true  . Ran Out of Food in the Last Year: Never true  Transportation Needs: No Transportation Needs  . Lack of Transportation (Medical): No  . Lack of Transportation (Non-Medical): No  Physical Activity: Sufficiently Active  . Days of Exercise per Week: 7 days  . Minutes of Exercise per Session: 40 min  Stress: No Stress Concern Present  . Feeling of Stress : Not at all  Social Connections: Not Isolated  . Frequency of Communication with Friends and Family: More than three times a week  . Frequency of Social Gatherings with Friends and Family: More than three times a week  . Attends Religious Services: More than 4 times per year  . Active Member of Clubs or Organizations: Yes  . Attends Banker Meetings: More than 4 times per year  . Marital Status:  Married  Catering manager Violence: Not At Risk  . Fear of Current or Ex-Partner: No  . Emotionally Abused: No  . Physically Abused: No  . Sexually Abused: No     Current Outpatient Medications:  .  acyclovir (ZOVIRAX) 400 MG tablet, Take one tablet by mouth daily, Disp: 30 tablet, Rfl: 0 .  albuterol (PROVENTIL) (2.5 MG/3ML) 0.083% nebulizer solution, Take 3 mLs (2.5 mg total) by nebulization every 6 (six) hours as needed for wheezing or shortness of breath., Disp: 75 mL, Rfl: 2 .  buPROPion (WELLBUTRIN XL) 150 MG 24 hr tablet, Take 1 tablet (150 mg total) by mouth daily., Disp: 90 tablet, Rfl: 1 .  Cholecalciferol (VITAMIN D PO), Take 1 capsule by mouth daily., Disp: , Rfl:  .  diclofenac Sodium (VOLTAREN) 1 % GEL, Apply topically 4 (four) times daily., Disp: , Rfl:  .  DULoxetine (CYMBALTA) 60 MG capsule, TAKE 1 CAPSULE(60 MG) BY MOUTH DAILY, Disp: 90 capsule, Rfl: 0 .  levothyroxine (SYNTHROID) 75 MCG tablet, Take 1 tablet (75 mcg total) by mouth daily., Disp: 30 tablet, Rfl: 1 .  Melatonin 5 MG CAPS, Take 1 capsule by mouth daily., Disp: , Rfl:  .  montelukast (SINGULAIR) 10 MG tablet, TAKE 1 TABLET(10 MG) BY MOUTH AT BEDTIME, Disp: 30 tablet, Rfl: 2 .  omeprazole (PRILOSEC) 40 MG capsule, TAKE 1 CAPSULE(40 MG) BY MOUTH DAILY, Disp: 90 capsule, Rfl: 0 .  PROAIR HFA 108 (90 Base) MCG/ACT inhaler, INHALE 2 PUFFS INTO THE LUNGS EVERY 6 HOURS AS NEEDED FOR WHEEZING OR SHORTNESS OF BREATH, Disp: 8.5 g, Rfl: 0 .  rosuvastatin (CRESTOR) 20 MG tablet, TAKE 1 TABLET BY MOUTH DAILY, Disp: 90 tablet, Rfl: 1 .  traZODone (DESYREL) 100 MG tablet, TAKE 1 TABLET BY MOUTH AT BEDTIME, Disp: 90 tablet, Rfl: 0 .  baclofen (LIORESAL) 10 MG tablet, Take 1 tablet by mouth daily as needed., Disp: , Rfl:  .  fluocinonide (LIDEX) 0.05 % external solution, APP EXT AA BID, Disp: , Rfl: 0 .  Magnesium Oxide 400 MG CAPS, Take 1 capsule (400 mg total) by mouth once. (Patient not taking: Reported on 08/23/2019), Disp:  30 capsule, Rfl: 5 .  OZEMPIC, 0.25 OR 0.5 MG/DOSE, 2 MG/1.5ML SOPN, INJECT 0.5 MG UNDER THE SKIN ONCE A WEEK (  Patient not taking: Reported on 08/23/2019), Disp: 9 mL, Rfl: 0 .  SUMAtriptan (IMITREX) 25 MG tablet, May repeat in 2 hours if headache persists or recurs. Do not take more than 3 in 24 hours. (Patient not taking: Reported on 08/23/2019), Disp: 10 tablet, Rfl: 0 .  terbinafine (LAMISIL) 250 MG tablet, Take 1 tablet (250 mg total) by mouth daily. (Patient not taking: Reported on 08/23/2019), Disp: 30 tablet, Rfl: 2 .  umeclidinium-vilanterol (ANORO ELLIPTA) 62.5-25 MCG/INH AEPB, Inhale 1 puff into the lungs daily. (Patient not taking: Reported on 08/23/2019), Disp: 60 each, Rfl: 5  No Known Allergies  I personally reviewed active problem list, medication list, allergies, family history, social history with the patient/caregiver today.   ROS  Constitutional: Negative for fever or weight change.  Respiratory: Positive  for cough and shortness of breath.   Cardiovascular: Negative for chest pain or palpitations.  Gastrointestinal: Negative for abdominal pain, no bowel changes.  Musculoskeletal: Negative for gait problem or joint swelling.  Skin: Negative for rash.  Neurological: Negative for dizziness, positive for headache.  No other specific complaints in a complete review of systems (except as listed in HPI above).  Objective  Vitals:   08/23/19 1117  BP: 100/70  Pulse: 97  Resp: 16  Temp: (!) 97.1 F (36.2 C)  TempSrc: Temporal  SpO2: 98%  Weight: 138 lb (62.6 kg)  Height: 4\' 9"  (1.448 m)    Body mass index is 29.86 kg/m.  Physical Exam  Constitutional: Patient appears well-developed and well-nourished. No distress.  HEENT: head atraumatic, normocephalic, pupils equal and reactive to light Cardiovascular: Normal rate, regular rhythm and normal heart sounds.  No murmur heard. No BLE edema. Pulmonary/Chest: Effort normal and breath sounds normal. No respiratory  distress. Abdominal: Soft.  There is no tenderness. Muscular Skeletal: decrease rom of both shoulders, pain during palpation of deltoid bursa on both sides  Psychiatric: Patient has a normal mood and affect. behavior is normal. Judgment and thought content normal.  Recent Results (from the past 2160 hour(s))  Novel Coronavirus, NAA ([facility])     Status: None   Collection Time: 06/07/19 12:00 AM   Specimen: Nasopharyngeal(NP) swabs in vial transport medium   NASOPHARYNGE  TESTING  Result Value Ref Range   SARS-CoV-2, NAA Not Detected Not Detected    Comment: This nucleic acid amplification test was developed and its performance characteristics determined by [employer], [employer]. Nucleic acid amplification tests include PCR and TMA. This test has not been FDA cleared or approved. This test has been authorized by FDA under an Emergency Use Authorization (EUA). This test is only authorized for the duration of time the declaration that circumstances exist justifying the authorization of the emergency use of in vitro diagnostic tests for detection of SARS-CoV-2 virus and/or diagnosis of COVID-19 infection under section 564(b)(1) of the Act, 21 U.S.C. 350KXF-8(H) (1), unless the authorization is terminated or revoked sooner. When diagnostic testing is negative, the possibility of a false negative result should be considered in the context of a patient's recent exposures and the presence of clinical signs and symptoms consistent with COVID-19. An individual without symptoms of COVID-19 and who is not shedding SARS-CoV-2 virus would  expect to have a negative (not detected) result in this assay.   POCT HgB A1C     Status: Normal   Collection Time: 08/23/19 11:28 AM  Result Value Ref Range   Hemoglobin A1C     HbA1c POC (<> result, manual entry)  HbA1c, POC (prediabetic range)     HbA1c, POC (controlled diabetic range) 5.5 0.0 - 7.0 %     PHQ2/9: Depression screen [facility] 08/23/2019  07/12/2019 04/14/2019 04/13/2019 03/09/2019  Decreased Interest 3 0 0 0 0  Down, Depressed, Hopeless 0 0 0 0 0  PHQ - 2 Score 3 0 0 0 0  Altered sleeping 1 0 0 0 0  Tired, decreased energy 0 0 0 0 0  Change in appetite 0 0 0 0 0  Feeling bad or failure about yourself  0 0 0 0 0  Trouble concentrating 0 0 0 0 0  Moving slowly or fidgety/restless 0 0 0 0 0  Suicidal thoughts 0 0 0 0 0  PHQ-9 Score 4 0 0 0 0  Difficult doing work/chores Not difficult at all - Not difficult at all Not difficult at all Not difficult at all  Some recent data might be hidden    phq 9 is positive   Fall Risk: Fall Risk  08/23/2019 07/12/2019 04/21/2019 04/14/2019 04/14/2019  Falls in the past year? 0 0 0 0 0  Number falls in past yr: 0 0 - 0 0  Injury with Fall? 0 0 - 0 0  Comment - - - - -  Follow up - - - - Falls evaluation completed     Functional Status Survey: Is the patient deaf or have difficulty hearing?: No Does the patient have difficulty seeing, even when wearing glasses/contacts?: Yes Does the patient have difficulty concentrating, remembering, or making decisions?: No Does the patient have difficulty walking or climbing stairs?: No Does the patient have difficulty dressing or bathing?: No Does the patient have difficulty doing errands alone such as visiting a doctor's office or shopping?: No    Assessment & Plan  1. Dyslipidemia associated with type 2 diabetes mellitus (HCC)  - POCT HgB A1C  2. Thoracic aorta atherosclerosis (HCC)   3. Major depression in remission (HCC)  - DULoxetine (CYMBALTA) 60 MG capsule; Take 1 capsule (60 mg total) by mouth daily.  Dispense: 90 capsule; Refill: 0 - buPROPion (WELLBUTRIN XL) 150 MG 24 hr tablet; Take 1 tablet (150 mg total) by mouth daily.  Dispense: 90 tablet; Refill: 1  4. Dyslipidemia   5. Chronic obstructive pulmonary disease, unspecified COPD type (HCC)   6. Gastroesophageal reflux disease without esophagitis  - omeprazole  (PRILOSEC) 40 MG capsule; Daily  Dispense: 90 capsule; Refill: 0  7. Migraine without aura and without status migrainosus, not intractable  - ondansetron (ZOFRAN) 4 MG tablet; Take 1 tablet (4 mg total) by mouth every 8 (eight) hours as needed for nausea or vomiting.  Dispense: 20 tablet; Refill: 0 - SUMAtriptan (IMITREX) 50 MG tablet; May repeat in 2 hours if headache persists or recurs. Do not take more than 3 in 24 hours.  Dispense: 10 tablet; Refill: 0  8. Adult onset hypothyroidism  - TSH  9. Insomnia due to mental disorder  - traZODone (DESYREL) 100 MG tablet; Take 1 tablet (100 mg total) by mouth at bedtime.  Dispense: 90 tablet; Refill: 0  10. Asthma-COPD overlap syndrome (HCC)  - montelukast (SINGULAIR) 10 MG tablet; Take 1 tablet (10 mg total) by mouth at bedtime.  Dispense: 90 tablet; Refill: 1  11. Bursitis of deltoid, unspecified laterality  - meloxicam (MOBIC) 15 MG tablet; Take 1 tablet (15 mg total) by mouth daily.  Dispense: 30 tablet; Refill: 0  12. Age-related osteoporosis without current pathological fracture  Wait to  start therapy when teeth are pulled, 5 left

## 2019-08-23 NOTE — Telephone Encounter (Signed)
   Last refill:  08/23/2019  Notes to clinic:  Patient requesting 90 day supply   Requested Prescriptions  Pending Prescriptions Disp Refills   meloxicam (MOBIC) 15 MG tablet [Pharmacy Med Name: MELOXICAM 15MG  TABLETS] 90 tablet     Sig: TAKE 1 TABLET(15 MG) BY MOUTH DAILY      Analgesics:  COX2 Inhibitors Passed - 08/23/2019 12:13 PM      Passed - HGB in normal range and within 360 days    Hemoglobin  Date Value Ref Range Status  03/11/2019 12.9 11.1 - 15.9 g/dL Final          Passed - Cr in normal range and within 360 days    Creat  Date Value Ref Range Status  02/03/2018 0.92 0.50 - 0.99 mg/dL Final    Comment:    For patients >64 years of age, the reference limit for Creatinine is approximately 13% higher for people identified as African-American. .    Creatinine, Ser  Date Value Ref Range Status  03/11/2019 0.88 0.57 - 1.00 mg/dL Final          Passed - Patient is not pregnant      Passed - Valid encounter within last 12 months    Recent Outpatient Visits           Today Dyslipidemia associated with type 2 diabetes mellitus [facility])   [facility], MD   1 month ago Welcome to Alba Cory preventive visit   [facility], MD   4 months ago Abscess or cellulitis of scalp   [facility], FNP   4 months ago Herpes zoster with complication   [facility], FNP   5 months ago Major depression in remission [facility])   [facility], MD       Future Appointments             In 2 weeks Alba Cory, MD [facility], [facility]

## 2019-08-23 NOTE — Telephone Encounter (Signed)
Pharmacy called and they will need clarification on directions for medication SUMAtriptan (IMITREX) 50 MG tablet [[id number]]

## 2019-08-23 NOTE — Telephone Encounter (Signed)
[facility] called and needs clarification on Imitrex, please advise what the SIG should be at onset.

## 2019-08-23 NOTE — Telephone Encounter (Signed)
Called and gave [facility] instructions on patient Imitrex: Please take one at onset of migraine and may repeat times 1 two hours later, no more than 2 in 24 hours

## 2019-08-24 ENCOUNTER — Other Ambulatory Visit: Payer: Self-pay | Admitting: Family Medicine

## 2019-08-24 DIAGNOSIS — E038 Other specified hypothyroidism: Secondary | ICD-10-CM

## 2019-08-24 MED ORDER — LEVOTHYROXINE SODIUM 75 MCG PO TABS: 75.0000 ug | ORAL_TABLET | Freq: Every day | ORAL | 0 refills | Status: DC

## 2019-08-30 ENCOUNTER — Other Ambulatory Visit: Payer: Self-pay

## 2019-08-30 ENCOUNTER — Ambulatory Visit (INDEPENDENT_AMBULATORY_CARE_PROVIDER_SITE_OTHER): Payer: Medicare Other | Admitting: Cardiology

## 2019-08-30 ENCOUNTER — Encounter: Payer: Self-pay | Admitting: Cardiology

## 2019-08-30 VITALS — BP 104/80 | HR 69 | Ht <= 58 in | Wt 140.0 lb

## 2019-08-30 DIAGNOSIS — I2584 Coronary atherosclerosis due to calcified coronary lesion: Secondary | ICD-10-CM | POA: Diagnosis not present

## 2019-08-30 DIAGNOSIS — I2510 Atherosclerotic heart disease of native coronary artery without angina pectoris: Secondary | ICD-10-CM

## 2019-08-30 DIAGNOSIS — E7800 Pure hypercholesterolemia, unspecified: Secondary | ICD-10-CM | POA: Diagnosis not present

## 2019-08-30 DIAGNOSIS — R079 Chest pain, unspecified: Secondary | ICD-10-CM

## 2019-08-30 MED ORDER — METOPROLOL TARTRATE 100 MG PO TABS: 100.0000 mg | ORAL_TABLET | Freq: Once | ORAL | 0 refills | Status: DC

## 2019-08-30 MED ORDER — ASPIRIN EC 81 MG PO TBEC: 81.0000 mg | DELAYED_RELEASE_TABLET | Freq: Every day | ORAL | 3 refills | Status: DC

## 2019-08-30 NOTE — Progress Notes (Signed)
Cardiology Office Note:    Date:  08/30/2019   ID:  Sara Andrews, DOB 1955-08-05, MRN [id number]  PCP:  Alba CorySowles, Krichna, MD  Cardiologist:  Debbe OdeaBrian Agbor-Etang, MD  Electrophysiologist:  None   Referring MD: Alba CorySowles, Krichna, MD   Chief Complaint  Patient presents with  . New Patient (Initial Visit)    CAD/chest pain; Meds verbally reviewed with patient.    History of Present Illness:    Sara Andrews is a 64 y.o. female with a hx of diabetes, hyperlipidemia, COPD, former smoker x30 years who presents with chest pain.  Patient states symptoms have been going on for about 3 weeks.  She describes pain as similar to having indigestion in the central chest but without burning.  Pain occurs about 2-3 times a week and lasts about 30 minutes.  Last occurrence was yesterday.  She rates pain as about 4 out of 10 in severity.  Pain is not related with exertion.  She states having chronic shortness of breath with exertion due to long history of COPD. She had a recent CT chest for lung cancer screening which showed calcified atherosclerotic plaque in the left main, LAD and left circumflex arteries.  Past Medical History:  Diagnosis Date  . Anxiety   . Arthritis    "hips, hands, back" (03/10/2016)  . Asthma   . Chronic bronchitis (HCC)   . COPD (chronic obstructive pulmonary disease) (HCC)   . Cough   . Depression   . Esophagitis, reflux   . GERD (gastroesophageal reflux disease)   . Hyperlipidemia   . Hypothyroidism   . IBS (irritable bowel syndrome)   . Lumbago   . Muscle pain   . Osteoarthritis   . Sinus disorder   . Thyroid disease   . Type 2 diabetes, diet controlled (HCC)   . Uncomplicated herpes simplex     Past Surgical History:  Procedure Laterality Date  . ABDOMINAL HYSTERECTOMY    . CARPAL TUNNEL RELEASE Right   . LUMBAR LAMINECTOMY  03/16/2018   L4-5 Laminectomy & Fusion w/ Pedicle Screws, TLIF & Allograft; Surgeon: Virl DiamondMax William Cohen, MD; Location: [facility] MAIN  OR; Service: Orthopedics; Laterality: N/A; Prone, ProAxis, Stim Neuromonitoring, O-Arm, Cell Saver, [employer], [employer], Justin, PACS on [facility]    . OOPHORECTOMY    . ORIF TIBIA PLATEAU Left 03/10/2016   Procedure: OPEN REDUCTION INTERNAL FIXATION (ORIF) BICONDYLAR TIBIAL PLATEAU FRACTURE;  Surgeon: Eldred MangesMark C Yates, MD;  Location: [facility] OR;  Service: Orthopedics;  Laterality: Left;  . SHOULDER ARTHROSCOPY W/ ROTATOR CUFF REPAIR Right   . TONSILLECTOMY AND ADENOIDECTOMY      Current Medications: Current Meds  Medication Sig  . albuterol (PROVENTIL) (2.5 MG/3ML) 0.083% nebulizer solution Take 3 mLs (2.5 mg total) by nebulization every 6 (six) hours as needed for wheezing or shortness of breath.  . baclofen (LIORESAL) 10 MG tablet Take 1 tablet by mouth daily as needed.  Marland Kitchen. buPROPion (WELLBUTRIN XL) 150 MG 24 hr tablet Take 1 tablet (150 mg total) by mouth daily.  . Cholecalciferol (VITAMIN D PO) Take 1 capsule by mouth daily.  . diclofenac Sodium (VOLTAREN) 1 % GEL Apply topically 4 (four) times daily.  . DULoxetine (CYMBALTA) 60 MG capsule Take 1 capsule (60 mg total) by mouth daily.  . fluocinonide (LIDEX) 0.05 % external solution APP EXT AA BID  . levothyroxine (SYNTHROID) 75 MCG tablet Take 1 tablet (75 mcg total) by mouth daily. And half on sundays  . Melatonin 5 MG CAPS  Take 1 capsule by mouth daily.  . meloxicam (MOBIC) 15 MG tablet TAKE 1 TABLET(15 MG) BY MOUTH DAILY  . montelukast (SINGULAIR) 10 MG tablet Take 1 tablet (10 mg total) by mouth at bedtime.  Marland Kitchen omeprazole (PRILOSEC) 40 MG capsule Daily  . ondansetron (ZOFRAN) 4 MG tablet Take 1 tablet (4 mg total) by mouth every 8 (eight) hours as needed for nausea or vomiting.  Marland Kitchen PROAIR HFA 108 (90 Base) MCG/ACT inhaler INHALE 2 PUFFS INTO THE LUNGS EVERY 6 HOURS AS NEEDED FOR WHEEZING OR SHORTNESS OF BREATH  . rosuvastatin (CRESTOR) 20 MG tablet TAKE 1 TABLET BY MOUTH DAILY  . SUMAtriptan (IMITREX) 50 MG tablet May repeat in 2 hours if headache  persists or recurs. Do not take more than 3 in 24 hours.  . traZODone (DESYREL) 100 MG tablet Take 1 tablet (100 mg total) by mouth at bedtime.  Marland Kitchen umeclidinium-vilanterol (ANORO ELLIPTA) 62.5-25 MCG/INH AEPB Inhale 1 puff into the lungs daily.     Allergies:   Patient has no known allergies.   Social History   Socioeconomic History  . Marital status: Widowed    Spouse name: Not on file  . Number of children: 3  . Years of education: Not on file  . Highest education level: 10th grade  Occupational History  . Occupation: disabled    Comment: retired  Tobacco Use  . Smoking status: Former Smoker    Packs/day: 2.00    Years: 30.00    Pack years: 60.00    Quit date: 09/03/2008    Years since quitting: 10.9  . Smokeless tobacco: Never Used  Substance and Sexual Activity  . Alcohol use: No  . Drug use: No  . Sexual activity: Never  Other Topics Concern  . Not on file  Social History Narrative   She used to work at  [employer], but had a left knee work related injury 03/10/2016 , require left knee surgery and is now having back pain, that is getting evaluated, Out of work since.       Patient recently found out that one of her twins [name]) was diagnosed with breast cancer); will be having a double mastectomy soon.      Social Determinants of Health   Financial Resource Strain: Low Risk   . Difficulty of Paying Living Expenses: Not hard at all  Food Insecurity: No Food Insecurity  . Worried About Programme researcher, broadcasting/film/video in the Last Year: Never true  . Ran Out of Food in the Last Year: Never true  Transportation Needs: No Transportation Needs  . Lack of Transportation (Medical): No  . Lack of Transportation (Non-Medical): No  Physical Activity: Sufficiently Active  . Days of Exercise per Week: 7 days  . Minutes of Exercise per Session: 40 min  Stress: No Stress Concern Present  . Feeling of Stress : Not at all  Social Connections: Not Isolated  . Frequency of Communication  with Friends and Family: More than three times a week  . Frequency of Social Gatherings with Friends and Family: More than three times a week  . Attends Religious Services: More than 4 times per year  . Active Member of Clubs or Organizations: Yes  . Attends Banker Meetings: More than 4 times per year  . Marital Status: Married     Family History: The patient's family history includes Alcohol abuse in her sister; Anxiety disorder in her daughter and sister; Bipolar disorder in her sister; Breast cancer in  her daughter; Breast cancer (age of onset: 75) in her maternal aunt and paternal aunt; Depression in her sister; Diabetes in her father; Heart disease in her father; Hypertension in her daughter; Pneumonia in her sister; Skin cancer in her daughter.  ROS:   Please see the history of present illness.     All other systems reviewed and are negative.  EKGs/Labs/Other Studies Reviewed:    The following studies were reviewed today:   EKG:  EKG is  ordered today.  The ekg ordered today demonstrates normal sinus rhythm, cannot rule out old anterior infarct  Recent Labs: 03/11/2019: BUN 10; Creatinine, Ser 0.88; Hemoglobin 12.9; Platelets 225; Potassium 4.7; Sodium 142 05/11/2019: ALT 24 08/23/2019: TSH 0.39  Recent Lipid Panel    Component Value Date/Time   CHOL 105 03/11/2019 0918   TRIG 49 03/11/2019 0918   HDL 60 03/11/2019 0918   CHOLHDL 1.8 03/11/2019 0918   CHOLHDL 2.0 02/03/2018 1146   LDLCALC 35 03/11/2019 0918   LDLCALC 39 02/03/2018 1146    Physical Exam:    VS:  BP 104/80 (BP Location: Right Arm, Patient Position: Sitting, Cuff Size: Normal)   Pulse 69   Ht 4\' 8"  (1.422 m)   Wt 140 lb (63.5 kg)   SpO2 97%   BMI 31.39 kg/m     Wt Readings from Last 3 Encounters:  08/30/19 140 lb (63.5 kg)  08/23/19 138 lb (62.6 kg)  08/03/19 132 lb (59.9 kg)     GEN:  Well nourished, well developed in no acute distress HEENT: Normal NECK: No JVD; No carotid  bruits LYMPHATICS: No lymphadenopathy CARDIAC: RRR, no murmurs, rubs, gallops RESPIRATORY: Decreased breath sounds, rhonchi at bases. ABDOMEN: Soft, non-tender, non-distended MUSCULOSKELETAL:  No edema; No deformity  SKIN: Warm and dry NEUROLOGIC:  Alert and oriented x 3 PSYCHIATRIC:  Normal affect   ASSESSMENT:    1. Chest pain of uncertain etiology   2. Coronary artery calcification   3. Pure hypercholesterolemia   4. Chest pain, unspecified type    PLAN:    In order of problems listed above:  1. Patient presents with atypical chest pain.  She has risk factors of diabetes, hyperlipidemia, prior smoker.  Recent CT scan shows coronary calcifications.  Will obtain coronary CTA to evaluate severity of CAD.  Also get echocardiogram. 2. Coronary artery calcifications noted on screening lung CT scan.  Start aspirin 81 mg, continue Crestor 20 mg. 3. Continue Crestor as currently prescribed for hyperlipidemia.  Follow-up after echo and coronary CT scan.  This note was generated in part or whole with voice recognition software. Voice recognition is usually quite accurate but there are transcription errors that can and very often do occur. I apologize for any typographical errors that were not detected and corrected.  Medication Adjustments/Labs and Tests Ordered: Current medicines are reviewed at length with the patient today.  Concerns regarding medicines are outlined above.  Orders Placed This Encounter  Procedures  . CT CORONARY MORPH W/CTA COR W/SCORE W/CA W/CM &/OR WO/CM  . CT CORONARY FRACTIONAL FLOW RESERVE DATA PREP  . CT CORONARY FRACTIONAL FLOW RESERVE FLUID ANALYSIS  . Basic metabolic panel  . EKG 12-Lead  . ECHOCARDIOGRAM COMPLETE   Meds ordered this encounter  Medications  . aspirin EC 81 MG tablet    Sig: Take 1 tablet (81 mg total) by mouth daily.    Dispense:  90 tablet    Refill:  3  . metoprolol tartrate (LOPRESSOR) 100 MG tablet  Sig: Take 1 tablet (100 mg  total) by mouth once for 1 dose. Take 2 hours prior to the CT.    Dispense:  1 tablet    Refill:  0    Patient Instructions  Medication Instructions:  Your physician has recommended you make the following change in your medication:  1- START Aspirin 81 mg by mouth once a day (over-the-counter) 2- Prior to the CT - TAKE Lopressor 100 mg by mouth once 2 hours prior to the CT.    *If you need a refill on your cardiac medications before your next appointment, please call your pharmacy*  Lab Work: Your physician recommends that you return for lab work in: TODAY - BMET.  If you have labs (blood work) drawn today and your tests are completely normal, you will receive your results only by: Marland Kitchen. MyChart Message (if you have MyChart) OR . A paper copy in the mail If you have any lab test that is abnormal or we need to change your treatment, we will call you to review the results.  Testing/Procedures: 1- ECHOCARDIOGRAM - Your physician has requested that you have an echocardiogram. Echocardiography is a painless test that uses sound waves to create images of your heart. It provides your doctor with information about the size and shape of your heart and how well your heart's chambers and valves are working. This procedure takes approximately one hour. There are no restrictions for this procedure. You may get an IV, if needed, to receive an ultrasound enhancing agent through to better visualize your heart.    2- CARDIAC CT - Your physician has requested that you have cardiac CT. Cardiac computed tomography (CT) is a painless test that uses an x-ray machine to take clear, detailed pictures of your heart. For further information please visit [email or web address]. Please follow instruction sheet as given.  Your cardiac CT will be scheduled at one of the below locations:   [facility] [address] [place], [place] [address] [phone]  OR  [facility] [address] [place], [place] [address] [phone]  If scheduled at [facility], please arrive at the [facility] main entrance of [facility] 30-45 minutes prior to test start time. Proceed to the [facility] Radiology Department (first floor) to check-in and test prep.  If scheduled at [facility], please arrive 15 mins early for check-in and test prep.  Please follow these instructions carefully (unless otherwise directed):  On the Night Before the Test: . Be sure to Drink plenty of water. . Do not consume any caffeinated/decaffeinated beverages or chocolate 12 hours prior to your test. . Do not take any antihistamines 12 hours prior to your test.  On the Day of the Test: . Drink plenty of water. Do not drink any water within one hour of the test. . Do not eat any food 4 hours prior to the test. . You may take your regular medications prior to the test.  . Take metoprolol (Lopressor) two hours prior to test. . HOLD Furosemide/Hydrochlorothiazide morning of the test. . FEMALES- please wear underwire-free bra if available      After the Test: . Drink plenty of water. . After receiving IV contrast, you may experience a mild flushed feeling. This is normal. . On occasion, you may experience a mild rash up to 24 hours after the test. This is not dangerous. If this occurs, you can take Benadryl 25 mg  and increase your fluid intake. . If you experience trouble breathing, this can be serious. If it is severe call 911 IMMEDIATELY. If it is mild, please call our office. . If you take any of these medications: Glipizide/Metformin, Avandament, Glucavance, please do not take 48 hours after completing test unless otherwise instructed.   Once we have confirmed authorization from your insurance company, we will call you to set up a date and time for your test.   For non-scheduling related questions, please contact the  cardiac imaging nurse navigator should you have any questions/concerns: Marchia Bond, RN Navigator Cardiac Imaging [facility] [phone] Office    Follow-Up: At [facility], you and your health needs are our priority.  As part of our continuing mission to provide you with exceptional heart care, we have created designated Provider Care Teams.  These Care Teams include your primary Cardiologist (physician) and Advanced Practice Providers (APPs -  Physician Assistants and Nurse Practitioners) who all work together to provide you with the care you need, when you need it.  Your next appointment:   After testing is complete  The format for your next appointment:   In Person  Provider:   Kate Sable, MD    Echocardiogram An echocardiogram is a procedure that uses painless sound waves (ultrasound) to produce an image of the heart. Images from an echocardiogram can provide important information about:  Signs of coronary artery disease (CAD).  Aneurysm detection. An aneurysm is a weak or damaged part of an artery wall that bulges out from the normal force of blood pumping through the body.  Heart size and shape. Changes in the size or shape of the heart can be associated with certain conditions, including heart failure, aneurysm, and CAD.  Heart muscle function.  Heart valve function.  Signs of a past heart attack.  Fluid buildup around the heart.  Thickening of the heart muscle.  A tumor or infectious growth around the heart valves. Tell a health care provider about:  Any allergies you have.  All medicines you are taking, including vitamins, herbs, eye drops, creams, and over-the-counter medicines.  Any blood disorders you have.  Any surgeries you have had.  Any medical conditions you have.  Whether you are pregnant or may be pregnant. What are the risks? Generally, this is a safe procedure. However, problems may occur,  including:  Allergic reaction to dye (contrast) that may be used during the procedure. What happens before the procedure? No specific preparation is needed. You may eat and drink normally. What happens during the procedure?   An IV tube may be inserted into one of your veins.  You may receive contrast through this tube. A contrast is an injection that improves the quality of the pictures from your heart.  A gel will be applied to your chest.  A wand-like tool (transducer) will be moved over your chest. The gel will help to transmit the sound waves from the transducer.  The sound waves will harmlessly bounce off of your heart to allow the heart images to be captured in real-time motion. The images will be recorded on a computer. The procedure may vary among health care providers and hospitals. What happens after the procedure?  You may return to your normal, everyday life, including diet, activities, and medicines, unless your health care provider tells you not to do that. Summary  An echocardiogram is a procedure that uses painless sound waves (ultrasound) to produce an image of  the heart.  Images from an echocardiogram can provide important information about the size and shape of your heart, heart muscle function, heart valve function, and fluid buildup around your heart.  You do not need to do anything to prepare before this procedure. You may eat and drink normally.  After the echocardiogram is completed, you may return to your normal, everyday life, unless your health care provider tells you not to do that. This information is not intended to replace advice given to you by your health care provider. Make sure you discuss any questions you have with your health care provider. Document Revised: 11/04/2018 Document Reviewed: 08/16/2016 Elsevier Patient Education  2020 Elsevier Inc.    Cardiac Nuclear Scan A cardiac nuclear scan is a test that measures blood flow to the heart  when a person is resting and when he or she is exercising. The test looks for problems such as:  Not enough blood reaching a portion of the heart.  The heart muscle not working normally. You may need this test if:  You have heart disease.  You have had abnormal lab results.  You have had heart surgery or a balloon procedure to open up blocked arteries (angioplasty).  You have chest pain.  You have shortness of breath. In this test, a radioactive dye (tracer) is injected into your bloodstream. After the tracer has traveled to your heart, an imaging device is used to measure how much of the tracer is absorbed by or distributed to various areas of your heart. This procedure is usually done at a hospital and takes 2-4 hours. Tell a health care provider about:  Any allergies you have.  All medicines you are taking, including vitamins, herbs, eye drops, creams, and over-the-counter medicines.  Any problems you or family members have had with anesthetic medicines.  Any blood disorders you have.  Any surgeries you have had.  Any medical conditions you have.  Whether you are pregnant or may be pregnant. What are the risks? Generally, this is a safe procedure. However, problems may occur, including:  Serious chest pain and heart attack. This is only a risk if the stress portion of the test is done.  Rapid heartbeat.  Sensation of warmth in your chest. This usually passes quickly.  Allergic reaction to the tracer. What happens before the procedure?  Ask your health care provider about changing or stopping your regular medicines. This is especially important if you are taking diabetes medicines or blood thinners.  Follow instructions from your health care provider about eating or drinking restrictions.  Remove your jewelry on the day of the procedure. What happens during the procedure?  An IV will be inserted into one of your veins.  Your health care provider will inject a  small amount of radioactive tracer through the IV.  You will wait for 20-40 minutes while the tracer travels through your bloodstream.  Your heart activity will be monitored with an electrocardiogram (ECG).  You will lie down on an exam table.  Images of your heart will be taken for about 15-20 minutes.  You may also have a stress test. For this test, one of the following may be done: ? You will exercise on a treadmill or stationary bike. While you exercise, your heart's activity will be monitored with an ECG, and your blood pressure will be checked. ? You will be given medicines that will increase blood flow to parts of your heart. This is done if you are unable to exercise.  When blood flow to your heart has peaked, a tracer will again be injected through the IV.  After 20-40 minutes, you will get back on the exam table and have more images taken of your heart.  Depending on the type of tracer used, scans may need to be repeated 3-4 hours later.  Your IV line will be removed when the procedure is over. The procedure may vary among health care providers and hospitals. What happens after the procedure?  Unless your health care provider tells you otherwise, you may return to your normal schedule, including diet, activities, and medicines.  Unless your health care provider tells you otherwise, you may increase your fluid intake. This will help to flush the contrast dye from your body. Drink enough fluid to keep your urine pale yellow.  Ask your health care provider, or the department that is doing the test: ? When will my results be ready? ? How will I get my results? Summary  A cardiac nuclear scan measures the blood flow to the heart when a person is resting and when he or she is exercising.  Tell your health care provider if you are pregnant.  Before the procedure, ask your health care provider about changing or stopping your regular medicines. This is especially important if  you are taking diabetes medicines or blood thinners.  After the procedure, unless your health care provider tells you otherwise, increase your fluid intake. This will help flush the contrast dye from your body.  After the procedure, unless your health care provider tells you otherwise, you may return to your normal schedule, including diet, activities, and medicines. This information is not intended to replace advice given to you by your health care provider. Make sure you discuss any questions you have with your health care provider. Document Revised: 12/28/2017 Document Reviewed: 12/28/2017 Elsevier Patient Education  2020 [employer].     Signed, Debbe Odea, MD  08/30/2019 11:01 AM    [facility]

## 2019-08-30 NOTE — Patient Instructions (Addendum)
Medication Instructions:  Your physician has recommended you make the following change in your medication:  1- START Aspirin 81 mg by mouth once a day (over-the-counter) 2- Prior to the CT - TAKE Lopressor 100 mg by mouth once 2 hours prior to the CT.    *If you need a refill on your cardiac medications before your next appointment, please call your pharmacy*  Lab Work: Your physician recommends that you return for lab work in: TODAY - BMET.  If you have labs (blood work) drawn today and your tests are completely normal, you will receive your results only by: Marland Kitchen MyChart Message (if you have MyChart) OR . A paper copy in the mail If you have any lab test that is abnormal or we need to change your treatment, we will call you to review the results.  Testing/Procedures: 1- ECHOCARDIOGRAM - Your physician has requested that you have an echocardiogram. Echocardiography is a painless test that uses sound waves to create images of your heart. It provides your doctor with information about the size and shape of your heart and how well your heart's chambers and valves are working. This procedure takes approximately one hour. There are no restrictions for this procedure. You may get an IV, if needed, to receive an ultrasound enhancing agent through to better visualize your heart.    2- CARDIAC CT - Your physician has requested that you have cardiac CT. Cardiac computed tomography (CT) is a painless test that uses an x-ray machine to take clear, detailed pictures of your heart. For further information please visit [email or web address] Please follow instruction sheet as given.  Your cardiac CT will be scheduled at one of the below locations:   [facility] [address] [place], [place] [address] [phone]  OR  [facility] [address] [place], [place] [address] [phone]  If scheduled at [facility], please arrive at  the [facility] main entrance of [facility] 30-45 minutes prior to test start time. Proceed to the [facility] Radiology Department (first floor) to check-in and test prep.  If scheduled at [facility], please arrive 15 mins early for check-in and test prep.  Please follow these instructions carefully (unless otherwise directed):  On the Night Before the Test: . Be sure to Drink plenty of water. . Do not consume any caffeinated/decaffeinated beverages or chocolate 12 hours prior to your test. . Do not take any antihistamines 12 hours prior to your test.  On the Day of the Test: . Drink plenty of water. Do not drink any water within one hour of the test. . Do not eat any food 4 hours prior to the test. . You may take your regular medications prior to the test.  . Take metoprolol (Lopressor) two hours prior to test. . HOLD Furosemide/Hydrochlorothiazide morning of the test. . FEMALES- please wear underwire-free bra if available      After the Test: . Drink plenty of water. . After receiving IV contrast, you may experience a mild flushed feeling. This is normal. . On occasion, you may experience a mild rash up to 24 hours after the test. This is not dangerous. If this occurs, you can take Benadryl 25 mg and increase your fluid intake. . If you experience trouble breathing, this can be serious. If it is severe call 911 IMMEDIATELY. If it is mild, please call our office. . If you take any of these medications: Glipizide/Metformin, Avandament,  Glucavance, please do not take 48 hours after completing test unless otherwise instructed.   Once we have confirmed authorization from your insurance company, we will call you to set up a date and time for your test.   For non-scheduling related questions, please contact the cardiac imaging nurse navigator should you have any questions/concerns: Marchia Bond, RN Navigator Cardiac Imaging [facility] [phone] Office    Follow-Up: At [facility], you and your health needs are our priority.  As part of our continuing mission to provide you with exceptional heart care, we have created designated Provider Care Teams.  These Care Teams include your primary Cardiologist (physician) and Advanced Practice Providers (APPs -  Physician Assistants and Nurse Practitioners) who all work together to provide you with the care you need, when you need it.  Your next appointment:   After testing is complete  The format for your next appointment:   In Person  Provider:   Kate Sable, MD    Echocardiogram An echocardiogram is a procedure that uses painless sound waves (ultrasound) to produce an image of the heart. Images from an echocardiogram can provide important information about:  Signs of coronary artery disease (CAD).  Aneurysm detection. An aneurysm is a weak or damaged part of an artery wall that bulges out from the normal force of blood pumping through the body.  Heart size and shape. Changes in the size or shape of the heart can be associated with certain conditions, including heart failure, aneurysm, and CAD.  Heart muscle function.  Heart valve function.  Signs of a past heart attack.  Fluid buildup around the heart.  Thickening of the heart muscle.  A tumor or infectious growth around the heart valves. Tell a health care provider about:  Any allergies you have.  All medicines you are taking, including vitamins, herbs, eye drops, creams, and over-the-counter medicines.  Any blood disorders you have.  Any surgeries you have had.  Any medical conditions you have.  Whether you are pregnant or may be pregnant. What are the risks? Generally, this is a safe procedure. However, problems may occur, including:  Allergic reaction to dye (contrast) that may be used during the procedure. What happens before the procedure? No specific preparation is  needed. You may eat and drink normally. What happens during the procedure?   An IV tube may be inserted into one of your veins.  You may receive contrast through this tube. A contrast is an injection that improves the quality of the pictures from your heart.  A gel will be applied to your chest.  A wand-like tool (transducer) will be moved over your chest. The gel will help to transmit the sound waves from the transducer.  The sound waves will harmlessly bounce off of your heart to allow the heart images to be captured in real-time motion. The images will be recorded on a computer. The procedure may vary among health care providers and hospitals. What happens after the procedure?  You may return to your normal, everyday life, including diet, activities, and medicines, unless your health care provider tells you not to do that. Summary  An echocardiogram is a procedure that uses painless sound waves (ultrasound) to produce an image of the heart.  Images from an echocardiogram can provide important information about the size and shape of your heart, heart muscle function, heart valve function, and fluid buildup around your heart.  You do not need to do anything to  prepare before this procedure. You may eat and drink normally.  After the echocardiogram is completed, you may return to your normal, everyday life, unless your health care provider tells you not to do that. This information is not intended to replace advice given to you by your health care provider. Make sure you discuss any questions you have with your health care provider. Document Revised: 11/04/2018 Document Reviewed: 08/16/2016 Elsevier Patient Education  2020 Elsevier Inc.    Cardiac Nuclear Scan A cardiac nuclear scan is a test that measures blood flow to the heart when a person is resting and when he or she is exercising. The test looks for problems such as:  Not enough blood reaching a portion of the heart.  The  heart muscle not working normally. You may need this test if:  You have heart disease.  You have had abnormal lab results.  You have had heart surgery or a balloon procedure to open up blocked arteries (angioplasty).  You have chest pain.  You have shortness of breath. In this test, a radioactive dye (tracer) is injected into your bloodstream. After the tracer has traveled to your heart, an imaging device is used to measure how much of the tracer is absorbed by or distributed to various areas of your heart. This procedure is usually done at a hospital and takes 2-4 hours. Tell a health care provider about:  Any allergies you have.  All medicines you are taking, including vitamins, herbs, eye drops, creams, and over-the-counter medicines.  Any problems you or family members have had with anesthetic medicines.  Any blood disorders you have.  Any surgeries you have had.  Any medical conditions you have.  Whether you are pregnant or may be pregnant. What are the risks? Generally, this is a safe procedure. However, problems may occur, including:  Serious chest pain and heart attack. This is only a risk if the stress portion of the test is done.  Rapid heartbeat.  Sensation of warmth in your chest. This usually passes quickly.  Allergic reaction to the tracer. What happens before the procedure?  Ask your health care provider about changing or stopping your regular medicines. This is especially important if you are taking diabetes medicines or blood thinners.  Follow instructions from your health care provider about eating or drinking restrictions.  Remove your jewelry on the day of the procedure. What happens during the procedure?  An IV will be inserted into one of your veins.  Your health care provider will inject a small amount of radioactive tracer through the IV.  You will wait for 20-40 minutes while the tracer travels through your bloodstream.  Your heart  activity will be monitored with an electrocardiogram (ECG).  You will lie down on an exam table.  Images of your heart will be taken for about 15-20 minutes.  You may also have a stress test. For this test, one of the following may be done: ? You will exercise on a treadmill or stationary bike. While you exercise, your heart's activity will be monitored with an ECG, and your blood pressure will be checked. ? You will be given medicines that will increase blood flow to parts of your heart. This is done if you are unable to exercise.  When blood flow to your heart has peaked, a tracer will again be injected through the IV.  After 20-40 minutes, you will get back on the exam table and have more images taken of your heart.  Depending  on the type of tracer used, scans may need to be repeated 3-4 hours later.  Your IV line will be removed when the procedure is over. The procedure may vary among health care providers and hospitals. What happens after the procedure?  Unless your health care provider tells you otherwise, you may return to your normal schedule, including diet, activities, and medicines.  Unless your health care provider tells you otherwise, you may increase your fluid intake. This will help to flush the contrast dye from your body. Drink enough fluid to keep your urine pale yellow.  Ask your health care provider, or the department that is doing the test: ? When will my results be ready? ? How will I get my results? Summary  A cardiac nuclear scan measures the blood flow to the heart when a person is resting and when he or she is exercising.  Tell your health care provider if you are pregnant.  Before the procedure, ask your health care provider about changing or stopping your regular medicines. This is especially important if you are taking diabetes medicines or blood thinners.  After the procedure, unless your health care provider tells you otherwise, increase your fluid  intake. This will help flush the contrast dye from your body.  After the procedure, unless your health care provider tells you otherwise, you may return to your normal schedule, including diet, activities, and medicines. This information is not intended to replace advice given to you by your health care provider. Make sure you discuss any questions you have with your health care provider. Document Revised: 12/28/2017 Document Reviewed: 12/28/2017 Elsevier Patient Education  2020 [employer].

## 2019-08-31 LAB — BASIC METABOLIC PANEL
BUN/Creatinine Ratio: 12 (ref 12–28)
BUN: 10 mg/dL (ref 8–27)
CO2: 25 mmol/L (ref 20–29)
Calcium: 9.2 mg/dL (ref 8.7–10.3)
Chloride: 105 mmol/L (ref 96–106)
Creatinine, Ser: 0.84 mg/dL (ref 0.57–1.00)
GFR calc Af Amer: 84 mL/min/{1.73_m2} (ref >59)
GFR calc non Af Amer: 73 mL/min/{1.73_m2} (ref >59)
Glucose: 89 mg/dL (ref 65–99)
Potassium: 4.6 mmol/L (ref 3.5–5.2)
Sodium: 143 mmol/L (ref 134–144)

## 2019-09-03 ENCOUNTER — Other Ambulatory Visit: Payer: Self-pay | Admitting: Family Medicine

## 2019-09-03 DIAGNOSIS — E038 Other specified hypothyroidism: Secondary | ICD-10-CM

## 2019-09-03 NOTE — Telephone Encounter (Signed)
Requested Prescriptions  Pending Prescriptions Disp Refills  . levothyroxine (SYNTHROID) 75 MCG tablet [Pharmacy Med Name: LEVOTHYROXINE 0.075MG  ( ) TABS] 30 tablet 0    Sig: TAKE 1 TABLET(75 MCG) BY MOUTH DAILY     Endocrinology:  Hypothyroid Agents Failed - 09/03/2019 10:22 AM      Failed - TSH needs to be rechecked within 3 months after an abnormal result. Refill until TSH is due.      Failed - TSH in normal range and within 360 days    TSH  Date Value Ref Range Status  08/23/2019 0.39 (L) 0.40 - 4.50 mIU/L Final         Passed - Valid encounter within last 12 months    Recent Outpatient Visits          1 week ago Dyslipidemia associated with type 2 diabetes mellitus [facility])   [facility] Alba Cory, MD   1 month ago [facility] to [employer] preventive visit   [facility] Alba Cory, MD   4 months ago Abscess or cellulitis of scalp   [facility] Doren Custard, FNP   4 months ago Herpes zoster with complication   [facility] Doren Custard, FNP   5 months ago Major depression in remission [facility])   [facility] Alba Cory, MD      Future Appointments            In 3 weeks Agbor-Etang, Arlys John, MD [facility], [facility]   In 4 months Alba Cory, MD [facility], [facility]

## 2019-09-08 ENCOUNTER — Other Ambulatory Visit: Payer: Self-pay | Admitting: Family Medicine

## 2019-09-08 DIAGNOSIS — J449 Chronic obstructive pulmonary disease, unspecified: Secondary | ICD-10-CM

## 2019-09-09 ENCOUNTER — Ambulatory Visit: Payer: BLUE CROSS/BLUE SHIELD | Admitting: Family Medicine

## 2019-09-21 ENCOUNTER — Ambulatory Visit (INDEPENDENT_AMBULATORY_CARE_PROVIDER_SITE_OTHER): Payer: Medicare Other

## 2019-09-21 ENCOUNTER — Other Ambulatory Visit: Payer: Self-pay

## 2019-09-21 DIAGNOSIS — I2510 Atherosclerotic heart disease of native coronary artery without angina pectoris: Secondary | ICD-10-CM | POA: Diagnosis not present

## 2019-09-21 DIAGNOSIS — I2584 Coronary atherosclerosis due to calcified coronary lesion: Secondary | ICD-10-CM | POA: Diagnosis not present

## 2019-09-21 DIAGNOSIS — R079 Chest pain, unspecified: Secondary | ICD-10-CM | POA: Diagnosis not present

## 2019-09-26 ENCOUNTER — Ambulatory Visit: Payer: Medicare Other | Admitting: Cardiology

## 2019-09-28 ENCOUNTER — Encounter (HOSPITAL_COMMUNITY): Payer: Self-pay

## 2019-09-28 ENCOUNTER — Telehealth (HOSPITAL_COMMUNITY): Payer: Self-pay | Admitting: Emergency Medicine

## 2019-09-28 NOTE — Telephone Encounter (Signed)
Reaching out to patient to offer assistance regarding upcoming cardiac imaging study; pt verbalizes understanding of appt date/time, parking situation and where to check in, pre-test NPO status and medications ordered, and verified current allergies; name and call back number provided for further questions should they arise Tison Leibold RN Navigator Cardiac Imaging [facility] [phone] office [phone] cell 

## 2019-09-29 ENCOUNTER — Ambulatory Visit
Admission: RE | Admit: 2019-09-29 | Discharge: 2019-09-29 | Disposition: A | Discharge status: Home / Self Care | Payer: Medicare Other | Source: Ambulatory Visit | Attending: Cardiology | Admitting: Cardiology

## 2019-09-29 ENCOUNTER — Other Ambulatory Visit: Payer: Self-pay

## 2019-09-29 DIAGNOSIS — R079 Chest pain, unspecified: Secondary | ICD-10-CM | POA: Diagnosis not present

## 2019-09-29 DIAGNOSIS — R911 Solitary pulmonary nodule: Secondary | ICD-10-CM | POA: Insufficient documentation

## 2019-09-29 DIAGNOSIS — J439 Emphysema, unspecified: Secondary | ICD-10-CM | POA: Insufficient documentation

## 2019-09-29 DIAGNOSIS — I2584 Coronary atherosclerosis due to calcified coronary lesion: Secondary | ICD-10-CM | POA: Diagnosis present

## 2019-09-29 DIAGNOSIS — I2510 Atherosclerotic heart disease of native coronary artery without angina pectoris: Secondary | ICD-10-CM | POA: Insufficient documentation

## 2019-09-29 DIAGNOSIS — I700 Atherosclerosis of aorta: Secondary | ICD-10-CM

## 2019-09-29 MED ORDER — SODIUM CHLORIDE 0.9 % IV BOLUS
500.0000 mL | Freq: Once | INTRAVENOUS | Status: AC
  Administered 2019-09-29: 500 mL via INTRAVENOUS

## 2019-09-29 MED ORDER — IOHEXOL 350 MG/ML SOLN
100.0000 mL | Freq: Once | INTRAVENOUS | Status: AC | PRN
  Administered 2019-09-29: 100 mL via INTRAVENOUS

## 2019-09-29 MED ORDER — NITROGLYCERIN 0.4 MG SL SUBL
0.4000 mg | SUBLINGUAL_TABLET | Freq: Once | SUBLINGUAL | Status: AC
  Administered 2019-09-29: 0.4 mg via SUBLINGUAL

## 2019-09-29 NOTE — Progress Notes (Signed)
   09/29/19 1057 09/29/19 1100  Vital Signs  ECG Heart Rate (!) 54 (!) 52  Resp 16  --   BP (!) 86/57 (!) 95/53  BP Location Left Arm  --   BP Method Automatic  --   Patient Position (if appropriate) Sitting  --   Oxygen Therapy  SpO2 98 %  --   O2 Device Room Air  --    Patient arrived for CT heart exam. Blood pressure on arrival 86/57. Dr. Azucena Cecil made aware of initial blood pressure results. Order given to proceed with scan, give patient 0.4mg  of nitroglycerin, and to give IV fluid bolus after scan if blood pressure low.

## 2019-09-29 NOTE — Progress Notes (Signed)
   09/29/19 1232  Vital Signs  ECG Heart Rate (!) 56  Resp 18  BP 109/60  Oxygen Therapy  SpO2 98 %  O2 Device Room Air   NS bolus complete. Patient feels fine and ready to go home.

## 2019-09-30 DIAGNOSIS — I700 Atherosclerosis of aorta: Secondary | ICD-10-CM | POA: Diagnosis not present

## 2019-09-30 DIAGNOSIS — R079 Chest pain, unspecified: Secondary | ICD-10-CM | POA: Diagnosis not present

## 2019-09-30 DIAGNOSIS — I2510 Atherosclerotic heart disease of native coronary artery without angina pectoris: Secondary | ICD-10-CM

## 2019-10-03 ENCOUNTER — Other Ambulatory Visit: Payer: Self-pay

## 2019-10-03 ENCOUNTER — Ambulatory Visit (INDEPENDENT_AMBULATORY_CARE_PROVIDER_SITE_OTHER): Payer: Medicare Other | Admitting: Cardiology

## 2019-10-03 ENCOUNTER — Encounter: Payer: Self-pay | Admitting: Cardiology

## 2019-10-03 VITALS — BP 100/68 | HR 65 | Ht <= 58 in | Wt 142.5 lb

## 2019-10-03 DIAGNOSIS — E7800 Pure hypercholesterolemia, unspecified: Secondary | ICD-10-CM

## 2019-10-03 DIAGNOSIS — I2584 Coronary atherosclerosis due to calcified coronary lesion: Secondary | ICD-10-CM

## 2019-10-03 DIAGNOSIS — R079 Chest pain, unspecified: Secondary | ICD-10-CM

## 2019-10-03 DIAGNOSIS — I2510 Atherosclerotic heart disease of native coronary artery without angina pectoris: Secondary | ICD-10-CM | POA: Diagnosis not present

## 2019-10-03 NOTE — Patient Instructions (Signed)
Medication Instructions:  Your physician recommends that you continue on your current medications as directed. Please refer to the Current Medication list given to you today.  *If you need a refill on your cardiac medications before your next appointment, please call your pharmacy*   Lab Work: None If you have labs (blood work) drawn today and your tests are completely normal, you will receive your results only by: Marland Kitchen MyChart Message (if you have MyChart) OR . A paper copy in the mail If you have any lab test that is abnormal or we need to change your treatment, we will call you to review the results.   Testing/Procedures: None   Follow-Up: At [facility], you and your health needs are our priority.  As part of our continuing mission to provide you with exceptional heart care, we have created designated Provider Care Teams.  These Care Teams include your primary Cardiologist (physician) and Advanced Practice Providers (APPs -  Physician Assistants and Nurse Practitioners) who all work together to provide you with the care you need, when you need it.  We recommend signing up for the patient portal called "MyChart".  Sign up information is provided on this After Visit Summary.  MyChart is used to connect with patients for Virtual Visits (Telemedicine).  Patients are able to view lab/test results, encounter notes, upcoming appointments, etc.  Non-urgent messages can be sent to your provider as well.   To learn more about what you can do with MyChart, go to [email or web address].    Your next appointment:   6 month(s)  The format for your next appointment:   In Person  Provider:    You may see Debbe Odea, MD or one of the following Advanced Practice Providers on your designated Care Team:    Nicolasa Ducking, NP  Eula Listen, PA-C  Marisue Ivan, PA-C

## 2019-10-03 NOTE — Progress Notes (Signed)
Cardiology Office Note:    Date:  10/03/2019   ID:  Sara Andrews, DOB [birth year], MRN [id number]  PCP:  Sara Sizer, MD  Cardiologist:  Sara Sable, MD  Electrophysiologist:  None   Referring MD: Sara Sizer, MD   Chief Complaint  Patient presents with  . other    Follow up Cardiac CT & Echo. Meds reviewed by the pt. verbally. Pt. c/o shortness of breath at times.     History of Present Illness:    Sara Andrews is a 64 y.o. female with a hx of diabetes, hyperlipidemia, COPD, former smoker x30 years who presents for follow-up.  She was last seen due to a 3-week history of chest pain.    She describes pain as similar to having indigestion in the central chest but without burning.  Pain occurs about 2-3 times a week and lasts about 30 minutes.  She rates pain as about 4 out of 10 in severity.  Pain is not related with exertion.  She states having chronic shortness of breath with exertion due to long history of COPD. She had a recent CT chest for lung cancer screening which showed calcified atherosclerotic plaque in the left main, LAD and left circumflex arteries.  Echocardiogram and coronary CTA was ordered.  Patient now presents for results.  Patient states doing okay.  Has occasional chest discomfort/shortness of breath.  Past Medical History:  Diagnosis Date  . Anxiety   . Arthritis    "hips, hands, back" (03/10/2016)  . Asthma   . Chronic bronchitis ([place])   . COPD (chronic obstructive pulmonary disease) ([place])   . Cough   . Depression   . Esophagitis, reflux   . GERD (gastroesophageal reflux disease)   . Hyperlipidemia   . Hypothyroidism   . IBS (irritable bowel syndrome)   . Lumbago   . Muscle pain   . Osteoarthritis   . Sinus disorder   . Thyroid disease   . Type 2 diabetes, diet controlled ([place])   . Uncomplicated herpes simplex     Past Surgical History:  Procedure Laterality Date  . ABDOMINAL HYSTERECTOMY    . CARPAL TUNNEL RELEASE  Right   . LUMBAR LAMINECTOMY  03/16/2018   L4-5 Laminectomy & Fusion w/ Pedicle Screws, TLIF & Allograft; Surgeon: Sara Client, MD; Location: [facility] MAIN OR; Service: Orthopedics; Laterality: N/A; Prone, ProAxis, Stim Neuromonitoring, O-Arm, Cell Saver, [place], [employer], Justin, PACS on [facility]    . OOPHORECTOMY    . ORIF TIBIA PLATEAU Left 03/10/2016   Procedure: OPEN REDUCTION INTERNAL FIXATION (ORIF) BICONDYLAR TIBIAL PLATEAU FRACTURE;  Surgeon: Sara Killings, MD;  Location: [place];  Service: Orthopedics;  Laterality: Left;  . SHOULDER ARTHROSCOPY W/ ROTATOR CUFF REPAIR Right   . TONSILLECTOMY AND ADENOIDECTOMY      Current Medications: Current Meds  Medication Sig  . albuterol (PROVENTIL) (2.5 MG/3ML) 0.083% nebulizer solution Take 3 mLs (2.5 mg total) by nebulization every 6 (six) hours as needed for wheezing or shortness of breath.  Marland Kitchen aspirin EC 81 MG tablet Take 1 tablet (81 mg total) by mouth daily.  . baclofen (LIORESAL) 10 MG tablet Take 1 tablet by mouth daily as needed.  Marland Kitchen buPROPion (WELLBUTRIN XL) 150 MG 24 hr tablet Take 1 tablet (150 mg total) by mouth daily.  . Cholecalciferol (VITAMIN D PO) Take 1 capsule by mouth daily.  . diclofenac Sodium (VOLTAREN) 1 % GEL Apply topically 4 (four) times daily.  . DULoxetine (CYMBALTA) 60 MG  capsule Take 1 capsule (60 mg total) by mouth daily.  . fluocinonide (LIDEX) 0.05 % external solution APP EXT AA BID  . levothyroxine (SYNTHROID) 75 MCG tablet TAKE 1 TABLET(75 MCG) BY MOUTH DAILY  . Magnesium Oxide 400 MG CAPS Take 1 capsule (400 mg total) by mouth once.  . Melatonin 5 MG CAPS Take 1 capsule by mouth daily.  . meloxicam (MOBIC) 15 MG tablet TAKE 1 TABLET(15 MG) BY MOUTH DAILY  . montelukast (SINGULAIR) 10 MG tablet Take 1 tablet (10 mg total) by mouth at bedtime.  Marland Kitchen omeprazole (PRILOSEC) 40 MG capsule Daily  . ondansetron (ZOFRAN) 4 MG tablet Take 1 tablet (4 mg total) by mouth every 8 (eight) hours as needed for nausea or  vomiting.  Marland Kitchen PROAIR HFA 108 (90 Base) MCG/ACT inhaler INHALE 2 PUFFS INTO THE LUNGS EVERY 6 HOURS AS NEEDED FOR WHEEZING OR SHORTNESS OF BREATH  . rosuvastatin (CRESTOR) 20 MG tablet TAKE 1 TABLET BY MOUTH DAILY  . SUMAtriptan (IMITREX) 50 MG tablet May repeat in 2 hours if headache persists or recurs. Do not take more than 3 in 24 hours.  . traZODone (DESYREL) 100 MG tablet Take 1 tablet (100 mg total) by mouth at bedtime.  Marland Kitchen umeclidinium-vilanterol (ANORO ELLIPTA) 62.5-25 MCG/INH AEPB Inhale 1 puff into the lungs daily.     Allergies:   Patient has no known allergies.   Social History   Socioeconomic History  . Marital status: Widowed    Spouse name: Not on file  . Number of children: 3  . Years of education: Not on file  . Highest education level: 10th grade  Occupational History  . Occupation: disabled    Comment: retired  Tobacco Use  . Smoking status: Former Smoker    Packs/day: 2.00    Years: 30.00    Pack years: 60.00    Quit date: 09/03/2008    Years since quitting: 11.0  . Smokeless tobacco: Never Used  Substance and Sexual Activity  . Alcohol use: No  . Drug use: No  . Sexual activity: Never  Other Topics Concern  . Not on file  Social History Narrative   She used to work at  [employer], but had a left knee work related injury 03/10/2016 , require left knee surgery and is now having back pain, that is getting evaluated, Out of work since.       Patient recently found out that one of her twins Sara Andrews) was diagnosed with breast cancer); will be having a double mastectomy soon.      Social Determinants of Health   Financial Resource Strain: Low Risk   . Difficulty of Paying Living Expenses: Not hard at all  Food Insecurity: No Food Insecurity  . Worried About Programme researcher, broadcasting/film/video in the Last Year: Never true  . Ran Out of Food in the Last Year: Never true  Transportation Needs: No Transportation Needs  . Lack of Transportation (Medical): No  . Lack of  Transportation (Non-Medical): No  Physical Activity: Sufficiently Active  . Days of Exercise per Week: 7 days  . Minutes of Exercise per Session: 40 min  Stress: No Stress Concern Present  . Feeling of Stress : Not at all  Social Connections: Not Isolated  . Frequency of Communication with Friends and Family: More than three times a week  . Frequency of Social Gatherings with Friends and Family: More than three times a week  . Attends Religious Services: More than 4 times per year  .  Active Member of Clubs or Organizations: Yes  . Attends Banker Meetings: More than 4 times per year  . Marital Status: Married     Family History: The patient's family history includes Alcohol abuse in her sister; Anxiety disorder in her daughter and sister; Bipolar disorder in her sister; Breast cancer in her daughter; Breast cancer (age of onset: 22) in her maternal aunt and paternal aunt; Depression in her sister; Diabetes in her father; Heart disease in her father; Hypertension in her daughter; Pneumonia in her sister; Skin cancer in her daughter.  ROS:   Please see the history of present illness.     All other systems reviewed and are negative.  EKGs/Labs/Other Studies Reviewed:    The following studies were reviewed today:   EKG:  EKG is  ordered today.  The ekg ordered today demonstrates normal sinus rhythm, normal ECG  Recent Labs: 03/11/2019: Hemoglobin 12.9; Platelets 225 05/11/2019: ALT 24 08/23/2019: TSH 0.39 08/30/2019: BUN 10; Creatinine, Ser 0.84; Potassium 4.6; Sodium 143  Recent Lipid Panel    Component Value Date/Time   CHOL 105 03/11/2019 0918   TRIG 49 03/11/2019 0918   HDL 60 03/11/2019 0918   CHOLHDL 1.8 03/11/2019 0918   CHOLHDL 2.0 02/03/2018 1146   LDLCALC 35 03/11/2019 0918   LDLCALC 39 02/03/2018 1146    Physical Exam:    VS:  BP 100/68 (BP Location: Left Arm, Patient Position: Sitting, Cuff Size: Normal)   Pulse 65   Ht 4\' 9"  (1.448 m)   Wt 142 lb 8  oz (64.6 kg)   SpO2 98%   BMI 30.84 kg/m     Wt Readings from Last 3 Encounters:  10/03/19 142 lb 8 oz (64.6 kg)  08/30/19 140 lb (63.5 kg)  08/23/19 138 lb (62.6 kg)     GEN:  Well nourished, well developed in no acute distress HEENT: Normal NECK: No JVD; No carotid bruits LYMPHATICS: No lymphadenopathy CARDIAC: RRR, no murmurs, rubs, gallops RESPIRATORY: Decreased breath sounds, rhonchi at bases. ABDOMEN: Soft, non-tender, non-distended MUSCULOSKELETAL:  No edema; No deformity  SKIN: Warm and dry NEUROLOGIC:  Alert and oriented x 3 PSYCHIATRIC:  Normal affect   ASSESSMENT:    1. Chest pain of uncertain etiology   2. Coronary artery calcification   3. Pure hypercholesterolemia    PLAN:    In order of problems listed above:  1. Patient with history of atypical chest pain, heart risk factors of hyperlipidemia, diabetes, prior smoking history.  Echocardiogram showed normal systolic and diastolic function, EF 60 to 65%.  Coronary CT showed mild to moderate stenosis in the left circumflex, approximately 50%.  CT FFR did not reveal any evidence for ischemia. 2. Coronary artery calcifications noted on screening lung CT scan.  Coronary artery calcium score 185.  Continue aspirin 81 mg, Crestor 20 mg daily. 3. Continue Crestor as currently prescribed for hyperlipidemia.  Follow-up in 6 months.  This note was generated in part or whole with voice recognition software. Voice recognition is usually quite accurate but there are transcription errors that can and very often do occur. I apologize for any typographical errors that were not detected and corrected.  Medication Adjustments/Labs and Tests Ordered: Current medicines are reviewed at length with the patient today.  Concerns regarding medicines are outlined above.  Orders Placed This Encounter  Procedures  . EKG 12-Lead   No orders of the defined types were placed in this encounter.   Patient Instructions  Medication  Instructions:  Your physician recommends that you continue on your current medications as directed. Please refer to the Current Medication list given to you today.  *If you need a refill on your cardiac medications before your next appointment, please call your pharmacy*   Lab Work: None If you have labs (blood work) drawn today and your tests are completely normal, you will receive your results only by: Marland Kitchen MyChart Message (if you have MyChart) OR . A paper copy in the mail If you have any lab test that is abnormal or we need to change your treatment, we will call you to review the results.   Testing/Procedures: None   Follow-Up: At [facility], you and your health needs are our priority.  As part of our continuing mission to provide you with exceptional heart care, we have created designated Provider Care Teams.  These Care Teams include your primary Cardiologist (physician) and Advanced Practice Providers (APPs -  Physician Assistants and Nurse Practitioners) who all work together to provide you with the care you need, when you need it.  We recommend signing up for the patient portal called "MyChart".  Sign up information is provided on this After Visit Summary.  MyChart is used to connect with patients for Virtual Visits (Telemedicine).  Patients are able to view lab/test results, encounter notes, upcoming appointments, etc.  Non-urgent messages can be sent to your provider as well.   To learn more about what you can do with MyChart, go to [email or web address].    Your next appointment:   6 month(s)  The format for your next appointment:   In Person  Provider:    You may see Debbe Odea, MD or one of the following Advanced Practice Providers on your designated Care Team:    Nicolasa Ducking, NP  Eula Listen, PA-C  Marisue Ivan, PA-C         Signed, Debbe Odea, MD  10/03/2019 12:03 PM    [facility]

## 2019-10-04 ENCOUNTER — Other Ambulatory Visit: Payer: Self-pay | Admitting: Family Medicine

## 2019-10-04 DIAGNOSIS — E038 Other specified hypothyroidism: Secondary | ICD-10-CM

## 2019-10-04 NOTE — Telephone Encounter (Signed)
Requested Prescriptions  Pending Prescriptions Disp Refills  . levothyroxine (SYNTHROID) 75 MCG tablet [Pharmacy Med Name: LEVOTHYROXINE 0.075MG  ( ) TABS] 30 tablet 0    Sig: TAKE 1 TABLET(75 MCG) BY MOUTH DAILY     Endocrinology:  Hypothyroid Agents Failed - 10/04/2019 10:21 AM      Failed - TSH needs to be rechecked within 3 months after an abnormal result. Refill until TSH is due.      Failed - TSH in normal range and within 360 days    TSH  Date Value Ref Range Status  08/23/2019 0.39 (L) 0.40 - 4.50 mIU/L Final         Passed - Valid encounter within last 12 months    Recent Outpatient Visits          1 month ago Dyslipidemia associated with type 2 diabetes mellitus [facility])   [facility] Alba Cory, MD   2 months ago [facility] [employer] preventive visit   [facility] Alba Cory, MD   5 months ago Abscess or cellulitis of scalp   [facility] Doren Custard, FNP   5 months ago Herpes zoster with complication   [facility] Doren Custard, FNP   6 months ago Major depression in remission [facility])   [facility] Alba Cory, MD      Future Appointments            In 3 months Carlynn Purl, Danna Hefty, MD [facility], [facility]   In 6 months Agbor-Etang, Arlys John, MD [facility]

## 2019-10-11 LAB — HM DIABETES EYE EXAM

## 2019-10-28 ENCOUNTER — Telehealth: Payer: Self-pay | Admitting: *Deleted

## 2019-10-28 DIAGNOSIS — R918 Other nonspecific abnormal finding of lung field: Secondary | ICD-10-CM

## 2019-10-28 DIAGNOSIS — Z87891 Personal history of nicotine dependence: Secondary | ICD-10-CM

## 2019-10-28 NOTE — Telephone Encounter (Signed)
Contacted and scheduled for LCS nodule follow up scan 

## 2019-11-01 ENCOUNTER — Telehealth: Payer: Self-pay

## 2019-11-01 NOTE — Telephone Encounter (Signed)
Patient informed of low dose lung cancer screening CT scan appointment on 11/03/19 @ 11:00.

## 2019-11-03 ENCOUNTER — Ambulatory Visit
Admission: RE | Admit: 2019-11-03 | Discharge: 2019-11-03 | Disposition: A | Discharge status: Home / Self Care | Payer: Medicare Other | Source: Ambulatory Visit | Attending: Oncology | Admitting: Oncology

## 2019-11-03 ENCOUNTER — Other Ambulatory Visit: Payer: Self-pay

## 2019-11-03 DIAGNOSIS — Z122 Encounter for screening for malignant neoplasm of respiratory organs: Secondary | ICD-10-CM | POA: Diagnosis not present

## 2019-11-03 DIAGNOSIS — I2510 Atherosclerotic heart disease of native coronary artery without angina pectoris: Secondary | ICD-10-CM | POA: Diagnosis not present

## 2019-11-03 DIAGNOSIS — R918 Other nonspecific abnormal finding of lung field: Secondary | ICD-10-CM

## 2019-11-03 DIAGNOSIS — Z87891 Personal history of nicotine dependence: Secondary | ICD-10-CM | POA: Diagnosis not present

## 2019-11-03 DIAGNOSIS — J439 Emphysema, unspecified: Secondary | ICD-10-CM | POA: Insufficient documentation

## 2019-11-03 DIAGNOSIS — I700 Atherosclerosis of aorta: Secondary | ICD-10-CM | POA: Insufficient documentation

## 2019-11-07 ENCOUNTER — Encounter: Payer: Self-pay | Admitting: *Deleted

## 2019-12-30 ENCOUNTER — Other Ambulatory Visit: Payer: Self-pay | Admitting: Family Medicine

## 2019-12-30 DIAGNOSIS — E038 Other specified hypothyroidism: Secondary | ICD-10-CM

## 2020-01-02 ENCOUNTER — Other Ambulatory Visit: Payer: Self-pay | Admitting: Family Medicine

## 2020-01-02 DIAGNOSIS — K219 Gastro-esophageal reflux disease without esophagitis: Secondary | ICD-10-CM

## 2020-01-10 ENCOUNTER — Ambulatory Visit: Payer: Medicare Other | Admitting: Family Medicine

## 2020-01-12 ENCOUNTER — Encounter: Payer: Self-pay | Admitting: Family Medicine

## 2020-01-12 ENCOUNTER — Ambulatory Visit (INDEPENDENT_AMBULATORY_CARE_PROVIDER_SITE_OTHER): Payer: Medicare Other | Admitting: Family Medicine

## 2020-01-12 ENCOUNTER — Other Ambulatory Visit: Payer: Self-pay

## 2020-01-12 VITALS — BP 114/80 | HR 98 | Temp 97.5°F | Resp 16 | Ht <= 58 in | Wt 145.2 lb

## 2020-01-12 DIAGNOSIS — M810 Age-related osteoporosis without current pathological fracture: Secondary | ICD-10-CM

## 2020-01-12 DIAGNOSIS — K219 Gastro-esophageal reflux disease without esophagitis: Secondary | ICD-10-CM | POA: Diagnosis not present

## 2020-01-12 DIAGNOSIS — J432 Centrilobular emphysema: Secondary | ICD-10-CM | POA: Insufficient documentation

## 2020-01-12 DIAGNOSIS — E1169 Type 2 diabetes mellitus with other specified complication: Secondary | ICD-10-CM | POA: Diagnosis not present

## 2020-01-12 DIAGNOSIS — I700 Atherosclerosis of aorta: Secondary | ICD-10-CM

## 2020-01-12 DIAGNOSIS — E785 Hyperlipidemia, unspecified: Secondary | ICD-10-CM | POA: Diagnosis not present

## 2020-01-12 DIAGNOSIS — F325 Major depressive disorder, single episode, in full remission: Secondary | ICD-10-CM | POA: Insufficient documentation

## 2020-01-12 DIAGNOSIS — G43009 Migraine without aura, not intractable, without status migrainosus: Secondary | ICD-10-CM

## 2020-01-12 DIAGNOSIS — J449 Chronic obstructive pulmonary disease, unspecified: Secondary | ICD-10-CM

## 2020-01-12 DIAGNOSIS — R8290 Unspecified abnormal findings in urine: Secondary | ICD-10-CM

## 2020-01-12 DIAGNOSIS — E038 Other specified hypothyroidism: Secondary | ICD-10-CM

## 2020-01-12 DIAGNOSIS — D692 Other nonthrombocytopenic purpura: Secondary | ICD-10-CM

## 2020-01-12 DIAGNOSIS — F5105 Insomnia due to other mental disorder: Secondary | ICD-10-CM

## 2020-01-12 LAB — POCT URINALYSIS DIPSTICK (MANUAL)
Leukocytes, UA: NEGATIVE
Nitrite, UA: NEGATIVE
Poct Bilirubin: NEGATIVE
Poct Blood: NEGATIVE
Poct Glucose: NORMAL mg/dL
Poct Ketones: NEGATIVE
Poct Protein: NEGATIVE mg/dL
Poct Urobilinogen: NORMAL mg/dL
Spec Grav, UA: 1.01 (ref 1.010–1.025)
pH, UA: 6.5 (ref 5.0–8.0)

## 2020-01-12 LAB — POCT GLYCOSYLATED HEMOGLOBIN (HGB A1C): Hemoglobin A1C: 5.6 % (ref 4.0–5.6)

## 2020-01-12 MED ORDER — SUMATRIPTAN SUCCINATE 50 MG PO TABS: ORAL_TABLET | ORAL | 0 refills | Status: DC

## 2020-01-12 MED ORDER — TRAZODONE HCL 100 MG PO TABS: 100.0000 mg | ORAL_TABLET | Freq: Every evening | ORAL | 1 refills | Status: DC

## 2020-01-12 MED ORDER — OMEPRAZOLE 40 MG PO CPDR: DELAYED_RELEASE_CAPSULE | ORAL | 0 refills | Status: DC

## 2020-01-12 MED ORDER — TRELEGY ELLIPTA 100-62.5-25 MCG/INH IN AEPB: 1.0000 | INHALATION_SPRAY | Freq: Every day | RESPIRATORY_TRACT | 2 refills | Status: DC

## 2020-01-12 MED ORDER — ROSUVASTATIN CALCIUM 20 MG PO TABS: 20.0000 mg | ORAL_TABLET | Freq: Every day | ORAL | 1 refills | Status: DC

## 2020-01-12 MED ORDER — MONTELUKAST SODIUM 10 MG PO TABS: 10.0000 mg | ORAL_TABLET | Freq: Every day | ORAL | 1 refills | Status: DC

## 2020-01-12 MED ORDER — DULOXETINE HCL 60 MG PO CPEP: 60.0000 mg | ORAL_CAPSULE | Freq: Every day | ORAL | 1 refills | Status: DC

## 2020-01-12 MED ORDER — BUPROPION HCL ER (XL) 150 MG PO TB24: 150.0000 mg | ORAL_TABLET | Freq: Every day | ORAL | 1 refills | Status: DC

## 2020-01-12 NOTE — Progress Notes (Signed)
Name: Sara Andrews   MRN: [id number]    DOB: 06/04/1956   Date:01/12/2020       Progress Note  Subjective  Chief Complaint  Chief Complaint  Patient presents with  . COPD  . Gastroesophageal Reflux  . Hypothyroidism  . Hyperlipidemia  . Hyperglycemia    HPI   Recurrent headaches: doing well on prn imitrex and zofran, she states not as frequent  Senile Purpura: she continues to bruise easily on arms, she is on aspirin, gave her reassuranc  DDD andspondylolisthesis:doing well since surgery done 02/2018. She is off pain  medication, taking prn tylenol only and since pain is under control,she used to see Dr. Noel Gerold but not seeing anyone right now, she still has some baclofen at home     COPD:she had one flare in 2020, and one flare in 2019 , one month ago she had increase in productive cough , sputum was green in color, coughing spells. She tried otc mucinex D and symptoms have improved, still has intermittent coughing spells with white sputum, no wheezing or SOB. She is only using Anoro a few times a week because of cost. We will try switching to Trelegy but explained compliance with decrease flares. She quit smoking in 2009.  GERD: shestopped Omeprazole however she has noticed left side chest pain improved recently with when she took medication, she will try to take it daily and see if chest pain resolves. She states omeprazole causes nausea, advised to try taking it before lunch   Hypothyroidism: She is currently taking 75 mcg daily and half on Sundays, last TSH was suppressed and we will recheck it today . She has gained weight back, no palpation or diarrhea.   DM II : diagnosed at work back in 2012she used to take medications,A1C at goal.She denies polyphagia, polyuria , she has polydipsia. Started on Trulicity June 2018,but she was having worsening ofconstipation and indigestion,sowe switched to Ozempic 10/2018weight was 198 lbs and is down to  132.4lbs.She is off Ozempic since January and weight is up to 145.2 lbs. She still has intermittent nausea, discussed referral to GI, but she wants to try taking omeprazole daily first   Major Depression: She is feeling better,no longer seeing Dr. Elna Breslow but taking medication as prescribed . Phq9 is normal, patient is in remission She needs refills of medication. She had a long history of depression but seems to be responding well to her medication. She has more energy, she has been gardening, more motivation.   Recurrent nausea and weight loss: discussed referral to GI and CT abdomen but she still wants to hold off for now. Weight is up since she stopped Ozempic, she will try taking Omeprazole daily   Osteoporosis: discussed osteonecrosis of jaw, she will get it removed by dentist , we will hold off on starting medication until teeth are extracted,, also needs to have nausea under control   Chest pain: intermittently going on for the past couple of months, she was seen by cardiologist. Dr. Azucena Cecil and had EKG and echo that showed normal EF . CT lung had already shown some coronary calcification. Since her first visit with him her chest pain improved, still has intermittent symptoms. She is on crestor and aspirin    Patient Active Problem List   Diagnosis Date Noted  . Centrilobular emphysema (HCC) 01/12/2020  . Senile purpura (HCC) 01/12/2020  . Major depression in remission (HCC) 01/12/2020  . Dyslipidemia associated with type 2 diabetes mellitus (HCC) 01/12/2020  .  Bursitis of hip 08/23/2019  . Carpal tunnel syndrome 08/23/2019  . Thoracic aorta atherosclerosis ([place]) 08/03/2019  . Spondylosis of lumbar spine 11/06/2016  . Spinal stenosis of lumbar region with neurogenic claudication 10/28/2016  . Chronic bilateral low back pain with bilateral sciatica 09/16/2016  . Hyperglycemia 08/07/2016  . Moderate episode of recurrent major depressive disorder ([place]) 08/07/2016  . Pain of  left heel 06/02/2016  . Degenerative cervical disc 03/04/2016  . Gastroesophageal reflux disease without esophagitis 02/01/2016  . Primary osteoarthritis of right hand 02/01/2016  . Paresthesias in left hand 02/01/2016  . SOB (shortness of breath) 10/05/2013  . Asthma-COPD overlap syndrome ([place]) 10/05/2013  . History of smoking 10/05/2013  . Hyperlipidemia 10/05/2013  . Adult onset hypothyroidism 10/05/2013    Past Surgical History:  Procedure Laterality Date  . ABDOMINAL HYSTERECTOMY    . CARPAL TUNNEL RELEASE Right   . LUMBAR LAMINECTOMY  03/16/2018   L4-5 Laminectomy & Fusion w/ Pedicle Screws, TLIF & Allograft; Surgeon: Alfredia Client, MD; Location: [facility] MAIN OR; Service: Orthopedics; Laterality: N/A; Prone, ProAxis, Stim Neuromonitoring, O-Arm, Cell Saver, [place], [employer], Justin, PACS on [facility]    . OOPHORECTOMY    . ORIF TIBIA PLATEAU Left 03/10/2016   Procedure: OPEN REDUCTION INTERNAL FIXATION (ORIF) BICONDYLAR TIBIAL PLATEAU FRACTURE;  Surgeon: Marybelle Killings, MD;  Location: [facility];  Service: Orthopedics;  Laterality: Left;  . SHOULDER ARTHROSCOPY W/ ROTATOR CUFF REPAIR Right   . TONSILLECTOMY AND ADENOIDECTOMY      Family History  Problem Relation Age of Onset  . Hypertension Daughter   . Breast cancer Daughter   . Diabetes Father   . Heart disease Father   . Breast cancer Maternal Aunt 60  . Breast cancer Paternal Aunt 52  . Depression Sister   . Alcohol abuse Sister   . Anxiety disorder Sister   . Bipolar disorder Sister   . Pneumonia Sister   . Skin cancer Daughter   . Anxiety disorder Daughter     Social History   Tobacco Use  . Smoking status: Former Smoker    Packs/day: 2.00    Years: 30.00    Pack years: 60.00    Quit date: 09/03/2008    Years since quitting: 11.3  . Smokeless tobacco: Never Used  Substance Use Topics  . Alcohol use: No     Current Outpatient Medications:  .  albuterol (PROVENTIL) (2.5 MG/3ML) 0.083% nebulizer solution, Take  3 mLs (2.5 mg total) by nebulization every 6 (six) hours as needed for wheezing or shortness of breath., Disp: 75 mL, Rfl: 2 .  aspirin EC 81 MG tablet, Take 1 tablet (81 mg total) by mouth daily., Disp: 90 tablet, Rfl: 3 .  baclofen (LIORESAL) 10 MG tablet, Take 1 tablet by mouth daily as needed., Disp: , Rfl:  .  buPROPion (WELLBUTRIN XL) 150 MG 24 hr tablet, Take 1 tablet (150 mg total) by mouth daily., Disp: 90 tablet, Rfl: 1 .  Cholecalciferol (VITAMIN D PO), Take 1 capsule by mouth daily., Disp: , Rfl:  .  diclofenac Sodium (VOLTAREN) 1 % GEL, Apply topically 4 (four) times daily., Disp: , Rfl:  .  DULoxetine (CYMBALTA) 60 MG capsule, Take 1 capsule (60 mg total) by mouth daily., Disp: 90 capsule, Rfl: 1 .  levothyroxine (SYNTHROID) 75 MCG tablet, TAKE 1 TABLET(75 MCG) BY MOUTH DAILY, Disp: 90 tablet, Rfl: 0 .  Magnesium Oxide 400 MG CAPS, Take 1 capsule (400 mg total) by mouth once., Disp: 30  capsule, Rfl: 5 .  Melatonin 5 MG CAPS, Take 1 capsule by mouth daily., Disp: , Rfl:  .  meloxicam (MOBIC) 15 MG tablet, TAKE 1 TABLET(15 MG) BY MOUTH DAILY, Disp: 90 tablet, Rfl: 0 .  montelukast (SINGULAIR) 10 MG tablet, Take 1 tablet (10 mg total) by mouth at bedtime., Disp: 90 tablet, Rfl: 1 .  omeprazole (PRILOSEC) 40 MG capsule, TAKE ONE CAPSULE BY MOUTH DAILY, Disp: 90 capsule, Rfl: 0 .  ondansetron (ZOFRAN) 4 MG tablet, Take 1 tablet (4 mg total) by mouth every 8 (eight) hours as needed for nausea or vomiting., Disp: 20 tablet, Rfl: 0 .  PROAIR HFA 108 (90 Base) MCG/ACT inhaler, INHALE 2 PUFFS INTO THE LUNGS EVERY 6 HOURS AS NEEDED FOR WHEEZING OR SHORTNESS OF BREATH, Disp: 8.5 g, Rfl: 0 .  rosuvastatin (CRESTOR) 20 MG tablet, Take 1 tablet (20 mg total) by mouth daily., Disp: 90 tablet, Rfl: 1 .  SUMAtriptan (IMITREX) 50 MG tablet, May repeat in 2 hours if headache persists or recurs. Do not take more than 3 in 24 hours., Disp: 10 tablet, Rfl: 0 .  traZODone (DESYREL) 100 MG tablet, Take 1-1.5  tablets (100-150 mg total) by mouth at bedtime., Disp: 100 tablet, Rfl: 1 .  Fluticasone-Umeclidin-Vilant (TRELEGY ELLIPTA) 100-62.5-25 MCG/INH AEPB, Inhale 1 puff into the lungs daily., Disp: 60 each, Rfl: 2 .  metoprolol tartrate (LOPRESSOR) 100 MG tablet, Take 1 tablet (100 mg total) by mouth once for 1 dose. Take 2 hours prior to the CT., Disp: 1 tablet, Rfl: 0  No Known Allergies  I personally reviewed active problem list, medication list, allergies, family history, social history with the patient/caregiver today.   ROS  Constitutional: Negative for fever or weight change.  Respiratory: positive for cough and shortness of breath.   Cardiovascular: positive for intermittent chest pain no palpitations.  Gastrointestinal: Negative for abdominal pain, no bowel changes.  Musculoskeletal: Negative for gait problem or joint swelling.  Skin: Positive for intermittent nuchal rash.  Neurological: Negative for dizziness or headache.   No other specific complaints in a complete review of systems (except as listed in HPI above).  Objective  Vitals:   01/12/20 1104  BP: 114/80  Pulse: 98  Resp: 16  Temp: (!) 97.5 F (36.4 C)  TempSrc: Temporal  SpO2: 95%  Weight: 145 lb 3.2 oz (65.9 kg)  Height: 4\' 9"  (1.448 m)    Body mass index is 31.42 kg/m.  Physical Exam  Constitutional: Patient appears well-developed and well-nourished. Obese  No distress.  HEENT: head atraumatic, normocephalic, pupils equal and reactive to light,  neck supple Cardiovascular: Normal rate, regular rhythm and normal heart sounds.  No murmur heard. No BLE edema. Pulmonary/Chest: Effort normal and breath sounds normal. No respiratory distress. Abdominal: Soft.  There is no tenderness. Skin: no rashes at this time Psychiatric: Patient has a normal mood and affect. behavior is normal. Judgment and thought content normal.   Recent Results (from the past 2160 hour(s))  POCT HgB A1C     Status: Normal    Collection Time: 01/12/20 11:18 AM  Result Value Ref Range   Hemoglobin A1C 5.6 4.0 - 5.6 %   HbA1c POC (<> result, manual entry)     HbA1c, POC (prediabetic range)     HbA1c, POC (controlled diabetic range)       PHQ2/9: Depression screen [facility] 2/9 01/12/2020 08/23/2019 07/12/2019 04/14/2019 04/13/2019  Decreased Interest 0 3 0 0 0  Down, Depressed, Hopeless 0 0  0 0 0  PHQ - 2 Score 0 3 0 0 0  Altered sleeping 1 1 0 0 0  Tired, decreased energy 1 0 0 0 0  Change in appetite 1 0 0 0 0  Feeling bad or failure about yourself  0 0 0 0 0  Trouble concentrating 0 0 0 0 0  Moving slowly or fidgety/restless 0 0 0 0 0  Suicidal thoughts 0 0 0 0 0  PHQ-9 Score 3 4 0 0 0  Difficult doing work/chores Not difficult at all Not difficult at all - Not difficult at all Not difficult at all  Some recent data might be hidden    phq 9 is negative   Fall Risk: Fall Risk  01/12/2020 08/23/2019 07/12/2019 04/21/2019 04/14/2019  Falls in the past year? 0 0 0 0 0  Number falls in past yr: 0 0 0 - 0  Injury with Fall? 0 0 0 - 0  Comment - - - - -  Follow up - - - - -      Assessment & Plan  1. Dyslipidemia associated with type 2 diabetes mellitus (HCC)  - POCT HgB A1C  2. Thoracic aorta atherosclerosis (HCC)  - rosuvastatin (CRESTOR) 20 MG tablet; Take 1 tablet (20 mg total) by mouth daily.  Dispense: 90 tablet; Refill: 1  3. Dyslipidemia  - rosuvastatin (CRESTOR) 20 MG tablet; Take 1 tablet (20 mg total) by mouth daily.  Dispense: 90 tablet; Refill: 1  4. Gastroesophageal reflux disease without esophagitis  - omeprazole (PRILOSEC) 40 MG capsule; TAKE ONE CAPSULE BY MOUTH DAILY  Dispense: 90 capsule; Refill: 0  5. Adult onset hypothyroidism  - TSH  6. Insomnia due to mental disorder  - traZODone (DESYREL) 100 MG tablet; Take 1-1.5 tablets (100-150 mg total) by mouth at bedtime.  Dispense: 100 tablet; Refill: 1  7. Migraine without aura and without status migrainosus, not  intractable  - SUMAtriptan (IMITREX) 50 MG tablet; May repeat in 2 hours if headache persists or recurs. Do not take more than 3 in 24 hours.  Dispense: 10 tablet; Refill: 0  8. Asthma-COPD overlap syndrome (HCC)  - Fluticasone-Umeclidin-Vilant (TRELEGY ELLIPTA) 100-62.5-25 MCG/INH AEPB; Inhale 1 puff into the lungs daily.  Dispense: 60 each; Refill: 2 - montelukast (SINGULAIR) 10 MG tablet; Take 1 tablet (10 mg total) by mouth at bedtime.  Dispense: 90 tablet; Refill: 1  9. Major depression in remission (HCC)  - DULoxetine (CYMBALTA) 60 MG capsule; Take 1 capsule (60 mg total) by mouth daily.  Dispense: 90 capsule; Refill: 1 - buPROPion (WELLBUTRIN XL) 150 MG 24 hr tablet; Take 1 tablet (150 mg total) by mouth daily.  Dispense: 90 tablet; Refill: 1  10. Senile purpura (HCC)  Stable   11. Abnormal urine odor  - CULTURE, URINE COMPREHENSIVE  12. Centrilobular emphysema (HCC)  Switch from Anoro to Trelegy  13. Age-related osteoporosis without current pathological fracture  Discussed high calcium diet and vitamin d supplementation

## 2020-01-14 LAB — CULTURE, URINE COMPREHENSIVE
MICRO NUMBER:: 10604878
RESULT:: NO GROWTH
SPECIMEN QUALITY:: ADEQUATE

## 2020-01-14 LAB — TSH: TSH: 1.03 mIU/L (ref 0.40–4.50)

## 2020-01-17 ENCOUNTER — Telehealth: Payer: Self-pay | Admitting: Family Medicine

## 2020-01-17 NOTE — Telephone Encounter (Signed)
Pt states the Fluticasone-Umeclidin-Vilant (TRELEGY ELLIPTA) 100-62.5-25 MCG/INH AEPB  Was $142 out of pocket for the pt.  She did not pick this up.'pt wants to know if there is anything comparable to this inhaler the dr can prescribe?  [id number] [place], [place] - [address] AT [facility] OF [address] [facility] Phone:  [phone]  Fax:  [phone]

## 2020-01-18 NOTE — Telephone Encounter (Signed)
Called patient. I put a coupon up front for her to try. She will come by and pick up.

## 2020-03-17 ENCOUNTER — Other Ambulatory Visit: Payer: Self-pay | Admitting: Family Medicine

## 2020-03-17 DIAGNOSIS — L299 Pruritus, unspecified: Secondary | ICD-10-CM

## 2020-03-17 DIAGNOSIS — F325 Major depressive disorder, single episode, in full remission: Secondary | ICD-10-CM

## 2020-03-18 ENCOUNTER — Other Ambulatory Visit: Payer: Self-pay | Admitting: Family Medicine

## 2020-03-18 DIAGNOSIS — J449 Chronic obstructive pulmonary disease, unspecified: Secondary | ICD-10-CM

## 2020-03-18 DIAGNOSIS — E785 Hyperlipidemia, unspecified: Secondary | ICD-10-CM

## 2020-03-18 DIAGNOSIS — I700 Atherosclerosis of aorta: Secondary | ICD-10-CM

## 2020-03-18 DIAGNOSIS — E038 Other specified hypothyroidism: Secondary | ICD-10-CM

## 2020-03-18 DIAGNOSIS — M7550 Bursitis of unspecified shoulder: Secondary | ICD-10-CM

## 2020-03-18 DIAGNOSIS — F325 Major depressive disorder, single episode, in full remission: Secondary | ICD-10-CM

## 2020-03-18 NOTE — Telephone Encounter (Signed)
Requested Prescriptions  Pending Prescriptions Disp Refills   OZEMPIC, 0.25 OR 0.5 MG/DOSE, 2 MG/1.5ML SOPN [Pharmacy Med Name: OZEMPIC 0.25 OR 0.5MG /DOS 1X2MG  PEN] 9 mL 0    Sig: INJECT 0.5 MG UNDER THE SKIN ONCE A WEEK     Endocrinology:  Diabetes - GLP-1 Receptor Agonists Passed - 03/18/2020  3:15 AM      Passed - HBA1C is between 0 and 7.9 and within 180 days    Hemoglobin A1C  Date Value Ref Range Status  01/12/2020 5.6 4.0 - 5.6 % Final  01/21/2017 6.0  Final   HbA1c, POC (controlled diabetic range)  Date Value Ref Range Status  08/23/2019 5.5 0.0 - 7.0 % Final         Passed - Valid encounter within last 6 months    Recent Outpatient Visits          2 months ago Dyslipidemia associated with type 2 diabetes mellitus [facility])   [facility] Alba Cory, MD   6 months ago Dyslipidemia associated with type 2 diabetes mellitus [facility])   [facility] Alba Cory, MD   8 months ago Welcome to [employer] preventive visit   [facility]ba Cory, MD   11 months ago Abscess or cellulitis of scalp   [facility] Doren Custard, FNP   11 months ago Herpes zoster with complication   [facility] [place], Gerome Apley, [place]      Future Appointments            In 2 weeks Agbor-Etang, Arlys John, MD [facility], [facility]   In 1 month Alba Cory, MD [facility], [facility]            rosuvastatin (CRESTOR) 20 MG tablet [Pharmacy Med Name: ROSUVASTATIN 20MG  TABLETS] 90 tablet 1    Sig: TAKE 1 TABLET BY MOUTH DAILY     Cardiovascular:  Antilipid - Statins Failed - 03/18/2020  3:15 AM      Failed - Total Cholesterol in normal range and within 360 days    Cholesterol, Total  Date Value Ref Range Status  03/11/2019 105 100 - 199 mg/dL Final         Failed - LDL in normal range and within 360 days    LDL Cholesterol (Calc)  Date Value Ref Range Status   02/03/2018 39 mg/dL (calc) Final    Comment:    Reference range: <100 . Desirable range <100 mg/dL for primary prevention;   <70 mg/dL for patients with CHD or diabetic patients  with > or = 2 CHD risk factors. 04/06/2018 LDL-C is now calculated using the Martin-Hopkins  calculation, which is a validated novel method providing  better accuracy than the Friedewald equation in the  estimation of LDL-C.  Marland Kitchen et al. Horald Pollen. Lenox Ahr): 2061-2068  (http://education.[employer].com/faq/FAQ164)    LDL Calculated  Date Value Ref Range Status  03/11/2019 35 0 - 99 mg/dL Final         Failed - HDL in normal range and within 360 days    HDL  Date Value Ref Range Status  03/11/2019 60 >39 mg/dL Final         Failed - Triglycerides in normal range and within 360 days    Triglycerides  Date Value Ref Range Status  03/11/2019 49 0 - 149 mg/dL Final         Passed - Patient is not pregnant  Passed - Valid encounter within last 12 months    Recent Outpatient Visits          2 months ago Dyslipidemia associated with type 2 diabetes mellitus [facility])   [facility] [place], Danna Hefty, MD   6 months ago Dyslipidemia associated with type 2 diabetes mellitus [facility])   [facility] Alba Cory, MD   8 months ago Welcome to [employer] preventive visit   [facility] Alba Cory, MD   11 months ago Abscess or cellulitis of scalp   [facility] Doren Custard, FNP   11 months ago Herpes zoster with complication   [facility] Doren Custard, FNP      Future Appointments            In 2 weeks Agbor-Etang, Arlys John, MD [facility], [facility]   In 1 month Alba Cory, MD [facility], [facility]            buPROPion (WELLBUTRIN XL) 150 MG 24 hr tablet [Pharmacy Med Name: BUPROPION XL 150MG  TABLETS (24 H)] 90 tablet 1    Sig: TAKE 1 TABLET(150 MG) BY MOUTH  DAILY     Psychiatry: Antidepressants - bupropion Passed - 03/18/2020  3:15 AM      Passed - Completed PHQ-2 or PHQ-9 in the last 360 days.      Passed - Last BP in normal range    BP Readings from Last 1 Encounters:  01/12/20 114/80         Passed - Valid encounter within last 6 months    Recent Outpatient Visits          2 months ago Dyslipidemia associated with type 2 diabetes mellitus [facility])   [facility] [place], [place], MD   6 months ago Dyslipidemia associated with type 2 diabetes mellitus [facility])   [facility], MD   8 months ago Welcome to Alba Cory preventive visit   [facility], MD   11 months ago Abscess or cellulitis of scalp   [facility], FNP   11 months ago Herpes zoster with complication   [facility] [place], [place], Gerome Apley      Future Appointments            In 2 weeks Agbor-Etang, [place], MD [facility], [facility]   In 1 month [facility], MD [facility], [facility]            meloxicam (MOBIC) 15 MG tablet [Pharmacy Med Name: MELOXICAM 15MG  TABLETS] 90 tablet 0    Sig: TAKE 1 TABLET(15 MG) BY MOUTH DAILY     Analgesics:  COX2 Inhibitors Failed - 03/18/2020  3:15 AM      Failed - HGB in normal range and within 360 days    Hemoglobin  Date Value Ref Range Status  03/11/2019 12.9 11.1 - 15.9 g/dL Final         Passed - Cr in normal range and within 360 days    Creat  Date Value Ref Range Status  02/03/2018 0.92 0.50 - 0.99 mg/dL Final    Comment:    For patients >59 years of age, the reference limit for Creatinine is approximately 13% higher for people identified as African-American. .    Creatinine, Ser  Date Value Ref Range Status  08/30/2019 0.84 0.57 - 1.00 mg/dL Final  Passed - Patient is not pregnant      Passed - Valid encounter within last 12 months     Recent Outpatient Visits          2 months ago Dyslipidemia associated with type 2 diabetes mellitus [facility])   [facility] [place], Danna Hefty, MD   6 months ago Dyslipidemia associated with type 2 diabetes mellitus [facility])   [facility] Alba Cory, MD   8 months ago Welcome to [employer] preventive visit   [facility] Al[facility] Cory, MD   11 months ago Abscess or cellulitis of scalp   [facility] Doren Custard, [place]   11 months ago Herpes zoster with complication   [facility] Doren Custard, FNP      Future Appointments            In 2 weeks Agbor-Etang, Arlys John, MD [facility], [facility]   In 1 month Alba Cory, MD [facility], [facility]            ANORO ELLIPTA 62.5-25 MCG/INH AEPB [Pharmacy Med Name: Ernestina Patches 62.5-25 ORAL INH(30S)] 60 each 5    Sig: INHALE 1 PUFF INTO THE LUNGS DAILY     Pulmonology:  Combination Products Passed - 03/18/2020  3:15 AM      Passed - Valid encounter within last 12 months    Recent Outpatient Visits          2 months ago Dyslipidemia associated with type 2 diabetes mellitus [facility])   [facility] [place], Danna Hefty, MD   6 months ago Dyslipidemia associated with type 2 diabetes mellitus [facility])   [facility] Alba Cory, MD   8 months ago Welcome to [employer] preventive visit   [facility] Alba Cory, MD   11 months ago Abscess or cellulitis of scalp   [facility] Doren Custard, FNP   11 months ago Herpes zoster with complication   [facility] Doren Custard, FNP      Future Appointments            In 2 weeks Agbor-Etang, Arlys John, MD [facility], [facility]   In 1 month Alba Cory, MD [facility], [facility]            levothyroxine (SYNTHROID) 88 MCG tablet  [Pharmacy Med Name: LEVOTHYROXINE 0.088MG  ( ) TAB] 90 tablet 1    Sig: TAKE 1 TABLET(88 MCG) BY MOUTH DAILY BEFORE BREAKFAST     Endocrinology:  Hypothyroid Agents Failed - 03/18/2020  3:15 AM      Failed - TSH needs to be rechecked within 3 months after an abnormal result. Refill until TSH is due.      Passed - TSH in normal range and within 360 days    TSH  Date Value Ref Range Status  01/12/2020 1.03 0.40 - 4.50 mIU/L Final         Passed - Valid encounter within last 12 months    Recent Outpatient Visits          2 months ago Dyslipidemia associated with type 2 diabetes mellitus [facility])   [facility] Alba Cory, MD   6 months ago Dyslipidemia associated with type 2 diabetes mellitus [facility])   [facility] Alba Cory, MD   8 months ago Welcome to [employer] preventive visit   [facility] Alba Cory, MD  11 months ago Abscess or cellulitis of scalp   [facility] Doren CustardBoyce, Emily E, [place]   11 months ago Herpes zoster with complication   [facility] Doren CustardBoyce, Emily E, [place]      Future Appointments            In 2 weeks Agbor-Etang, Arlys JohnBrian, MD [facility], [facility]   In 1 month Alba CorySowles, Krichna, MD [facility], [facility]           Patient is due for lab work. Has an appt in one month to see provider.

## 2020-03-20 ENCOUNTER — Emergency Department: Payer: Medicare Other

## 2020-03-20 ENCOUNTER — Other Ambulatory Visit: Payer: Self-pay

## 2020-03-20 ENCOUNTER — Emergency Department
Admission: EM | Admit: 2020-03-20 | Discharge: 2020-03-20 | Disposition: A | Discharge status: Home / Self Care | Payer: Medicare Other | Attending: Emergency Medicine | Admitting: Emergency Medicine

## 2020-03-20 DIAGNOSIS — Z87891 Personal history of nicotine dependence: Secondary | ICD-10-CM | POA: Diagnosis not present

## 2020-03-20 DIAGNOSIS — E785 Hyperlipidemia, unspecified: Secondary | ICD-10-CM | POA: Diagnosis not present

## 2020-03-20 DIAGNOSIS — J45909 Unspecified asthma, uncomplicated: Secondary | ICD-10-CM | POA: Diagnosis not present

## 2020-03-20 DIAGNOSIS — E119 Type 2 diabetes mellitus without complications: Secondary | ICD-10-CM | POA: Diagnosis not present

## 2020-03-20 DIAGNOSIS — B029 Zoster without complications: Secondary | ICD-10-CM | POA: Diagnosis not present

## 2020-03-20 DIAGNOSIS — Z7982 Long term (current) use of aspirin: Secondary | ICD-10-CM | POA: Diagnosis not present

## 2020-03-20 DIAGNOSIS — J449 Chronic obstructive pulmonary disease, unspecified: Secondary | ICD-10-CM | POA: Diagnosis not present

## 2020-03-20 DIAGNOSIS — E039 Hypothyroidism, unspecified: Secondary | ICD-10-CM | POA: Diagnosis not present

## 2020-03-20 DIAGNOSIS — Z7951 Long term (current) use of inhaled steroids: Secondary | ICD-10-CM | POA: Insufficient documentation

## 2020-03-20 DIAGNOSIS — R109 Unspecified abdominal pain: Secondary | ICD-10-CM | POA: Diagnosis present

## 2020-03-20 LAB — URINALYSIS, COMPLETE (UACMP) WITH MICROSCOPIC
Bacteria, UA: NONE SEEN
Bilirubin Urine: NEGATIVE
Glucose, UA: NEGATIVE mg/dL
Hgb urine dipstick: NEGATIVE
Ketones, ur: NEGATIVE mg/dL
Leukocytes,Ua: NEGATIVE
Nitrite: NEGATIVE
Protein, ur: NEGATIVE mg/dL
Specific Gravity, Urine: 1.004 — ABNORMAL LOW (ref 1.005–1.030)
Squamous Epithelial / HPF: NONE SEEN (ref 0–5)
pH: 8 (ref 5.0–8.0)

## 2020-03-20 LAB — COMPREHENSIVE METABOLIC PANEL
ALT: 37 U/L (ref 0–44)
AST: 31 U/L (ref 15–41)
Albumin: 4.2 g/dL (ref 3.5–5.0)
Alkaline Phosphatase: 75 U/L (ref 38–126)
Anion gap: 9 (ref 5–15)
BUN: 13 mg/dL (ref 8–23)
CO2: 28 mmol/L (ref 22–32)
Calcium: 9.2 mg/dL (ref 8.9–10.3)
Chloride: 102 mmol/L (ref 98–111)
Creatinine, Ser: 0.65 mg/dL (ref 0.44–1.00)
GFR calc Af Amer: >60 mL/min (ref >60)
GFR calc non Af Amer: >60 mL/min (ref >60)
Glucose, Bld: 91 mg/dL (ref 70–99)
Potassium: 4.7 mmol/L (ref 3.5–5.1)
Sodium: 139 mmol/L (ref 135–145)
Total Bilirubin: 1.1 mg/dL (ref 0.3–1.2)
Total Protein: 6.7 g/dL (ref 6.5–8.1)

## 2020-03-20 LAB — CBC
HCT: 41.3 % (ref 36.0–46.0)
Hemoglobin: 13.4 g/dL (ref 12.0–15.0)
MCH: 29.6 pg (ref 26.0–34.0)
MCHC: 32.4 g/dL (ref 30.0–36.0)
MCV: 91.2 fL (ref 80.0–100.0)
Platelets: 214 10*3/uL (ref 150–400)
RBC: 4.53 MIL/uL (ref 3.87–5.11)
RDW: 13.3 % (ref 11.5–15.5)
WBC: 5.3 10*3/uL (ref 4.0–10.5)
nRBC: 0 % (ref 0.0–0.2)

## 2020-03-20 LAB — LIPASE, BLOOD: Lipase: 32 U/L (ref 11–51)

## 2020-03-20 MED ORDER — VALACYCLOVIR HCL 1 G PO TABS: 1000.0000 mg | ORAL_TABLET | Freq: Three times a day (TID) | ORAL | 0 refills | Status: AC

## 2020-03-20 MED ORDER — ACETAMINOPHEN 500 MG PO TABS
1000.0000 mg | ORAL_TABLET | Freq: Once | ORAL | Status: AC
  Administered 2020-03-20: 1000 mg via ORAL
  Filled 2020-03-20: qty 2

## 2020-03-20 MED ORDER — OXYCODONE HCL 5 MG PO TABS
5.0000 mg | ORAL_TABLET | Freq: Once | ORAL | Status: AC
  Administered 2020-03-20: 5 mg via ORAL
  Filled 2020-03-20: qty 1

## 2020-03-20 MED ORDER — OXYCODONE HCL 5 MG PO TABS: 5.0000 mg | ORAL_TABLET | Freq: Three times a day (TID) | ORAL | 0 refills | Status: AC | PRN

## 2020-03-20 NOTE — Discharge Instructions (Addendum)
Please seek medical attention for any high fevers, chest pain, shortness of breath, change in behavior, persistent vomiting, bloody stool or any other new or concerning symptoms.  

## 2020-03-20 NOTE — ED Triage Notes (Signed)
Pt here with abd pain that started Thurs. Pt states pain is on the RLQ and pain radiates to her back. Pt NAD in triage.

## 2020-03-20 NOTE — ED Provider Notes (Signed)
[facility] Emergency Department Provider Note  ____________________________________________   I have reviewed the triage vital signs and the nursing notes.   HISTORY  Chief Complaint Abdominal Pain   History limited by: Not Limited   HPI Sara Andrews is a 64 y.o. female who presents to the emergency department today because of concern for right sided abdominal and flank pain that started 5 days ago. The patient states that the pain started more towards the front of her abdomen and then started moving more towards her back. She describes it as a burning sensation. The patient denies similar pain in the past. She has not had any significant nausea or vomiting. No change in stool or urine. She denies any fevers.    Records reviewed. Per medical record review patient has a history of COPD.   Past Medical History:  Diagnosis Date  . Anxiety   . Arthritis    "hips, hands, back" (03/10/2016)  . Asthma   . Chronic bronchitis (HCC)   . COPD (chronic obstructive pulmonary disease) (HCC)   . Cough   . Depression   . Esophagitis, reflux   . GERD (gastroesophageal reflux disease)   . Hyperlipidemia   . Hypothyroidism   . IBS (irritable bowel syndrome)   . Lumbago   . Muscle pain   . Osteoarthritis   . Sinus disorder   . Thyroid disease   . Type 2 diabetes, diet controlled (HCC)   . Uncomplicated herpes simplex     Patient Active Problem List   Diagnosis Date Noted  . Centrilobular emphysema (HCC) 01/12/2020  . Senile purpura (HCC) 01/12/2020  . Major depression in remission (HCC) 01/12/2020  . Dyslipidemia associated with type 2 diabetes mellitus (HCC) 01/12/2020  . Bursitis of hip 08/23/2019  . Carpal tunnel syndrome 08/23/2019  . Thoracic aorta atherosclerosis (HCC) 08/03/2019  . Spondylosis of lumbar spine 11/06/2016  . Spinal stenosis of lumbar region with neurogenic claudication 10/28/2016  . Chronic bilateral low back pain with bilateral  sciatica 09/16/2016  . Hyperglycemia 08/07/2016  . Moderate episode of recurrent major depressive disorder (HCC) 08/07/2016  . Pain of left heel 06/02/2016  . Degenerative cervical disc 03/04/2016  . Gastroesophageal reflux disease without esophagitis 02/01/2016  . Primary osteoarthritis of right hand 02/01/2016  . Paresthesias in left hand 02/01/2016  . SOB (shortness of breath) 10/05/2013  . Asthma-COPD overlap syndrome (HCC) 10/05/2013  . History of smoking 10/05/2013  . Hyperlipidemia 10/05/2013  . Adult onset hypothyroidism 10/05/2013    Past Surgical History:  Procedure Laterality Date  . ABDOMINAL HYSTERECTOMY    . CARPAL TUNNEL RELEASE Right   . LUMBAR LAMINECTOMY  03/16/2018   L4-5 Laminectomy & Fusion w/ Pedicle Screws, TLIF & Allograft; Surgeon: Virl Diamond, MD; Location: [facility] MAIN OR; Service: Orthopedics; Laterality: N/A; Prone, ProAxis, Stim Neuromonitoring, O-Arm, Cell Saver, Bone Mill, Medtronic, Justin, PACS on [facility]    . OOPHORECTOMY    . ORIF TIBIA PLATEAU Left 03/10/2016   Procedure: OPEN REDUCTION INTERNAL FIXATION (ORIF) BICONDYLAR TIBIAL PLATEAU FRACTURE;  Surgeon: Eldred Manges, MD;  Location: [facility] OR;  Service: Orthopedics;  Laterality: Left;  . SHOULDER ARTHROSCOPY W/ ROTATOR CUFF REPAIR Right   . TONSILLECTOMY AND ADENOIDECTOMY      Prior to Admission medications   Medication Sig Start Date End Date Taking? Authorizing Provider  albuterol (PROVENTIL) (2.5 MG/3ML) 0.083% nebulizer solution Take 3 mLs (2.5 mg total) by nebulization every 6 (six) hours as needed for wheezing or shortness  of breath. 07/29/17   Alba Cory, MD  aspirin EC 81 MG tablet Take 1 tablet (81 mg total) by mouth daily. 08/30/19   Debbe Odea, MD  baclofen (LIORESAL) 10 MG tablet Take 1 tablet by mouth daily as needed. 09/09/18   Edward Jolly, MD  buPROPion (WELLBUTRIN XL) 150 MG 24 hr tablet Take 1 tablet (150 mg total) by mouth daily. 01/12/20   Alba Cory, MD   Cholecalciferol (VITAMIN D PO) Take 1 capsule by mouth daily.    [provider]  diclofenac Sodium (VOLTAREN) 1 % GEL Apply topically 4 (four) times daily.    [provider]  DULoxetine (CYMBALTA) 60 MG capsule Take 1 capsule (60 mg total) by mouth daily. 01/12/20   Alba Cory, MD  Fluticasone-Umeclidin-Vilant (TRELEGY ELLIPTA) 100-62.5-25 MCG/INH AEPB Inhale 1 puff into the lungs daily. 01/12/20   Alba Cory, MD  levothyroxine (SYNTHROID) 75 MCG tablet TAKE 1 TABLET(75 MCG) BY MOUTH DAILY 12/30/19   Alba Cory, MD  Magnesium Oxide 400 MG CAPS Take 1 capsule (400 mg total) by mouth once. 04/30/15   Dennison Mascot, MD  Melatonin 5 MG CAPS Take 1 capsule by mouth daily.    [provider]  meloxicam (MOBIC) 15 MG tablet TAKE 1 TABLET(15 MG) BY MOUTH DAILY 03/18/20   Alba Cory, MD  metoprolol tartrate (LOPRESSOR) 100 MG tablet Take 1 tablet (100 mg total) by mouth once for 1 dose. Take 2 hours prior to the CT. 08/30/19 08/30/19  Debbe Odea, MD  montelukast (SINGULAIR) 10 MG tablet Take 1 tablet (10 mg total) by mouth at bedtime. 01/12/20   Alba Cory, MD  omeprazole (PRILOSEC) 40 MG capsule TAKE ONE CAPSULE BY MOUTH DAILY 01/12/20   Carlynn Purl, Danna Hefty, MD  ondansetron (ZOFRAN) 4 MG tablet Take 1 tablet (4 mg total) by mouth every 8 (eight) hours as needed for nausea or vomiting. 08/23/19   Alba Cory, MD  PROAIR HFA 108 [phone] Base) MCG/ACT inhaler INHALE 2 PUFFS INTO THE LUNGS EVERY 6 HOURS AS NEEDED FOR WHEEZING OR SHORTNESS OF BREATH 02/23/18   Alba Cory, MD  rosuvastatin (CRESTOR) 20 MG tablet Take 1 tablet (20 mg total) by mouth daily. 01/12/20   Alba Cory, MD  SUMAtriptan (IMITREX) 50 MG tablet May repeat in 2 hours if headache persists or recurs. Do not take more than 3 in 24 hours. 01/12/20   Alba Cory, MD  traZODone (DESYREL) 100 MG tablet Take 1-1.5 tablets (100-150 mg total) by mouth at bedtime. 01/12/20   Alba Cory, MD     Allergies Patient has no known allergies.  Family History  Problem Relation Age of Onset  . Hypertension Daughter   . Breast cancer Daughter   . Diabetes Father   . Heart disease Father   . Breast cancer Maternal Aunt 60  . Breast cancer Paternal Aunt 21  . Depression Sister   . Alcohol abuse Sister   . Anxiety disorder Sister   . Bipolar disorder Sister   . Pneumonia Sister   . Skin cancer Daughter   . Anxiety disorder Daughter     Social History Social History   Tobacco Use  . Smoking status: Former Smoker    Packs/day: 2.00    Years: 30.00    Pack years: 60.00    Quit date: 09/03/2008    Years since quitting: 11.5  . Smokeless tobacco: Never Used  Vaping Use  . Vaping Use: Never used  Substance Use Topics  . Alcohol use: No  .  Drug use: No    Review of Systems Constitutional: No fever/chills Eyes: No visual changes. ENT: No sore throat. Cardiovascular: Denies chest pain. Respiratory: Denies shortness of breath. Gastrointestinal: Positive for right flank and abdominal pain. Genitourinary: Negative for dysuria. Musculoskeletal: Negative for back pain. Skin: Negative for rash. Neurological: Negative for headaches, focal weakness or numbness.  ____________________________________________   PHYSICAL EXAM:  VITAL SIGNS: ED Triage Vitals  Enc Vitals Group     BP 03/20/20 1844 137/75     Pulse Rate 03/20/20 1844 77     Resp 03/20/20 1844 16     Temp 03/20/20 1844 98.1 F (36.7 C)     Temp Source 03/20/20 1844 Oral     SpO2 03/20/20 1844 97 %     Weight 03/20/20 1845 158 lb (71.7 kg)     Height 03/20/20 1845 5\' 4"  (1.626 m)     Head Circumference --      Peak Flow --      Pain Score 03/20/20 1845 10     Pain Loc --      Pain Edu? --      Excl. in GC? --      Constitutional: Alert and oriented.  Eyes: Conjunctivae are normal.  ENT      Head: Normocephalic and atraumatic.      Nose: No congestion/rhinnorhea.      Mouth/Throat: Mucous  membranes are moist.      Neck: No stridor. Hematological/Lymphatic/Immunilogical: No cervical lymphadenopathy. Cardiovascular: Normal rate, regular rhythm.  No murmurs, rubs, or gallops.  Respiratory: Normal respiratory effort without tachypnea nor retractions. Breath sounds are clear and equal bilaterally. No wheezes/rales/rhonchi. Gastrointestinal: Soft and non tender. No rebound. No guarding.  Genitourinary: Deferred Musculoskeletal: Normal range of motion in all extremities. No lower extremity edema. Neurologic:  Normal speech and language. No gross focal neurologic deficits are appreciated.  Skin:  Rash to right flank and to the right of lumbar spine consistent with shingles. Psychiatric: Mood and affect are normal. Speech and behavior are normal. Patient exhibits appropriate insight and judgment.  ____________________________________________    LABS (pertinent positives/negatives)  Lipase 32 CMP wnl UA clear, not consistent with infection CBC wbc 5.3, hgb 13.4, plt 214  ____________________________________________   EKG  None  ____________________________________________    RADIOLOGY  CT abd/pel  No significant intraabdominal infection, possible bladder wall thickening  ____________________________________________   PROCEDURES  Procedures  ____________________________________________   INITIAL IMPRESSION / ASSESSMENT AND PLAN / ED COURSE  Pertinent labs & imaging results that were available during my care of the patient were reviewed by me and considered in my medical decision making (see chart for details).   Patient presented to the emergency department today because of concerns for right flank and abdominal pain. Exam is consistent with shingles. Discussed this with the patient. Will discharge with pain medication and antiviral.  ____________________________________________   FINAL CLINICAL IMPRESSION(S) / ED DIAGNOSES  Final diagnoses:  Herpes  zoster without complication     Note: This dictation was prepared with Dragon dictation. Any transcriptional errors that result from this process are unintentional     03/22/20, MD 03/20/20 2310

## 2020-03-21 ENCOUNTER — Other Ambulatory Visit: Payer: Self-pay | Admitting: Family Medicine

## 2020-03-21 DIAGNOSIS — F325 Major depressive disorder, single episode, in full remission: Secondary | ICD-10-CM

## 2020-03-22 DIAGNOSIS — R208 Other disturbances of skin sensation: Secondary | ICD-10-CM | POA: Insufficient documentation

## 2020-03-22 NOTE — Progress Notes (Signed)
Name: Sara Andrews   MRN: [id number]    DOB: [birth year]   Date:03/23/2020       Progress Note  Subjective  Chief Complaint  Chief Complaint  Patient presents with  . Herpes Zoster    I connected with  [name] on 03/23/20 at 11:40 AM EDT by telephone and verified that I am speaking with the correct person using two identifiers.  I discussed the limitations, risks, security and privacy concerns of performing an evaluation and management service by telephone and the availability of in person appointments. Staff also discussed with the patient that there may be a patient responsible charge related to this service. Patient Location: at home Provider Location: [facility]   HPI  Shingles: she developed flank pain about one week ago, blisters showed up on right flank , she went to [facility] and was diagnosed with shingles, she was given Valtrex and oxycodone, she states pain is still intense, we will add lyrica and prednisone today , answered questions  Patient Active Problem List   Diagnosis Date Noted  . Shingles 03/23/2020  . Fire ant bite 03/23/2020  . Burning sensation of lower extremity 03/22/2020  . Centrilobular emphysema (HCC) 01/12/2020  . Senile purpura (HCC) 01/12/2020  . Major depression in remission (HCC) 01/12/2020  . Dyslipidemia associated with type 2 diabetes mellitus (HCC) 01/12/2020  . Bursitis of hip 08/23/2019  . Carpal tunnel syndrome 08/23/2019  . Thoracic aorta atherosclerosis (HCC) 08/03/2019  . Spondylosis of lumbar spine 11/06/2016  . Spinal stenosis of lumbar region with neurogenic claudication 10/28/2016  . Chronic bilateral low back pain with bilateral sciatica 09/16/2016  . Hyperglycemia 08/07/2016  . Moderate episode of recurrent major depressive disorder (HCC) 08/07/2016  . Pain of left heel 06/02/2016  . Degenerative cervical disc 03/04/2016  . Gastroesophageal reflux disease without esophagitis 02/01/2016  . Primary osteoarthritis of  right hand 02/01/2016  . Paresthesias in left hand 02/01/2016  . SOB (shortness of breath) 10/05/2013  . Asthma-COPD overlap syndrome (HCC) 10/05/2013  . History of smoking 10/05/2013  . Hyperlipidemia 10/05/2013  . Adult onset hypothyroidism 10/05/2013    Past Surgical History:  Procedure Laterality Date  . ABDOMINAL HYSTERECTOMY    . CARPAL TUNNEL RELEASE Right   . LUMBAR LAMINECTOMY  03/16/2018   L4-5 Laminectomy & Fusion w/ Pedicle Screws, TLIF & Allograft; Surgeon: Virl Diamond, MD; Location: [facility] MAIN OR; Service: Orthopedics; Laterality: N/A; Prone, ProAxis, Stim Neuromonitoring, O-Arm, Cell Saver, Bone Mill, Medtronic, Justin, PACS on [facility]    . OOPHORECTOMY    . ORIF TIBIA PLATEAU Left 03/10/2016   Procedure: OPEN REDUCTION INTERNAL FIXATION (ORIF) BICONDYLAR TIBIAL PLATEAU FRACTURE;  Surgeon: Eldred Manges, MD;  Location: [facility] OR;  Service: Orthopedics;  Laterality: Left;  . SHOULDER ARTHROSCOPY W/ ROTATOR CUFF REPAIR Right   . TONSILLECTOMY AND ADENOIDECTOMY      Family History  Problem Relation Age of Onset  . Hypertension Daughter   . Breast cancer Daughter   . Diabetes Father   . Heart disease Father   . Breast cancer Maternal Aunt 60  . Breast cancer Paternal Aunt 84  . Depression Sister   . Alcohol abuse Sister   . Anxiety disorder Sister   . Bipolar disorder Sister   . Pneumonia Sister   . Skin cancer Daughter   . Anxiety disorder Daughter     Social History   Socioeconomic History  . Marital status: Widowed    Spouse name: Not on file  .  Number of children: 3  . Years of education: Not on file  . Highest education level: 10th grade  Occupational History  . Occupation: disabled    Comment: retired  Tobacco Use  . Smoking status: Former Smoker    Packs/day: 2.00    Years: 30.00    Pack years: 60.00    Quit date: 09/03/2008    Years since quitting: 11.5  . Smokeless tobacco: Never Used  Vaping Use  . Vaping Use: Never used  Substance and Sexual  Activity  . Alcohol use: No  . Drug use: No  . Sexual activity: Never  Other Topics Concern  . Not on file  Social History Narrative   She used to work at  [employer], but had a left knee work related injury 03/10/2016 , require left knee surgery and is now having back pain, that is getting evaluated, Out of work since.       Patient recently found out that one of her twins [name]) was diagnosed with breast cancer); will be having a double mastectomy soon.      Social Determinants of Health   Financial Resource Strain: Low Risk   . Difficulty of Paying Living Expenses: Not hard at all  Food Insecurity: No Food Insecurity  . Worried About Programme researcher, broadcasting/film/video in the Last Year: Never true  . Ran Out of Food in the Last Year: Never true  Transportation Needs: No Transportation Needs  . Lack of Transportation (Medical): No  . Lack of Transportation (Non-Medical): No  Physical Activity: Sufficiently Active  . Days of Exercise per Week: 7 days  . Minutes of Exercise per Session: 40 min  Stress: No Stress Concern Present  . Feeling of Stress : Not at all  Social Connections: Socially Integrated  . Frequency of Communication with Friends and Family: More than three times a week  . Frequency of Social Gatherings with Friends and Family: More than three times a week  . Attends Religious Services: More than 4 times per year  . Active Member of Clubs or Organizations: Yes  . Attends Banker Meetings: More than 4 times per year  . Marital Status: Married  Catering manager Violence: Not At Risk  . Fear of Current or Ex-Partner: No  . Emotionally Abused: No  . Physically Abused: No  . Sexually Abused: No     Current Outpatient Medications:  .  aspirin EC 81 MG tablet, Take 1 tablet (81 mg total) by mouth daily., Disp: 90 tablet, Rfl: 3 .  baclofen (LIORESAL) 10 MG tablet, Take 1 tablet by mouth daily as needed., Disp: , Rfl:  .  buPROPion (WELLBUTRIN XL) 150 MG 24 hr  tablet, Take 1 tablet (150 mg total) by mouth daily., Disp: 90 tablet, Rfl: 1 .  Cholecalciferol (VITAMIN D PO), Take 1 capsule by mouth daily., Disp: , Rfl:  .  diclofenac Sodium (VOLTAREN) 1 % GEL, Apply topically 4 (four) times daily., Disp: , Rfl:  .  DULoxetine (CYMBALTA) 60 MG capsule, Take 1 capsule (60 mg total) by mouth daily., Disp: 90 capsule, Rfl: 1 .  Fluticasone-Umeclidin-Vilant (TRELEGY ELLIPTA) 100-62.5-25 MCG/INH AEPB, Inhale 1 puff into the lungs daily., Disp: 60 each, Rfl: 2 .  levothyroxine (SYNTHROID) 75 MCG tablet, TAKE 1 TABLET(75 MCG) BY MOUTH DAILY, Disp: 90 tablet, Rfl: 0 .  Magnesium Oxide 400 MG CAPS, Take 1 capsule (400 mg total) by mouth once., Disp: 30 capsule, Rfl: 5 .  Melatonin 5 MG CAPS, Take  1 capsule by mouth daily., Disp: , Rfl:  .  meloxicam (MOBIC) 15 MG tablet, TAKE 1 TABLET(15 MG) BY MOUTH DAILY, Disp: 30 tablet, Rfl: 0 .  montelukast (SINGULAIR) 10 MG tablet, Take 1 tablet (10 mg total) by mouth at bedtime., Disp: 90 tablet, Rfl: 1 .  omeprazole (PRILOSEC) 40 MG capsule, TAKE ONE CAPSULE BY MOUTH DAILY, Disp: 90 capsule, Rfl: 0 .  ondansetron (ZOFRAN) 4 MG tablet, Take 1 tablet (4 mg total) by mouth every 8 (eight) hours as needed for nausea or vomiting., Disp: 20 tablet, Rfl: 0 .  oxyCODONE (ROXICODONE) 5 MG immediate release tablet, Take 1 tablet (5 mg total) by mouth every 8 (eight) hours as needed for up to 5 days., Disp: 15 tablet, Rfl: 0 .  rosuvastatin (CRESTOR) 20 MG tablet, Take 1 tablet (20 mg total) by mouth daily., Disp: 90 tablet, Rfl: 1 .  SUMAtriptan (IMITREX) 50 MG tablet, May repeat in 2 hours if headache persists or recurs. Do not take more than 3 in 24 hours., Disp: 10 tablet, Rfl: 0 .  traMADol (ULTRAM) 50 MG tablet, Take 50 mg by mouth every 8 (eight) hours as needed., Disp: , Rfl:  .  traZODone (DESYREL) 100 MG tablet, Take 1-1.5 tablets (100-150 mg total) by mouth at bedtime., Disp: 100 tablet, Rfl: 1 .  valACYclovir (VALTREX) 1000 MG  tablet, Take 1 tablet (1,000 mg total) by mouth 3 (three) times daily for 7 days., Disp: 21 tablet, Rfl: 0 .  metoprolol tartrate (LOPRESSOR) 100 MG tablet, Take 1 tablet (100 mg total) by mouth once for 1 dose. Take 2 hours prior to the CT., Disp: 1 tablet, Rfl: 0  No Known Allergies  I personally reviewed active problem list, medication list, allergies, family history, social history, health maintenance with the patient/caregiver today.   ROS  Ten systems reviewed and is negative except as mentioned in HPI   Objective  Virtual encounter, vitals not obtained.  Body mass index is 34.19 kg/m.  Physical Exam   Awake, alert and oriented   Results for orders placed or performed during the hospital encounter of 03/20/20 (from the past 72 hour(s))  Lipase, blood     Status: None   Collection Time: 03/20/20  6:48 PM  Result Value Ref Range   Lipase 32 11 - 51 U/L    Comment: Performed at [facility], [address] [place], [place] [address]  Comprehensive metabolic panel     Status: None   Collection Time: 03/20/20  6:48 PM  Result Value Ref Range   Sodium 139 135 - 145 mmol/L   Potassium 4.7 3.5 - 5.1 mmol/L   Chloride 102 98 - 111 mmol/L   CO2 28 22 - 32 mmol/L   Glucose, Bld 91 70 - 99 mg/dL    Comment: Glucose reference range applies only to samples taken after fasting for at least 8 hours.   BUN 13 8 - 23 mg/dL   Creatinine, Ser 2.950.65 0.44 - 1.00 mg/dL   Calcium 9.2 8.9 - 62.110.3 mg/dL   Total Protein 6.7 6.5 - 8.1 g/dL   Albumin 4.2 3.5 - 5.0 g/dL   AST 31 15 - 41 U/L   ALT 37 0 - 44 U/L   Alkaline Phosphatase 75 38 - 126 U/L   Total Bilirubin 1.1 0.3 - 1.2 mg/dL   GFR calc non Af Amer >60 >60 mL/min   GFR calc Af Amer >60 >60 mL/min   Anion gap 9 5 -  15    Comment: Performed at [facility], [address] [place], [place] [address]  CBC     Status: None   Collection Time: 03/20/20  6:48 PM  Result Value Ref Range   WBC 5.3 4.0 - 10.5 K/uL    RBC 4.53 3.87 - 5.11 MIL/uL   Hemoglobin 13.4 12.0 - 15.0 g/dL   HCT 95.1 36 - 46 %   MCV 91.2 80.0 - 100.0 fL   MCH 29.6 26.0 - 34.0 pg   MCHC 32.4 30.0 - 36.0 g/dL   RDW 88.4 16.6 - 06.3 %   Platelets 214 150 - 400 K/uL   nRBC 0.0 0.0 - 0.2 %    Comment: Performed at [facility], [address] [place], [place] [address]  Urinalysis, Complete w Microscopic     Status: Abnormal   Collection Time: 03/20/20  6:48 PM  Result Value Ref Range   Color, Urine STRAW (A) YELLOW   APPearance CLEAR (A) CLEAR   Specific Gravity, Urine 1.004 (L) 1.005 - 1.030   pH 8.0 5.0 - 8.0   Glucose, UA NEGATIVE NEGATIVE mg/dL   Hgb urine dipstick NEGATIVE NEGATIVE   Bilirubin Urine NEGATIVE NEGATIVE   Ketones, ur NEGATIVE NEGATIVE mg/dL   Protein, ur NEGATIVE NEGATIVE mg/dL   Nitrite NEGATIVE NEGATIVE   Leukocytes,Ua NEGATIVE NEGATIVE   RBC / HPF 0-5 0 - 5 RBC/hpf   WBC, UA 0-5 0 - 5 WBC/hpf   Bacteria, UA NONE SEEN NONE SEEN   Squamous Epithelial / LPF NONE SEEN 0 - 5    Comment: Performed at [facility], [address] [place], [place] [address]    PHQ2/9: Depression screen [facility] 2/9 03/23/2020 01/12/2020 08/23/2019 07/12/2019 04/14/2019  Decreased Interest 0 0 3 0 0  Down, Depressed, Hopeless 0 0 0 0 0  PHQ - 2 Score 0 0 3 0 0  Altered sleeping 0 1 1 0 0  Tired, decreased energy 1 1 0 0 0  Change in appetite 0 1 0 0 0  Feeling bad or failure about yourself  0 0 0 0 0  Trouble concentrating 0 0 0 0 0  Moving slowly or fidgety/restless 0 0 0 0 0  Suicidal thoughts 0 0 0 0 0  PHQ-9 Score 1 3 4  0 0  Difficult doing work/chores Not difficult at all Not difficult at all Not difficult at all - Not difficult at all  Some recent data might be hidden   PHQ-2/9 Result is negative.    Fall Risk: Fall Risk  03/23/2020 01/12/2020 08/23/2019 07/12/2019 04/21/2019  Falls in the past year? 0 0 0 0 0  Number falls in past yr: 0 0 0 0 -  Injury with Fall? 0 0 0 0 -  Comment - - - - -   Follow up - - - - -     Assessment & Plan  1. Herpes zoster without complication  - predniSONE (DELTASONE) 10 MG tablet; Take 1 tablet (10 mg total) by mouth daily with breakfast.  Dispense: 10 tablet; Refill: 0 - pregabalin (LYRICA) 50 MG capsule; Take 1 capsule (50 mg total) by mouth 3 (three) times daily.  Dispense: 90 capsule; Refill: 0  2. Fire ant bite, accidental or unintentional, subsequent encounter  - triamcinolone cream (KENALOG) 0.1 %; Apply 1 application topically 2 (two) times daily.  Dispense: 30 g; Refill: 0 I discussed the assessment and treatment plan with the patient. The patient was provided  an opportunity to ask questions and all were answered. The patient agreed with the plan and demonstrated an understanding of the instructions.   The patient was advised to call back or seek an in-person evaluation if the symptoms worsen or if the condition fails to improve as anticipated.  I provided 15  minutes of non-face-to-face time during this encounter.  Ruel Favors, MD

## 2020-03-23 ENCOUNTER — Encounter: Payer: Self-pay | Admitting: Family Medicine

## 2020-03-23 ENCOUNTER — Ambulatory Visit (INDEPENDENT_AMBULATORY_CARE_PROVIDER_SITE_OTHER): Payer: Medicare Other | Admitting: Family Medicine

## 2020-03-23 VITALS — Ht <= 58 in | Wt 158.0 lb

## 2020-03-23 DIAGNOSIS — T63421A Toxic effect of venom of ants, accidental (unintentional), initial encounter: Secondary | ICD-10-CM | POA: Insufficient documentation

## 2020-03-23 DIAGNOSIS — Z8619 Personal history of other infectious and parasitic diseases: Secondary | ICD-10-CM | POA: Insufficient documentation

## 2020-03-23 DIAGNOSIS — T63421D Toxic effect of venom of ants, accidental (unintentional), subsequent encounter: Secondary | ICD-10-CM

## 2020-03-23 DIAGNOSIS — B029 Zoster without complications: Secondary | ICD-10-CM | POA: Diagnosis not present

## 2020-03-23 MED ORDER — TRIAMCINOLONE ACETONIDE 0.1 % EX CREA: 1.0000 "application " | TOPICAL_CREAM | Freq: Two times a day (BID) | CUTANEOUS | 0 refills | Status: DC

## 2020-03-23 MED ORDER — PREDNISONE 10 MG PO TABS: 10.0000 mg | ORAL_TABLET | Freq: Every day | ORAL | 0 refills | Status: DC

## 2020-03-23 MED ORDER — PREGABALIN 50 MG PO CAPS: 50.0000 mg | ORAL_CAPSULE | Freq: Three times a day (TID) | ORAL | 0 refills | Status: DC

## 2020-03-23 NOTE — Patient Instructions (Signed)
Insect Bite, Adult An insect bite can make your skin red, itchy, and swollen. Some insects can spread disease to people with a bite. However, most insect bites do not lead to disease, and most are not serious. What are the causes? Insects may bite for many reasons, including:  Hunger.  To defend themselves. Insects that bite include:  Spiders.  Mosquitoes.  Ticks.  Fleas.  Ants.  Flies.  Kissing bugs.  Chiggers. What are the signs or symptoms? Symptoms of this condition include:  Itching or pain in the bite area.  Redness and swelling in the bite area.  An open wound (skin ulcer). Symptoms often last for 2-4 days. In rare cases, a person may have a very bad allergic reaction (anaphylactic reaction) to a bite. Symptoms of an anaphylactic reaction may include:  Feeling warm in the face (flushed). Your face may turn red.  Itchy, red, swollen areas of skin (hives).  Swelling of the: ? Eyes. ? Lips. ? Face. ? Mouth. ? Tongue. ? Throat.  Trouble with any of these: ? Breathing. ? Talking. ? Swallowing.  Loud breathing (wheezing).  Feeling dizzy or light-headed.  Passing out (fainting).  Pain or cramps in your belly.  Throwing up (vomiting).  Watery poop (diarrhea). How is this treated? Treatment is usually not needed. Symptoms often go away on their own. When treatment is needed, it may involve:  Putting a cream or lotion on the bite area. This helps with itching.  Taking an antibiotic medicine. This treatment is needed if the bite area gets infected.  Getting a tetanus shot, if you are not up to date on this vaccine.  Putting ice on the affected area.  Using medicines called antihistamines. This treatment may be needed if you have itching or an allergic reaction to the insect bite.  Giving yourself a shot of medicine (epinephrine) using an auto-injector "pen" if you have an anaphylactic reaction to a bite. Your doctor will teach you how to use  this pen. Follow these instructions at home: Bite area care   Do not scratch the bite area.  Keep the bite area clean and dry.  Wash the bite area every day with soap and water as told by your doctor.  Check the bite area every day for signs of infection. Check for: ? Redness, swelling, or pain. ? Fluid or blood. ? Warmth. ? Pus or a bad smell. Managing pain, itching, and swelling   You may put any of these on the bite area as told by your doctor: ? A paste made of baking soda and water. ? Cortisone cream. ? Calamine lotion.  If told, put ice on the bite area. ? Put ice in a plastic bag. ? Place a towel between your skin and the bag. ? Leave the ice on for 20 minutes, 2-3 times a day. General instructions  Apply or take over-the-counter and prescription medicines only as told by your doctor.  If you were prescribed an antibiotic medicine, take or apply it as told by your doctor. Do not stop using the antibiotic even if your condition improves.  Keep all follow-up visits as told by your doctor. This is important. How is this prevented? To help you have a lower risk of insect bites:  When you are outside, wear clothing that covers your arms and legs.  Use insect repellent. The best insect repellents contain one of these: ? DEET. ? Picaridin. ? Oil of lemon eucalyptus (OLE). ? IR3535.  Consider spraying your   clothing with a pesticide called permethrin. Permethrin helps prevent insect bites. It works for several weeks and for up to 5-6 clothing washes. Do not apply permethrin directly to the skin.  If your home windows do not have screens, think about putting some in.  If you will be sleeping in an area where there are mosquitoes, consider covering your sleeping area with a mosquito net. Contact a doctor if:  You have redness, swelling, or pain in the bite area.  You have fluid or blood coming from the bite area.  The bite area feels warm to the touch.  You have  pus or a bad smell coming from the bite area.  You have a fever. Get help right away if:  You have joint pain.  You have a rash.  You feel more tired or sleepy than you normally do.  You have neck pain.  You have a headache.  You feel weaker than you normally do.  You have signs of an anaphylactic reaction. Signs may include: ? Feeling warm in the face. ? Itchy, red, swollen areas of skin. ? Swelling of your:  Eyes.  Lips.  Face.  Mouth.  Tongue.  Throat. ? Trouble with any of these:  Breathing.  Talking.  Swallowing. ? Loud breathing. ? Feeling dizzy or light-headed. ? Passing out. ? Pain or cramps in your belly. ? Throwing up. ? Watery poop. These symptoms may be an emergency. Do not wait to see if the symptoms will go away. Do this right away:  Use your auto-injector pen as you have been told.  Get medical help. Call your local emergency services (911 in the [place] Do not drive yourself to the hospital. Summary  An insect bite can make your skin red, itchy, and swollen.  Treatment is usually not needed. Symptoms often go away on their own.  Do not scratch the bite area. Keep it clean and dry.  Ice can help with pain and itching from the bite. This information is not intended to replace advice given to you by your health care provider. Make sure you discuss any questions you have with your health care provider. Document Revised: 01/22/2018 Document Reviewed: 01/22/2018 Elsevier Patient Education  2020 [employer] Shingles  Shingles is an infection. It gives you a painful skin rash and blisters that have fluid in them. Shingles is caused by the same germ (virus) that causes chickenpox. Shingles only happens in people who:  Have had chickenpox.  Have been given a shot of medicine (vaccine) to protect against chickenpox. Shingles is rare in this group. The first symptoms of shingles may be itching, tingling, or pain in an area on your skin. A  rash will show on your skin a few days or weeks later. The rash is likely to be on one side of your body. The rash usually has a shape like a belt or a band. Over time, the rash turns into fluid-filled blisters. The blisters will break open, change into scabs, and dry up. Medicines may:  Help with pain and itching.  Help you get better sooner.  Help to prevent long-term problems. Follow these instructions at home: Medicines  Take over-the-counter and prescription medicines only as told by your doctor.  Put on an anti-itch cream or numbing cream where you have a rash, blisters, or scabs. Do this as told by your doctor. Helping with itching and discomfort   Put cold, wet cloths (cold compresses) on the area of the rash or blisters  as told by your doctor.  Cool baths can help you feel better. Try adding baking soda or dry oatmeal to the water to lessen itching. Do not bathe in hot water. Blister and rash care  Keep your rash covered with a loose bandage (dressing).  Wear loose clothing that does not rub on your rash.  Keep your rash and blisters clean. To do this, wash the area with mild soap and cool water as told by your doctor.  Check your rash every day for signs of infection. Check for: ? More redness, swelling, or pain. ? Fluid or blood. ? Warmth. ? Pus or a bad smell.  Do not scratch your rash. Do not pick at your blisters. To help you to not scratch: ? Keep your fingernails clean and cut short. ? Wear gloves or mittens when you sleep, if scratching is a problem. General instructions  Rest as told by your doctor.  Keep all follow-up visits as told by your doctor. This is important.  Wash your hands often with soap and water. If soap and water are not available, use hand sanitizer. Doing this lowers your chance of getting a skin infection caused by germs (bacteria).  Your infection can cause chickenpox in people who have never had chickenpox or never got a shot of  chickenpox vaccine. If you have blisters that did not change into scabs yet, try not to touch other people or be around other people, especially: ? Babies. ? Pregnant women. ? Children who have areas of red, itchy, or rough skin (eczema). ? Very old people who have transplants. ? People who have a long-term (chronic) sickness, like cancer or AIDS. Contact a doctor if:  Your pain does not get better with medicine.  Your pain does not get better after the rash heals.  You have any signs of infection in the rash area. These signs include: ? More redness, swelling, or pain around the rash. ? Fluid or blood coming from the rash. ? The rash area feeling warm to the touch. ? Pus or a bad smell coming from the rash. Get help right away if:  The rash is on your face or nose.  You have pain in your face or pain by your eye.  You lose feeling on one side of your face.  You have trouble seeing.  You have ear pain, or you have ringing in your ear.  You have a loss of taste.  Your condition gets worse. Summary  Shingles gives you a painful skin rash and blisters that have fluid in them.  Shingles is an infection. It is caused by the same germ (virus) that causes chickenpox.  Keep your rash covered with a loose bandage (dressing). Wear loose clothing that does not rub on your rash.  If you have blisters that did not change into scabs yet, try not to touch other people or be around people. This information is not intended to replace advice given to you by your health care provider. Make sure you discuss any questions you have with your health care provider. Document Revised: 11/05/2018 Document Reviewed: 03/18/2017 Elsevier Patient Education  2020 [employer].

## 2020-03-26 ENCOUNTER — Other Ambulatory Visit: Payer: Self-pay | Admitting: Family Medicine

## 2020-03-26 DIAGNOSIS — E038 Other specified hypothyroidism: Secondary | ICD-10-CM

## 2020-03-27 ENCOUNTER — Other Ambulatory Visit: Payer: Self-pay | Admitting: Family Medicine

## 2020-03-27 ENCOUNTER — Telehealth: Payer: Self-pay | Admitting: Family Medicine

## 2020-03-27 DIAGNOSIS — B029 Zoster without complications: Secondary | ICD-10-CM

## 2020-03-27 MED ORDER — LIDOCAINE 5 % EX OINT: 1.0000 "application " | TOPICAL_OINTMENT | CUTANEOUS | 0 refills | Status: DC | PRN

## 2020-03-27 NOTE — Telephone Encounter (Signed)
Patient called to ask if the doctor has sent in the patches for her shingles.  She is in a lot of pain and she said the doctor was going to send in the script for the patches.  Please advise.

## 2020-03-29 NOTE — Telephone Encounter (Signed)
Pt stated that her insurance has questions about why she needs lidocaine (XYLOCAINE) 5 % ointment  /Pt stated her insurance advised to had PCP contact them   @ [phone]/ please advise   Her insurance also told the pt that Dr. Carlynn Purl was not her PCP/ Pt asked if you can please let them know that Dr. Carlynn Purl is her pcp

## 2020-03-29 NOTE — Telephone Encounter (Signed)
Pt notified, we are working on approval, just faxed more info this am to try to get approved

## 2020-04-05 ENCOUNTER — Ambulatory Visit: Payer: Medicare Other | Admitting: Cardiology

## 2020-04-05 NOTE — Telephone Encounter (Addendum)
Pt is calling and the pharm does not have lidocaine in stock and pt would like something else call to [facility] in [place] [address]

## 2020-04-25 ENCOUNTER — Other Ambulatory Visit: Payer: Self-pay | Admitting: Family Medicine

## 2020-04-25 DIAGNOSIS — F325 Major depressive disorder, single episode, in full remission: Secondary | ICD-10-CM

## 2020-04-25 MED ORDER — DULOXETINE HCL 60 MG PO CPEP: 60.0000 mg | ORAL_CAPSULE | Freq: Every day | ORAL | 0 refills | Status: DC

## 2020-05-03 ENCOUNTER — Ambulatory Visit: Payer: Medicare Other | Admitting: Cardiology

## 2020-05-07 ENCOUNTER — Encounter: Payer: Self-pay | Admitting: Cardiology

## 2020-05-07 ENCOUNTER — Other Ambulatory Visit: Payer: Self-pay

## 2020-05-07 ENCOUNTER — Ambulatory Visit (INDEPENDENT_AMBULATORY_CARE_PROVIDER_SITE_OTHER): Payer: Medicare Other | Admitting: Cardiology

## 2020-05-07 VITALS — BP 110/72 | HR 70 | Ht <= 58 in | Wt 156.1 lb

## 2020-05-07 DIAGNOSIS — I2510 Atherosclerotic heart disease of native coronary artery without angina pectoris: Secondary | ICD-10-CM

## 2020-05-07 DIAGNOSIS — E7800 Pure hypercholesterolemia, unspecified: Secondary | ICD-10-CM | POA: Diagnosis not present

## 2020-05-07 NOTE — Patient Instructions (Signed)
Medication Instructions:   Your physician recommends that you continue on your current medications as directed. Please refer to the Current Medication list given to you today.   *If you need a refill on your cardiac medications before your next appointment, please call your pharmacy*   Lab Work:  Your physician recommends that you return for a FASTING lipid profile:  Just prior to your 1 year follow up.  - You will need to be fasting. Please do not have anything to eat or drink after midnight the morning you have the lab work. You may only have water or black coffee with no cream or sugar. - Please go to the [facility]. You will check in at the front desk to the right as you walk into the atrium. Valet Parking is offered if needed. - No appointment needed. You may go any day between 7 am and 6 pm.   Testing/Procedures: None Ordered   Follow-Up: At [facility], you and your health needs are our priority.  As part of our continuing mission to provide you with exceptional heart care, we have created designated Provider Care Teams.  These Care Teams include your primary Cardiologist (physician) and Advanced Practice Providers (APPs -  Physician Assistants and Nurse Practitioners) who all work together to provide you with the care you need, when you need it.  We recommend signing up for the patient portal called "MyChart".  Sign up information is provided on this After Visit Summary.  MyChart is used to connect with patients for Virtual Visits (Telemedicine).  Patients are able to view lab/test results, encounter notes, upcoming appointments, etc.  Non-urgent messages can be sent to your provider as well.   To learn more about what you can do with MyChart, go to [email or web address].    Your next appointment:   1 year(s)  The format for your next appointment:   In Person  Provider:   Debbe Odea, MD   Other Instructions

## 2020-05-07 NOTE — Progress Notes (Signed)
Cardiology Office Note:    Date:  05/07/2020   ID:  Sara Andrews, DOB 05-07-1956, MRN [id number]  PCP:  Sara Cory, MD  Cardiologist:  Sara Odea, MD  Electrophysiologist:  None   Referring MD: Sara Cory, MD   Chief Complaint  Patient presents with  . other    6 month follow up. Meds reviewed by the pt. verbally. Pt. c/o chest pain that comes and goes but mostly due to stressful situations.     History of Present Illness:    Sara Andrews is a 64 y.o. female with a hx of CAD (CCTA 09/2019-Ca score 185, 50% LCx, FFRct no stenosis), diabetes, hyperlipidemia, COPD, former smoker x30 years who presents for follow-up.    She is being followed due to coronary artery disease and hyperlipidemia.  She was recently admitted to the hospital due to shingles.  She states feeling well otherwise.  She tries to be active at home by working in her yard and doing other activities.  She denies chest pain or shortness of breath.  Takes aspirin 81 mg daily and Lipitor as prescribed.  Prior notes Echocardiogram 08/2019 normal systolic and diastolic function, EF 60%, and coronary CTA 09/2019 with calcium score 185, mild to moderate left circumflex stenosis, 50%, FFR CT no significant stenosis.  Past Medical History:  Diagnosis Date  . Anxiety   . Arthritis    "hips, hands, back" (03/10/2016)  . Asthma   . Chronic bronchitis (HCC)   . COPD (chronic obstructive pulmonary disease) (HCC)   . Cough   . Depression   . Esophagitis, reflux   . GERD (gastroesophageal reflux disease)   . Hyperlipidemia   . Hypothyroidism   . IBS (irritable bowel syndrome)   . Lumbago   . Muscle pain   . Osteoarthritis   . Sinus disorder   . Thyroid disease   . Type 2 diabetes, diet controlled (HCC)   . Uncomplicated herpes simplex     Past Surgical History:  Procedure Laterality Date  . ABDOMINAL HYSTERECTOMY    . CARPAL TUNNEL RELEASE Right   . LUMBAR LAMINECTOMY  03/16/2018   L4-5  Laminectomy & Fusion w/ Pedicle Screws, TLIF & Allograft; Surgeon: Virl Diamond, MD; Location: [facility] MAIN OR; Service: Orthopedics; Laterality: N/A; Prone, ProAxis, Stim Neuromonitoring, O-Arm, Cell Saver, [place], [employer], Justin, PACS on [facility]    . OOPHORECTOMY    . ORIF TIBIA PLATEAU Left 03/10/2016   Procedure: OPEN REDUCTION INTERNAL FIXATION (ORIF) BICONDYLAR TIBIAL PLATEAU FRACTURE;  Surgeon: Eldred Manges, MD;  Location: [facility] OR;  Service: Orthopedics;  Laterality: Left;  . SHOULDER ARTHROSCOPY W/ ROTATOR CUFF REPAIR Right   . TONSILLECTOMY AND ADENOIDECTOMY      Current Medications: Current Meds  Medication Sig  . aspirin EC 81 MG tablet Take 1 tablet (81 mg total) by mouth daily.  . baclofen (LIORESAL) 10 MG tablet Take 1 tablet by mouth daily as needed.  Marland Kitchen buPROPion (WELLBUTRIN XL) 150 MG 24 hr tablet Take 1 tablet (150 mg total) by mouth daily.  . Cholecalciferol (VITAMIN D PO) Take 1 capsule by mouth daily.  . diclofenac Sodium (VOLTAREN) 1 % GEL Apply topically 4 (four) times daily.  . DULoxetine (CYMBALTA) 60 MG capsule Take 1 capsule (60 mg total) by mouth daily.  . Fluticasone-Umeclidin-Vilant (TRELEGY ELLIPTA) 100-62.5-25 MCG/INH AEPB Inhale 1 puff into the lungs daily.  Marland Kitchen levothyroxine (SYNTHROID) 75 MCG tablet TAKE 1 TABLET(75 MCG) BY MOUTH DAILY  . lidocaine (XYLOCAINE)  5 % ointment Apply 1 application topically as needed.  . Magnesium Oxide 400 MG CAPS Take 1 capsule (400 mg total) by mouth once.  . Melatonin 5 MG CAPS Take 1 capsule by mouth daily.  . meloxicam (MOBIC) 15 MG tablet TAKE 1 TABLET(15 MG) BY MOUTH DAILY  . montelukast (SINGULAIR) 10 MG tablet Take 1 tablet (10 mg total) by mouth at bedtime.  Marland Kitchen omeprazole (PRILOSEC) 40 MG capsule TAKE ONE CAPSULE BY MOUTH DAILY  . ondansetron (ZOFRAN) 4 MG tablet Take 1 tablet (4 mg total) by mouth every 8 (eight) hours as needed for nausea or vomiting.  . predniSONE (DELTASONE) 10 MG tablet Take 1 tablet (10 mg total)  by mouth daily with breakfast.  . pregabalin (LYRICA) 50 MG capsule Take 1 capsule (50 mg total) by mouth 3 (three) times daily.  . rosuvastatin (CRESTOR) 20 MG tablet Take 1 tablet (20 mg total) by mouth daily.  . SUMAtriptan (IMITREX) 50 MG tablet May repeat in 2 hours if headache persists or recurs. Do not take more than 3 in 24 hours.  . traMADol (ULTRAM) 50 MG tablet Take 50 mg by mouth every 8 (eight) hours as needed.  . traZODone (DESYREL) 100 MG tablet Take 1-1.5 tablets (100-150 mg total) by mouth at bedtime.  . triamcinolone cream (KENALOG) 0.1 % Apply 1 application topically 2 (two) times daily.     Allergies:   Patient has no known allergies.   Social History   Socioeconomic History  . Marital status: Widowed    Spouse name: Not on file  . Number of children: 3  . Years of education: Not on file  . Highest education level: 10th grade  Occupational History  . Occupation: disabled    Comment: retired  Tobacco Use  . Smoking status: Former Smoker    Packs/day: 2.00    Years: 30.00    Pack years: 60.00    Quit date: 09/03/2008    Years since quitting: 11.6  . Smokeless tobacco: Never Used  Vaping Use  . Vaping Use: Never used  Substance and Sexual Activity  . Alcohol use: No  . Drug use: No  . Sexual activity: Never  Other Topics Concern  . Not on file  Social History Narrative   She used to work at  [employer], but had a left knee work related injury 03/10/2016 , require left knee surgery and is now having back pain, that is getting evaluated, Out of work since.       Patient recently found out that one of her twins Sara Andrews) was diagnosed with breast cancer); will be having a double mastectomy soon.      Social Determinants of Health   Financial Resource Strain: Low Risk   . Difficulty of Paying Living Expenses: Not hard at all  Food Insecurity: No Food Insecurity  . Worried About Programme researcher, broadcasting/film/video in the Last Year: Never true  . Ran Out of Food in the  Last Year: Never true  Transportation Needs: No Transportation Needs  . Lack of Transportation (Medical): No  . Lack of Transportation (Non-Medical): No  Physical Activity: Sufficiently Active  . Days of Exercise per Week: 7 days  . Minutes of Exercise per Session: 40 min  Stress: No Stress Concern Present  . Feeling of Stress : Not at all  Social Connections: Socially Integrated  . Frequency of Communication with Friends and Family: More than three times a week  . Frequency of Social Gatherings with Friends  and Family: More than three times a week  . Attends Religious Services: More than 4 times per year  . Active Member of Clubs or Organizations: Yes  . Attends BankerClub or Organization Meetings: More than 4 times per year  . Marital Status: Married     Family History: The patient's family history includes Alcohol abuse in her sister; Anxiety disorder in her daughter and sister; Bipolar disorder in her sister; Breast cancer in her daughter; Breast cancer (age of onset: 4160) in her maternal aunt and paternal aunt; Depression in her sister; Diabetes in her father; Heart disease in her father; Hypertension in her daughter; Pneumonia in her sister; Skin cancer in her daughter.  ROS:   Please see the history of present illness.     All other systems reviewed and are negative.  EKGs/Labs/Other Studies Reviewed:    The following studies were reviewed today:   EKG:  EKG is  ordered today.  The ekg ordered today demonstrates normal sinus rhythm, normal ECG  Recent Labs: 01/12/2020: TSH 1.03 03/20/2020: ALT 37; BUN 13; Creatinine, Ser 0.65; Hemoglobin 13.4; Platelets 214; Potassium 4.7; Sodium 139  Recent Lipid Panel    Component Value Date/Time   CHOL 105 03/11/2019 0918   TRIG 49 03/11/2019 0918   HDL 60 03/11/2019 0918   CHOLHDL 1.8 03/11/2019 0918   CHOLHDL 2.0 02/03/2018 1146   LDLCALC 35 03/11/2019 0918   LDLCALC 39 02/03/2018 1146    Physical Exam:    VS:  BP 110/72 (BP  Location: Left Arm, Patient Position: Sitting, Cuff Size: Normal)   Pulse 70   Ht 4\' 9"  (1.448 m)   Wt 156 lb 2 oz (70.8 kg)   SpO2 96%   BMI 33.79 kg/m     Wt Readings from Last 3 Encounters:  05/07/20 156 lb 2 oz (70.8 kg)  03/23/20 158 lb (71.7 kg)  03/20/20 158 lb (71.7 kg)     GEN:  Well nourished, well developed in no acute distress HEENT: Normal NECK: No JVD; No carotid bruits LYMPHATICS: No lymphadenopathy CARDIAC: RRR, no murmurs, rubs, gallops RESPIRATORY: Decreased breath sounds, rhonchi at bases. ABDOMEN: Soft, non-tender, non-distended MUSCULOSKELETAL:  No edema; No deformity  SKIN: Warm and dry NEUROLOGIC:  Alert and oriented x 3 PSYCHIATRIC:  Normal affect   ASSESSMENT:    1. Coronary artery disease involving native coronary artery of native heart without angina pectoris   2. Pure hypercholesterolemia    PLAN:    In order of problems listed above:  1. Patient with nonobstructive CAD, coronary calcium score 185, moderate stenosis in left circumflex on CCTA.  Echo 08/2019 normal systolic and diastolic function, EF 60 to 65%.  Coronary CT 09/2019 showed mild to moderate stenosis in the left circumflex, approximately 50% FFR ct no sig stenosis.  She denies any symptoms of chest pain or shortness of breath.  Last LDL controlled.  Continue aspirin 81 mg, Crestor 20 mg daily. 2. History of hyperlipidemia, last LDL controlled.  Continue Crestor .  Plan for repeat lipid panel prior to follow-up visit.  Follow-up in 1 year.  This note was generated in part or whole with voice recognition software. Voice recognition is usually quite accurate but there are transcription errors that can and very often do occur. I apologize for any typographical errors that were not detected and corrected.  Medication Adjustments/Labs and Tests Ordered: Current medicines are reviewed at length with the patient today.  Concerns regarding medicines are outlined above.  Orders Placed  This  Encounter  Procedures  . Lipid panel  . EKG 12-Lead   No orders of the defined types were placed in this encounter.   Patient Instructions  Medication Instructions:   Your physician recommends that you continue on your current medications as directed. Please refer to the Current Medication list given to you today.   *If you need a refill on your cardiac medications before your next appointment, please call your pharmacy*   Lab Work:  Your physician recommends that you return for a FASTING lipid profile:  Just prior to your 1 year follow up.  - You will need to be fasting. Please do not have anything to eat or drink after midnight the morning you have the lab work. You may only have water or black coffee with no cream or sugar. - Please go to the [facility]. You will check in at the front desk to the right as you walk into the atrium. Valet Parking is offered if needed. - No appointment needed. You may go any day between 7 am and 6 pm.   Testing/Procedures: None Ordered   Follow-Up: At [facility], you and your health needs are our priority.  As part of our continuing mission to provide you with exceptional heart care, we have created designated Provider Care Teams.  These Care Teams include your primary Cardiologist (physician) and Advanced Practice Providers (APPs -  Physician Assistants and Nurse Practitioners) who all work together to provide you with the care you need, when you need it.  We recommend signing up for the patient portal called "MyChart".  Sign up information is provided on this After Visit Summary.  MyChart is used to connect with patients for Virtual Visits (Telemedicine).  Patients are able to view lab/test results, encounter notes, upcoming appointments, etc.  Non-urgent messages can be sent to your provider as well.   To learn more about what you can do with MyChart, go to [email or web address].    Your next appointment:   1 year(s)  The format  for your next appointment:   In Person  Provider:   Debbe Odea, MD   Other Instructions      Signed, Sara Odea, MD  05/07/2020 12:30 PM    [facility]

## 2020-05-11 NOTE — Progress Notes (Addendum)
Name: Sara Andrews   MRN: [id number]    DOB: 06/11/1956   Date:05/14/2020       Progress Note  Subjective  Chief Complaint  Follow up Shingles  HPI    Shingles: she developed flank pain about one week ago, blisters showed up on right flank , [facility] and was diagnosed with shingles August 2021 , she was given Valtrex and oxycodone, during follow up with me she was still in pain and we added Lyrica, she is not taking it at this time, but still has pain on left flank and would like a refill   Recurrent headaches: doing well on prn imitrex and zofran, she states not as frequent, she described pain as throbbing, photophobia, eye pain during episodes. Episodes are down to a couple of times a month   Senile Purpura: she continues to bruise easily on arms, she is on aspirin, and bleeds at times, discussed USPTF but cardiologist advised her to continue on ASA since she has CAD   DDD andspondylolisthesis:doing well since surgery done 02/2018. She is off pain  medication, taking prn tylenol only and since pain is under control,she used to see Dr. Noel Geroldohen but not seeing anyone right now, she takes prn baclofen    COPD/asthma : She was using Anoro a few times a week because of cost. We switched to Trelegy and is doing better, she denies productive or wheezing, has some sob with  Moderate activity   GERD/dyspesia: she has noticed more bloating, abdominal distention, states always had episodes of diarrhea and constipation but having more bowel urgency , no blood in stools or weight loss. Discussed referral to GI but she would like to hold off for now, worried about cost   Hypothyroidism: She is currently taking 75 mcg daily and half on Sundays, last TSH was at goal . She has gained weight back, no palpation  DM II : diagnosed at work back in 2012she used to take medications,A1C has been at goal for over 2 years She denies polyphagia, polyuria , she has polydipsia. Started on  Trulicity June 2018,but she was having worsening ofconstipation and indigestion,sowe switched to Ozempic 10/2018weight was 198 lbs and is down to 132.4lbs.She is off Ozempic since January and weight is up to 156 lbs  lbs. She still has intermittent nausea, discussed referral to GI. We will change from DM to history of DM, she is skipping meals, discussed balanced meals intense   Major Depression: She is doing okay, no longer seeing Dr. Elna BreslowEappen but taking medication as prescribed . Phq9 is positive today She needs refills of medication. She had a long history of depression but seems to be responding well to her medication. She is very upset about weight gain, otherwise doing well   Osteoporosis: discussed osteonecrosis of jaw, she will get it removed by dentist , we will hold off on starting medication until teeth are extracted, she is researching to see when she can go   Chest pain: intermittently,  she is under the care of  cardiologist. Dr. Azucena CecilAgbor-Etang and had EKG and echo that showed normal EF . CT lung had already shown some coronary calcification. Since her first visit with him her chest pain improved, still has intermittent symptoms. She is on crestor and aspirin. She states usually worse when around her grandson - she states he stresses her out, he has ADD and possible mood disorder   Nasal congestion: going on for the past 2 months, wakes up in  the middle of the night.   Atherosclerosis Aorta: found on CT done 03/20/2020, continue statin and aspirin  Patient Active Problem List   Diagnosis Date Noted  . Shingles 03/23/2020  . Fire ant bite 03/23/2020  . Burning sensation of lower extremity 03/22/2020  . Centrilobular emphysema (HCC) 01/12/2020  . Senile purpura (HCC) 01/12/2020  . Major depression in remission (HCC) 01/12/2020  . Dyslipidemia associated with type 2 diabetes mellitus (HCC) 01/12/2020  . Bursitis of hip 08/23/2019  . Carpal tunnel syndrome 08/23/2019  .  Thoracic aorta atherosclerosis (HCC) 08/03/2019  . Spondylosis of lumbar spine 11/06/2016  . Spinal stenosis of lumbar region with neurogenic claudication 10/28/2016  . Chronic bilateral low back pain with bilateral sciatica 09/16/2016  . Hyperglycemia 08/07/2016  . Moderate episode of recurrent major depressive disorder (HCC) 08/07/2016  . Pain of left heel 06/02/2016  . Degenerative cervical disc 03/04/2016  . Gastroesophageal reflux disease without esophagitis 02/01/2016  . Primary osteoarthritis of right hand 02/01/2016  . Paresthesias in left hand 02/01/2016  . SOB (shortness of breath) 10/05/2013  . Asthma-COPD overlap syndrome (HCC) 10/05/2013  . History of smoking 10/05/2013  . Hyperlipidemia 10/05/2013  . Adult onset hypothyroidism 10/05/2013    Past Surgical History:  Procedure Laterality Date  . ABDOMINAL HYSTERECTOMY    . CARPAL TUNNEL RELEASE Right   . LUMBAR LAMINECTOMY  03/16/2018   L4-5 Laminectomy & Fusion w/ Pedicle Screws, TLIF & Allograft; Surgeon: Virl Diamond, MD; Location: [facility] MAIN OR; Service: Orthopedics; Laterality: N/A; Prone, ProAxis, Stim Neuromonitoring, O-Arm, Cell Saver, [employer], [employer], Justin, PACS on [facility]    . OOPHORECTOMY    . ORIF TIBIA PLATEAU Left 03/10/2016   Procedure: OPEN REDUCTION INTERNAL FIXATION (ORIF) BICONDYLAR TIBIAL PLATEAU FRACTURE;  Surgeon: Eldred Manges, MD;  Location: [facility] OR;  Service: Orthopedics;  Laterality: Left;  . SHOULDER ARTHROSCOPY W/ ROTATOR CUFF REPAIR Right   . TONSILLECTOMY AND ADENOIDECTOMY      Family History  Problem Relation Age of Onset  . Hypertension Daughter   . Breast cancer Daughter   . Diabetes Father   . Heart disease Father   . Breast cancer Maternal Aunt 60  . Breast cancer Paternal Aunt 53  . Depression Sister   . Alcohol abuse Sister   . Anxiety disorder Sister   . Bipolar disorder Sister   . Pneumonia Sister   . Skin cancer Daughter   . Anxiety disorder Daughter     Social  History   Tobacco Use  . Smoking status: Former Smoker    Packs/day: 2.00    Years: 30.00    Pack years: 60.00    Quit date: 09/03/2008    Years since quitting: 11.7  . Smokeless tobacco: Never Used  Substance Use Topics  . Alcohol use: No     Current Outpatient Medications:  .  aspirin EC 81 MG tablet, Take 1 tablet (81 mg total) by mouth daily., Disp: 90 tablet, Rfl: 3 .  baclofen (LIORESAL) 10 MG tablet, Take 1 tablet by mouth daily as needed., Disp: , Rfl:  .  buPROPion (WELLBUTRIN XL) 150 MG 24 hr tablet, Take 1 tablet (150 mg total) by mouth daily., Disp: 90 tablet, Rfl: 1 .  Cholecalciferol (VITAMIN D PO), Take 1 capsule by mouth daily., Disp: , Rfl:  .  diclofenac Sodium (VOLTAREN) 1 % GEL, Apply topically 4 (four) times daily., Disp: , Rfl:  .  DULoxetine (CYMBALTA) 60 MG capsule, Take 1 capsule (60 mg total)  by mouth daily., Disp: 90 capsule, Rfl: 0 .  Fluticasone-Umeclidin-Vilant (TRELEGY ELLIPTA) 100-62.5-25 MCG/INH AEPB, Inhale 1 puff into the lungs daily., Disp: 60 each, Rfl: 3 .  levothyroxine (SYNTHROID) 75 MCG tablet, TAKE 1 TABLET(75 MCG) BY MOUTH DAILY, Disp: 90 tablet, Rfl: 0 .  lidocaine (XYLOCAINE) 5 % ointment, Apply 1 application topically as needed., Disp: 240 g, Rfl: 0 .  Magnesium Oxide 400 MG CAPS, Take 1 capsule (400 mg total) by mouth once., Disp: 30 capsule, Rfl: 5 .  Melatonin 5 MG CAPS, Take 1 capsule by mouth daily., Disp: , Rfl:  .  meloxicam (MOBIC) 15 MG tablet, TAKE 1 TABLET(15 MG) BY MOUTH DAILY, Disp: 30 tablet, Rfl: 0 .  montelukast (SINGULAIR) 10 MG tablet, Take 1 tablet (10 mg total) by mouth at bedtime., Disp: 90 tablet, Rfl: 1 .  omeprazole (PRILOSEC) 40 MG capsule, TAKE ONE CAPSULE BY MOUTH DAILY, Disp: 90 capsule, Rfl: 1 .  ondansetron (ZOFRAN) 4 MG tablet, Take 1 tablet (4 mg total) by mouth every 8 (eight) hours as needed for nausea or vomiting., Disp: 20 tablet, Rfl: 0 .  pregabalin (LYRICA) 50 MG capsule, Take 1 capsule (50 mg total) by  mouth 3 (three) times daily., Disp: 90 capsule, Rfl: 1 .  rosuvastatin (CRESTOR) 20 MG tablet, Take 1 tablet (20 mg total) by mouth daily., Disp: 90 tablet, Rfl: 1 .  SUMAtriptan (IMITREX) 50 MG tablet, May repeat in 2 hours if headache persists or recurs. Do not take more than 2 in 24 hours., Disp: 10 tablet, Rfl: 0 .  traMADol (ULTRAM) 50 MG tablet, Take 50 mg by mouth every 8 (eight) hours as needed., Disp: , Rfl:  .  traZODone (DESYREL) 100 MG tablet, Take 1-1.5 tablets (100-150 mg total) by mouth at bedtime., Disp: 100 tablet, Rfl: 1 .  triamcinolone cream (KENALOG) 0.1 %, Apply 1 application topically 2 (two) times daily., Disp: 30 g, Rfl: 0 .  fluticasone (FLONASE) 50 MCG/ACT nasal spray, Place 2 sprays into both nostrils daily., Disp: 16 g, Rfl: 0 .  metoprolol tartrate (LOPRESSOR) 100 MG tablet, Take 1 tablet (100 mg total) by mouth once for 1 dose. Take 2 hours prior to the CT., Disp: 1 tablet, Rfl: 0  No Known Allergies  I personally reviewed active problem list, medication list, allergies, family history, social history, health maintenance with the patient/caregiver today.   ROS  Constitutional: Negative for fever, positive  weight change.  Respiratory: Negative for cough and shortness of breath.   Cardiovascular: Negative for chest pain or palpitations.  Gastrointestinal: Negative for abdominal pain, no bowel changes.  Musculoskeletal: Negative for gait problem or joint swelling.  Skin: Negative for rash.  Neurological: Negative for dizziness , positive  headache.  No other specific complaints in a complete review of systems (except as listed in HPI above).  Objective  Vitals:   05/14/20 1031  BP: 112/76  Pulse: 71  Resp: 16  Temp: 97.7 F (36.5 C)  TempSrc: Oral  SpO2: 96%  Weight: 156 lb 4.8 oz (70.9 kg)  Height:  (1.448 m)    Body mass index is 33.82 kg/m.  Physical Exam  Constitutional: Patient appears well-developed and well-nourished. Obese  No  distress.  HEENT: head atraumatic, normocephalic, pupils equal and reactive to light, neck supple Cardiovascular: Normal rate, regular rhythm and normal heart sounds.  No murmur heard. No BLE edema. Pulmonary/Chest: Effort normal and breath sounds normal. No respiratory distress. Abdominal: Soft.  There is no tenderness.  Skin:senile purpura, some areas of recent bleeding, states trimming bushes over the weekend Psychiatric: Patient has a normal mood and affect. behavior is normal. Judgment and thought content normal.  Recent Results (from the past 2160 hour(s))  Lipase, blood     Status: None   Collection Time: 03/20/20  6:48 PM  Result Value Ref Range   Lipase 32 11 - 51 U/L    Comment: Performed at [facility], [address] [place], [place] [address]  Comprehensive metabolic panel     Status: None   Collection Time: 03/20/20  6:48 PM  Result Value Ref Range   Sodium 139 135 - 145 mmol/L   Potassium 4.7 3.5 - 5.1 mmol/L   Chloride 102 98 - 111 mmol/L   CO2 28 22 - 32 mmol/L   Glucose, Bld 91 70 - 99 mg/dL    Comment: Glucose reference range applies only to samples taken after fasting for at least 8 hours.   BUN 13 8 - 23 mg/dL   Creatinine, Ser 6.04 0.44 - 1.00 mg/dL   Calcium 9.2 8.9 - 54.0 mg/dL   Total Protein 6.7 6.5 - 8.1 g/dL   Albumin 4.2 3.5 - 5.0 g/dL   AST 31 15 - 41 U/L   ALT 37 0 - 44 U/L   Alkaline Phosphatase 75 38 - 126 U/L   Total Bilirubin 1.1 0.3 - 1.2 mg/dL   GFR calc non Af Amer >60 >60 mL/min   GFR calc Af Amer >60 >60 mL/min   Anion gap 9 5 - 15    Comment: Performed at [facility], [address] [place], [place] [address]  CBC     Status: None   Collection Time: 03/20/20  6:48 PM  Result Value Ref Range   WBC 5.3 4.0 - 10.5 K/uL   RBC 4.53 3.87 - 5.11 MIL/uL   Hemoglobin 13.4 12.0 - 15.0 g/dL   HCT 14.7 36 - 46 %   MCV 91.2 80.0 - 100.0 fL   MCH 29.6 26.0 - 34.0 pg   MCHC 32.4 30.0 - 36.0 g/dL   RDW 82.9 56.2 - 13.0 %    Platelets 214 150 - 400 K/uL   nRBC 0.0 0.0 - 0.2 %    Comment: Performed at [facility], [address] [place], [place] [address]  Urinalysis, Complete w Microscopic     Status: Abnormal   Collection Time: 03/20/20  6:48 PM  Result Value Ref Range   Color, Urine STRAW (A) YELLOW   APPearance CLEAR (A) CLEAR   Specific Gravity, Urine 1.004 (L) 1.005 - 1.030   pH 8.0 5.0 - 8.0   Glucose, UA NEGATIVE NEGATIVE mg/dL   Hgb urine dipstick NEGATIVE NEGATIVE   Bilirubin Urine NEGATIVE NEGATIVE   Ketones, ur NEGATIVE NEGATIVE mg/dL   Protein, ur NEGATIVE NEGATIVE mg/dL   Nitrite NEGATIVE NEGATIVE   Leukocytes,Ua NEGATIVE NEGATIVE   RBC / HPF 0-5 0 - 5 RBC/hpf   WBC, UA 0-5 0 - 5 WBC/hpf   Bacteria, UA NONE SEEN NONE SEEN   Squamous Epithelial / LPF NONE SEEN 0 - 5    Comment: Performed at [facility], [address] [place], [place] [address]  POCT HgB A1C     Status: Abnormal   Collection Time: 05/14/20 10:34 AM  Result Value Ref Range   Hemoglobin A1C 5.7 (A) 4.0 - 5.6 %   HbA1c POC (<> result, manual entry)     HbA1c,  POC (prediabetic range)     HbA1c, POC (controlled diabetic range)       PHQ2/9: Depression screen [facility] 2/9 05/14/2020 05/14/2020 03/23/2020 01/12/2020 08/23/2019  Decreased Interest 0 0 0 0 3  Down, Depressed, Hopeless 1 1 0 0 0  PHQ - 2 Score 1 1 0 0 3  Altered sleeping 1 - 0 1 1  Tired, decreased energy 0 - 1 1 0  Change in appetite 0 - 0 1 0  Feeling bad or failure about yourself  1 - 0 0 0  Trouble concentrating 0 - 0 0 0  Moving slowly or fidgety/restless 0 - 0 0 0  Suicidal thoughts 0 - 0 0 0  PHQ-9 Score 3 - 1 3 4   Difficult doing work/chores Not difficult at all - Not difficult at all Not difficult at all Not difficult at all  Some recent data might be hidden    phq 9 is positive   Fall Risk: Fall Risk  05/14/2020 03/23/2020 01/12/2020 08/23/2019 07/12/2019  Falls in the past year? 0 0 0 0 0  Number falls in past yr: 0 0 0 0 0   Injury with Fall? 0 0 0 0 0  Comment - - - - -  Follow up - - - - -     Functional Status Survey: Is the patient deaf or have difficulty hearing?: No Does the patient have difficulty seeing, even when wearing glasses/contacts?: No Does the patient have difficulty concentrating, remembering, or making decisions?: No Does the patient have difficulty walking or climbing stairs?: Yes Does the patient have difficulty dressing or bathing?: No Does the patient have difficulty doing errands alone such as visiting a doctor's office or shopping?: No    Assessment & Plan   1. Thoracic aorta atherosclerosis (HCC)  - rosuvastatin (CRESTOR) 20 MG tablet; Take 1 tablet (20 mg total) by mouth daily.  Dispense: 90 tablet; Refill: 1  2. History of diabetes mellitus, type II  - POCT HgB A1C  3. Need for immunization against influenza  - Flu Vaccine QUAD High Dose(Fluad)  4. Adult onset hypothyroidism  - TSH  5. Dyslipidemia  - rosuvastatin (CRESTOR) 20 MG tablet; Take 1 tablet (20 mg total) by mouth daily.  Dispense: 90 tablet; Refill: 1  6. Hyperglycemia   7. Gastroesophageal reflux disease without esophagitis  - omeprazole (PRILOSEC) 40 MG capsule; TAKE ONE CAPSULE BY MOUTH DAILY  Dispense: 90 capsule; Refill: 1  8. Migraine without aura and without status migrainosus, not intractable  SUMAtriptan (IMITREX) 50 MG tablet; May repeat in 2 hours if headache persists or recurs. Do not take more than 2 in 24 hours.  Dispense: 10 tablet; Refill: 0  9. Senile purpura (HCC)   10. Major depression in remission (HCC)  - buPROPion (WELLBUTRIN XL) 150 MG 24 hr tablet; Take 1 tablet (150 mg total) by mouth daily.  Dispense: 90 tablet; Refill: 1 - DULoxetine (CYMBALTA) 60 MG capsule; Take 1 capsule (60 mg total) by mouth daily.  Dispense: 90 capsule; Refill: 0  11. Asthma-COPD overlap syndrome (HCC)  - Fluticasone-Umeclidin-Vilant (TRELEGY ELLIPTA) 100-62.5-25 MCG/INH AEPB; Inhale 1  puff into the lungs daily.  Dispense: 60 each; Refill: 3 - montelukast (SINGULAIR) 10 MG tablet; Take 1 tablet (10 mg total) by mouth at bedtime.  Dispense: 90 tablet; Refill: 1  12. Herpes zoster without complication  - pregabalin (LYRICA) 50 MG capsule; Take 1 capsule (50 mg total) by mouth 3 (three) times daily.  Dispense: 90  capsule; Refill: 1  13. Insomnia due to mental disorder  - traZODone (DESYREL) 100 MG tablet; Take 1-1.5 tablets (100-150 mg total) by mouth at bedtime.  Dispense: 100 tablet; Refill: 1  14. Nasal congestion  - fluticasone (FLONASE) 50 MCG/ACT nasal spray; Place 2 sprays into both nostrils daily.  Dispense: 16 g; Refill: 0

## 2020-05-14 ENCOUNTER — Other Ambulatory Visit: Payer: Self-pay

## 2020-05-14 ENCOUNTER — Ambulatory Visit (INDEPENDENT_AMBULATORY_CARE_PROVIDER_SITE_OTHER): Payer: Medicare Other | Admitting: Family Medicine

## 2020-05-14 ENCOUNTER — Encounter: Payer: Self-pay | Admitting: Family Medicine

## 2020-05-14 ENCOUNTER — Other Ambulatory Visit: Payer: Self-pay | Admitting: Family Medicine

## 2020-05-14 VITALS — BP 112/76 | HR 71 | Temp 97.7°F | Resp 16 | Ht <= 58 in | Wt 156.3 lb

## 2020-05-14 DIAGNOSIS — R739 Hyperglycemia, unspecified: Secondary | ICD-10-CM

## 2020-05-14 DIAGNOSIS — Z8639 Personal history of other endocrine, nutritional and metabolic disease: Secondary | ICD-10-CM

## 2020-05-14 DIAGNOSIS — D692 Other nonthrombocytopenic purpura: Secondary | ICD-10-CM | POA: Diagnosis not present

## 2020-05-14 DIAGNOSIS — Z23 Encounter for immunization: Secondary | ICD-10-CM | POA: Diagnosis not present

## 2020-05-14 DIAGNOSIS — E1129 Type 2 diabetes mellitus with other diabetic kidney complication: Secondary | ICD-10-CM

## 2020-05-14 DIAGNOSIS — F325 Major depressive disorder, single episode, in full remission: Secondary | ICD-10-CM

## 2020-05-14 DIAGNOSIS — R0981 Nasal congestion: Secondary | ICD-10-CM

## 2020-05-14 DIAGNOSIS — I700 Atherosclerosis of aorta: Secondary | ICD-10-CM

## 2020-05-14 DIAGNOSIS — J4489 Other specified chronic obstructive pulmonary disease: Secondary | ICD-10-CM

## 2020-05-14 DIAGNOSIS — G43009 Migraine without aura, not intractable, without status migrainosus: Secondary | ICD-10-CM

## 2020-05-14 DIAGNOSIS — B029 Zoster without complications: Secondary | ICD-10-CM

## 2020-05-14 DIAGNOSIS — E785 Hyperlipidemia, unspecified: Secondary | ICD-10-CM

## 2020-05-14 DIAGNOSIS — F5105 Insomnia due to other mental disorder: Secondary | ICD-10-CM

## 2020-05-14 DIAGNOSIS — E038 Other specified hypothyroidism: Secondary | ICD-10-CM

## 2020-05-14 DIAGNOSIS — K219 Gastro-esophageal reflux disease without esophagitis: Secondary | ICD-10-CM

## 2020-05-14 DIAGNOSIS — J449 Chronic obstructive pulmonary disease, unspecified: Secondary | ICD-10-CM

## 2020-05-14 LAB — POCT GLYCOSYLATED HEMOGLOBIN (HGB A1C): Hemoglobin A1C: 5.7 % — AB (ref 4.0–5.6)

## 2020-05-14 MED ORDER — TRELEGY ELLIPTA 100-62.5-25 MCG/INH IN AEPB: 1.0000 | INHALATION_SPRAY | Freq: Every day | RESPIRATORY_TRACT | 3 refills | Status: DC

## 2020-05-14 MED ORDER — PREGABALIN 50 MG PO CAPS: 50.0000 mg | ORAL_CAPSULE | Freq: Three times a day (TID) | ORAL | 1 refills | Status: DC

## 2020-05-14 MED ORDER — MONTELUKAST SODIUM 10 MG PO TABS: 10.0000 mg | ORAL_TABLET | Freq: Every day | ORAL | 1 refills | Status: DC

## 2020-05-14 MED ORDER — SUMATRIPTAN SUCCINATE 50 MG PO TABS: ORAL_TABLET | ORAL | 0 refills | Status: DC

## 2020-05-14 MED ORDER — ROSUVASTATIN CALCIUM 20 MG PO TABS: 20.0000 mg | ORAL_TABLET | Freq: Every day | ORAL | 1 refills | Status: DC

## 2020-05-14 MED ORDER — BUPROPION HCL ER (XL) 150 MG PO TB24: 150.0000 mg | ORAL_TABLET | Freq: Every day | ORAL | 1 refills | Status: DC

## 2020-05-14 MED ORDER — TRAZODONE HCL 100 MG PO TABS: 100.0000 mg | ORAL_TABLET | Freq: Every evening | ORAL | 1 refills | Status: DC

## 2020-05-14 MED ORDER — OMEPRAZOLE 40 MG PO CPDR: DELAYED_RELEASE_CAPSULE | ORAL | 1 refills | Status: DC

## 2020-05-14 MED ORDER — DULOXETINE HCL 60 MG PO CPEP: 60.0000 mg | ORAL_CAPSULE | Freq: Every day | ORAL | 0 refills | Status: DC

## 2020-05-14 MED ORDER — FLUTICASONE PROPIONATE 50 MCG/ACT NA SUSP: 2.0000 | Freq: Every day | NASAL | 0 refills | Status: DC

## 2020-05-15 LAB — TSH: TSH: 0.78 mIU/L (ref 0.40–4.50)

## 2020-05-25 ENCOUNTER — Other Ambulatory Visit: Payer: Self-pay | Admitting: Family Medicine

## 2020-05-25 DIAGNOSIS — F5105 Insomnia due to other mental disorder: Secondary | ICD-10-CM

## 2020-05-28 ENCOUNTER — Other Ambulatory Visit: Payer: Self-pay

## 2020-05-28 ENCOUNTER — Other Ambulatory Visit: Payer: Self-pay | Admitting: Family Medicine

## 2020-05-28 DIAGNOSIS — J449 Chronic obstructive pulmonary disease, unspecified: Secondary | ICD-10-CM

## 2020-05-28 NOTE — Telephone Encounter (Signed)
Requested medication (s) are due for refill today: no  Requested medication (s) are on the active medication list: yes  Last refill:  05/14/20  Future visit scheduled: yes  Notes to clinic:  med not assigned to a protocol   Requested Prescriptions  Pending Prescriptions Disp Refills   TRELEGY ELLIPTA 100-62.5-25 MCG/INH AEPB [Pharmacy Med Name: TRELEGY ELLIPTA 100-62. INH 30P] 60 each 3    Sig: Inhale 1 puff into the lungs daily.      Off-Protocol Failed - 05/28/2020 12:17 PM      Failed - Medication not assigned to a protocol, review manually.      Passed - Valid encounter within last 12 months    Recent Outpatient Visits           2 weeks ago Senile purpura [facility])   [facility] [place], Danna Hefty, MD   2 months ago Herpes zoster without complication   [facility], Danna Hefty, MD   4 months ago Dyslipidemia associated with type 2 diabetes mellitus [facility])   [facility] Alba Cory, MD   9 months ago Dyslipidemia associated with type 2 diabetes mellitus [facility])   [facility] Alba Cory, MD   10 months ago [employer] preventive visit   [facility] Alba Cory, MD       Future Appointments             In 3 months Carlynn Purl, Danna Hefty, MD [facility], [facility]

## 2020-05-30 ENCOUNTER — Telehealth: Payer: Self-pay | Admitting: Family Medicine

## 2020-05-30 NOTE — Telephone Encounter (Signed)
Pt called stating she missed a call form [facility]/please advise

## 2020-05-30 NOTE — Telephone Encounter (Signed)
Per initial encounter, "Patient states she gets a yearly cough and PCP is aware. Patient requesting cough medication. Denied OV informed her virtual appointment may be needed. Please advise"   [id number] - [place], [place] - [address] AT [facility] [address] [facility] Phone:  [phone]  Fax:  [phone]     Attempted to contact pt to obtain more information related to medication; left message on voicemail; will route to office for final disposition.

## 2020-05-30 NOTE — Telephone Encounter (Signed)
Patient states she gets a yearly cough and PCP is aware. Patient requesting cough medication. Denied OV informed her virtual appointment may be needed. Please advise   [id number] [place], [place] - [address] AT [facility] [address] [facility] Phone:  [phone]  Fax:  [phone]

## 2020-05-31 NOTE — Telephone Encounter (Signed)
Requested medication (s) are due for refill today: Tussinex, yes  Requested medication (s) are on the active medication list: no  Last refill:  ?  Future visit scheduled: yes  Notes to clinic: not delegated

## 2020-05-31 NOTE — Telephone Encounter (Signed)
Pt called to let Nurse know it is Tussinex that she is usually prescribed/ she stated it is yellowish in color and the only med she can take/ please advise asap

## 2020-05-31 NOTE — Telephone Encounter (Signed)
Have you been trying to reach this patient

## 2020-06-01 ENCOUNTER — Telehealth: Payer: Self-pay

## 2020-06-01 NOTE — Telephone Encounter (Signed)
Good afternoon, per our telephone conversation I have scheduled the patient a mychart visit with Dr Carlynn Purl for 06/04/2020 at 11:40 Copied from CRM [id number]. Topic: General - Other >> Jun 01, 2020 12:48 PM Dalphine Handing A wrote: Patient stated that she has been waiting on a callback in regards to tussinex medication being called into pharmacy for her cough. Please advise

## 2020-06-04 ENCOUNTER — Other Ambulatory Visit: Payer: Self-pay

## 2020-06-04 NOTE — Progress Notes (Signed)
Name: Sara Andrews   MRN: [id number]    DOB: 03-25-1956   Date:06/04/2020       Progress Note  Subjective  Chief Complaint  Cough  I connected with  Sara Andrews  on 06/04/20 at 11:40 AM EST by a telephone encounter  and verified that I am speaking with the correct person using two identifiers.  I discussed the limitations of evaluation and management by telemedicine and the availability of in person appointments. The patient expressed understanding and agreed to proceed with the virtual visit  Staff also discussed with the patient that there may be a patient responsible charge related to this service. Patient Location: at home  Provider Location: [facility]   HPI  COPD exacerbation: she states symptoms started 6 days ago with a dry cough, getting progressively worse, now she has sinus pressure, post-nasal drainage and the cough is productive with yellow sputum. No fever or chills. Denies lack of sense of taste or smell. She denies wheezing or sob but has been using Trelegy daily , using rescue inhaler at most once a day. She states appetite is normal. No sick contacts.  She had two doses of COVID-19 and also flu shot   Patient Active Problem List   Diagnosis Date Noted   Shingles 03/23/2020   Fire ant bite 03/23/2020   Burning sensation of lower extremity 03/22/2020   Centrilobular emphysema (HCC) 01/12/2020   Senile purpura (HCC) 01/12/2020   Major depression in remission (HCC) 01/12/2020   Dyslipidemia associated with type 2 diabetes mellitus (HCC) 01/12/2020   Bursitis of hip 08/23/2019   Carpal tunnel syndrome 08/23/2019   Thoracic aorta atherosclerosis (HCC) 08/03/2019   Spondylosis of lumbar spine 11/06/2016   Spinal stenosis of lumbar region with neurogenic claudication 10/28/2016   Chronic bilateral low back pain with bilateral sciatica 09/16/2016   Hyperglycemia 08/07/2016   Moderate episode of recurrent major depressive disorder (HCC) 08/07/2016    Pain of left heel 06/02/2016   Degenerative cervical disc 03/04/2016   Gastroesophageal reflux disease without esophagitis 02/01/2016   Primary osteoarthritis of right hand 02/01/2016   Paresthesias in left hand 02/01/2016   SOB (shortness of breath) 10/05/2013   Asthma-COPD overlap syndrome (HCC) 10/05/2013   History of smoking 10/05/2013   Hyperlipidemia 10/05/2013   Adult onset hypothyroidism 10/05/2013    Past Surgical History:  Procedure Laterality Date   ABDOMINAL HYSTERECTOMY     CARPAL TUNNEL RELEASE Right    LUMBAR LAMINECTOMY  03/16/2018   L4-5 Laminectomy & Fusion w/ Pedicle Screws, TLIF & Allograft; Surgeon: Virl Diamond, MD; Location: [facility] MAIN OR; Service: Orthopedics; Laterality: N/A; Prone, ProAxis, Stim Neuromonitoring, O-Arm, Cell Saver, [employer], [employer], Justin, PACS on [facility]     OOPHORECTOMY     ORIF TIBIA PLATEAU Left 03/10/2016   Procedure: OPEN REDUCTION INTERNAL FIXATION (ORIF) BICONDYLAR TIBIAL PLATEAU FRACTURE;  Surgeon: Eldred Manges, MD;  Location: [facility] OR;  Service: Orthopedics;  Laterality: Left;   SHOULDER ARTHROSCOPY W/ ROTATOR CUFF REPAIR Right    TONSILLECTOMY AND ADENOIDECTOMY      Family History  Problem Relation Age of Onset   Hypertension Daughter    Breast cancer Daughter    Diabetes Father    Heart disease Father    Breast cancer Maternal Aunt 68   Breast cancer Paternal Aunt 45   Depression Sister    Alcohol abuse Sister    Anxiety disorder Sister    Bipolar disorder Sister    Pneumonia Sister  Skin cancer Daughter    Anxiety disorder Daughter     Social History   Socioeconomic History   Marital status: Widowed    Spouse name: Not on file   Number of children: 3   Years of education: Not on file   Highest education level: 10th grade  Occupational History   Occupation: disabled    Comment: retired  Tobacco Use   Smoking status: Former Smoker    Packs/day: 2.00    Years: 30.00     Pack years: 60.00    Quit date: 09/03/2008    Years since quitting: 11.7   Smokeless tobacco: Never Used  Vaping Use   Vaping Use: Never used  Substance and Sexual Activity   Alcohol use: No   Drug use: No   Sexual activity: Never  Other Topics Concern   Not on file  Social History Narrative   She used to work at  [employer], but had a left knee work related injury 03/10/2016 , require left knee surgery and is now having back pain, that is getting evaluated, Out of work since.       Patient recently found out that one of her twins [name](Christie) was diagnosed with breast cancer); will be having a double mastectomy soon.      Social Determinants of Health   Financial Resource Strain: Low Risk    Difficulty of Paying Living Expenses: Not hard at all  Food Insecurity: No Food Insecurity   Worried About Programme researcher, broadcasting/film/videounning Out of Food in the Last Year: Never true   Ran Out of Food in the Last Year: Never true  Transportation Needs: No Transportation Needs   Lack of Transportation (Medical): No   Lack of Transportation (Non-Medical): No  Physical Activity: Sufficiently Active   Days of Exercise per Week: 7 days   Minutes of Exercise per Session: 40 min  Stress: No Stress Concern Present   Feeling of Stress : Not at all  Social Connections: Socially Integrated   Frequency of Communication with Friends and Family: More than three times a week   Frequency of Social Gatherings with Friends and Family: More than three times a week   Attends Religious Services: More than 4 times per year   Active Member of [employer]ubs or Organizations: Yes   Attends Engineer, structuralClub or Organization Meetings: More than 4 times per year   Marital Status: Married  Catering managerntimate Partner Violence: Not At Risk   Fear of Current or Ex-Partner: No   Emotionally Abused: No   Physically Abused: No   Sexually Abused: No     Current Outpatient Medications:    aspirin EC 81 MG tablet, Take 1 tablet (81 mg total) by  mouth daily., Disp: 90 tablet, Rfl: 3   baclofen (LIORESAL) 10 MG tablet, Take 1 tablet by mouth daily as needed., Disp: , Rfl:    buPROPion (WELLBUTRIN XL) 150 MG 24 hr tablet, Take 1 tablet (150 mg total) by mouth daily., Disp: 90 tablet, Rfl: 1   Cholecalciferol (VITAMIN D PO), Take 1 capsule by mouth daily., Disp: , Rfl:    diclofenac Sodium (VOLTAREN) 1 % GEL, Apply topically 4 (four) times daily., Disp: , Rfl:    DULoxetine (CYMBALTA) 60 MG capsule, Take 1 capsule (60 mg total) by mouth daily., Disp: 90 capsule, Rfl: 0   fluticasone (FLONASE) 50 MCG/ACT nasal spray, SHAKE LIQUID AND USE 2 SPRAYS IN EACH NOSTRIL DAILY, Disp: 48 g, Rfl: 0   levothyroxine (SYNTHROID) 75 MCG tablet, TAKE 1 TABLET(75  MCG) BY MOUTH DAILY, Disp: 90 tablet, Rfl: 0   lidocaine (XYLOCAINE) 5 % ointment, Apply 1 application topically as needed., Disp: 240 g, Rfl: 0   Magnesium Oxide 400 MG CAPS, Take 1 capsule (400 mg total) by mouth once., Disp: 30 capsule, Rfl: 5   Melatonin 5 MG CAPS, Take 1 capsule by mouth daily., Disp: , Rfl:    meloxicam (MOBIC) 15 MG tablet, TAKE 1 TABLET(15 MG) BY MOUTH DAILY, Disp: 30 tablet, Rfl: 0   metoprolol tartrate (LOPRESSOR) 100 MG tablet, Take 1 tablet (100 mg total) by mouth once for 1 dose. Take 2 hours prior to the CT., Disp: 1 tablet, Rfl: 0   montelukast (SINGULAIR) 10 MG tablet, Take 1 tablet (10 mg total) by mouth at bedtime., Disp: 90 tablet, Rfl: 1   omeprazole (PRILOSEC) 40 MG capsule, TAKE ONE CAPSULE BY MOUTH DAILY, Disp: 90 capsule, Rfl: 1   ondansetron (ZOFRAN) 4 MG tablet, Take 1 tablet (4 mg total) by mouth every 8 (eight) hours as needed for nausea or vomiting., Disp: 20 tablet, Rfl: 0   pregabalin (LYRICA) 50 MG capsule, Take 1 capsule (50 mg total) by mouth 3 (three) times daily., Disp: 90 capsule, Rfl: 1   rosuvastatin (CRESTOR) 20 MG tablet, Take 1 tablet (20 mg total) by mouth daily., Disp: 90 tablet, Rfl: 1   SUMAtriptan (IMITREX) 50 MG tablet,  May repeat in 2 hours if headache persists or recurs. Do not take more than 2 in 24 hours., Disp: 10 tablet, Rfl: 0   traMADol (ULTRAM) 50 MG tablet, Take 50 mg by mouth every 8 (eight) hours as needed., Disp: , Rfl:    traZODone (DESYREL) 100 MG tablet, Take 1-1.5 tablets (100-150 mg total) by mouth at bedtime., Disp: 100 tablet, Rfl: 1   TRELEGY ELLIPTA 100-62.5-25 MCG/INH AEPB, INHALE 1 PUFF INTO THE LUNGS DAILY, Disp: 60 each, Rfl: 3   triamcinolone cream (KENALOG) 0.1 %, Apply 1 application topically 2 (two) times daily., Disp: 30 g, Rfl: 0  No Known Allergies  I personally reviewed active problem list, medication list, allergies, family history, social history, health maintenance with the patient/caregiver today.   ROS  Ten systems reviewed and is negative except as mentioned in HPI   Objective  Virtual encounter, vitals not obtained.  There is no height or weight on file to calculate BMI.  Physical Exam  Awake, alert and oriented  PHQ2/9: Depression screen [facility] 2/9 05/14/2020 05/14/2020 03/23/2020 01/12/2020 08/23/2019  Decreased Interest 0 0 0 0 3  Down, Depressed, Hopeless 1 1 0 0 0  PHQ - 2 Score 1 1 0 0 3  Altered sleeping 1 - 0 1 1  Tired, decreased energy 0 - 1 1 0  Change in appetite 0 - 0 1 0  Feeling bad or failure about yourself  1 - 0 0 0  Trouble concentrating 0 - 0 0 0  Moving slowly or fidgety/restless 0 - 0 0 0  Suicidal thoughts 0 - 0 0 0  PHQ-9 Score 3 - 1 3 4   Difficult doing work/chores Not difficult at all - Not difficult at all Not difficult at all Not difficult at all  Some recent data might be hidden   PHQ-2/9 Result is positive.    Fall Risk: Fall Risk  05/14/2020 03/23/2020 01/12/2020 08/23/2019 07/12/2019  Falls in the past year? 0 0 0 0 0  Number falls in past yr: 0 0 0 0 0  Injury with Fall? 0 0 0  0 0  Comment - - - - -  Follow up - - - - -    Assessment & Plan  1. COPD with acute exacerbation (HCC)  Advised her to be tested for  COVID-19, just in case it is positive we can send her for monoclonal antibodies.  We will hold off on Tussionex and prednisone until COVID19 results is back Advised to get a pulse oximeter today and go to [facility] if pulse ox below 93 %   - albuterol (PROVENTIL) (2.5 MG/3ML) 0.083% nebulizer solution; Take 3 mLs (2.5 mg total) by nebulization every 6 (six) hours as needed for wheezing or shortness of breath.  Dispense: 50 mL; Refill: 1 - amoxicillin-clavulanate (AUGMENTIN) 875-125 MG tablet; Take 1 tablet by mouth 2 (two) times daily.  Dispense: 14 tablet; Refill: 0 - benzonatate (TESSALON) 100 MG capsule; Take 1-2 capsules (100-200 mg total) by mouth 2 (two) times daily as needed.  Dispense: 40 capsule; Refill: 0 - Misc. Devices (PULSE OXIMETER) MISC; 1 each by Does not apply route 4 (four) times daily.  Dispense: 1 each; Refill: 0  I discussed the assessment and treatment plan with the patient. The patient was provided an opportunity to ask questions and all were answered. The patient agreed with the plan and demonstrated an understanding of the instructions.  The patient was advised to call back or seek an in-person evaluation if the symptoms worsen or if the condition fails to improve as anticipated.  I provided 25  minutes of non-face-to-face time during this encounter.

## 2020-06-05 ENCOUNTER — Encounter: Payer: Self-pay | Admitting: Family Medicine

## 2020-06-05 ENCOUNTER — Telehealth (INDEPENDENT_AMBULATORY_CARE_PROVIDER_SITE_OTHER): Payer: Medicare Other | Admitting: Family Medicine

## 2020-06-05 ENCOUNTER — Other Ambulatory Visit: Payer: Self-pay

## 2020-06-05 VITALS — Ht <= 58 in | Wt 156.0 lb

## 2020-06-05 DIAGNOSIS — J441 Chronic obstructive pulmonary disease with (acute) exacerbation: Secondary | ICD-10-CM

## 2020-06-05 MED ORDER — AMOXICILLIN-POT CLAVULANATE 875-125 MG PO TABS: 1.0000 | ORAL_TABLET | Freq: Two times a day (BID) | ORAL | 0 refills | Status: DC

## 2020-06-05 MED ORDER — BENZONATATE 100 MG PO CAPS: 100.0000 mg | ORAL_CAPSULE | Freq: Two times a day (BID) | ORAL | 0 refills | Status: DC | PRN

## 2020-06-05 MED ORDER — PULSE OXIMETER MISC: 1.0000 | Freq: Four times a day (QID) | 0 refills | Status: DC

## 2020-06-05 MED ORDER — ALBUTEROL SULFATE (2.5 MG/3ML) 0.083% IN NEBU: 2.5000 mg | INHALATION_SOLUTION | Freq: Four times a day (QID) | RESPIRATORY_TRACT | 1 refills | Status: DC | PRN

## 2020-06-08 ENCOUNTER — Telehealth: Payer: Self-pay | Admitting: Family Medicine

## 2020-06-08 NOTE — Telephone Encounter (Signed)
Patient checking on the status of cough rx,. Patient states PCP wanted to view negative COVID results (patient sent thru MyChart) please advise   [id number] [place], [place] - [address] AT [facility] OF [address] [facility] Phone:  [phone]  Fax:  [phone]

## 2020-06-11 ENCOUNTER — Other Ambulatory Visit: Payer: Self-pay | Admitting: Family Medicine

## 2020-06-11 DIAGNOSIS — R059 Cough, unspecified: Secondary | ICD-10-CM

## 2020-06-11 MED ORDER — PROMETHAZINE-CODEINE 6.25-10 MG/5ML PO SYRP: 5.0000 mL | ORAL_SOLUTION | Freq: Every evening | ORAL | 0 refills | Status: DC

## 2020-06-11 NOTE — Telephone Encounter (Signed)
Spoke with patient this morning and she is still experiencing coughing.  She states it is worse at night.  Would like for the prescriptions to be sent to [facility] pharmacy.

## 2020-06-14 ENCOUNTER — Other Ambulatory Visit: Payer: Self-pay | Admitting: Family Medicine

## 2020-06-14 DIAGNOSIS — F325 Major depressive disorder, single episode, in full remission: Secondary | ICD-10-CM

## 2020-06-22 ENCOUNTER — Other Ambulatory Visit: Payer: Self-pay | Admitting: Family Medicine

## 2020-06-22 DIAGNOSIS — E038 Other specified hypothyroidism: Secondary | ICD-10-CM

## 2020-07-03 ENCOUNTER — Other Ambulatory Visit: Payer: Self-pay | Admitting: Family Medicine

## 2020-07-03 DIAGNOSIS — M7550 Bursitis of unspecified shoulder: Secondary | ICD-10-CM

## 2020-07-09 ENCOUNTER — Other Ambulatory Visit: Payer: Self-pay

## 2020-07-09 DIAGNOSIS — J441 Chronic obstructive pulmonary disease with (acute) exacerbation: Secondary | ICD-10-CM

## 2020-07-09 MED ORDER — ALBUTEROL SULFATE (2.5 MG/3ML) 0.083% IN NEBU: 2.5000 mg | INHALATION_SOLUTION | Freq: Four times a day (QID) | RESPIRATORY_TRACT | 1 refills | Status: DC | PRN

## 2020-07-12 ENCOUNTER — Other Ambulatory Visit: Payer: Self-pay | Admitting: Family Medicine

## 2020-07-12 ENCOUNTER — Ambulatory Visit (INDEPENDENT_AMBULATORY_CARE_PROVIDER_SITE_OTHER): Payer: Medicare Other

## 2020-07-12 DIAGNOSIS — Z0000 Encounter for general adult medical examination without abnormal findings: Secondary | ICD-10-CM | POA: Diagnosis not present

## 2020-07-12 DIAGNOSIS — Z1231 Encounter for screening mammogram for malignant neoplasm of breast: Secondary | ICD-10-CM | POA: Diagnosis not present

## 2020-07-12 MED ORDER — ALBUTEROL SULFATE HFA 108 (90 BASE) MCG/ACT IN AERS: 2.0000 | INHALATION_SPRAY | Freq: Four times a day (QID) | RESPIRATORY_TRACT | 0 refills | Status: DC | PRN

## 2020-07-12 NOTE — Patient Instructions (Signed)
Sara Andrews , Thank you for taking time to come for your Medicare Wellness Visit. I appreciate your ongoing commitment to your health goals. Please review the following plan we discussed and let me know if I can assist you in the future.   Screening recommendations/referrals: Colonoscopy: Cologuard completed 07/22/18. Repeat in 12/22. Mammogram: done 08/11/19. Please call [phone] to schedule your mammogram.  Bone Density: done 08/11/19 Recommended yearly ophthalmology/optometry visit for glaucoma screening and checkup Recommended yearly dental visit for hygiene and checkup  Vaccinations: Influenza vaccine: done 05/14/20 Pneumococcal vaccine: done 07/12/19 Tdap vaccine: done 04/28/17 Shingles vaccine: done 03/16/19; will call [facility] for second dose of vaccine   Covid-19:done 09/24/19 7 10/22/19  Advanced directives: Advance directive discussed with you today. I have provided a copy for you to complete at home and have notarized. Once this is complete please bring a copy in to our office so we can scan it into your chart.  Conditions/risks identified: Recommend increasing physical activity to at least 3 days per week   Next appointment: Follow up in one year for your annual wellness visit    Preventive Care 65 Years and Older, Female Preventive care refers to lifestyle choices and visits with your health care provider that can promote health and wellness. What does preventive care include?  A yearly physical exam. This is also called an annual well check.  Dental exams once or twice a year.  Routine eye exams. Ask your health care provider how often you should have your eyes checked.  Personal lifestyle choices, including:  Daily care of your teeth and gums.  Regular physical activity.  Eating a healthy diet.  Avoiding tobacco and drug use.  Limiting alcohol use.  Practicing safe sex.  Taking low-dose aspirin every day.  Taking vitamin and mineral supplements as  recommended by your health care provider. What happens during an annual well check? The services and screenings done by your health care provider during your annual well check will depend on your age, overall health, lifestyle risk factors, and family history of disease. Counseling  Your health care provider may ask you questions about your:  Alcohol use.  Tobacco use.  Drug use.  Emotional well-being.  Home and relationship well-being.  Sexual activity.  Eating habits.  History of falls.  Memory and ability to understand (cognition).  Work and work Astronomer.  Reproductive health. Screening  You may have the following tests or measurements:  Height, weight, and BMI.  Blood pressure.  Lipid and cholesterol levels. These may be checked every 5 years, or more frequently if you are over 72 years old.  Skin check.  Lung cancer screening. You may have this screening every year starting at age 83 if you have a 30-pack-year history of smoking and currently smoke or have quit within the past 15 years.  Fecal occult blood test (FOBT) of the stool. You may have this test every year starting at age 71.  Flexible sigmoidoscopy or colonoscopy. You may have a sigmoidoscopy every 5 years or a colonoscopy every 10 years starting at age 73.  Hepatitis C blood test.  Hepatitis B blood test.  Sexually transmitted disease (STD) testing.  Diabetes screening. This is done by checking your blood sugar (glucose) after you have not eaten for a while (fasting). You may have this done every 1-3 years.  Bone density scan. This is done to screen for osteoporosis. You may have this done starting at age 39.  Mammogram. This may be done every  1-2 years. Talk to your health care provider about how often you should have regular mammograms. Talk with your health care provider about your test results, treatment options, and if necessary, the need for more tests. Vaccines  Your health care  provider may recommend certain vaccines, such as:  Influenza vaccine. This is recommended every year.  Tetanus, diphtheria, and acellular pertussis (Tdap, Td) vaccine. You may need a Td booster every 10 years.  Zoster vaccine. You may need this after age 78.  Pneumococcal 13-valent conjugate (PCV13) vaccine. One dose is recommended after age 69.  Pneumococcal polysaccharide (PPSV23) vaccine. One dose is recommended after age 31. Talk to your health care provider about which screenings and vaccines you need and how often you need them. This information is not intended to replace advice given to you by your health care provider. Make sure you discuss any questions you have with your health care provider. Document Released: 08/10/2015 Document Revised: 04/02/2016 Document Reviewed: 05/15/2015 Elsevier Interactive Patient Education  2017 [place] Prevention in the Home Falls can cause injuries. They can happen to people of all ages. There are many things you can do to make your home safe and to help prevent falls. What can I do on the outside of my home?  Regularly fix the edges of walkways and driveways and fix any cracks.  Remove anything that might make you trip as you walk through a door, such as a raised step or threshold.  Trim any bushes or trees on the path to your home.  Use bright outdoor lighting.  Clear any walking paths of anything that might make someone trip, such as rocks or tools.  Regularly check to see if handrails are loose or broken. Make sure that both sides of any steps have handrails.  Any raised decks and porches should have guardrails on the edges.  Have any leaves, snow, or ice cleared regularly.  Use sand or salt on walking paths during winter.  Clean up any spills in your garage right away. This includes oil or grease spills. What can I do in the bathroom?  Use night lights.  Install grab bars by the toilet and in the tub and shower. Do  not use towel bars as grab bars.  Use non-skid mats or decals in the tub or shower.  If you need to sit down in the shower, use a plastic, non-slip stool.  Keep the floor dry. Clean up any water that spills on the floor as soon as it happens.  Remove soap buildup in the tub or shower regularly.  Attach bath mats securely with double-sided non-slip rug tape.  Do not have throw rugs and other things on the floor that can make you trip. What can I do in the bedroom?  Use night lights.  Make sure that you have a light by your bed that is easy to reach.  Do not use any sheets or blankets that are too big for your bed. They should not hang down onto the floor.  Have a firm chair that has side arms. You can use this for support while you get dressed.  Do not have throw rugs and other things on the floor that can make you trip. What can I do in the kitchen?  Clean up any spills right away.  Avoid walking on wet floors.  Keep items that you use a lot in easy-to-reach places.  If you need to reach something above you, use a strong  step stool that has a grab bar.  Keep electrical cords out of the way.  Do not use floor polish or wax that makes floors slippery. If you must use wax, use non-skid floor wax.  Do not have throw rugs and other things on the floor that can make you trip. What can I do with my stairs?  Do not leave any items on the stairs.  Make sure that there are handrails on both sides of the stairs and use them. Fix handrails that are broken or loose. Make sure that handrails are as long as the stairways.  Check any carpeting to make sure that it is firmly attached to the stairs. Fix any carpet that is loose or worn.  Avoid having throw rugs at the top or bottom of the stairs. If you do have throw rugs, attach them to the floor with carpet tape.  Make sure that you have a light switch at the top of the stairs and the bottom of the stairs. If you do not have them,  ask someone to add them for you. What else can I do to help prevent falls?  Wear shoes that:  Do not have high heels.  Have rubber bottoms.  Are comfortable and fit you well.  Are closed at the toe. Do not wear sandals.  If you use a stepladder:  Make sure that it is fully opened. Do not climb a closed stepladder.  Make sure that both sides of the stepladder are locked into place.  Ask someone to hold it for you, if possible.  Clearly mark and make sure that you can see:  Any grab bars or handrails.  First and last steps.  Where the edge of each step is.  Use tools that help you move around (mobility aids) if they are needed. These include:  Canes.  Walkers.  Scooters.  Crutches.  Turn on the lights when you go into a dark area. Replace any light bulbs as soon as they burn out.  Set up your furniture so you have a clear path. Avoid moving your furniture around.  If any of your floors are uneven, fix them.  If there are any pets around you, be aware of where they are.  Review your medicines with your doctor. Some medicines can make you feel dizzy. This can increase your chance of falling. Ask your doctor what other things that you can do to help prevent falls. This information is not intended to replace advice given to you by your health care provider. Make sure you discuss any questions you have with your health care provider. Document Released: 05/10/2009 Document Revised: 12/20/2015 Document Reviewed: 08/18/2014 Elsevier Interactive Patient Education  2017 [employer].

## 2020-07-12 NOTE — Progress Notes (Signed)
Subjective:   Sara Andrews is a 64 y.o. female who presents for an Initial Medicare Annual Wellness Visit.  Virtual Visit via Telephone Note  I connected with  Sara Andrews on 07/12/20 at  8:40 AM EST by telephone and verified that I am speaking with the correct person using two identifiers.  Location: Patient: home Provider: [facility] Persons participating in the virtual visit: patient/Nurse Health Advisor   I discussed the limitations, risks, security and privacy concerns of performing an evaluation and management service by telephone and the availability of in person appointments. The patient expressed understanding and agreed to proceed.  Interactive audio and video telecommunications were attempted between this nurse and patient, however failed, due to patient having technical difficulties OR patient did not have access to video capability.  We continued and completed visit with audio only.  Some vital signs may be absent or patient reported.   Reather LittlerKasey Keionna Kinnaird, LPN    Review of Systems     Cardiac Risk Factors include: advanced age (>[id number], 85>65 women)     Objective:    There were no vitals filed for this visit. There is no height or weight on file to calculate BMI.  Advanced Directives 07/12/2020 03/20/2020 04/28/2017 01/26/2017 11/06/2016 03/10/2016 02/01/2016  Does Patient Have a Medical Advance Directive? No No No No No No No  Would patient like information on creating a medical advance directive? Yes (MAU/Ambulatory/Procedural Areas - Information given) - - - - No - patient declined information No - patient declined information  Some encounter information is confidential and restricted. Go to Review Flowsheets activity to see all data.    Current Medications (verified) Outpatient Encounter Medications as of 07/12/2020  Medication Sig  . albuterol (PROVENTIL) (2.5 MG/3ML) 0.083% nebulizer solution Take 3 mLs (2.5 mg total) by nebulization every 6 (six) hours as  needed for wheezing or shortness of breath.  Marland Kitchen. aspirin EC 81 MG tablet Take 1 tablet (81 mg total) by mouth daily.  Marland Kitchen. buPROPion (WELLBUTRIN XL) 150 MG 24 hr tablet Take 1 tablet (150 mg total) by mouth daily.  . Cholecalciferol (VITAMIN D PO) Take 1 capsule by mouth daily. 1000 IU  . DULoxetine (CYMBALTA) 60 MG capsule Take 1 capsule (60 mg total) by mouth daily.  . fluticasone (FLONASE) 50 MCG/ACT nasal spray SHAKE LIQUID AND USE 2 SPRAYS IN EACH NOSTRIL DAILY  . levothyroxine (SYNTHROID) 75 MCG tablet TAKE 1 TABLET(75 MCG) BY MOUTH DAILY  . lidocaine (XYLOCAINE) 5 % ointment Apply 1 application topically as needed.  . Magnesium Oxide 400 MG CAPS Take 1 capsule (400 mg total) by mouth once.  . Melatonin 5 MG CAPS Take 1 capsule by mouth daily. Pt taking 10 mg qhs  . meloxicam (MOBIC) 15 MG tablet TAKE 1 TABLET(15 MG) BY MOUTH DAILY  . montelukast (SINGULAIR) 10 MG tablet Take 1 tablet (10 mg total) by mouth at bedtime.  Marland Kitchen. omeprazole (PRILOSEC) 40 MG capsule TAKE ONE CAPSULE BY MOUTH DAILY  . ondansetron (ZOFRAN) 4 MG tablet Take 1 tablet (4 mg total) by mouth every 8 (eight) hours as needed for nausea or vomiting.  . promethazine-codeine (PHENERGAN WITH CODEINE) 6.25-10 MG/5ML syrup Take 5 mLs by mouth at bedtime.  . rosuvastatin (CRESTOR) 20 MG tablet Take 1 tablet (20 mg total) by mouth daily.  . SUMAtriptan (IMITREX) 50 MG tablet May repeat in 2 hours if headache persists or recurs. Do not take more than 2 in 24 hours.  . traZODone (DESYREL) 100  MG tablet Take 1-1.5 tablets (100-150 mg total) by mouth at bedtime.  . TRELEGY ELLIPTA 100-62.5-25 MCG/INH AEPB INHALE 1 PUFF INTO THE LUNGS DAILY  . triamcinolone cream (KENALOG) 0.1 % Apply 1 application topically 2 (two) times daily.  . benzonatate (TESSALON) 100 MG capsule Take 1-2 capsules (100-200 mg total) by mouth 2 (two) times daily as needed. (Patient not taking: Reported on 07/12/2020)  . diclofenac Sodium (VOLTAREN) 1 % GEL Apply  topically 4 (four) times daily. (Patient not taking: Reported on 07/12/2020)  . Misc. Devices (PULSE OXIMETER) MISC 1 each by Does not apply route 4 (four) times daily.  . pregabalin (LYRICA) 50 MG capsule Take 1 capsule (50 mg total) by mouth 3 (three) times daily. (Patient not taking: Reported on 07/12/2020)  . [DISCONTINUED] amoxicillin-clavulanate (AUGMENTIN) 875-125 MG tablet Take 1 tablet by mouth 2 (two) times daily.  . [DISCONTINUED] baclofen (LIORESAL) 10 MG tablet Take 1 tablet by mouth daily as needed.  . [DISCONTINUED] metoprolol tartrate (LOPRESSOR) 100 MG tablet Take 1 tablet (100 mg total) by mouth once for 1 dose. Take 2 hours prior to the CT.  . [DISCONTINUED] traMADol (ULTRAM) 50 MG tablet Take 50 mg by mouth every 8 (eight) hours as needed.   No facility-administered encounter medications on file as of 07/12/2020.    Allergies (verified) Patient has no known allergies.   History: Past Medical History:  Diagnosis Date  . Anxiety   . Arthritis    "hips, hands, back" (03/10/2016)  . Asthma   . Chronic bronchitis (HCC)   . COPD (chronic obstructive pulmonary disease) (HCC)   . Cough   . Depression   . Esophagitis, reflux   . GERD (gastroesophageal reflux disease)   . Hyperlipidemia   . Hypothyroidism   . IBS (irritable bowel syndrome)   . Lumbago   . Muscle pain   . Osteoarthritis   . Sinus disorder   . Thyroid disease   . Type 2 diabetes, diet controlled (HCC)   . Uncomplicated herpes simplex    Past Surgical History:  Procedure Laterality Date  . ABDOMINAL HYSTERECTOMY    . CARPAL TUNNEL RELEASE Right   . LUMBAR LAMINECTOMY  03/16/2018   L4-5 Laminectomy & Fusion w/ Pedicle Screws, TLIF & Allograft; Surgeon: Virl Diamond, MD; Location: [facility] MAIN OR; Service: Orthopedics; Laterality: N/A; Prone, ProAxis, Stim Neuromonitoring, O-Arm, Cell Saver, [employer] [place], [employer], Justin, PACS on [facility]    . OOPHORECTOMY    . ORIF TIBIA PLATEAU Left 03/10/2016    Procedure: OPEN REDUCTION INTERNAL FIXATION (ORIF) BICONDYLAR TIBIAL PLATEAU FRACTURE;  Surgeon: Eldred Manges, MD;  Location: [facility] OR;  Service: Orthopedics;  Laterality: Left;  . SHOULDER ARTHROSCOPY W/ ROTATOR CUFF REPAIR Right   . TONSILLECTOMY AND ADENOIDECTOMY     Family History  Problem Relation Age of Onset  . Hypertension Daughter   . Breast cancer Daughter   . Diabetes Father   . Heart disease Father   . Breast cancer Maternal Aunt 60  . Breast cancer Paternal Aunt 61  . Depression Sister   . Alcohol abuse Sister   . Anxiety disorder Sister   . Bipolar disorder Sister   . Pneumonia Sister   . Skin cancer Daughter   . Anxiety disorder Daughter    Social History   Socioeconomic History  . Marital status: Widowed    Spouse name: Not on file  . Number of children: 3  . Years of education: Not on file  . Highest education  level: 10th grade  Occupational History  . Occupation: disabled    Comment: retired  Tobacco Use  . Smoking status: Former Smoker    Packs/day: 2.00    Years: 30.00    Pack years: 60.00    Quit date: 09/03/2008    Years since quitting: 11.8  . Smokeless tobacco: Never Used  Vaping Use  . Vaping Use: Never used  Substance and Sexual Activity  . Alcohol use: No  . Drug use: No  . Sexual activity: Never  Other Topics Concern  . Not on file  Social History Narrative   She used to work at  [employer], but had a left knee work related injury 03/10/2016 , require left knee surgery and is now having back pain, that is getting evaluated, Out of work since.       Patient recently found out that one of her twins [name]) was diagnosed with breast cancer); will be having a double mastectomy soon.      Pt lives alone.    Social Determinants of Health   Financial Resource Strain: Low Risk   . Difficulty of Paying Living Expenses: Not hard at all  Food Insecurity: No Food Insecurity  . Worried About Programme researcher, broadcasting/film/video in the Last Year: Never true  .  Ran Out of Food in the Last Year: Never true  Transportation Needs: No Transportation Needs  . Lack of Transportation (Medical): No  . Lack of Transportation (Non-Medical): No  Physical Activity: Inactive  . Days of Exercise per Week: 0 days  . Minutes of Exercise per Session: 0 min  Stress: Stress Concern Present  . Feeling of Stress : To some extent  Social Connections: Socially Integrated  . Frequency of Communication with Friends and Family: More than three times a week  . Frequency of Social Gatherings with Friends and Family: More than three times a week  . Attends Religious Services: More than 4 times per year  . Active Member of Clubs or Organizations: Yes  . Attends Banker Meetings: More than 4 times per year  . Marital Status: Married    Tobacco Counseling Counseling given: Not Answered   Clinical Intake:  Pre-visit preparation completed: Yes  Pain : No/denies pain     Nutritional Risks: None Diabetes: Yes CBG done?: No Did pt. bring in CBG monitor from home?: No  How often do you need to have someone help you when you read instructions, pamphlets, or other written materials from your doctor or pharmacy?: 1 - Never  Nutrition Risk Assessment:  Has the patient had any N/V/D within the last 2 months?  No  Does the patient have any non-healing wounds?  No  Has the patient had any unintentional weight loss or weight gain?  No   Diabetes:  Is the patient diabetic?  Yes  If diabetic, was a CBG obtained today?  No  Did the patient bring in their glucometer from home?  No  How often do you monitor your CBG's? Pt does not actively check blood sugars.   Financial Strains and Diabetes Management:  Are you having any financial strains with the device, your supplies or your medication? No .  Does the patient want to be seen by Chronic Care Management for management of their diabetes?  No  Would the patient like to be referred to a Nutritionist or for  Diabetic Management?  No   Diabetic Exams:  Diabetic Eye Exam: Completed 10/11/19 negative retinopathy Dr. Renaee Munda.  Diabetic Foot Exam: Completed 03/09/19. Pt has been advised about the importance in completing this exam. Pt is scheduled for diabetic foot exam on 09/17/20.   Interpreter Needed?: No  Information entered by :: Reather Littler LPN   Activities of Daily Living In your present state of health, do you have any difficulty performing the following activities: 07/12/2020 05/14/2020  Hearing? N N  Comment declines hearing aids -  Vision? N N  Difficulty concentrating or making decisions? N N  Walking or climbing stairs? Y Y  Dressing or bathing? N N  Doing errands, shopping? N N  Preparing Food and eating ? N -  Using the Toilet? N -  In the past six months, have you accidently leaked urine? N -  Do you have problems with loss of bowel control? N -  Managing your Medications? N -  Managing your Finances? N -  Housekeeping or managing your Housekeeping? N -  Some recent data might be hidden    Patient Care Team: Alba Cory, MD as PCP - General (Family Medicine) Debbe Odea, MD as PCP - Cardiology (Cardiology) Letta Kocher, MD as Consulting Physician (Rehabilitation) Erin Sons, MD (Inactive) as Consulting Physician (Orthopedic Surgery) Jomarie Longs, MD as Consulting Physician (Psychiatry)  Indicate any recent Medical Services you may have received from other than [facility] providers in the past year (date may be approximate).     Assessment:   This is a routine wellness examination for Areonna.  Hearing/Vision screen  Hearing Screening   125Hz  250Hz  500Hz  1000Hz  2000Hz  3000Hz  4000Hz  6000Hz  8000Hz   Right ear:           Left ear:           Comments: Pt denies hearing difficulty  Vision Screening Comments: Annual vision screenings done [facility] Dr. in [facility]  Dietary issues and exercise activities discussed: Current Exercise Habits: The  patient does not participate in regular exercise at present, Exercise limited by: respiratory conditions(s);neurologic condition(s);orthopedic condition(s)  Goals    . Increase physical activity     Recommend increasing physical activity to at least 3 days per week       Depression Screen [facility] 2/9 Scores 07/12/2020 06/05/2020 05/14/2020 05/14/2020 03/23/2020 01/12/2020 08/23/2019  PHQ - 2 Score 0 0 1 1 0 0 3  PHQ- 9 Score - 1 3 - 1 3 4   Exception Documentation - - - - - - -  Not completed - - - - - - -    Fall Risk Fall Risk  07/12/2020 06/05/2020 06/05/2020 05/14/2020 03/23/2020  Falls in the past year? 1 1 0 0 0  Number falls in past yr: 0 0 0 0 0  Injury with Fall? 1 1 0 0 0  Comment - - - - -  Risk for fall due to : History of fall(s) History of fall(s) - - -  Follow up Falls prevention discussed - - - -    FALL RISK PREVENTION PERTAINING TO THE HOME:  Any stairs in or around the home? Yes  If so, are there any without handrails? No  Home free of loose throw rugs in walkways, pet beds, electrical cords, etc? Yes  Adequate lighting in your home to reduce risk of falls? Yes   ASSISTIVE DEVICES UTILIZED TO PREVENT FALLS:  Life alert? No  Use of a cane, walker or w/c? No  Grab bars in the bathroom? Yes  Shower chair or bench in shower? Yes  Elevated toilet seat or a  handicapped toilet? No   TIMED UP AND GO:  Was the test performed? No . Telephonic visit.   Cognitive Function: Normal cognitive status assessed by direct observation by this Nurse Health Advisor. No abnormalities found.          Immunizations Immunization History  Administered Date(s) Administered  . Fluad Quad(high Dose 65+) 05/14/2020  . Influenza Inj Mdck Quad Pf 05/07/2019  . Influenza,inj,Quad PF,6+ Mos 04/28/2017, 04/12/2018  . Influenza-Unspecified 05/24/2015, 06/02/2016  . PFIZER SARS-COV-2 Vaccination 09/24/2019, 10/22/2019  . Pneumococcal Conjugate-13 04/28/2017  . Pneumococcal  Polysaccharide-23 06/26/2015, 07/12/2019  . Tdap 04/28/2017  . Zoster Recombinat (Shingrix) 03/16/2019    TDAP status: Up to date  Flu Vaccine status: Up to date  Pneumococcal vaccine status: Up to date  Covid-19 vaccine status: Completed vaccines  Qualifies for Shingles Vaccine? Yes   Zostavax completed Yes   Shingrix Completed?: Yes  Screening Tests Health Maintenance  Topic Date Due  . FOOT EXAM  03/08/2020  . URINE MICROALBUMIN  03/10/2020  . COVID-19 Vaccine (3 - Booster for Pfizer series) 04/23/2020  . OPHTHALMOLOGY EXAM  10/10/2020  . HEMOGLOBIN A1C  11/12/2020  . Fecal DNA (Cologuard)  07/22/2021  . MAMMOGRAM  08/10/2021  . TETANUS/TDAP  04/29/2027  . INFLUENZA VACCINE  Completed  . DEXA SCAN  Completed  . Hepatitis C Screening  Completed  . PNA vac Low Risk Adult  Completed    Health Maintenance  Health Maintenance Due  Topic Date Due  . FOOT EXAM  03/08/2020  . URINE MICROALBUMIN  03/10/2020  . COVID-19 Vaccine (3 - Booster for Pfizer series) 04/23/2020    Colorectal cancer screening: Type of screening: Cologuard. Completed 07/22/18. Repeat every 3 years  Mammogram status: Completed 08/11/19. Repeat every year. Ordered today.   Bone Density status: Completed 08/11/19. Results reflect: Bone density results: OSTEOPOROSIS. Repeat every 2 years.  Lung Cancer Screening: (Low Dose CT Chest recommended if Age 47-80 years, 30 pack-year currently smoking OR have quit w/in 15years.) does qualify.   Additional Screening:  Hepatitis C Screening: does qualify; Completed 01/21/17  Vision Screening: Recommended annual ophthalmology exams for early detection of glaucoma and other disorders of the eye. Is the patient up to date with their annual eye exam?  Yes  Who is the provider or what is the name of the office in which the patient attends annual eye exams? Dr. Renaee Munda  Dental Screening: Recommended annual dental exams for proper oral hygiene  Community Resource  Referral / Chronic Care Management: CRR required this visit?  No   CCM required this visit?  No      Plan:     I have personally reviewed and noted the following in the patient's chart:   . Medical and social history . Use of alcohol, tobacco or illicit drugs  . Current medications and supplements . Functional ability and status . Nutritional status . Physical activity . Advanced directives . List of other physicians . Hospitalizations, surgeries, and ER visits in previous 12 months . Vitals . Screenings to include cognitive, depression, and falls . Referrals and appointments  In addition, I have reviewed and discussed with patient certain preventive protocols, quality metrics, and best practice recommendations. A written personalized care plan for preventive services as well as general preventive health recommendations were provided to patient.     Reather Littler, LPN   [id number]/04/[id number]   Nurse Notes: pt states she has had persistent cough for the last 4-5 weeks even after completing antibiotics. Pt  is still using promethazine with codeine PRN and using albuterol nebulizer solution and daily trelegy.   Pt also c/o difficulty staying asleep even with trazodone and melatonin.  She reports occasional cramps in legs and feet.   Pt requests rx for proair rescue inhaler and pt advised to contact office for appt if sxs worsen or persist. Pt denies fever, sore throat, etc, only cough.

## 2020-08-09 ENCOUNTER — Other Ambulatory Visit: Payer: Self-pay | Admitting: Family Medicine

## 2020-08-09 DIAGNOSIS — E785 Hyperlipidemia, unspecified: Secondary | ICD-10-CM

## 2020-08-09 DIAGNOSIS — I700 Atherosclerosis of aorta: Secondary | ICD-10-CM

## 2020-08-21 DIAGNOSIS — M25512 Pain in left shoulder: Secondary | ICD-10-CM | POA: Diagnosis not present

## 2020-08-21 DIAGNOSIS — M19012 Primary osteoarthritis, left shoulder: Secondary | ICD-10-CM | POA: Diagnosis not present

## 2020-09-06 ENCOUNTER — Other Ambulatory Visit: Payer: Self-pay | Admitting: Family Medicine

## 2020-09-06 DIAGNOSIS — Z87891 Personal history of nicotine dependence: Secondary | ICD-10-CM

## 2020-09-14 DIAGNOSIS — M7582 Other shoulder lesions, left shoulder: Secondary | ICD-10-CM | POA: Diagnosis not present

## 2020-09-14 DIAGNOSIS — M7522 Bicipital tendinitis, left shoulder: Secondary | ICD-10-CM | POA: Diagnosis not present

## 2020-09-14 NOTE — Progress Notes (Signed)
Name: Sara Andrews   MRN: [id number]    DOB: 17-Oct-1955   Date:09/17/2020       Progress Note  Subjective  Chief Complaint  Follow up  HPI   Shingles: she developed flank pain about one week ago, blisters showed up on right flank , she went to [facility] and was diagnosed with shingles August 2021 , she was given Valtrex and oxycodone, during follow up with me she was still in pain and we added Lyrica, she is now off medications , symptoms resolved and had two shingrix vaccines since.   Recurrent headaches: doing well on prn imitrex and zofran, she states not as frequent, she described pain as throbbing, photophobia, eye pain during episodes. Episodes are down to once or twice per month  History of COVID-19 : early January 21  only symptom left is usually a dry cough but sometimes is clear sputum  , explained it may last 3 months.   Senile Purpura: she continues to bruise easily on arms, she is on aspirin, and bleeds at times, discussed USPTF but cardiologist advised her to continue on ASA since she has CAD   DDD andspondylolisthesis:doing well since surgery done 02/2018. She is off pain  medication, taking prn tylenol only and since pain is under control,she used to see Dr. Noel Gerold but not seeing anyone right now, she takes prn baclofen    COPD/asthma : she out of Trelegy for the past month,  she has a cough usually dry but can be productive but no  wheezing, has some sob with moderate activity . We will send a refill to pharmacy, advised her to reach out to me if she runs out of medication   GERD/dyspesia: she states she is doing well now, denies bloating or indigestion, taking Oemprazole and is controlled  Acute left shoulder pain: seen by Ortho recently, given Meloxicam, having daily pain Pain is constant is worse with movement, she has follow at [facility]   Hypothyroidism: She is currently taking 75 mcg daily and half on Sundays, last TSH was at goal . She has gained weight  back, no palpation, she would like to have repeat labs   DM II : diagnosed at work back in 2012she used to take medicationsShe denies polyphagia, polyuria , she has polydipsia. Started on Trulicity June 2018,but she was having worsening ofconstipation and indigestion,sowe switched to Ozempic 10/2018weight was 198 lbs and is down to 132.4lbs.She is off Ozempic since January 21 and weight is up to 160 lbs  lbs. She has been gaining weight gradually since, it is making her very upset , we stopped medication because it was causing nausea. A1C is 5.7 %. No hypoglycemic episodes. She would like to resume Ozempic   Major Depression:  no longer seeing Dr. Elna Breslow but taking medication as prescribed . Phq9 is positive today She needs refills of medication. She had a long history of depression but seems to be responding well to her medication. She is very upset about weight gain, otherwise doing well , we will try resuming Ozempic . She refuses going back to a psychiatrist   Osteoporosis: discussed osteonecrosis of jaw, she is still seeing dentis, we will hold off on medication at this time   Chest pain: intermittently,  she is under the care of  cardiologist. Dr. Azucena Cecil and had EKG and echo that showed normal EF . CT lung had already shown some coronary calcification. Since her first visit with him her chest pain improved, still has  intermittent symptoms. She is on crestor but stopped taking aspirin because it was causing bruises  Senile purpura: reassurance given, better since she stopped taking aspirin   Atherosclerosis Aorta: found on CT done 03/20/2020, on statin therapy   Patient Active Problem List   Diagnosis Date Noted  . Shingles 03/23/2020  . Fire ant bite 03/23/2020  . Burning sensation of lower extremity 03/22/2020  . Centrilobular emphysema (HCC) 01/12/2020  . Senile purpura (HCC) 01/12/2020  . Major depression in remission (HCC) 01/12/2020  . Dyslipidemia associated  with type 2 diabetes mellitus (HCC) 01/12/2020  . Bursitis of hip 08/23/2019  . Carpal tunnel syndrome 08/23/2019  . Thoracic aorta atherosclerosis (HCC) 08/03/2019  . Spondylosis of lumbar spine 11/06/2016  . Spinal stenosis of lumbar region with neurogenic claudication 10/28/2016  . Chronic bilateral low back pain with bilateral sciatica 09/16/2016  . Hyperglycemia 08/07/2016  . Moderate episode of recurrent major depressive disorder (HCC) 08/07/2016  . Pain of left heel 06/02/2016  . Degenerative cervical disc 03/04/2016  . Gastroesophageal reflux disease without esophagitis 02/01/2016  . Primary osteoarthritis of right hand 02/01/2016  . Paresthesias in left hand 02/01/2016  . SOB (shortness of breath) 10/05/2013  . Asthma-COPD overlap syndrome (HCC) 10/05/2013  . History of smoking 10/05/2013  . Hyperlipidemia 10/05/2013  . Adult onset hypothyroidism 10/05/2013    Past Surgical History:  Procedure Laterality Date  . ABDOMINAL HYSTERECTOMY    . CARPAL TUNNEL RELEASE Right   . LUMBAR LAMINECTOMY  03/16/2018   L4-5 Laminectomy & Fusion w/ Pedicle Screws, TLIF & Allograft; Surgeon: Virl DiamondMax William Cohen, MD; Location: [facility] MAIN OR; Service: Orthopedics; Laterality: N/A; Prone, ProAxis, Stim Neuromonitoring, O-Arm, Cell Saver, [employer], [employer], Justin, PACS on [facility]    . OOPHORECTOMY    . ORIF TIBIA PLATEAU Left 03/10/2016   Procedure: OPEN REDUCTION INTERNAL FIXATION (ORIF) BICONDYLAR TIBIAL PLATEAU FRACTURE;  Surgeon: Eldred MangesMark C Yates, MD;  Location: [facility] OR;  Service: Orthopedics;  Laterality: Left;  . SHOULDER ARTHROSCOPY W/ ROTATOR CUFF REPAIR Right   . TONSILLECTOMY AND ADENOIDECTOMY      Family History  Problem Relation Age of Onset  . Hypertension Daughter   . Breast cancer Daughter   . Diabetes Father   . Heart disease Father   . Breast cancer Maternal Aunt 60  . Breast cancer Paternal Aunt [id number]  . Depression Sister   . Alcohol abuse Sister   . Anxiety disorder Sister   .  Bipolar disorder Sister   . Pneumonia Sister   . Skin cancer Daughter   . Anxiety disorder Daughter     Social History   Tobacco Use  . Smoking status: Former Smoker    Packs/day: 2.00    Years: 30.00    Pack years: 60.00    Quit date: 09/03/2008    Years since quitting: 12.0  . Smokeless tobacco: Never Used  Substance Use Topics  . Alcohol use: No     Current Outpatient Medications:  .  Cholecalciferol (VITAMIN D PO), Take 1 capsule by mouth daily. 1000 IU, Disp: , Rfl:  .  fluticasone (FLONASE) 50 MCG/ACT nasal spray, SHAKE LIQUID AND USE 2 SPRAYS IN EACH NOSTRIL DAILY, Disp: 48 g, Rfl: 0 .  hydrOXYzine (ATARAX/VISTARIL) 10 MG tablet, Take 1-2 tablets (10-20 mg total) by mouth at bedtime as needed. Stop trazodone, Disp: 60 tablet, Rfl: 0 .  levothyroxine (SYNTHROID) 75 MCG tablet, TAKE 1 TABLET(75 MCG) BY MOUTH DAILY, Disp: 90 tablet, Rfl: 0 .  lidocaine (XYLOCAINE)  5 % ointment, Apply 1 application topically as needed., Disp: 240 g, Rfl: 0 .  Melatonin 5 MG CAPS, Take 1 capsule by mouth daily. Pt taking 10 mg qhs, Disp: , Rfl:  .  meloxicam (MOBIC) 15 MG tablet, TAKE 1 TABLET(15 MG) BY MOUTH DAILY, Disp: 30 tablet, Rfl: 0 .  Misc. Devices (PULSE OXIMETER) MISC, 1 each by Does not apply route 4 (four) times daily., Disp: 1 each, Rfl: 0 .  Semaglutide,0.25 or 0.5MG /DOS, (OZEMPIC, 0.25 OR 0.5 MG/DOSE,) 2 MG/1.5ML SOPN, Inject 0.25-5 mg into the skin once a week., Disp: 1.5 mL, Rfl: 0 .  triamcinolone cream (KENALOG) 0.1 %, Apply 1 application topically 2 (two) times daily., Disp: 30 g, Rfl: 0 .  albuterol (PROVENTIL) (2.5 MG/3ML) 0.083% nebulizer solution, Take 3 mLs (2.5 mg total) by nebulization every 6 (six) hours as needed for wheezing or shortness of breath. (Patient not taking: Reported on 09/17/2020), Disp: 50 mL, Rfl: 1 .  albuterol (VENTOLIN HFA) 108 (90 Base) MCG/ACT inhaler, Inhale 2 puffs into the lungs every 6 (six) hours as needed for wheezing or shortness of breath.,  Disp: 8 g, Rfl: 0 .  buPROPion (WELLBUTRIN XL) 150 MG 24 hr tablet, Take 1 tablet (150 mg total) by mouth daily., Disp: 90 tablet, Rfl: 1 .  DULoxetine (CYMBALTA) 60 MG capsule, Take 1 capsule (60 mg total) by mouth daily., Disp: 90 capsule, Rfl: 0 .  Fluticasone-Umeclidin-Vilant (TRELEGY ELLIPTA) 100-62.5-25 MCG/INH AEPB, Inhale 1 puff into the lungs daily., Disp: 180 each, Rfl: 1 .  montelukast (SINGULAIR) 10 MG tablet, Take 1 tablet (10 mg total) by mouth at bedtime., Disp: 90 tablet, Rfl: 1 .  omeprazole (PRILOSEC) 40 MG capsule, TAKE ONE CAPSULE BY MOUTH DAILY, Disp: 90 capsule, Rfl: 1 .  rosuvastatin (CRESTOR) 20 MG tablet, Take 1 tablet (20 mg total) by mouth daily., Disp: 90 tablet, Rfl: 1 .  SUMAtriptan (IMITREX) 50 MG tablet, May repeat in 2 hours if headache persists or recurs. Do not take more than 2 in 24 hours., Disp: 10 tablet, Rfl: 0  No Known Allergies  I personally reviewed active problem list, medication list, allergies, family history, social history, health maintenance with the patient/caregiver today.   ROS  Constitutional: Negative for fever , positive for  weight change.  Respiratory: positive for cough and intermittent  shortness of breath.   Cardiovascular: Negative for chest pain or palpitations.  Gastrointestinal: Negative for abdominal pain, no bowel changes.  Musculoskeletal: Negative for gait problem or joint swelling.  Skin: Negative for rash.  Neurological: Negative for dizziness , positive for intermittent  headache.  No other specific complaints in a complete review of systems (except as listed in HPI above).  Objective  Vitals:   09/17/20 1307  BP: 124/78  Pulse: 90  Resp: 16  Temp: 98 F (36.7 C)  TempSrc: Oral  SpO2: 97%  Weight: 160 lb 11.2 oz (72.9 kg)  Height:  (1.448 m)    Body mass index is 34.78 kg/m.  Physical Exam  Constitutional: Patient appears well-developed and well-nourished. Overweight.  No distress.  HEENT: head  atraumatic, normocephalic, pupils equal and reactive to light,neck supple Cardiovascular: Normal rate, regular rhythm and normal heart sounds.  No murmur heard. No BLE edema. Pulmonary/Chest: Effort normal and breath sounds normal. No respiratory distress. Abdominal: Soft.  There is no tenderness. Psychiatric: Patient has a normal mood and affect. behavior is normal. Judgment and thought content normal.  Recent Results (from the past 2160 hour(s))  POCT HgB A1C     Status: Abnormal   Collection Time: 09/17/20  1:11 PM  Result Value Ref Range   Hemoglobin A1C 5.7 (A) 4.0 - 5.6 %   HbA1c POC (<> result, manual entry)     HbA1c, POC (prediabetic range)     HbA1c, POC (controlled diabetic range)      Diabetic Foot Exam: Diabetic Foot Exam - Simple   Simple Foot Form Diabetic Foot exam was performed with the following findings: Yes 09/17/2020  1:45 PM  Visual Inspection No deformities, no ulcerations, no other skin breakdown bilaterally: Yes Sensation Testing Intact to touch and monofilament testing bilaterally: Yes Pulse Check Posterior Tibialis and Dorsalis pulse intact bilaterally: Yes Comments     PHQ2/9: Depression screen [facility] 2/9 09/17/2020 07/12/2020 06/05/2020 05/14/2020 05/14/2020  Decreased Interest 2 0 0 0 0  Down, Depressed, Hopeless 0 0 0 1 1  PHQ - 2 Score 2 0 0 1 1  Altered sleeping 2 - 1 1 -  Tired, decreased energy 2 - 0 0 -  Change in appetite 0 - 0 0 -  Feeling bad or failure about yourself  0 - 0 1 -  Trouble concentrating 0 - 0 0 -  Moving slowly or fidgety/restless 0 - 0 0 -  Suicidal thoughts 0 - 0 0 -  PHQ-9 Score 6 - 1 3 -  Difficult doing work/chores - - - Not difficult at all -  Some recent data might be hidden    phq 9 is positive   Fall Risk: Fall Risk  09/17/2020 07/12/2020 06/05/2020 06/05/2020 05/14/2020  Falls in the past year? 1 1 1  0 0  Number falls in past yr: 0 0 0 0 0  Injury with Fall? 0 1 1 0 0  Comment - - - - -  Risk for fall due to  : - History of fall(s) History of fall(s) - -  Follow up - Falls prevention discussed - - -     Functional Status Survey: Is the patient deaf or have difficulty hearing?: No Does the patient have difficulty seeing, even when wearing glasses/contacts?: No Does the patient have difficulty concentrating, remembering, or making decisions?: No Does the patient have difficulty walking or climbing stairs?: No Does the patient have difficulty dressing or bathing?: No Does the patient have difficulty doing errands alone such as visiting a doctor's office or shopping?: No    Assessment & Plan  1. Major depression in remission (HCC)  - buPROPion (WELLBUTRIN XL) 150 MG 24 hr tablet; Take 1 tablet (150 mg total) by mouth daily.  Dispense: 90 tablet; Refill: 1 - DULoxetine (CYMBALTA) 60 MG capsule; Take 1 capsule (60 mg total) by mouth daily.  Dispense: 90 capsule; Refill: 0  2. Asthma-COPD overlap syndrome (HCC)  - montelukast (SINGULAIR) 10 MG tablet; Take 1 tablet (10 mg total) by mouth at bedtime.  Dispense: 90 tablet; Refill: 1 - Fluticasone-Umeclidin-Vilant (TRELEGY ELLIPTA) 100-62.5-25 MCG/INH AEPB; Inhale 1 puff into the lungs daily.  Dispense: 180 each; Refill: 1  3. Gastroesophageal reflux disease without esophagitis  - omeprazole (PRILOSEC) 40 MG capsule; TAKE ONE CAPSULE BY MOUTH DAILY  Dispense: 90 capsule; Refill: 1  4. Thoracic aorta atherosclerosis (HCC)  - rosuvastatin (CRESTOR) 20 MG tablet; Take 1 tablet (20 mg total) by mouth daily.  Dispense: 90 tablet; Refill: 1  5. Dyslipidemia  - rosuvastatin (CRESTOR) 20 MG tablet; Take 1 tablet (20 mg total) by mouth daily.  Dispense: 90 tablet; Refill:  1 - Lipid panel  6. Migraine without aura and without status migrainosus, not intractable  - SUMAtriptan (IMITREX) 50 MG tablet; May repeat in 2 hours if headache persists or recurs. Do not take more than 2 in 24 hours.  Dispense: 10 tablet; Refill: 0  7. Insomnia due to mental  disorder  She states not working anymore, feeling very stressed, scratching her scalp, we will try hydroxizine instead   8. Dyslipidemia associated with type 2 diabetes mellitus (HCC)  - POCT HgB A1C - HM Diabetes Foot Exam - Microalbumin / creatinine urine ratio - Semaglutide,0.25 or 0.5MG /DOS, (OZEMPIC, 0.25 OR 0.5 MG/DOSE,) 2 MG/1.5ML SOPN; Inject 0.25-5 mg into the skin once a week.  Dispense: 1.5 mL; Refill: 0  9. Senile purpura (HCC)   10. Centrilobular emphysema (HCC)   11. Long-term use of high-risk medication  - COMPLETE METABOLIC PANEL WITH GFR - CBC with Differential/Platelet  12. Age-related osteoporosis without current pathological fracture  - VITAMIN D 25 Hydroxy (Vit-D Deficiency, Fractures)  13. Adult onset hypothyroidism  - TSH  14. Acute pain of left shoulder  Keeping follow up with ortho

## 2020-09-17 ENCOUNTER — Ambulatory Visit (INDEPENDENT_AMBULATORY_CARE_PROVIDER_SITE_OTHER): Payer: Medicare Other | Admitting: Family Medicine

## 2020-09-17 ENCOUNTER — Encounter: Payer: Self-pay | Admitting: Family Medicine

## 2020-09-17 ENCOUNTER — Other Ambulatory Visit: Payer: Self-pay

## 2020-09-17 VITALS — BP 124/78 | HR 90 | Temp 98.0°F | Resp 16 | Ht <= 58 in | Wt 160.7 lb

## 2020-09-17 DIAGNOSIS — J449 Chronic obstructive pulmonary disease, unspecified: Secondary | ICD-10-CM | POA: Diagnosis not present

## 2020-09-17 DIAGNOSIS — D692 Other nonthrombocytopenic purpura: Secondary | ICD-10-CM | POA: Diagnosis not present

## 2020-09-17 DIAGNOSIS — I700 Atherosclerosis of aorta: Secondary | ICD-10-CM | POA: Diagnosis not present

## 2020-09-17 DIAGNOSIS — Z79899 Other long term (current) drug therapy: Secondary | ICD-10-CM

## 2020-09-17 DIAGNOSIS — E785 Hyperlipidemia, unspecified: Secondary | ICD-10-CM | POA: Diagnosis not present

## 2020-09-17 DIAGNOSIS — F5105 Insomnia due to other mental disorder: Secondary | ICD-10-CM

## 2020-09-17 DIAGNOSIS — E038 Other specified hypothyroidism: Secondary | ICD-10-CM | POA: Diagnosis not present

## 2020-09-17 DIAGNOSIS — J432 Centrilobular emphysema: Secondary | ICD-10-CM | POA: Diagnosis not present

## 2020-09-17 DIAGNOSIS — J4489 Other specified chronic obstructive pulmonary disease: Secondary | ICD-10-CM

## 2020-09-17 DIAGNOSIS — F325 Major depressive disorder, single episode, in full remission: Secondary | ICD-10-CM | POA: Diagnosis not present

## 2020-09-17 DIAGNOSIS — K219 Gastro-esophageal reflux disease without esophagitis: Secondary | ICD-10-CM

## 2020-09-17 DIAGNOSIS — M810 Age-related osteoporosis without current pathological fracture: Secondary | ICD-10-CM | POA: Diagnosis not present

## 2020-09-17 DIAGNOSIS — M25512 Pain in left shoulder: Secondary | ICD-10-CM

## 2020-09-17 DIAGNOSIS — G43009 Migraine without aura, not intractable, without status migrainosus: Secondary | ICD-10-CM

## 2020-09-17 DIAGNOSIS — E1169 Type 2 diabetes mellitus with other specified complication: Secondary | ICD-10-CM | POA: Diagnosis not present

## 2020-09-17 DIAGNOSIS — Z8639 Personal history of other endocrine, nutritional and metabolic disease: Secondary | ICD-10-CM

## 2020-09-17 LAB — POCT GLYCOSYLATED HEMOGLOBIN (HGB A1C): Hemoglobin A1C: 5.7 % — AB (ref 4.0–5.6)

## 2020-09-17 MED ORDER — ROSUVASTATIN CALCIUM 20 MG PO TABS: 20.0000 mg | ORAL_TABLET | Freq: Every day | ORAL | 1 refills | Status: DC

## 2020-09-17 MED ORDER — TRELEGY ELLIPTA 100-62.5-25 MCG/INH IN AEPB: 1.0000 | INHALATION_SPRAY | Freq: Every day | RESPIRATORY_TRACT | 1 refills | Status: DC

## 2020-09-17 MED ORDER — OZEMPIC (0.25 OR 0.5 MG/DOSE) 2 MG/1.5ML ~~LOC~~ SOPN: 0.2500 mg | PEN_INJECTOR | SUBCUTANEOUS | 0 refills | Status: DC

## 2020-09-17 MED ORDER — HYDROXYZINE HCL 10 MG PO TABS: 10.0000 mg | ORAL_TABLET | Freq: Every evening | ORAL | 0 refills | Status: DC | PRN

## 2020-09-17 MED ORDER — TRAZODONE HCL 100 MG PO TABS: 100.0000 mg | ORAL_TABLET | Freq: Every evening | ORAL | 1 refills | Status: DC

## 2020-09-17 MED ORDER — SUMATRIPTAN SUCCINATE 50 MG PO TABS: ORAL_TABLET | ORAL | 0 refills | Status: DC

## 2020-09-17 MED ORDER — ALBUTEROL SULFATE HFA 108 (90 BASE) MCG/ACT IN AERS: 2.0000 | INHALATION_SPRAY | Freq: Four times a day (QID) | RESPIRATORY_TRACT | 0 refills | Status: DC | PRN

## 2020-09-17 MED ORDER — DULOXETINE HCL 60 MG PO CPEP: 60.0000 mg | ORAL_CAPSULE | Freq: Every day | ORAL | 0 refills | Status: DC

## 2020-09-17 MED ORDER — BUPROPION HCL ER (XL) 150 MG PO TB24: 150.0000 mg | ORAL_TABLET | Freq: Every day | ORAL | 1 refills | Status: DC

## 2020-09-17 MED ORDER — OMEPRAZOLE 40 MG PO CPDR: DELAYED_RELEASE_CAPSULE | ORAL | 1 refills | Status: DC

## 2020-09-17 MED ORDER — MONTELUKAST SODIUM 10 MG PO TABS: 10.0000 mg | ORAL_TABLET | Freq: Every day | ORAL | 1 refills | Status: DC

## 2020-09-18 LAB — LIPID PANEL
Cholesterol: 124 mg/dL (ref <200)
HDL: 66 mg/dL (ref >=50)
LDL Cholesterol (Calc): 44 mg/dL (calc)
Non-HDL Cholesterol (Calc): 58 mg/dL (calc) (ref <130)
Total CHOL/HDL Ratio: 1.9 (calc) (ref <5.0)
Triglycerides: 55 mg/dL (ref <150)

## 2020-09-18 LAB — COMPLETE METABOLIC PANEL WITH GFR
AG Ratio: 2.1 (calc) (ref 1.0–2.5)
ALT: 19 U/L (ref 6–29)
AST: 19 U/L (ref 10–35)
Albumin: 3.9 g/dL (ref 3.6–5.1)
Alkaline phosphatase (APISO): 65 U/L (ref 37–153)
BUN: 11 mg/dL (ref 7–25)
CO2: 31 mmol/L (ref 20–32)
Calcium: 9.3 mg/dL (ref 8.6–10.4)
Chloride: 106 mmol/L (ref 98–110)
Creat: 0.78 mg/dL (ref 0.50–0.99)
GFR, Est African American: 92 mL/min/{1.73_m2} (ref >=60)
GFR, Est Non African American: 79 mL/min/{1.73_m2} (ref >=60)
Globulin: 1.9 g/dL (calc) (ref 1.9–3.7)
Glucose, Bld: 92 mg/dL (ref 65–99)
Potassium: 3.9 mmol/L (ref 3.5–5.3)
Sodium: 141 mmol/L (ref 135–146)
Total Bilirubin: 0.6 mg/dL (ref 0.2–1.2)
Total Protein: 5.8 g/dL — ABNORMAL LOW (ref 6.1–8.1)

## 2020-09-18 LAB — CBC WITH DIFFERENTIAL/PLATELET
Absolute Monocytes: 369 cells/uL (ref 200–950)
Basophils Absolute: 42 cells/uL (ref 0–200)
Basophils Relative: 0.8 %
Eosinophils Absolute: 291 cells/uL (ref 15–500)
Eosinophils Relative: 5.6 %
HCT: 38.6 % (ref 35.0–45.0)
Hemoglobin: 12.8 g/dL (ref 11.7–15.5)
Lymphs Abs: 1773 cells/uL (ref 850–3900)
MCH: 29.9 pg (ref 27.0–33.0)
MCHC: 33.2 g/dL (ref 32.0–36.0)
MCV: 90.2 fL (ref 80.0–100.0)
MPV: 9.5 fL (ref 7.5–12.5)
Monocytes Relative: 7.1 %
Neutro Abs: 2725 cells/uL (ref 1500–7800)
Neutrophils Relative %: 52.4 %
Platelets: 245 10*3/uL (ref 140–400)
RBC: 4.28 10*6/uL (ref 3.80–5.10)
RDW: 12.7 % (ref 11.0–15.0)
Total Lymphocyte: 34.1 %
WBC: 5.2 10*3/uL (ref 3.8–10.8)

## 2020-09-18 LAB — VITAMIN D 25 HYDROXY (VIT D DEFICIENCY, FRACTURES): Vit D, 25-Hydroxy: 42 ng/mL (ref 30–100)

## 2020-09-18 LAB — MICROALBUMIN / CREATININE URINE RATIO
Creatinine, Urine: 19 mg/dL — ABNORMAL LOW (ref 20–275)
Microalb, Ur: <0.2 mg/dL

## 2020-09-18 LAB — TSH: TSH: 0.93 mIU/L (ref 0.40–4.50)

## 2020-09-21 ENCOUNTER — Other Ambulatory Visit: Payer: Self-pay | Admitting: Family Medicine

## 2020-09-21 DIAGNOSIS — E038 Other specified hypothyroidism: Secondary | ICD-10-CM

## 2020-09-24 ENCOUNTER — Other Ambulatory Visit: Payer: Self-pay | Admitting: Student

## 2020-09-24 ENCOUNTER — Other Ambulatory Visit (HOSPITAL_COMMUNITY): Payer: Self-pay | Admitting: Student

## 2020-09-24 DIAGNOSIS — M7582 Other shoulder lesions, left shoulder: Secondary | ICD-10-CM

## 2020-09-24 DIAGNOSIS — M7522 Bicipital tendinitis, left shoulder: Secondary | ICD-10-CM

## 2020-09-28 ENCOUNTER — Telehealth: Payer: Self-pay | Admitting: Family Medicine

## 2020-09-28 ENCOUNTER — Other Ambulatory Visit: Payer: Self-pay | Admitting: Family Medicine

## 2020-09-28 MED ORDER — TRAZODONE HCL 50 MG PO TABS: 25.0000 mg | ORAL_TABLET | Freq: Every evening | ORAL | 0 refills | Status: DC | PRN

## 2020-09-28 NOTE — Telephone Encounter (Signed)
Patient has been prescribed hydrOXYzine (ATARAX/VISTARIL) 10 MG tablet and feels that it is interfering with their sleep as well as muscle cramps  Patient shares that they have a previous history of muscle cramps but the current experience is different  Patient would like to be contacted regarding an alternative prescriptions  Patient declined to make an appt at the time of call with agent  Please advise

## 2020-09-28 NOTE — Telephone Encounter (Signed)
Please send in trazadone, but does not want to see pschy

## 2020-10-06 ENCOUNTER — Other Ambulatory Visit: Payer: Self-pay

## 2020-10-06 ENCOUNTER — Ambulatory Visit
Admission: RE | Admit: 2020-10-06 | Discharge: 2020-10-06 | Disposition: A | Discharge status: Home / Self Care | Payer: Medicare Other | Source: Ambulatory Visit | Attending: Student | Admitting: Student

## 2020-10-06 DIAGNOSIS — M7522 Bicipital tendinitis, left shoulder: Secondary | ICD-10-CM | POA: Insufficient documentation

## 2020-10-06 DIAGNOSIS — M7552 Bursitis of left shoulder: Secondary | ICD-10-CM | POA: Diagnosis not present

## 2020-10-06 DIAGNOSIS — M7582 Other shoulder lesions, left shoulder: Secondary | ICD-10-CM | POA: Diagnosis not present

## 2020-10-06 DIAGNOSIS — M19012 Primary osteoarthritis, left shoulder: Secondary | ICD-10-CM | POA: Diagnosis not present

## 2020-10-06 DIAGNOSIS — M75102 Unspecified rotator cuff tear or rupture of left shoulder, not specified as traumatic: Secondary | ICD-10-CM | POA: Diagnosis not present

## 2020-10-06 DIAGNOSIS — M25412 Effusion, left shoulder: Secondary | ICD-10-CM | POA: Diagnosis not present

## 2020-10-07 ENCOUNTER — Telehealth: Payer: Self-pay | Admitting: *Deleted

## 2020-10-07 ENCOUNTER — Other Ambulatory Visit: Payer: Self-pay | Admitting: Family Medicine

## 2020-10-07 NOTE — Telephone Encounter (Signed)
Sara Andrews made aware that it is time to schedule her her annual lung cancer screening CT scan. Patient is a former smoker. She stopped smoking cigarettes in 2011. CT scan scheduled for 11/15/2020 at 11:00 am.

## 2020-10-07 NOTE — Telephone Encounter (Signed)
Requested Prescriptions  Pending Prescriptions Disp Refills  . albuterol (VENTOLIN HFA) 108 (90 Base) MCG/ACT inhaler [Pharmacy Med Name: ALBUTEROL HFA INH (200 PUFFS) 6.7GM] 8 g 0    Sig: INHALE 2 PUFFS INTO THE LUNGS EVERY 6 HOURS AS NEEDED FOR WHEEZING OR SHORTNESS OF BREATH     Pulmonology:  Beta Agonists Failed - 10/07/2020 10:50 AM      Failed - One inhaler should last at least one month. If the patient is requesting refills earlier, contact the patient to check for uncontrolled symptoms.      Passed - Valid encounter within last 12 months    Recent Outpatient Visits          2 weeks ago Dyslipidemia associated with type 2 diabetes mellitus [facility])   [facility] Alba Cory, MD   4 months ago COPD with acute exacerbation [facility])   [facility] Alba Cory, MD   4 months ago Senile purpura [facility])   [facility] Alba Cory, MD   6 months ago Herpes zoster without complication   [facility] [place], Danna Hefty, MD   8 months ago Dyslipidemia associated with type 2 diabetes mellitus [facility])   [facility] Alba Cory, MD      Future Appointments            In 2 months Carlynn Purl, Danna Hefty, MD [facility]   In 9 months  [facility]

## 2020-10-08 ENCOUNTER — Other Ambulatory Visit: Payer: Self-pay | Admitting: *Deleted

## 2020-10-08 DIAGNOSIS — Z122 Encounter for screening for malignant neoplasm of respiratory organs: Secondary | ICD-10-CM

## 2020-10-08 DIAGNOSIS — Z87891 Personal history of nicotine dependence: Secondary | ICD-10-CM

## 2020-10-08 NOTE — Progress Notes (Signed)
Contacted and scheduled for annual lung screening scan. Patient is a former smoker, quit 2010, 60 pack year history.

## 2020-10-11 ENCOUNTER — Other Ambulatory Visit: Payer: Self-pay | Admitting: Family Medicine

## 2020-10-11 DIAGNOSIS — E1169 Type 2 diabetes mellitus with other specified complication: Secondary | ICD-10-CM

## 2020-10-11 DIAGNOSIS — F5105 Insomnia due to other mental disorder: Secondary | ICD-10-CM

## 2020-10-12 ENCOUNTER — Ambulatory Visit
Admission: RE | Admit: 2020-10-12 | Discharge: 2020-10-12 | Disposition: A | Discharge status: Home / Self Care | Payer: Medicare Other | Source: Ambulatory Visit | Attending: Family Medicine | Admitting: Family Medicine

## 2020-10-12 ENCOUNTER — Other Ambulatory Visit: Payer: Self-pay

## 2020-10-12 DIAGNOSIS — Z1231 Encounter for screening mammogram for malignant neoplasm of breast: Secondary | ICD-10-CM | POA: Diagnosis not present

## 2020-10-15 DIAGNOSIS — M7582 Other shoulder lesions, left shoulder: Secondary | ICD-10-CM | POA: Diagnosis not present

## 2020-10-15 DIAGNOSIS — M7522 Bicipital tendinitis, left shoulder: Secondary | ICD-10-CM | POA: Diagnosis not present

## 2020-10-15 DIAGNOSIS — M19012 Primary osteoarthritis, left shoulder: Secondary | ICD-10-CM | POA: Diagnosis not present

## 2020-10-29 ENCOUNTER — Ambulatory Visit: Payer: Self-pay | Admitting: *Deleted

## 2020-10-29 NOTE — Telephone Encounter (Signed)
  I returned pt's call.   She is having upper respiratory symptoms, runny nose, productive cough, sinus congestion for the past week.   She has been taking Mucinex DM and a nasal spray but she is not getting better.  Denies fever.  She has asthma/COPD and is a little short of breath but nothing much more than my usual per pt.    I made a MyChart video appt with Danelle Berry, PA since Dr. Carlynn Purl was booked for 10/31/2020 at 1:20.    Covid questionnaire completed.  I sent my notes to [facility] for [place].   Reason for Disposition . [1] Sinus congestion (pressure, fullness) AND [2] present > 10 days  Answer Assessment - Initial Assessment Questions 1. ONSET: "When did the nasal discharge start?"      Last week Sunday started coughing.  Monday a runny nose started.   I'm using nasal spray and Mucinix DM.  Now I'm having yellow mucus coughing and blowing my nose a lot.  No fever 2. AMOUNT: "How much discharge is there?"      I coughing up yellow mucus  3. COUGH: "Do you have a cough?" If yes, ask: "Describe the color of your sputum" (clear, white, yellow, green)     Yes  Coughing up yellow mucus    My sinuses are congested. 4. RESPIRATORY DISTRESS: "Describe your breathing."      Shortness of breath I use allergy medicine and my inhaler.   I have asthma and COPD 5. FEVER: "Do you have a fever?" If Yes, ask: "What is your temperature, how was it measured, and when did it start?"     No 6. SEVERITY: "Overall, how bad are you feeling right now?" (e.g., doesn't interfere with normal activities, staying home from school/work, staying in bed)      No body aches or chills no diarrhea 7. OTHER SYMPTOMS: "Do you have any other symptoms?" (e.g., sore throat, earache, wheezing, vomiting)     No 8. PREGNANCY: "Is there any chance you are pregnant?" "When was your last menstrual period?"     Not asked  Protocols used: COMMON COLD-A-AH

## 2020-10-31 ENCOUNTER — Telehealth (INDEPENDENT_AMBULATORY_CARE_PROVIDER_SITE_OTHER): Payer: Medicare Other | Admitting: Family Medicine

## 2020-10-31 ENCOUNTER — Encounter: Payer: Self-pay | Admitting: Family Medicine

## 2020-10-31 ENCOUNTER — Telehealth: Payer: Self-pay

## 2020-10-31 VITALS — Ht <= 58 in | Wt 148.0 lb

## 2020-10-31 DIAGNOSIS — J069 Acute upper respiratory infection, unspecified: Secondary | ICD-10-CM | POA: Diagnosis not present

## 2020-10-31 DIAGNOSIS — J441 Chronic obstructive pulmonary disease with (acute) exacerbation: Secondary | ICD-10-CM | POA: Diagnosis not present

## 2020-10-31 DIAGNOSIS — J449 Chronic obstructive pulmonary disease, unspecified: Secondary | ICD-10-CM

## 2020-10-31 MED ORDER — BENZONATATE 100 MG PO CAPS: 100.0000 mg | ORAL_CAPSULE | Freq: Three times a day (TID) | ORAL | 0 refills | Status: DC | PRN

## 2020-10-31 MED ORDER — IPRATROPIUM-ALBUTEROL 0.5-2.5 (3) MG/3ML IN SOLN: 3.0000 mL | Freq: Three times a day (TID) | RESPIRATORY_TRACT | 1 refills | Status: DC | PRN

## 2020-10-31 MED ORDER — PREDNISONE 20 MG PO TABS
ORAL_TABLET | ORAL | 0 refills | Status: DC
Start: 2020-10-31 — End: 2020-12-11

## 2020-10-31 MED ORDER — PROMETHAZINE-DM 6.25-15 MG/5ML PO SYRP: 2.5000 mL | ORAL_SOLUTION | Freq: Three times a day (TID) | ORAL | 1 refills | Status: DC | PRN

## 2020-10-31 MED ORDER — LORATADINE 10 MG PO TABS: 10.0000 mg | ORAL_TABLET | Freq: Every day | ORAL | 11 refills | Status: DC

## 2020-10-31 NOTE — Progress Notes (Signed)
Name: Sara Andrews   MRN: [id number]    DOB: 26-Jun-1956   Date:10/31/2020       Progress Note  Subjective:    Chief Complaint  Chief Complaint  Patient presents with  . URI    Congestion, runny nose, cough    I connected with  [name]  on 10/31/20 at  1:20 PM EDT by a only telephone encounter and verified that I am speaking with the correct person using two identifiers.  I discussed the limitations of evaluation and management by telemedicine and the availability of in person appointments. The patient expressed understanding and agreed to proceed. Staff also discussed with the patient that there may be a patient responsible charge related to this service. Patient Location: out picking up granddaughter from school for appt Provider Location: [facility] Additional Individuals present:  None   HPI  Presents with URI -  Sx started last week, sinus sx and coughing, she then started her nasal spray and mucinex-DM, she has taken for a week and her sx still have not improved Her nasal discharge and productive sputum has been yellow in color, around nose feels stuffy/pressure, no severe pain and HA No facial pain, sore throat, fever, chills, sweats, CP, SOB, wheeze   Hx of asthma/COPD overlap, on trelegy and singulair.  She is using inhaler more often and it only helps a little.  Usually gets cough syrup, steroids No hx of hospitalization for exacerbation Not on allergy/antihistamine She thinks its a virus - no known sick contacts     Patient Active Problem List   Diagnosis Date Noted  . Shingles 03/23/2020  . Fire ant bite 03/23/2020  . Burning sensation of lower extremity 03/22/2020  . Centrilobular emphysema (HCC) 01/12/2020  . Senile purpura (HCC) 01/12/2020  . Major depression in remission (HCC) 01/12/2020  . Dyslipidemia associated with type 2 diabetes mellitus (HCC) 01/12/2020  . Bursitis of hip 08/23/2019  . Carpal tunnel syndrome 08/23/2019  .  Thoracic aorta atherosclerosis (HCC) 08/03/2019  . Spondylosis of lumbar spine 11/06/2016  . Spinal stenosis of lumbar region with neurogenic claudication 10/28/2016  . Chronic bilateral low back pain with bilateral sciatica 09/16/2016  . Hyperglycemia 08/07/2016  . Moderate episode of recurrent major depressive disorder (HCC) 08/07/2016  . Pain of left heel 06/02/2016  . Degenerative cervical disc 03/04/2016  . Gastroesophageal reflux disease without esophagitis 02/01/2016  . Primary osteoarthritis of right hand 02/01/2016  . Paresthesias in left hand 02/01/2016  . SOB (shortness of breath) 10/05/2013  . Asthma-COPD overlap syndrome (HCC) 10/05/2013  . History of smoking 10/05/2013  . Hyperlipidemia 10/05/2013  . Adult onset hypothyroidism 10/05/2013    Social History   Tobacco Use  . Smoking status: Former Smoker    Packs/day: 2.00    Years: 30.00    Pack years: 60.00    Quit date: 09/03/2008    Years since quitting: 12.1  . Smokeless tobacco: Never Used  Substance Use Topics  . Alcohol use: No     Current Outpatient Medications:  .  albuterol (VENTOLIN HFA) 108 (90 Base) MCG/ACT inhaler, INHALE 2 PUFFS INTO THE LUNGS EVERY 6 HOURS AS NEEDED FOR WHEEZING OR SHORTNESS OF BREATH, Disp: 8 g, Rfl: 0 .  buPROPion (WELLBUTRIN XL) 150 MG 24 hr tablet, Take 1 tablet (150 mg total) by mouth daily., Disp: 90 tablet, Rfl: 1 .  Cholecalciferol (VITAMIN D PO), Take 1 capsule by mouth daily. 1000 IU, Disp: , Rfl:  .  DULoxetine (CYMBALTA) 60 MG capsule, Take 1 capsule (60 mg total) by mouth daily., Disp: 90 capsule, Rfl: 0 .  fluticasone (FLONASE) 50 MCG/ACT nasal spray, SHAKE LIQUID AND USE 2 SPRAYS IN EACH NOSTRIL DAILY, Disp: 48 g, Rfl: 0 .  Fluticasone-Umeclidin-Vilant (TRELEGY ELLIPTA) 100-62.5-25 MCG/INH AEPB, Inhale 1 puff into the lungs daily., Disp: 180 each, Rfl: 1 .  hydrOXYzine (ATARAX/VISTARIL) 10 MG tablet, Take by mouth., Disp: , Rfl:  .  levothyroxine (SYNTHROID) 75 MCG  tablet, TAKE 1 TABLET(75 MCG) BY MOUTH DAILY, Disp: 90 tablet, Rfl: 3 .  lidocaine (XYLOCAINE) 5 % ointment, Apply 1 application topically as needed., Disp: 240 g, Rfl: 0 .  Melatonin 5 MG CAPS, Take 1 capsule by mouth daily. Pt taking 10 mg qhs, Disp: , Rfl:  .  meloxicam (MOBIC) 15 MG tablet, TAKE 1 TABLET(15 MG) BY MOUTH DAILY, Disp: 30 tablet, Rfl: 0 .  Misc. Devices (PULSE OXIMETER) MISC, 1 each by Does not apply route 4 (four) times daily., Disp: 1 each, Rfl: 0 .  montelukast (SINGULAIR) 10 MG tablet, Take 1 tablet (10 mg total) by mouth at bedtime., Disp: 90 tablet, Rfl: 1 .  omeprazole (PRILOSEC) 40 MG capsule, TAKE ONE CAPSULE BY MOUTH DAILY, Disp: 90 capsule, Rfl: 1 .  OZEMPIC, 0.25 OR 0.5 MG/DOSE, 2 MG/1.5ML SOPN, INJECT 0.25 OR 0.5 MG INTO THE SKIN ONCE A WEEK AS DIRECTED, Disp: 1.5 mL, Rfl: 0 .  rosuvastatin (CRESTOR) 20 MG tablet, Take 1 tablet (20 mg total) by mouth daily., Disp: 90 tablet, Rfl: 1 .  SUMAtriptan (IMITREX) 50 MG tablet, May repeat in 2 hours if headache persists or recurs. Do not take more than 2 in 24 hours., Disp: 10 tablet, Rfl: 0 .  traZODone (DESYREL) 50 MG tablet, Take 0.5-1 tablets (25-50 mg total) by mouth at bedtime as needed for sleep., Disp: 30 tablet, Rfl: 0 .  triamcinolone cream (KENALOG) 0.1 %, Apply 1 application topically 2 (two) times daily., Disp: 30 g, Rfl: 0 .  albuterol (PROVENTIL) (2.5 MG/3ML) 0.083% nebulizer solution, Take 3 mLs (2.5 mg total) by nebulization every 6 (six) hours as needed for wheezing or shortness of breath. (Patient not taking: No sig reported), Disp: 50 mL, Rfl: 1  No Known Allergies  I personally reviewed active problem list, medication list, allergies, family history, social history, health maintenance, notes from last encounter, lab results, imaging with the patient/caregiver today. Reviewed prior meds prescribed by PCP for similar CC in the past, reviewed controlled substance database  Review of Systems  All other  Systems negative for acute change except as noted in the HPI.    Objective:   Telephone encounter, vitals limited, only able to obtain the following Today's Vitals   10/31/20 1108  Weight: 148 lb (67.1 kg)  Height: 4\' 9"  (1.448 m)   Body mass index is 32.03 kg/m. Nursing Note and Vital Signs reviewed.  Physical Exam Vitals and nursing note reviewed.  Pulmonary:     Effort: No respiratory distress.     Comments: No auditory wheeze, stridor, distress or tachypnea, able to speak in full and complete sentences, intermittent coughing. Neurological:     Mental Status: She is alert.  Psychiatric:        Mood and Affect: Mood normal.        Behavior: Behavior normal.     PE limited by telephone encounter  No results found for this or any previous visit (from the past 72 hour(s)).  Assessment and Plan:  ICD-10-CM   1. Asthma-COPD overlap syndrome (HCC)  J44.9 predniSONE (DELTASONE) 20 MG tablet    ipratropium-albuterol (DUONEB) 0.5-2.5 (3) MG/3ML SOLN   mild exacerbation - add antihistamine and cough meds, continue home meds and OTC for sx, prednisone burst, f/up if not improving  2. Upper respiratory tract infection, unspecified type  J06.9 benzonatate (TESSALON) 100 MG capsule    promethazine-dextromethorphan (PROMETHAZINE-DM) 6.25-15 MG/5ML syrup    predniSONE (DELTASONE) 20 MG tablet    ipratropium-albuterol (DUONEB) 0.5-2.5 (3) MG/3ML SOLN    loratadine (CLARITIN) 10 MG tablet   allergic vs viral? add antihistamine to singulair and flonase, continue COPD daily inhaler, no fever, CP, sweats, SOB  3. COPD with acute exacerbation (HCC)  J44.1 benzonatate (TESSALON) 100 MG capsule    promethazine-dextromethorphan (PROMETHAZINE-DM) 6.25-15 MG/5ML syrup    predniSONE (DELTASONE) 20 MG tablet   F/up if not improving in the next 1-2 weeks, f/up sooner if any worsening (could add antibiotics - however talking to her today her sx seem to be improving some just not "going away"  and she has not worsening pain, fever, chills, sweats or DOE.  Did not feel like she needed abx coverage for sinusitis - she mostly wanted controlled substance cough syrup and for symptoms to go away - I explained sx are likely to linger for a while, esp cough, but expect it to gradually improve.  If not she needs to be examined in person or may need some imaging?)    -Red flags and when to present for emergency care or RTC including chest pain, shortness of breath, new/worsening/un-resolving symptoms, reviewed with patient at time of visit. Follow up and care instructions discussed and provided in AVS. - I discussed the assessment and treatment plan with the patient. The patient was provided an opportunity to ask questions and all were answered. The patient agreed with the plan and demonstrated an understanding of the instructions.  I provided 20+ minutes of non-face-to-face time during this encounter.  Danelle Berry, PA-C 10/31/20 1:37 PM

## 2020-10-31 NOTE — Telephone Encounter (Signed)
Copied from CRM [id number]. Topic: General - Other >> Oct 31, 2020  1:41 PM Pawlus, Maxine Glenn A wrote: Reason for CRM: Pt stated she was not able to get on her MyChart for her virtual visit at 1:20pm, pt wanted to know if someone could call her. [phone] . Please advise.

## 2020-11-08 ENCOUNTER — Other Ambulatory Visit: Payer: Self-pay | Admitting: Family Medicine

## 2020-11-11 ENCOUNTER — Other Ambulatory Visit: Payer: Self-pay | Admitting: Family Medicine

## 2020-11-11 DIAGNOSIS — E1169 Type 2 diabetes mellitus with other specified complication: Secondary | ICD-10-CM

## 2020-11-11 NOTE — Telephone Encounter (Signed)
Requested Prescriptions  Pending Prescriptions Disp Refills  . OZEMPIC, 0.25 OR 0.5 MG/DOSE, 2 MG/1.5ML SOPN [Pharmacy Med Name: OZEMPIC 0.25 OR 0.5MG /DOS 1X2MG  PEN] 1.5 mL 0    Sig: INJECT 0.25 OR 0.5 MG INTO THE SKIN ONCE A WEEK AS DIRECTED     Endocrinology:  Diabetes - GLP-1 Receptor Agonists Passed - 11/11/2020  7:04 AM      Passed - HBA1C is between 0 and 7.9 and within 180 days    Hemoglobin A1C  Date Value Ref Range Status  09/17/2020 5.7 (A) 4.0 - 5.6 % Final  01/21/2017 6.0  Final   HbA1c, POC (controlled diabetic range)  Date Value Ref Range Status  08/23/2019 5.5 0.0 - 7.0 % Final         Passed - Valid encounter within last 6 months    Recent Outpatient Visits          1 week ago Asthma-COPD overlap syndrome [facility])   [facility] Danelle Berry, PA-C   1 month ago Dyslipidemia associated with type 2 diabetes mellitus [facility])   [facility] Alba Cory, MD   5 months ago COPD with acute exacerbation [facility])   [facility] Alba Cory, MD   6 months ago Senile purpura [facility])   [facility] Alba Cory, MD   7 months ago Herpes zoster without complication   [facility] Alba Cory, MD      Future Appointments            In 1 month Carlynn Purl, Danna Hefty, MD [facility]   In 8 months  [facility]

## 2020-11-15 ENCOUNTER — Other Ambulatory Visit: Payer: Self-pay

## 2020-11-15 ENCOUNTER — Ambulatory Visit
Admission: RE | Admit: 2020-11-15 | Discharge: 2020-11-15 | Disposition: A | Discharge status: Home / Self Care | Payer: Medicare Other | Source: Ambulatory Visit | Attending: Nurse Practitioner | Admitting: Nurse Practitioner

## 2020-11-15 DIAGNOSIS — Z122 Encounter for screening for malignant neoplasm of respiratory organs: Secondary | ICD-10-CM | POA: Diagnosis not present

## 2020-11-15 DIAGNOSIS — Z87891 Personal history of nicotine dependence: Secondary | ICD-10-CM | POA: Diagnosis not present

## 2020-11-26 ENCOUNTER — Other Ambulatory Visit: Payer: Self-pay | Admitting: Family Medicine

## 2020-11-26 DIAGNOSIS — F5105 Insomnia due to other mental disorder: Secondary | ICD-10-CM

## 2020-11-26 DIAGNOSIS — M7522 Bicipital tendinitis, left shoulder: Secondary | ICD-10-CM | POA: Diagnosis not present

## 2020-11-26 DIAGNOSIS — M7582 Other shoulder lesions, left shoulder: Secondary | ICD-10-CM | POA: Diagnosis not present

## 2020-11-26 DIAGNOSIS — M19012 Primary osteoarthritis, left shoulder: Secondary | ICD-10-CM | POA: Diagnosis not present

## 2020-11-29 ENCOUNTER — Encounter: Payer: Self-pay | Admitting: *Deleted

## 2020-12-10 NOTE — Progress Notes (Signed)
Name: Sara Andrews   MRN: [id number]    DOB: 1955-08-28   Date:12/11/2020       Progress Note  Subjective  Chief Complaint  Follow up   HPI   Recurrent headaches: doing well on prn imitrex and zofran, she states not as frequent, she described pain as throbbing, photophobia, eye pain during episodes. Episodes are down to once a month   DDD andspondylolisthesis:doing well since surgery done 02/2018. She is off pain  medication, taking prn tylenol only and since pain is under control,she used to see Dr. Noel Gerold. She is not taking any  Medications at this time   Leg cramps: only at night, she has to stand up and walk around and it improves, it happens almost every night.    COPD/asthma : she is back on Trelegy, she rinses her mouth after she uses it. She has a daily cough when talking a lot, also when wearing a mask. No wheezing or sob lately. She uses rescue inhaler at most once or twice a week   Recurrent scalp pruritis: she states like her fever blisters and improved with valtrex , she would like a refill   GERD/dyspesia: she states she is doing well when off Ozempic, but since we resumed medication in Feb she has noticed bloating, intermittent nausea but no pain. She asked for nausea medication, but advised to go down to 0.25 mg weekly to see if symptoms will improve   Acute left shoulder pain: seen by Ortho recently, off Meloxicam now , having daily pain Pain is constant is worse with movement, she was seen at [facility] and had steroid injection. She has noticed new left hip pain/groin and left knee pain ( previous surgery) over the past week, advised to discuss it with ortho during her follow up visit   Hypothyroidism: She is currently taking 75 mcg daily and half on Mondays, last TSH was at goal . She lost weight since last visit by choice, no hair loss or dry skin   DM II : diagnosed at work back in 2012she used to take medicationsShe denies polyphagia, polyuria ,  she has polydipsia. Started on Trulicity June 2018,but she was having worsening ofconstipation and indigestion,sowe switched to Ozempic 10/2018weight was 198 lbs and is down to 132.4lbs.She was of Ozempic since January 21 and weight went up to 160 lbs  lbs. She asked to resume medication even though it caused nausea and bloating, that she has developed again, . A1C is 5.7 %. No hypoglycemic episodes. Advised to go down on dose to 0.25 mg to see if nausea improves   Major Depression:  no longer seeing Dr. Elna Breslow but taking medication as prescribed . Phq9 is positive today She is on Wellbutrin and Duloxetine, takes trazodone for sleep . She was very upset with weight gain, so we placed her back on Ozempic and she has lost weight.   Osteoporosis: discussed osteonecrosis of jaw, she is still seeing dentis, we will hold off on medication at this time . Unchanged   Chest pain: intermittently and states not as often now,  she is under the care of  cardiologist. Dr. Azucena Cecil and had EKG and echo that showed normal EF . CT lung had already shown some coronary calcification. She is compliant with crestor daily   Senile purpura: reassurance given, still has it even though she stopped taking aspirin   Atherosclerosis Aorta: found on CT done 03/20/2020, on statin therapy . Unchanged   Patient Active Problem List  Diagnosis Date Noted  . History of shingles 03/23/2020  . Burning sensation of lower extremity 03/22/2020  . Centrilobular emphysema (HCC) 01/12/2020  . Senile purpura (HCC) 01/12/2020  . Major depression in remission (HCC) 01/12/2020  . Dyslipidemia associated with type 2 diabetes mellitus (HCC) 01/12/2020  . Bursitis of hip 08/23/2019  . Carpal tunnel syndrome 08/23/2019  . Thoracic aorta atherosclerosis (HCC) 08/03/2019  . Spondylosis of lumbar spine 11/06/2016  . Spinal stenosis of lumbar region with neurogenic claudication 10/28/2016  . Chronic bilateral low back pain with  bilateral sciatica 09/16/2016  . Hyperglycemia 08/07/2016  . Moderate episode of recurrent major depressive disorder (HCC) 08/07/2016  . Pain of left heel 06/02/2016  . Degenerative cervical disc 03/04/2016  . Gastroesophageal reflux disease without esophagitis 02/01/2016  . Primary osteoarthritis of right hand 02/01/2016  . Paresthesias in left hand 02/01/2016  . SOB (shortness of breath) 10/05/2013  . Asthma-COPD overlap syndrome (HCC) 10/05/2013  . History of smoking 10/05/2013  . Hyperlipidemia 10/05/2013  . Adult onset hypothyroidism 10/05/2013    Past Surgical History:  Procedure Laterality Date  . ABDOMINAL HYSTERECTOMY    . CARPAL TUNNEL RELEASE Right   . LUMBAR LAMINECTOMY  03/16/2018   L4-5 Laminectomy & Fusion w/ Pedicle Screws, TLIF & Allograft; Surgeon: Virl DiamondMax William Cohen, MD; Location: [facility] MAIN OR; Service: Orthopedics; Laterality: N/A; Prone, ProAxis, Stim Neuromonitoring, O-Arm, Cell Saver, [employer], [employer], Justin, PACS on [facility]    . OOPHORECTOMY    . ORIF TIBIA PLATEAU Left 03/10/2016   Procedure: OPEN REDUCTION INTERNAL FIXATION (ORIF) BICONDYLAR TIBIAL PLATEAU FRACTURE;  Surgeon: Eldred MangesMark C Yates, MD;  Location: [facility] OR;  Service: Orthopedics;  Laterality: Left;  . SHOULDER ARTHROSCOPY W/ ROTATOR CUFF REPAIR Right   . TONSILLECTOMY AND ADENOIDECTOMY      Family History  Problem Relation Age of Onset  . Hypertension Daughter   . Breast cancer Daughter   . Diabetes Father   . Heart disease Father   . Breast cancer Maternal Aunt 60  . Breast cancer Paternal Aunt 260  . Depression Sister   . Alcohol abuse Sister   . Anxiety disorder Sister   . Bipolar disorder Sister   . Pneumonia Sister   . Skin cancer Daughter   . Anxiety disorder Daughter     Social History   Tobacco Use  . Smoking status: Former Smoker    Packs/day: 2.00    Years: 30.00    Pack years: 60.00    Quit date: 09/03/2008    Years since quitting: 12.2  . Smokeless tobacco: Never Used   Substance Use Topics  . Alcohol use: No     Current Outpatient Medications:  .  albuterol (VENTOLIN HFA) 108 (90 Base) MCG/ACT inhaler, INHALE 2 PUFFS INTO THE LUNGS EVERY 6 HOURS AS NEEDED FOR WHEEZING OR SHORTNESS OF BREATH, Disp: 6.7 g, Rfl: 0 .  buPROPion (WELLBUTRIN XL) 150 MG 24 hr tablet, Take 1 tablet (150 mg total) by mouth daily., Disp: 90 tablet, Rfl: 1 .  Cholecalciferol (VITAMIN D PO), Take 1 capsule by mouth daily. 1000 IU, Disp: , Rfl:  .  DULoxetine (CYMBALTA) 60 MG capsule, Take 1 capsule (60 mg total) by mouth daily., Disp: 90 capsule, Rfl: 0 .  fluticasone (FLONASE) 50 MCG/ACT nasal spray, SHAKE LIQUID AND USE 2 SPRAYS IN EACH NOSTRIL DAILY, Disp: 48 g, Rfl: 0 .  Fluticasone-Umeclidin-Vilant (TRELEGY ELLIPTA) 100-62.5-25 MCG/INH AEPB, Inhale 1 puff into the lungs daily., Disp: 180 each, Rfl: 1 .  ipratropium-albuterol (DUONEB) 0.5-2.5 (3) MG/3ML SOLN, Take 3 mLs by nebulization 3 (three) times daily as needed. For asthma/copd exacerbation symptoms (instead of albuterol neb), Disp: 180 mL, Rfl: 1 .  levothyroxine (SYNTHROID) 75 MCG tablet, TAKE 1 TABLET(75 MCG) BY MOUTH DAILY, Disp: 90 tablet, Rfl: 3 .  loratadine (CLARITIN) 10 MG tablet, Take 1 tablet (10 mg total) by mouth at bedtime., Disp: 30 tablet, Rfl: 11 .  Melatonin 5 MG CAPS, Take 1 capsule by mouth daily. Pt taking 10 mg qhs, Disp: , Rfl:  .  Misc. Devices (PULSE OXIMETER) MISC, 1 each by Does not apply route 4 (four) times daily., Disp: 1 each, Rfl: 0 .  montelukast (SINGULAIR) 10 MG tablet, Take 1 tablet (10 mg total) by mouth at bedtime., Disp: 90 tablet, Rfl: 1 .  omeprazole (PRILOSEC) 40 MG capsule, TAKE ONE CAPSULE BY MOUTH DAILY, Disp: 90 capsule, Rfl: 1 .  OZEMPIC, 0.25 OR 0.5 MG/DOSE, 2 MG/1.5ML SOPN, INJECT 0.25 OR 0.5 MG INTO THE SKIN ONCE A WEEK AS DIRECTED, Disp: 1.5 mL, Rfl: 0 .  rosuvastatin (CRESTOR) 20 MG tablet, Take 1 tablet (20 mg total) by mouth daily., Disp: 90 tablet, Rfl: 1 .  SUMAtriptan  (IMITREX) 50 MG tablet, May repeat in 2 hours if headache persists or recurs. Do not take more than 2 in 24 hours., Disp: 10 tablet, Rfl: 0 .  traMADol (ULTRAM) 50 MG tablet, Take 50 mg by mouth every 6 (six) hours as needed., Disp: , Rfl:  .  traZODone (DESYREL) 50 MG tablet, Take 0.5-1 tablets (25-50 mg total) by mouth at bedtime as needed for sleep., Disp: 30 tablet, Rfl: 0 .  triamcinolone cream (KENALOG) 0.1 %, Apply 1 application topically 2 (two) times daily., Disp: 30 g, Rfl: 0  No Known Allergies  I personally reviewed active problem list, medication list, allergies, family history, social history, health maintenance, notes from last encounter with the patient/caregiver today.   ROS  Constitutional: Negative for fever, positive for  weight change - loss   Respiratory: positive for cough but no  shortness of breath.   Cardiovascular: Negative for chest pain or palpitations.  Gastrointestinal: Negative for abdominal pain, no bowel changes.  Musculoskeletal: Negative for gait problem or joint swelling.  Skin: Negative for rash.  Neurological: Negative for dizziness , positive for intermittent  headache.  No other specific complaints in a complete review of systems (except as listed in HPI above).  Objective   Vitals:   12/11/20 1306  BP: 122/76  Pulse: 89  Resp: 16  Temp: 98.2 F (36.8 C)  TempSrc: Oral  SpO2: 97%  Weight: 148 lb (67.1 kg)  Height: 4\' 9"  (1.448 m)    Body mass index is 32.03 kg/m.  Physical Exam  Constitutional: Patient appears well-developed and well-nourished. Obese  No distress.  HEENT: head atraumatic, normocephalic, pupils equal and reactive to light,neck supple Cardiovascular: Normal rate, regular rhythm and normal heart sounds.  No murmur heard. No BLE edema. Pulmonary/Chest: Effort normal and breath sounds normal. No respiratory distress. Abdominal: Soft.  There is no tenderness. Psychiatric: Patient has a normal mood and affect. behavior  is normal. Judgment and thought content normal.  Recent Results (from the past 2160 hour(s))  POCT HgB A1C     Status: Abnormal   Collection Time: 09/17/20  1:11 PM  Result Value Ref Range   Hemoglobin A1C 5.7 (A) 4.0 - 5.6 %   HbA1c POC (<> result, manual entry)     HbA1c,  POC (prediabetic range)     HbA1c, POC (controlled diabetic range)    Microalbumin / creatinine urine ratio     Status: Abnormal   Collection Time: 09/17/20  1:55 PM  Result Value Ref Range   Creatinine, Urine 19 (L) 20 - 275 mg/dL   Microalb, Ur <7.8 mg/dL    Comment: Reference Range Not established    Microalb Creat Ratio NOTE <30 mcg/mg creat    Comment: NOTE: The urine albumin value is less than  0.2 mg/dL therefore we are unable to calculate  excretion and/or creatinine ratio. . The ADA defines abnormalities in albumin excretion as follows: Marland Kitchen Albuminuria Category        Result (mcg/mg creatinine) . Normal to Mildly increased   <30 Moderately increased         30-299  Severely increased           > OR = 300 . The ADA recommends that at least two of three specimens collected within a 3-6 month period be abnormal before considering a patient to be within a diagnostic category.   Lipid panel     Status: None   Collection Time: 09/17/20  1:55 PM  Result Value Ref Range   Cholesterol 124 <200 mg/dL   HDL 66 > OR = 50 mg/dL   Triglycerides 55 <588 mg/dL   LDL Cholesterol (Calc) 44 mg/dL (calc)    Comment: Reference range: <100 . Desirable range <100 mg/dL for primary prevention;   <70 mg/dL for patients with CHD or diabetic patients  with > or = 2 CHD risk factors. Marland Kitchen LDL-C is now calculated using the Martin-Hopkins  calculation, which is a validated novel method providing  better accuracy than the Friedewald equation in the  estimation of LDL-C.  Horald Pollen et al. Lenox Ahr. 5027;741(28): 2061-2068  (http://education.[facility].com/faq/FAQ164)    Total CHOL/HDL Ratio 1.9 <5.0 (calc)    Non-HDL Cholesterol (Calc) 58 <786 mg/dL (calc)    Comment: For patients with diabetes plus 1 major ASCVD risk  factor, treating to a non-HDL-C goal of <100 mg/dL  (LDL-C of <76 mg/dL) is considered a therapeutic  option.   COMPLETE METABOLIC PANEL WITH GFR     Status: Abnormal   Collection Time: 09/17/20  1:55 PM  Result Value Ref Range   Glucose, Bld 92 65 - 99 mg/dL    Comment: .            Fasting reference interval .    BUN 11 7 - 25 mg/dL   Creat 7.20 9.47 - 0.96 mg/dL    Comment: For patients >37 years of age, the reference limit for Creatinine is approximately 13% higher for people identified as African-American. .    GFR, Est Non African American 79 > OR = 60 mL/min/1.75m2   GFR, Est African American 92 > OR = 60 mL/min/1.73m2   BUN/Creatinine Ratio NOT APPLICABLE 6 - 22 (calc)   Sodium 141 135 - 146 mmol/L   Potassium 3.9 3.5 - 5.3 mmol/L   Chloride 106 98 - 110 mmol/L   CO2 31 20 - 32 mmol/L   Calcium 9.3 8.6 - 10.4 mg/dL   Total Protein 5.8 (L) 6.1 - 8.1 g/dL   Albumin 3.9 3.6 - 5.1 g/dL   Globulin 1.9 1.9 - 3.7 g/dL (calc)   AG Ratio 2.1 1.0 - 2.5 (calc)   Total Bilirubin 0.6 0.2 - 1.2 mg/dL   Alkaline phosphatase (APISO) 65 37 - 153 U/L   AST 19 10 -  35 U/L   ALT 19 6 - 29 U/L  CBC with Differential/Platelet     Status: None   Collection Time: 09/17/20  1:55 PM  Result Value Ref Range   WBC 5.2 3.8 - 10.8 Thousand/uL   RBC 4.28 3.80 - 5.10 Million/uL   Hemoglobin 12.8 11.7 - 15.5 g/dL   HCT 67.1 24.5 - 80.9 %   MCV 90.2 80.0 - 100.0 fL   MCH 29.9 27.0 - 33.0 pg   MCHC 33.2 32.0 - 36.0 g/dL   RDW 98.3 38.2 - 50.5 %   Platelets 245 140 - 400 Thousand/uL   MPV 9.5 7.5 - 12.5 fL   Neutro Abs 2,725 1,500 - 7,800 cells/uL   Lymphs Abs 1,773 850 - 3,900 cells/uL   Absolute Monocytes 369 200 - 950 cells/uL   Eosinophils Absolute 291 15 - 500 cells/uL   Basophils Absolute 42 0 - 200 cells/uL   Neutrophils Relative % 52.4 %   Total Lymphocyte 34.1 %    Monocytes Relative 7.1 %   Eosinophils Relative 5.6 %   Basophils Relative 0.8 %  VITAMIN D 25 Hydroxy (Vit-D Deficiency, Fractures)     Status: None   Collection Time: 09/17/20  1:55 PM  Result Value Ref Range   Vit D, 25-Hydroxy 42 30 - 100 ng/mL    Comment: Vitamin D Status         25-OH Vitamin D: . Deficiency:                    <20 ng/mL Insufficiency:             20 - 29 ng/mL Optimal:                 > or = 30 ng/mL . For 25-OH Vitamin D testing on patients on  D2-supplementation and patients for whom quantitation  of D2 and D3 fractions is required, the [facility](TM) 25-OH VIT D, (D2,D3), LC/MS/MS is recommended: order  code 39767 (patients >21yrs). See Note 1 . Note 1 . For additional information, please refer to  http://education.[facility].com/faq/FAQ199  (This link is being provided for informational/ educational purposes only.)   TSH     Status: None   Collection Time: 09/17/20  1:55 PM  Result Value Ref Range   TSH 0.93 0.40 - 4.50 mIU/L  POCT HgB A1C     Status: Abnormal   Collection Time: 12/11/20  1:24 PM  Result Value Ref Range   Hemoglobin A1C 5.7 (A) 4.0 - 5.6 %   HbA1c POC (<> result, manual entry)     HbA1c, POC (prediabetic range)     HbA1c, POC (controlled diabetic range)       PHQ2/9: Depression screen [facility] 2/9 12/11/2020 10/31/2020 09/17/2020 07/12/2020 06/05/2020  Decreased Interest 0 0 2 0 0  Down, Depressed, Hopeless 0 0 0 0 0  PHQ - 2 Score 0 0 2 0 0  Altered sleeping - 0 2 - 1  Tired, decreased energy - 0 2 - 0  Change in appetite - 0 0 - 0  Feeling bad or failure about yourself  - 0 0 - 0  Trouble concentrating - 0 0 - 0  Moving slowly or fidgety/restless - 0 0 - 0  Suicidal thoughts - 0 0 - 0  PHQ-9 Score - 0 6 - 1  Difficult doing work/chores - Not difficult at all - - -  Some recent data might be hidden    phq 9  is negative   Fall Risk: Fall Risk  12/11/2020 10/31/2020 09/17/2020 07/12/2020 06/05/2020  Falls in the past  year? 0 0 1 1 1   Number falls in past yr: 0 0 0 0 0  Injury with Fall? 0 0 0 1 1  Comment - - - - -  Risk for fall due to : - - - History of fall(s) History of fall(s)  Follow up - - - Falls prevention discussed -     Functional Status Survey: Is the patient deaf or have difficulty hearing?: No Does the patient have difficulty seeing, even when wearing glasses/contacts?: No Does the patient have difficulty concentrating, remembering, or making decisions?: No Does the patient have difficulty walking or climbing stairs?: No Does the patient have difficulty dressing or bathing?: No Does the patient have difficulty doing errands alone such as visiting a doctor's office or shopping?: No    Assessment & Plan  1. Dyslipidemia associated with type 2 diabetes mellitus (HCC)  - POCT HgB A1C  2. Migraine without aura and without status migrainosus, not intractable   3. Gastroesophageal reflux disease without esophagitis   4. Dyslipidemia   5. Thoracic aorta atherosclerosis (HCC)  On statin therapy, last LDL at goal   6. Insomnia due to mental disorder  She states taking Trazodone 100 mg we will contact pharmacy   7. Asthma-COPD overlap syndrome (HCC)   8. Senile purpura (HCC)   9. Centrilobular emphysema (HCC)   10. Adult onset hypothyroidism  TSH at goal   27. Age-related osteoporosis without current pathological fracture   12. Major depression in remission (HCC)   13. Dyslipidemia associated with type 2 diabetes mellitus (HCC)  - POCT HgB A1C  14. Rash  Intermittent due to bug bites and wants refill of triamcinolone   15. Nocturnal leg cramps  - Ferritin - Magnesium

## 2020-12-11 ENCOUNTER — Other Ambulatory Visit: Payer: Self-pay

## 2020-12-11 ENCOUNTER — Encounter: Payer: Self-pay | Admitting: Family Medicine

## 2020-12-11 ENCOUNTER — Ambulatory Visit (INDEPENDENT_AMBULATORY_CARE_PROVIDER_SITE_OTHER): Payer: Medicare Other | Admitting: Family Medicine

## 2020-12-11 VITALS — BP 122/76 | HR 89 | Temp 98.2°F | Resp 16 | Ht <= 58 in | Wt 148.0 lb

## 2020-12-11 DIAGNOSIS — M810 Age-related osteoporosis without current pathological fracture: Secondary | ICD-10-CM | POA: Diagnosis not present

## 2020-12-11 DIAGNOSIS — I700 Atherosclerosis of aorta: Secondary | ICD-10-CM

## 2020-12-11 DIAGNOSIS — E785 Hyperlipidemia, unspecified: Secondary | ICD-10-CM

## 2020-12-11 DIAGNOSIS — K219 Gastro-esophageal reflux disease without esophagitis: Secondary | ICD-10-CM

## 2020-12-11 DIAGNOSIS — E038 Other specified hypothyroidism: Secondary | ICD-10-CM | POA: Diagnosis not present

## 2020-12-11 DIAGNOSIS — F325 Major depressive disorder, single episode, in full remission: Secondary | ICD-10-CM

## 2020-12-11 DIAGNOSIS — G4762 Sleep related leg cramps: Secondary | ICD-10-CM

## 2020-12-11 DIAGNOSIS — G43009 Migraine without aura, not intractable, without status migrainosus: Secondary | ICD-10-CM

## 2020-12-11 DIAGNOSIS — J449 Chronic obstructive pulmonary disease, unspecified: Secondary | ICD-10-CM

## 2020-12-11 DIAGNOSIS — R21 Rash and other nonspecific skin eruption: Secondary | ICD-10-CM

## 2020-12-11 DIAGNOSIS — D692 Other nonthrombocytopenic purpura: Secondary | ICD-10-CM

## 2020-12-11 DIAGNOSIS — F5105 Insomnia due to other mental disorder: Secondary | ICD-10-CM

## 2020-12-11 DIAGNOSIS — E1169 Type 2 diabetes mellitus with other specified complication: Secondary | ICD-10-CM | POA: Diagnosis not present

## 2020-12-11 DIAGNOSIS — J432 Centrilobular emphysema: Secondary | ICD-10-CM | POA: Diagnosis not present

## 2020-12-11 DIAGNOSIS — M1990 Unspecified osteoarthritis, unspecified site: Secondary | ICD-10-CM | POA: Diagnosis not present

## 2020-12-11 DIAGNOSIS — B009 Herpesviral infection, unspecified: Secondary | ICD-10-CM

## 2020-12-11 LAB — POCT GLYCOSYLATED HEMOGLOBIN (HGB A1C): Hemoglobin A1C: 5.7 % — AB (ref 4.0–5.6)

## 2020-12-11 MED ORDER — LORATADINE 10 MG PO TABS: 10.0000 mg | ORAL_TABLET | Freq: Every day | ORAL | 1 refills | Status: DC

## 2020-12-11 MED ORDER — DULOXETINE HCL 60 MG PO CPEP: 60.0000 mg | ORAL_CAPSULE | Freq: Every day | ORAL | 1 refills | Status: DC

## 2020-12-11 MED ORDER — OZEMPIC (0.25 OR 0.5 MG/DOSE) 2 MG/1.5ML ~~LOC~~ SOPN: 0.5000 mg | PEN_INJECTOR | SUBCUTANEOUS | 1 refills | Status: DC

## 2020-12-11 MED ORDER — OZEMPIC (0.25 OR 0.5 MG/DOSE) 2 MG/1.5ML ~~LOC~~ SOPN: 0.5000 mg | PEN_INJECTOR | Freq: Every day | SUBCUTANEOUS | 1 refills | Status: DC

## 2020-12-11 MED ORDER — VALACYCLOVIR HCL 500 MG PO TABS: 500.0000 mg | ORAL_TABLET | Freq: Every day | ORAL | 1 refills | Status: AC

## 2020-12-11 MED ORDER — TRIAMCINOLONE ACETONIDE 0.1 % EX CREA: 1.0000 "application " | TOPICAL_CREAM | Freq: Two times a day (BID) | CUTANEOUS | 0 refills | Status: DC

## 2020-12-11 MED ORDER — TRAZODONE HCL 100 MG PO TABS: 100.0000 mg | ORAL_TABLET | Freq: Every evening | ORAL | 1 refills | Status: DC | PRN

## 2020-12-12 LAB — FERRITIN: Ferritin: 83 ng/mL (ref 16–288)

## 2020-12-12 LAB — MAGNESIUM: Magnesium: 2.3 mg/dL (ref 1.5–2.5)

## 2021-01-14 DIAGNOSIS — R21 Rash and other nonspecific skin eruption: Secondary | ICD-10-CM | POA: Diagnosis not present

## 2021-01-14 DIAGNOSIS — M7522 Bicipital tendinitis, left shoulder: Secondary | ICD-10-CM | POA: Diagnosis not present

## 2021-01-14 DIAGNOSIS — M7582 Other shoulder lesions, left shoulder: Secondary | ICD-10-CM | POA: Diagnosis not present

## 2021-01-14 DIAGNOSIS — M19012 Primary osteoarthritis, left shoulder: Secondary | ICD-10-CM | POA: Diagnosis not present

## 2021-02-14 ENCOUNTER — Ambulatory Visit: Payer: Self-pay

## 2021-02-14 ENCOUNTER — Ambulatory Visit (INDEPENDENT_AMBULATORY_CARE_PROVIDER_SITE_OTHER): Payer: Medicare Other | Admitting: Family Medicine

## 2021-02-14 ENCOUNTER — Encounter: Payer: Self-pay | Admitting: Family Medicine

## 2021-02-14 DIAGNOSIS — U071 COVID-19: Secondary | ICD-10-CM

## 2021-02-14 MED ORDER — NIRMATRELVIR/RITONAVIR (PAXLOVID)TABLET: 3.0000 | ORAL_TABLET | Freq: Two times a day (BID) | ORAL | 0 refills | Status: AC

## 2021-02-14 MED ORDER — BENZONATATE 100 MG PO CAPS: 100.0000 mg | ORAL_CAPSULE | Freq: Two times a day (BID) | ORAL | 0 refills | Status: DC | PRN

## 2021-02-14 NOTE — Telephone Encounter (Signed)
Reason for Disposition  MILD difficulty breathing (e.g., minimal/no SOB at rest, SOB with walking, pulse <100)  Answer Assessment - Initial Assessment Questions 1. COVID-19 DIAGNOSIS: "Who made your COVID-19 diagnosis?" "Was it confirmed by a positive lab test or self-test?" If not diagnosed by a doctor (or NP/PA), ask "Are there lots of cases (community spread) where you live?" Note: See public health department website, if unsure.     Home test 2. COVID-19 EXPOSURE: "Was there any known exposure to COVID before the symptoms began?" CDC Definition of close contact: within 6 feet (2 meters) for a total of 15 minutes or more over a 24-hour period.      Yes 3. ONSET: "When did the COVID-19 symptoms start?"      Tuesday 4. WORST SYMPTOM: "What is your worst symptom?" (e.g., cough, fever, shortness of breath, muscle aches)     Nausea 5. COUGH: "Do you have a cough?" If Yes, ask: "How bad is the cough?"       Yes 6. FEVER: "Do you have a fever?" If Yes, ask: "What is your temperature, how was it measured, and when did it start?"     No 7. RESPIRATORY STATUS: "Describe your breathing?" (e.g., shortness of breath, wheezing, unable to speak)      Shortness of breath 8. BETTER-SAME-WORSE: "Are you getting better, staying the same or getting worse compared to yesterday?"  If getting worse, ask, "In what way?"     Worse 9. HIGH RISK DISEASE: "Do you have any chronic medical problems?" (e.g., asthma, heart or lung disease, weak immune system, obesity, etc.)     Diabetes,asthma,COPD 10. VACCINE: "Have you had the COVID-19 vaccine?" If Yes, ask: "Which one, how many shots, when did you get it?"       Yes 11. BOOSTER: "Have you received your COVID-19 booster?" If Yes, ask: "Which one and when did you get it?"       No 12. PREGNANCY: "Is there any chance you are pregnant?" "When was your last menstrual period?"       No 13. OTHER SYMPTOMS: "Do you have any other symptoms?"  (e.g., chills, fatigue,  headache, loss of smell or taste, muscle pain, sore throat)       Headache, sinus pain, chills, fatigue 14. O2 SATURATION MONITOR:  "Do you use an oxygen saturation monitor (pulse oximeter) at home?" If Yes, ask "What is your reading (oxygen level) today?" "What is your usual oxygen saturation reading?" (e.g., 95%)       No  Protocols used: Coronavirus (COVID-19) Diagnosed or Suspected-A-AH

## 2021-02-14 NOTE — Telephone Encounter (Signed)
Pt. States she tested positive for COVID 19 yesterday with home test. Did have exposure from family member. Symptoms started Tuesday. Chills, headache, dry cough, sinus symptoms,fatigue,body aches, mild shortness of breath. Virtual visit made for today.

## 2021-02-14 NOTE — Progress Notes (Signed)
Name: Sara Andrews   MRN: [id number]    DOB: [birth year]   Date:02/14/2021       Progress Note   Outpatient Oral COVID Treatment Note  I connected with Sara Andrews on 02/14/2021/9:42 AM by telephone and verified that I am speaking with the correct person using two identifiers.  I discussed the limitations, risks, security, and privacy concerns of performing an evaluation and management service by telephone and the availability of in person appointments. I also discussed with the patient that there may be a patient responsible charge related to this service. The patient expressed understanding and agreed to proceed.  Patient Location: home  Provider Location: [facility]  Diagnosis: COVID-19 infection  Purpose of visit: Discussion of potential use of Molnupiravir or Paxlovid, a new treatment for mild to moderate COVID-19 viral infection in non-hospitalized patients.   Subjective: Patient is a 65 y.o. female who has been diagnosed with COVID 19 viral infection.  Their symptoms began on 02/12/2021  with nasal congestion, body aches, headaches, nausea, she also has cough and some sob. Advised to monitor pulse oximeter at home and go to [facility] if it drops below 90 % continue COPD medication  Past Medical History:  Diagnosis Date   Anxiety    Arthritis    "hips, hands, back" (03/10/2016)   Asthma    Chronic bronchitis (HCC)    COPD (chronic obstructive pulmonary disease) (HCC)    Cough    Depression    Esophagitis, reflux    GERD (gastroesophageal reflux disease)    Hyperlipidemia    Hypothyroidism    IBS (irritable bowel syndrome)    Lumbago    Muscle pain    Osteoarthritis    Sinus disorder    Thyroid disease    Type 2 diabetes, diet controlled (HCC)    Uncomplicated herpes simplex     No Known Allergies   Current Outpatient Medications:    albuterol (VENTOLIN HFA) 108 (90 Base) MCG/ACT inhaler, INHALE 2 PUFFS INTO THE LUNGS EVERY 6 HOURS AS NEEDED FOR WHEEZING OR  SHORTNESS OF BREATH, Disp: 6.7 g, Rfl: 0   buPROPion (WELLBUTRIN XL) 150 MG 24 hr tablet, Take 1 tablet (150 mg total) by mouth daily., Disp: 90 tablet, Rfl: 1   Cholecalciferol (VITAMIN D PO), Take 1 capsule by mouth daily. 1000 IU, Disp: , Rfl:    DULoxetine (CYMBALTA) 60 MG capsule, Take 1 capsule (60 mg total) by mouth daily., Disp: 90 capsule, Rfl: 1   fluticasone (FLONASE) 50 MCG/ACT nasal spray, SHAKE LIQUID AND USE 2 SPRAYS IN EACH NOSTRIL DAILY, Disp: 48 g, Rfl: 0   Fluticasone-Umeclidin-Vilant (TRELEGY ELLIPTA) 100-62.5-25 MCG/INH AEPB, Inhale 1 puff into the lungs daily., Disp: 180 each, Rfl: 1   ipratropium-albuterol (DUONEB) 0.5-2.5 (3) MG/3ML SOLN, Take 3 mLs by nebulization 3 (three) times daily as needed. For asthma/copd exacerbation symptoms (instead of albuterol neb), Disp: 180 mL, Rfl: 1   levothyroxine (SYNTHROID) 75 MCG tablet, TAKE 1 TABLET(75 MCG) BY MOUTH DAILY, Disp: 90 tablet, Rfl: 3   loratadine (CLARITIN) 10 MG tablet, Take 1 tablet (10 mg total) by mouth at bedtime., Disp: 90 tablet, Rfl: 1   Melatonin 5 MG CAPS, Take 1 capsule by mouth daily. Pt taking 10 mg qhs, Disp: , Rfl:    Misc. Devices (PULSE OXIMETER) MISC, 1 each by Does not apply route 4 (four) times daily., Disp: 1 each, Rfl: 0   montelukast (SINGULAIR) 10 MG tablet, Take 1 tablet (10 mg total) by  mouth at bedtime., Disp: 90 tablet, Rfl: 1   omeprazole (PRILOSEC) 40 MG capsule, TAKE ONE CAPSULE BY MOUTH DAILY, Disp: 90 capsule, Rfl: 1   rosuvastatin (CRESTOR) 20 MG tablet, Take 1 tablet (20 mg total) by mouth daily., Disp: 90 tablet, Rfl: 1   Semaglutide,0.25 or 0.5MG /DOS, (OZEMPIC, 0.25 OR 0.5 MG/DOSE,) 2 MG/1.5ML SOPN, Take 0.5 mg by mouth once a week., Disp: 4 each, Rfl: 1   SUMAtriptan (IMITREX) 50 MG tablet, May repeat in 2 hours if headache persists or recurs. Do not take more than 2 in 24 hours., Disp: 10 tablet, Rfl: 0   traMADol (ULTRAM) 50 MG tablet, Take 50 mg by mouth every 6 (six) hours as  needed., Disp: , Rfl:    traZODone (DESYREL) 100 MG tablet, Take 1 tablet (100 mg total) by mouth at bedtime as needed for sleep., Disp: 90 tablet, Rfl: 1   triamcinolone cream (KENALOG) 0.1 %, Apply 1 application topically 2 (two) times daily., Disp: 30 g, Rfl: 0  Objective: Patient appears/sounds congested .  They are in no apparent distress.  Breathing is non labored.  Mood and behavior are normal.  Laboratory Data:  Recent Results (from the past 2160 hour(s))  POCT HgB A1C     Status: Abnormal   Collection Time: 12/11/20  1:24 PM  Result Value Ref Range   Hemoglobin A1C 5.7 (A) 4.0 - 5.6 %   HbA1c POC (<> result, manual entry)     HbA1c, POC (prediabetic range)     HbA1c, POC (controlled diabetic range)    Ferritin     Status: None   Collection Time: 12/11/20  2:04 PM  Result Value Ref Range   Ferritin 83 16 - 288 ng/mL  Magnesium     Status: None   Collection Time: 12/11/20  2:04 PM  Result Value Ref Range   Magnesium 2.3 1.5 - 2.5 mg/dL     Assessment: 66 y.o. female with mild/moderate COVID 19 viral infection diagnosed on 07/20/202  at high risk for progression to severe COVID 19. S Initial symptoms started on 02/12/2021 nasal congestion, rhinorrhea, headache, nausea and one episode of vomiting, mild sore throat, severe body aches. She has been drinking a lot of fluids but not eating much   Plan:  This patient is a 65 y.o. female that meets the following criteria for Emergency Use Authorization of: Paxlovid 1. Age >12 yr AND > 40 kg 2. SARS-COV-2 positive test 3. Symptom onset < 5 days 4. Mild-to-moderate COVID disease with high risk for severe progression to hospitalization or death  I have spoken and communicated the following to the patient or parent/caregiver regarding: Paxlovid is an unapproved drug that is authorized for use under an Emergency Use Authorization.  There are no adequate, approved, available products for the treatment of COVID-19 in adults who have  mild-to-moderate COVID-19 and are at high risk for progressing to severe COVID-19, including hospitalization or death. Other therapeutics are currently authorized. For additional information on all products authorized for treatment or prevention of COVID-19, please see [email or web address]-[email or web address]  There are benefits and risks of taking this treatment as outlined in the "Fact Sheet for Patients and Caregivers."  "Fact Sheet for Patients and Caregivers" was reviewed with patient. A hard copy will be provided to patient from pharmacy prior to the patient receiving treatment. Patients should continue to self-isolate and use infection control measures (e.g., wear mask, isolate, social distance, avoid sharing personal items, clean and disinfect "high touch" surfaces, and frequent  handwashing) according to CDC guidelines.  The patient or parent/caregiver has the option to accept or refuse treatment. Patient medication history was reviewed for potential drug interactions:Wellbutrin  Patient's GFR was calculated to be 79, and they were therefore prescribed Normal dose (GFR>60) - nirmatrelvir 150mg  tab (2 tablet) by mouth twice daily AND ritonavir 100mg  tab (1 tablet) by mouth twice daily  After reviewing above information with the patient, the patient agrees to receive Paxlovid.  Follow up instructions:    Take prescription BID x 5 days as directed Reach out to pharmacist for counseling on medication if desired For concerns regarding further COVID symptoms please follow up with your PCP or urgent care For urgent or life-threatening issues, seek care at your local emergency department  The patient was provided an opportunity to ask questions, and all were answered. The patient agreed with the plan and demonstrated an understanding of the instructions.   Script sent to  and opted to pick up RX.  The  patient was advised to call their PCP or seek an in-person evaluation if the symptoms worsen or if the condition fails to improve as anticipated.   I provided 15  minutes of non face-to-face telephone visit time during this encounter, and > 50% was spent counseling as documented under my assessment & plan.  , MD 02/14/2021 Ruel Favors AM

## 2021-03-09 ENCOUNTER — Other Ambulatory Visit: Payer: Self-pay | Admitting: Family Medicine

## 2021-03-09 DIAGNOSIS — J449 Chronic obstructive pulmonary disease, unspecified: Secondary | ICD-10-CM

## 2021-03-09 NOTE — Telephone Encounter (Signed)
Requested medication (s) are due for refill today: yes  Requested medication (s) are on the active medication list: yes  Last refill:  09/17/20  Future visit scheduled: yes  Notes to clinic:  med not assigned to a protocol   Requested Prescriptions  Pending Prescriptions Disp Refills   TRELEGY ELLIPTA 100-62.5-25 MCG/INH AEPB [Pharmacy Med Name: TRELEGY ELLIPTA 100-62. INH 30P] 180 each     Sig: INHALE 1 PUFF INTO THE LUNGS DAILY     Off-Protocol Failed - 03/09/2021  7:10 AM      Failed - Medication not assigned to a protocol, review manually.      Passed - Valid encounter within last 12 months    Recent Outpatient Visits           3 weeks ago COVID-19   [facility] Cyrus, Danna Hefty, MD   2 months ago Dyslipidemia associated with type 2 diabetes mellitus [facility])   [facility] Alba Cory, MD   4 months ago Asthma-COPD overlap syndrome [facility])   [facility] Danelle Berry, PA-C   5 months ago Dyslipidemia associated with type 2 diabetes mellitus [facility])   [facility] Alba Cory, MD   9 months ago COPD with acute exacerbation [facility])   [facility] Alba Cory, MD       Future Appointments             In 4 days Alba Cory, MD [facility]   In 4 months  [facility]            Signed Prescriptions Disp Refills   montelukast (SINGULAIR) 10 MG tablet 90 tablet 1    Sig: TAKE 1 TABLET(10 MG) BY MOUTH AT BEDTIME     Pulmonology:  Leukotriene Inhibitors Passed - 03/09/2021  7:10 AM      Passed - Valid encounter within last 12 months    Recent Outpatient Visits           3 weeks ago COVID-19   [facility] Alba Cory, MD   2 months ago Dyslipidemia associated with type 2 diabetes mellitus [facility])   [facility] Alba Cory, MD   4 months ago Asthma-COPD overlap  syndrome [facility])   [facility] Danelle Berry, PA-C   5 months ago Dyslipidemia associated with type 2 diabetes mellitus [facility])   [facility] Alba Cory, MD   9 months ago COPD with acute exacerbation [facility])   [facility] Alba Cory, MD       Future Appointments             In 4 days Alba Cory, MD [facility]   In 4 months  [facility]

## 2021-03-09 NOTE — Telephone Encounter (Signed)
Requested Prescriptions  Pending Prescriptions Disp Refills  . montelukast (SINGULAIR) 10 MG tablet [Pharmacy Med Name: MONTELUKAST 10MG  TABLETS] 90 tablet 1    Sig: TAKE 1 TABLET(10 MG) BY MOUTH AT BEDTIME     Pulmonology:  Leukotriene Inhibitors Passed - 03/09/2021  7:10 AM      Passed - Valid encounter within last 12 months    Recent Outpatient Visits          3 weeks ago COVID-19   [facility], MD   2 months ago Dyslipidemia associated with type 2 diabetes mellitus [facility])   [facility], MD   4 months ago Asthma-COPD overlap syndrome [facility])   [facility], PA-C   5 months ago Dyslipidemia associated with type 2 diabetes mellitus [facility])   [facility], MD   9 months ago COPD with acute exacerbation [facility])   [facility], MD      Future Appointments            In 4 days Alba Cory, MD [facility]   In 4 months  [facility]           . TRELEGY ELLIPTA 100-62.5-25 MCG/INH AEPB [Pharmacy Med Name: TRELEGY ELLIPTA 100-62.08-26-1985 INH 30P] 180 each     Sig: INHALE 1 PUFF INTO THE LUNGS DAILY     Off-Protocol Failed - 03/09/2021  7:10 AM      Failed - Medication not assigned to a protocol, review manually.      Passed - Valid encounter within last 12 months    Recent Outpatient Visits          3 weeks ago COVID-19   [facility], Leugnies, MD   2 months ago Dyslipidemia associated with type 2 diabetes mellitus [facility])   [facility], MD   4 months ago Asthma-COPD overlap syndrome [facility])   [facility], PA-C   5 months ago Dyslipidemia associated with type 2 diabetes mellitus [facility])   [facility], MD   9 months ago COPD with acute  exacerbation [facility])   [facility], MD      Future Appointments            In 4 days Alba Cory, MD [facility]   In 4 months  [facility]

## 2021-03-12 NOTE — Progress Notes (Signed)
Name: Sara Andrews   MRN: [id number]    DOB: [birth year]   Date:03/13/2021       Progress Note  Subjective  Chief Complaint  Follow Up  HPI  Recurrent headaches: doing well on prn imitrex and zofran, she states not as frequent, she described pain as throbbing, photophobia, eye pain during episodes. Episodes once a month and stable.   Right elbow pain: started three days ago, sharp pain, worse with pressure and pronating / supinating arms, no swelling, no trauma   DDD and spondylolisthesis: doing well since surgery done 02/2018. She is off pain  medication, taking prn tylenol only and since pain is under control, she used to see Dr. Noel Gerold. Stable.   Leg cramps: only at night, she has to stand up and walk around and it improves, she is taking magnesium. Discussed RLS and we will try Requip     COPD/asthma : she is back on Trelegy, she rinses her mouth after she uses it. She has intermittent dry cough now, no longer daily.  No wheezing or SOB since started on Trelegy. She states uses rescue at most once a month now   Recurrent scalp pruritis: resolves since started taking Valtrex every night    GERD/dyspesia: she states she is doing well when off Ozempic, but since we resumed medication in Feb 22  when she went up to 05 mg dose she started to have nausea and bloating again, we advised to go down to 0.25 mg and she states nausea and bloating is under control at this time   Left Shoulder pain:  under the care of Ortho at [facility], she had two steroid injections and may  need to have rotator cuff repair. Pain is not as intense and wants to hold off on surgery at this time . Currently having some right elbow pain.    Hypothyroidism: She is currently taking 75 mcg daily and half on Mondays, last TSH was at goal . She lost weight since last visit by choice, no hair loss or dry skin , we will recheck labs yearly    DM II : diagnosed at work back in 2012 she used to take medications   She denies polyphagia, polyuria , she has polydipsia.  Started on Trulicity June 2018, but she was having worsening of constipation and indigestion, so we switched to Ozempic 04/2017 weight was 198 lbs and is down to 132.4  lbs. She was of Ozempic since January 21 and weight went up to 160 lbs, we stopped medication due to bloating and nausea, however she asked to go back on medication and has been able to tolerated lower dose of Ozempic at 0.25 mg and is helping her lose weight.    Major Depression:  no longer seeing Dr. Elna Breslow but taking medication as prescribed , she states sleep has improved, states taking care of her grandson that has ADD and school problems, but doing better. Happy with her own weight loss   Osteoporosis: discussed osteonecrosis of jaw, she is still seeing dentist, discussed referral to endo to try other medications She is wiling to see Endo    Chest pain: intermittently and states not as often now,  she is under the care of  cardiologist. Dr. Azucena Cecil and had EKG and echo that showed normal EF . CT lung had already shown some coronary calcification. She is compliant with statin therapy   Senile purpura: reassurance given. Both arms and legs    Atherosclerosis Aorta: found on  CT done 03/20/2020, on statin therapy , no side effects of medication   Patient Active Problem List   Diagnosis Date Noted   History of shingles 03/23/2020   Burning sensation of lower extremity 03/22/2020   Centrilobular emphysema (HCC) 01/12/2020   Senile purpura (HCC) 01/12/2020   Major depression in remission (HCC) 01/12/2020   Dyslipidemia associated with type 2 diabetes mellitus (HCC) 01/12/2020   Bursitis of hip 08/23/2019   Carpal tunnel syndrome 08/23/2019   Thoracic aorta atherosclerosis (HCC) 08/03/2019   Spondylosis of lumbar spine 11/06/2016   Spinal stenosis of lumbar region with neurogenic claudication 10/28/2016   Chronic bilateral low back pain with bilateral sciatica  09/16/2016   Hyperglycemia 08/07/2016   Moderate episode of recurrent major depressive disorder (HCC) 08/07/2016   Pain of left heel 06/02/2016   Degenerative cervical disc 03/04/2016   Gastroesophageal reflux disease without esophagitis 02/01/2016   Primary osteoarthritis of right hand 02/01/2016   Paresthesias in left hand 02/01/2016   SOB (shortness of breath) 10/05/2013   Asthma-COPD overlap syndrome (HCC) 10/05/2013   History of smoking 10/05/2013   Hyperlipidemia 10/05/2013   Adult onset hypothyroidism 10/05/2013    Past Surgical History:  Procedure Laterality Date   ABDOMINAL HYSTERECTOMY     CARPAL TUNNEL RELEASE Right    LUMBAR LAMINECTOMY  03/16/2018   L4-5 Laminectomy & Fusion w/ Pedicle Screws, TLIF & Allograft; Surgeon: Virl Diamond, MD; Location: [facility] MAIN OR; Service: Orthopedics; Laterality: N/A; Prone, ProAxis, Stim Neuromonitoring, O-Arm, Cell Saver, [employer], [employer], Justin, PACS on [facility]     OOPHORECTOMY     ORIF TIBIA PLATEAU Left 03/10/2016   Procedure: OPEN REDUCTION INTERNAL FIXATION (ORIF) BICONDYLAR TIBIAL PLATEAU FRACTURE;  Surgeon: Eldred Manges, MD;  Location: [facility] OR;  Service: Orthopedics;  Laterality: Left;   SHOULDER ARTHROSCOPY W/ ROTATOR CUFF REPAIR Right    TONSILLECTOMY AND ADENOIDECTOMY      Family History  Problem Relation Age of Onset   Hypertension Daughter    Breast cancer Daughter    Diabetes Father    Heart disease Father    Breast cancer Maternal Aunt 61   Breast cancer Paternal Aunt 89   Depression Sister    Alcohol abuse Sister    Anxiety disorder Sister    Bipolar disorder Sister    Pneumonia Sister    Skin cancer Daughter    Anxiety disorder Daughter     Social History   Tobacco Use   Smoking status: Former    Packs/day: 2.00    Years: 30.00    Pack years: 60.00    Types: Cigarettes    Quit date: 09/03/2008    Years since quitting: 12.5   Smokeless tobacco: Never  Substance Use Topics   Alcohol use: No      Current Outpatient Medications:    Cholecalciferol (VITAMIN D PO), Take 1 capsule by mouth daily. 1000 IU, Disp: , Rfl:    DULoxetine (CYMBALTA) 60 MG capsule, Take 1 capsule (60 mg total) by mouth daily., Disp: 90 capsule, Rfl: 1   fluticasone (FLONASE) 50 MCG/ACT nasal spray, SHAKE LIQUID AND USE 2 SPRAYS IN EACH NOSTRIL DAILY, Disp: 48 g, Rfl: 0   ipratropium-albuterol (DUONEB) 0.5-2.5 (3) MG/3ML SOLN, Take 3 mLs by nebulization 3 (three) times daily as needed. For asthma/copd exacerbation symptoms (instead of albuterol neb), Disp: 180 mL, Rfl: 1   levothyroxine (SYNTHROID) 75 MCG tablet, TAKE 1 TABLET(75 MCG) BY MOUTH DAILY, Disp: 90 tablet, Rfl: 3   loratadine (CLARITIN)  10 MG tablet, Take 1 tablet (10 mg total) by mouth at bedtime., Disp: 90 tablet, Rfl: 1   Melatonin 5 MG CAPS, Take 1 capsule by mouth daily. Pt taking 10 mg qhs, Disp: , Rfl:    Misc. Devices (PULSE OXIMETER) MISC, 1 each by Does not apply route 4 (four) times daily., Disp: 1 each, Rfl: 0   montelukast (SINGULAIR) 10 MG tablet, TAKE 1 TABLET(10 MG) BY MOUTH AT BEDTIME, Disp: 90 tablet, Rfl: 1   rOPINIRole (REQUIP) 0.5 MG tablet, Take 1-2 tablets (0.5-1 mg total) by mouth at bedtime., Disp: 60 tablet, Rfl: 0   SUMAtriptan (IMITREX) 50 MG tablet, May repeat in 2 hours if headache persists or recurs. Do not take more than 2 in 24 hours., Disp: 10 tablet, Rfl: 0   traMADol (ULTRAM) 50 MG tablet, Take 50 mg by mouth every 6 (six) hours as needed., Disp: , Rfl:    traZODone (DESYREL) 100 MG tablet, Take 1 tablet (100 mg total) by mouth at bedtime as needed for sleep., Disp: 90 tablet, Rfl: 1   TRELEGY ELLIPTA 100-62.5-25 MCG/INH AEPB, INHALE 1 PUFF INTO THE LUNGS DAILY, Disp: 180 each, Rfl: 0   triamcinolone cream (KENALOG) 0.1 %, Apply 1 application topically 2 (two) times daily., Disp: 30 g, Rfl: 0   albuterol (VENTOLIN HFA) 108 (90 Base) MCG/ACT inhaler, INHALE 2 PUFFS INTO THE LUNGS EVERY 6 HOURS AS NEEDED FOR WHEEZING OR  SHORTNESS OF BREATH (Patient not taking: Reported on 03/13/2021), Disp: 6.7 g, Rfl: 0   buPROPion (WELLBUTRIN XL) 150 MG 24 hr tablet, Take 1 tablet (150 mg total) by mouth daily., Disp: 90 tablet, Rfl: 1   omeprazole (PRILOSEC) 40 MG capsule, TAKE ONE CAPSULE BY MOUTH DAILY, Disp: 90 capsule, Rfl: 1   rosuvastatin (CRESTOR) 20 MG tablet, Take 1 tablet (20 mg total) by mouth daily., Disp: 90 tablet, Rfl: 1   Semaglutide,0.25 or 0.5MG /DOS, (OZEMPIC, 0.25 OR 0.5 MG/DOSE,) 2 MG/1.5ML SOPN, Take 0.25-0.5 mg by mouth once a week., Disp: 4.5 mL, Rfl: 1  No Known Allergies  I personally reviewed active problem list, medication list, allergies, family history, social history, health maintenance with the patient/caregiver today.   ROS  Constitutional: Negative for fever or weight change.  Respiratory: Negative for cough and shortness of breath.   Cardiovascular: Negative for chest pain or palpitations.  Gastrointestinal: Negative for abdominal pain, no bowel changes.  Musculoskeletal: Negative for gait problem or joint swelling.  Skin: Negative for rash.  Neurological: Negative for dizziness or headache.  No other specific complaints in a complete review of systems (except as listed in HPI above).   Objective  Vitals:   03/13/21 1330  BP: 118/76  Pulse: 90  Resp: 16  Temp: 97.8 F (36.6 C)  SpO2: 99%  Weight: 138 lb (62.6 kg)  Height: 4\' 9"  (1.448 m)    Body mass index is 29.86 kg/m.  Physical Exam  Constitutional: Patient appears well-developed and well-nourished.  No distress.  HEENT: head atraumatic, normocephalic, pupils equal and reactive to light, neck supple, throat within normal limits Cardiovascular: Normal rate, regular rhythm and normal heart sounds.  No murmur heard. No BLE edema. Pulmonary/Chest: Effort normal and breath sounds normal. No respiratory distress. Abdominal: Soft.  There is no tenderness. Skin: senile purpura on arms and legs  Psychiatric: Patient has a  normal mood and affect. behavior is normal. Judgment and thought content normal.   Recent Results (from the past 2160 hour(s))  POCT HgB A1C  Status: None   Collection Time: 03/13/21  1:41 PM  Result Value Ref Range   Hemoglobin A1C 5.4 4.0 - 5.6 %   HbA1c POC (<> result, manual entry)     HbA1c, POC (prediabetic range)     HbA1c, POC (controlled diabetic range)        PHQ2/9: Depression screen [facility] 2/9 03/13/2021 02/14/2021 12/11/2020 10/31/2020 09/17/2020  Decreased Interest 0 0 0 0 2  Down, Depressed, Hopeless 0 0 0 0 0  PHQ - 2 Score 0 0 0 0 2  Altered sleeping - 0 - 0 2  Tired, decreased energy - 0 - 0 2  Change in appetite - 0 - 0 0  Feeling bad or failure about yourself  - 0 - 0 0  Trouble concentrating - 0 - 0 0  Moving slowly or fidgety/restless - 0 - 0 0  Suicidal thoughts - 0 - 0 0  PHQ-9 Score - 0 - 0 6  Difficult doing work/chores - - - Not difficult at all -  Some recent data might be hidden    phq 9 is negative   Fall Risk: Fall Risk  03/13/2021 02/14/2021 12/11/2020 10/31/2020 09/17/2020  Falls in the past year? 0 0 0 0 1  Number falls in past yr: 0 0 0 0 0  Injury with Fall? 0 0 0 0 0  Comment - - - - -  Risk for fall due to : No Fall Risks - - - -  Follow up Falls prevention discussed - - - -     Functional Status Survey: Is the patient deaf or have difficulty hearing?: No Does the patient have difficulty seeing, even when wearing glasses/contacts?: No Does the patient have difficulty concentrating, remembering, or making decisions?: No Does the patient have difficulty walking or climbing stairs?: No Does the patient have difficulty dressing or bathing?: No Does the patient have difficulty doing errands alone such as visiting a doctor's office or shopping?: No    Assessment & Plan  1. Dyslipidemia associated with type 2 diabetes mellitus (HCC)  - POCT HgB A1C - Semaglutide,0.25 or 0.5MG /DOS, (OZEMPIC, 0.25 OR 0.5 MG/DOSE,) 2 MG/1.5ML SOPN; Take  0.25-0.5 mg by mouth once a week.  Dispense: 4.5 mL; Refill: 1  2. Senile purpura (HCC)   3. Major depression in remission (HCC)  - buPROPion (WELLBUTRIN XL) 150 MG 24 hr tablet; Take 1 tablet (150 mg total) by mouth daily.  Dispense: 90 tablet; Refill: 1  4. Centrilobular emphysema (HCC)   5. Asthma-COPD overlap syndrome (HCC)   6. Migraine without aura and without status migrainosus, not intractable   7. Thoracic aorta atherosclerosis (HCC)  - rosuvastatin (CRESTOR) 20 MG tablet; Take 1 tablet (20 mg total) by mouth daily.  Dispense: 90 tablet; Refill: 1  8. Dyslipidemia  - rosuvastatin (CRESTOR) 20 MG tablet; Take 1 tablet (20 mg total) by mouth daily.  Dispense: 90 tablet; Refill: 1  9. Gastroesophageal reflux disease without esophagitis  - omeprazole (PRILOSEC) 40 MG capsule; TAKE ONE CAPSULE BY MOUTH DAILY  Dispense: 90 capsule; Refill: 1  10. Insomnia due to mental disorder   11. Age-related osteoporosis without current pathological fracture   Ambulatory referral to Endocrinology  12. Adult onset hypothyroidism   13. Nocturnal leg cramps   14. RLS (restless legs syndrome)  - rOPINIRole (REQUIP) 0.5 MG tablet; Take 1-2 tablets (0.5-1 mg total) by mouth at bedtime.  Dispense: 60 tablet; Refill: 0  15. Dyslipidemia associated with type 2  diabetes mellitus (HCC)  - POCT HgB A1C - Semaglutide,0.25 or 0.5MG /DOS, (OZEMPIC, 0.25 OR 0.5 MG/DOSE,) 2 MG/1.5ML SOPN; Take 0.25-0.5 mg by mouth once a week.  Dispense: 4.5 mL; Refill: 1

## 2021-03-13 ENCOUNTER — Ambulatory Visit (INDEPENDENT_AMBULATORY_CARE_PROVIDER_SITE_OTHER): Payer: Medicare Other | Admitting: Family Medicine

## 2021-03-13 ENCOUNTER — Encounter: Payer: Self-pay | Admitting: Family Medicine

## 2021-03-13 ENCOUNTER — Other Ambulatory Visit: Payer: Self-pay

## 2021-03-13 ENCOUNTER — Other Ambulatory Visit: Payer: Self-pay | Admitting: Family Medicine

## 2021-03-13 ENCOUNTER — Telehealth: Payer: Self-pay

## 2021-03-13 VITALS — BP 118/76 | HR 90 | Temp 97.8°F | Resp 16 | Ht <= 58 in | Wt 138.0 lb

## 2021-03-13 DIAGNOSIS — E038 Other specified hypothyroidism: Secondary | ICD-10-CM | POA: Diagnosis not present

## 2021-03-13 DIAGNOSIS — J449 Chronic obstructive pulmonary disease, unspecified: Secondary | ICD-10-CM | POA: Diagnosis not present

## 2021-03-13 DIAGNOSIS — J432 Centrilobular emphysema: Secondary | ICD-10-CM

## 2021-03-13 DIAGNOSIS — K219 Gastro-esophageal reflux disease without esophagitis: Secondary | ICD-10-CM | POA: Diagnosis not present

## 2021-03-13 DIAGNOSIS — I700 Atherosclerosis of aorta: Secondary | ICD-10-CM

## 2021-03-13 DIAGNOSIS — D692 Other nonthrombocytopenic purpura: Secondary | ICD-10-CM | POA: Diagnosis not present

## 2021-03-13 DIAGNOSIS — E1169 Type 2 diabetes mellitus with other specified complication: Secondary | ICD-10-CM

## 2021-03-13 DIAGNOSIS — G43009 Migraine without aura, not intractable, without status migrainosus: Secondary | ICD-10-CM

## 2021-03-13 DIAGNOSIS — F5105 Insomnia due to other mental disorder: Secondary | ICD-10-CM

## 2021-03-13 DIAGNOSIS — M810 Age-related osteoporosis without current pathological fracture: Secondary | ICD-10-CM | POA: Diagnosis not present

## 2021-03-13 DIAGNOSIS — E785 Hyperlipidemia, unspecified: Secondary | ICD-10-CM | POA: Diagnosis not present

## 2021-03-13 DIAGNOSIS — F325 Major depressive disorder, single episode, in full remission: Secondary | ICD-10-CM

## 2021-03-13 DIAGNOSIS — G2581 Restless legs syndrome: Secondary | ICD-10-CM

## 2021-03-13 DIAGNOSIS — G4762 Sleep related leg cramps: Secondary | ICD-10-CM

## 2021-03-13 LAB — POCT GLYCOSYLATED HEMOGLOBIN (HGB A1C): Hemoglobin A1C: 5.4 % (ref 4.0–5.6)

## 2021-03-13 MED ORDER — OMEPRAZOLE 40 MG PO CPDR: DELAYED_RELEASE_CAPSULE | ORAL | 1 refills | Status: DC

## 2021-03-13 MED ORDER — ROPINIROLE HCL 0.5 MG PO TABS: 0.5000 mg | ORAL_TABLET | Freq: Every day | ORAL | 0 refills | Status: DC

## 2021-03-13 MED ORDER — OZEMPIC (0.25 OR 0.5 MG/DOSE) 2 MG/1.5ML ~~LOC~~ SOPN: 0.2500 mg | PEN_INJECTOR | SUBCUTANEOUS | 1 refills | Status: DC

## 2021-03-13 MED ORDER — BUPROPION HCL ER (XL) 150 MG PO TB24: 150.0000 mg | ORAL_TABLET | Freq: Every day | ORAL | 1 refills | Status: DC

## 2021-03-13 MED ORDER — ROSUVASTATIN CALCIUM 20 MG PO TABS: 20.0000 mg | ORAL_TABLET | Freq: Every day | ORAL | 1 refills | Status: DC

## 2021-03-13 NOTE — Patient Instructions (Signed)
Tennis Elbow Rehab ?Ask your health care provider which exercises are safe for you. Do exercises exactly as told by your health care provider and adjust them as directed. It is normal to feel mild stretching, pulling, tightness, or discomfort as you do these exercises. Stop right away if you feel sudden pain or your pain gets worse. Do not begin these exercises until told by your health care provider. ?Stretching and range-of-motion exercises ?These exercises warm up your muscles and joints and improve the movement and flexibility of your elbow. ?Wrist flexion, assisted ? ?Straighten your left / right elbow in front of you with your palm facing down toward the floor. ?If told by your health care provider, bend your left / right elbow to a 90-degree angle (right angle) at your side instead of holding it straight. ?With your other hand, gently push over the back of your left / right hand so your fingers point toward the floor (flexion). Stop when you feel a gentle stretch on the back of your forearm. ?Hold this position for __________ seconds. ?Repeat __________ times. Complete this exercise __________ times a day. ?Wrist extension, assisted ? ?Straighten your left / right elbow in front of you with your palm facing up toward the ceiling. ?If told by your health care provider, bend your left / right elbow to a 90-degree angle (right angle) at your side instead of holding it straight. ?With your other hand, gently pull your left / right hand and fingers toward the floor (extension). Stop when you feel a gentle stretch on the palm side of your forearm. ?Hold this position for __________ seconds. ?Repeat __________ times. Complete this exercise __________ times a day. ?Assisted forearm rotation, supination ?Sit or stand with your elbows at your side. ?Bend your left / right elbow to a 90-degree angle (right angle). ?Using your uninjured hand, turn your left / right palm up toward the ceiling (supination) until you feel a  gentle stretch along the inside of your forearm. ?Hold this position for __________ seconds. ?Repeat __________ times. Complete this exercise __________ times a day. ?Assisted forearm rotation, pronation ?Sit or stand with your elbows at your side. ?Bend your left / right elbow to a 90-degree angle (right angle). ?Using your uninjured hand, turn your left / right palm down toward the floor (pronation) until you feel a gentle stretch along the outside of your forearm. ?Hold this position for __________ seconds. ?Repeat __________ times. Complete this exercise __________ times a day. ?Strengthening exercises ?These exercises build strength and endurance in your forearm and elbow. Endurance is the ability to use your muscles for a long time, even after they get tired. ?Radial deviation ? ?Stand with a __________ weight or a hammer in your left / right hand. Or, sit while holding a rubber exercise band or tubing, with your left / right forearm supported on a table or countertop. ?Position your forearm so that the thumb is facing the ceiling, as if you are going to clap your hands. This is the neutral position. ?Raise your hand upward in front of you so your thumb moves toward the ceiling (radial deviation), or pull up on the rubber tubing. Keep your forearm and elbow still while you move your wrist only. ?Hold this position for __________ seconds. ?Slowly return to the starting position. ?Repeat __________ times. Complete this exercise __________ times a day. ?Wrist extension, eccentric ?Sit with your left / right forearm palm-down and supported on a table or other surface. Let your left /   right wrist extend over the edge of the surface. ?Hold a __________ weight or a piece of exercise band or tubing in your left / right hand. ?If using a rubber exercise band or tubing, hold the other end of the tubing with your other hand. ?Use your uninjured hand to move your left / right hand up toward the ceiling. ?Take your  uninjured hand away and slowly return to the starting position using only your left / right hand. Lowering your arm under tension is called eccentric extension. ?Repeat __________ times. Complete this exercise __________ times a day. ?Wrist extension ?Do not do this exercise if it causes pain at the outside of your elbow. Only do this exercise once instructed by your health care provider. ?Sit with your left / right forearm supported on a table or other surface and your palm turned down toward the floor. Let your left / right wrist extend over the edge of the surface. ?Hold a __________ weight or a piece of rubber exercise band or tubing. ?If you are using a rubber exercise band or tubing, hold the band or tubing in place with your other hand to provide resistance. ?Slowly bend your wrist so your hand moves up toward the ceiling (extension). Move only your wrist, keeping your forearm and elbow still. ?Hold this position for __________ seconds. ?Slowly return to the starting position. ?Repeat __________ times. Complete this exercise __________ times a day. ?Forearm rotation, supination ?To do this exercise, you will need a lightweight hammer or rubber mallet. ?Sit with your left / right forearm supported on a table or other surface. Bend your elbow to a 90-degree angle (right angle). Position your forearm so that your palm is facing down toward the floor, with your hand resting over the edge of the table. ?Hold a hammer in your left / right hand. ?To make this exercise easier, hold the hammer near the head of the hammer. ?To make this exercise harder, hold the hammer near the end of the handle. ?Without moving your wrist or elbow, slowly rotate your forearm so your palm faces up toward the ceiling (supination). ?Hold this position for __________ seconds. ?Slowly return to the starting position. ?Repeat __________ times. Complete this exercise __________ times a day. ?Shoulder blade squeeze ?Sit in a stable chair or  stand with good posture. If you are sitting down, do not let your back touch the back of the chair. ?Your arms should be at your sides with your elbows bent to a 90-degree angle (right angle). Position your forearms so that your thumbs are facing the ceiling (neutral position). ?Without lifting your shoulders up, squeeze your shoulder blades tightly together. ?Hold this position for __________ seconds. ?Slowly release and return to the starting position. ?Repeat __________ times. Complete this exercise __________ times a day. ?This information is not intended to replace advice given to you by your health care provider. Make sure you discuss any questions you have with your health care provider. ?Document Revised: 10/05/2019 Document Reviewed: 10/05/2019 ?Elsevier Patient Education ? 2022 [employer] ? ?

## 2021-03-13 NOTE — Telephone Encounter (Signed)
Copied from CRM [id number]. Topic: Quick Communication - Rx Refill/Question >> Mar 13, 2021  2:55 PM Aretta Nip wrote: Medication: Semaglutide,0.25 or 0.5MG /DOS, (OZEMPIC, 0.25 OR 0.5 MG/DOSE,) 2 MG/1.5ML SOPN 4.5 mL 1 03/13/2021   Sig - Route: Take 0.25-0.5 mg by mouth once a week. - Oral  Sent to pharmacy as: Semaglutide,0.25 or 0.5MG /DOS, (OZEMPIC, 0.25 OR 0.5 MG/DOSE,) 2 MG/1.5ML Solution Pen-injector  E-Prescribing Status: Receipt confirmed by pharmacy (03/13/2021 2:04 PM EDT)   Please resend script  pharmacy calling script reads by mouth, is an injection  Preferred Pharmacy (with phone number or street name): [id number] [place], [place] - [address] AT [facility] OF [address] [place] [address] [place] [address] Phone: [phone] Fax: [phone]    Agent: Please be advised that RX refills may take up to 3 business days. We ask that you follow-up with your pharmacy.

## 2021-04-17 ENCOUNTER — Other Ambulatory Visit: Payer: Self-pay | Admitting: Family Medicine

## 2021-04-17 DIAGNOSIS — E785 Hyperlipidemia, unspecified: Secondary | ICD-10-CM

## 2021-04-17 DIAGNOSIS — I700 Atherosclerosis of aorta: Secondary | ICD-10-CM

## 2021-04-17 NOTE — Telephone Encounter (Signed)
Last ordered on 03/13/21 #90 with one refill. TC to pharmacy to verify receipt-Patient p/u 90 day supply on 03/14/21 with an additional refill on file. This is a duplicate request-refused.

## 2021-05-01 DIAGNOSIS — M7711 Lateral epicondylitis, right elbow: Secondary | ICD-10-CM | POA: Diagnosis not present

## 2021-05-01 DIAGNOSIS — M7522 Bicipital tendinitis, left shoulder: Secondary | ICD-10-CM | POA: Diagnosis not present

## 2021-05-01 DIAGNOSIS — M19011 Primary osteoarthritis, right shoulder: Secondary | ICD-10-CM | POA: Diagnosis not present

## 2021-05-01 DIAGNOSIS — M7582 Other shoulder lesions, left shoulder: Secondary | ICD-10-CM | POA: Diagnosis not present

## 2021-05-01 DIAGNOSIS — M25511 Pain in right shoulder: Secondary | ICD-10-CM | POA: Diagnosis not present

## 2021-05-01 DIAGNOSIS — M19012 Primary osteoarthritis, left shoulder: Secondary | ICD-10-CM | POA: Diagnosis not present

## 2021-05-07 DIAGNOSIS — H2513 Age-related nuclear cataract, bilateral: Secondary | ICD-10-CM | POA: Diagnosis not present

## 2021-05-07 LAB — HM DIABETES EYE EXAM

## 2021-05-14 ENCOUNTER — Encounter: Payer: Self-pay | Admitting: General Surgery

## 2021-05-15 ENCOUNTER — Other Ambulatory Visit: Payer: Self-pay | Admitting: Surgery

## 2021-05-20 ENCOUNTER — Other Ambulatory Visit
Admission: RE | Admit: 2021-05-20 | Discharge: 2021-05-20 | Disposition: A | Discharge status: Home / Self Care | Payer: Medicare Other | Source: Ambulatory Visit | Attending: Surgery | Admitting: Surgery

## 2021-05-20 ENCOUNTER — Other Ambulatory Visit: Payer: Self-pay

## 2021-05-20 ENCOUNTER — Encounter
Admission: RE | Admit: 2021-05-20 | Discharge: 2021-05-20 | Disposition: A | Discharge status: Home / Self Care | Payer: Medicare Other | Source: Ambulatory Visit | Attending: Surgery | Admitting: Surgery

## 2021-05-20 VITALS — Ht <= 58 in | Wt 137.0 lb

## 2021-05-20 DIAGNOSIS — I10 Essential (primary) hypertension: Secondary | ICD-10-CM | POA: Diagnosis not present

## 2021-05-20 DIAGNOSIS — Z01818 Encounter for other preprocedural examination: Secondary | ICD-10-CM | POA: Diagnosis not present

## 2021-05-20 DIAGNOSIS — R739 Hyperglycemia, unspecified: Secondary | ICD-10-CM

## 2021-05-20 DIAGNOSIS — E1169 Type 2 diabetes mellitus with other specified complication: Secondary | ICD-10-CM | POA: Diagnosis not present

## 2021-05-20 DIAGNOSIS — Z01810 Encounter for preprocedural cardiovascular examination: Secondary | ICD-10-CM | POA: Diagnosis not present

## 2021-05-20 DIAGNOSIS — E785 Hyperlipidemia, unspecified: Secondary | ICD-10-CM

## 2021-05-20 LAB — BASIC METABOLIC PANEL
Anion gap: 6 (ref 5–15)
BUN: 22 mg/dL (ref 8–23)
CO2: 27 mmol/L (ref 22–32)
Calcium: 9.1 mg/dL (ref 8.9–10.3)
Chloride: 104 mmol/L (ref 98–111)
Creatinine, Ser: 0.91 mg/dL (ref 0.44–1.00)
GFR, Estimated: >60 mL/min (ref >60)
Glucose, Bld: 84 mg/dL (ref 70–99)
Potassium: 4.1 mmol/L (ref 3.5–5.1)
Sodium: 137 mmol/L (ref 135–145)

## 2021-05-20 MED ORDER — CHLORHEXIDINE GLUCONATE 0.12 % MT SOLN: 15.0000 mL | Freq: Once | OROMUCOSAL | Status: AC

## 2021-05-20 MED ORDER — ORAL CARE MOUTH RINSE: 15.0000 mL | Freq: Once | OROMUCOSAL | Status: AC

## 2021-05-20 MED ORDER — LACTATED RINGERS IV SOLN: INTRAVENOUS | Status: DC

## 2021-05-20 MED ORDER — CEFAZOLIN SODIUM-DEXTROSE 2-4 GM/100ML-% IV SOLN
2.0000 g | INTRAVENOUS | Status: AC
  Administered 2021-05-21: 2 g via INTRAVENOUS

## 2021-05-20 NOTE — Patient Instructions (Signed)
Your procedure is scheduled on: 05/21/21 Report to DAY SURGERY DEPARTMENT LOCATED ON 2ND FLOOR MEDICAL MALL ENTRANCE. To find out your arrival time please call [phone] between 1PM - 3PM on 05/20/21.  Remember: Instructions that are not followed completely may result in serious medical risk, up to and including death, or upon the discretion of your surgeon and anesthesiologist your surgery may need to be rescheduled.     _X__ 1. Do not eat food or drink liquids after midnight the night before your procedure.                 No gum chewing or hard candies.   __X__2.  On the morning of surgery brush your teeth with toothpaste and water, you                 may rinse your mouth with mouthwash if you wish.  Do not swallow any              toothpaste of mouthwash.     _X__ 3.  No Alcohol for 24 hours before or after surgery.   _X__ 4.  Do Not Smoke or use e-cigarettes For 24 Hours Prior to Your Surgery.                 Do not use any chewable tobacco products for at least 6 hours prior to                 surgery.  ____  5.  Bring all medications with you on the day of surgery if instructed.   __X__  6.  Notify your doctor if there is any change in your medical condition      (cold, fever, infections).     Do not wear jewelry, make-up, hairpins, clips or nail polish. Do not wear lotions, powders, or perfumes. NO DEODORANT Do not shave BODY HAIR 48 hours prior to surgery. Men may shave face and neck. Do not bring valuables to the hospital.    [facility] is not responsible for any belongings or valuables.  Contacts, dentures/partials or body piercings may not be worn into surgery. Bring a case for your contacts, glasses or hearing aids, a denture cup will be supplied. Leave your suitcase in the car. After surgery it may be brought to your room. For patients admitted to the hospital, discharge time is determined by your treatment team.   Patients discharged the day of surgery will not  be allowed to drive home.   Please read over the following fact sheets that you were given:   ChG SOAP  __X__ Take these medicines the morning of surgery with A SIP OF WATER:    1. buPROPion (WELLBUTRIN XL) 150 MG 24 hr tablet  2. DULoxetine (CYMBALTA) 60 MG capsule  3. levothyroxine (SYNTHROID) 75 MCG tablet  4. omeprazole (PRILOSEC) 40 MG capsule  5. rosuvastatin (CRESTOR) 20 MG tablet  6. valACYclovir (VALTREX) 500 MG tablet  ____ Fleet Enema (as directed)   __X__ Use CHG Soap/SAGE wipes as directed  __X__ Use inhalers on the day of surgery  USE YOUR NEBULIZER (DUONEB) AND THE TRELEGY ELLIPTA BEFORE ARRIVING FOR SURGERY  ____ Stop metformin/Janumet/Farxiga 2 days prior to surgery    ____ Take 1/2 of usual insulin dose the night before surgery. No insulin the morning          of surgery.   ____ Stop Blood Thinners Coumadin/Plavix/Xarelto/Pleta/Pradaxa/Eliquis/Effient/Aspirin  on   Or contact your Surgeon, Cardiologist or  Medical Doctor regarding  ability to stop your blood thinners  __X__ Stop Anti-inflammatories 7 days before surgery such as Advil, Ibuprofen, Motrin,  BC or Goodies Powder, Naprosyn, Naproxen, Aleve, Aspirin    __X__ Stop all herbal supplements, fish oil or vitamin E until after surgery.    ____ Bring C-Pap to the hospital.    HOLD MELATONIN TONIGHT

## 2021-05-21 ENCOUNTER — Encounter
Admission: RE | Disposition: A | Discharge status: Home / Self Care | Payer: Self-pay | Source: Ambulatory Visit | Attending: Surgery

## 2021-05-21 ENCOUNTER — Ambulatory Visit
Admission: RE | Admit: 2021-05-21 | Discharge: 2021-05-21 | Disposition: A | Discharge status: Home / Self Care | Payer: Medicare Other | Source: Ambulatory Visit | Attending: Surgery | Admitting: Surgery

## 2021-05-21 ENCOUNTER — Ambulatory Visit: Payer: Medicare Other

## 2021-05-21 ENCOUNTER — Ambulatory Visit: Payer: Medicare Other | Admitting: Certified Registered"

## 2021-05-21 DIAGNOSIS — G8918 Other acute postprocedural pain: Secondary | ICD-10-CM | POA: Diagnosis not present

## 2021-05-21 DIAGNOSIS — M25512 Pain in left shoulder: Secondary | ICD-10-CM | POA: Diagnosis not present

## 2021-05-21 DIAGNOSIS — M19012 Primary osteoarthritis, left shoulder: Secondary | ICD-10-CM | POA: Insufficient documentation

## 2021-05-21 DIAGNOSIS — Z419 Encounter for procedure for purposes other than remedying health state, unspecified: Secondary | ICD-10-CM

## 2021-05-21 DIAGNOSIS — M7582 Other shoulder lesions, left shoulder: Secondary | ICD-10-CM | POA: Diagnosis not present

## 2021-05-21 DIAGNOSIS — Z87891 Personal history of nicotine dependence: Secondary | ICD-10-CM | POA: Diagnosis not present

## 2021-05-21 DIAGNOSIS — Z794 Long term (current) use of insulin: Secondary | ICD-10-CM | POA: Insufficient documentation

## 2021-05-21 DIAGNOSIS — M75112 Incomplete rotator cuff tear or rupture of left shoulder, not specified as traumatic: Secondary | ICD-10-CM | POA: Diagnosis not present

## 2021-05-21 DIAGNOSIS — E119 Type 2 diabetes mellitus without complications: Secondary | ICD-10-CM | POA: Insufficient documentation

## 2021-05-21 DIAGNOSIS — M7522 Bicipital tendinitis, left shoulder: Secondary | ICD-10-CM | POA: Insufficient documentation

## 2021-05-21 DIAGNOSIS — M7712 Lateral epicondylitis, left elbow: Secondary | ICD-10-CM | POA: Diagnosis not present

## 2021-05-21 HISTORY — PX: SHOULDER ARTHROSCOPY WITH SUBACROMIAL DECOMPRESSION, ROTATOR CUFF REPAIR AND BICEP TENDON REPAIR: SHX5687

## 2021-05-21 LAB — GLUCOSE, CAPILLARY
Glucose-Capillary: 84 mg/dL (ref 70–99)
Glucose-Capillary: 108 mg/dL — ABNORMAL HIGH (ref 70–99)

## 2021-05-21 SURGERY — SHOULDER ARTHROSCOPY WITH SUBACROMIAL DECOMPRESSION, ROTATOR CUFF REPAIR AND BICEP TENDON REPAIR
Anesthesia: General | Site: Shoulder | Laterality: Left

## 2021-05-21 MED ORDER — ESMOLOL HCL 100 MG/10ML IV SOLN
INTRAVENOUS | Status: AC
  Filled 2021-05-21: qty 10

## 2021-05-21 MED ORDER — ONDANSETRON HCL 4 MG/2ML IJ SOLN: 4.0000 mg | Freq: Once | INTRAMUSCULAR | Status: DC | PRN

## 2021-05-21 MED ORDER — LACTATED RINGERS IV SOLN: INTRAVENOUS | Status: DC | PRN

## 2021-05-21 MED ORDER — ONDANSETRON HCL 4 MG/2ML IJ SOLN
INTRAMUSCULAR | Status: DC | PRN
  Administered 2021-05-21: 4 mg via INTRAVENOUS

## 2021-05-21 MED ORDER — BUPIVACAINE LIPOSOME 1.3 % IJ SUSP
INTRAMUSCULAR | Status: DC | PRN
  Administered 2021-05-21: 20 mL via PERINEURAL

## 2021-05-21 MED ORDER — CEFAZOLIN SODIUM-DEXTROSE 2-4 GM/100ML-% IV SOLN
INTRAVENOUS | Status: AC
  Filled 2021-05-21: qty 100

## 2021-05-21 MED ORDER — MIDAZOLAM HCL 2 MG/2ML IJ SOLN: 1.0000 mg | Freq: Once | INTRAMUSCULAR | Status: AC

## 2021-05-21 MED ORDER — FENTANYL CITRATE (PF) 100 MCG/2ML IJ SOLN
INTRAMUSCULAR | Status: DC | PRN
  Administered 2021-05-21 (×2): 50 ug via INTRAVENOUS

## 2021-05-21 MED ORDER — MIDAZOLAM HCL 2 MG/2ML IJ SOLN
INTRAMUSCULAR | Status: AC
  Administered 2021-05-21: 1 mg via INTRAVENOUS
  Filled 2021-05-21: qty 2

## 2021-05-21 MED ORDER — LIDOCAINE HCL (CARDIAC) PF 100 MG/5ML IV SOSY
PREFILLED_SYRINGE | INTRAVENOUS | Status: DC | PRN
  Administered 2021-05-21: 30 mg via INTRAVENOUS

## 2021-05-21 MED ORDER — METOCLOPRAMIDE HCL 5 MG/ML IJ SOLN: 5.0000 mg | Freq: Three times a day (TID) | INTRAMUSCULAR | Status: DC | PRN

## 2021-05-21 MED ORDER — BUPIVACAINE LIPOSOME 1.3 % IJ SUSP
INTRAMUSCULAR | Status: AC
  Filled 2021-05-21: qty 20

## 2021-05-21 MED ORDER — FENTANYL CITRATE PF 50 MCG/ML IJ SOSY
PREFILLED_SYRINGE | INTRAMUSCULAR | Status: AC
  Administered 2021-05-21: 50 ug via INTRAVENOUS
  Filled 2021-05-21: qty 1

## 2021-05-21 MED ORDER — LIDOCAINE HCL (PF) 1 % IJ SOLN
INTRAMUSCULAR | Status: AC
  Filled 2021-05-21: qty 5

## 2021-05-21 MED ORDER — LIDOCAINE HCL (PF) 1 % IJ SOLN
INTRAMUSCULAR | Status: DC | PRN
  Administered 2021-05-21: 3 mL

## 2021-05-21 MED ORDER — EPINEPHRINE PF 1 MG/ML IJ SOLN
INTRAMUSCULAR | Status: AC
  Filled 2021-05-21: qty 4

## 2021-05-21 MED ORDER — ONDANSETRON HCL 4 MG/2ML IJ SOLN: 4.0000 mg | Freq: Four times a day (QID) | INTRAMUSCULAR | Status: DC | PRN

## 2021-05-21 MED ORDER — FENTANYL CITRATE (PF) 100 MCG/2ML IJ SOLN: 25.0000 ug | INTRAMUSCULAR | Status: DC | PRN

## 2021-05-21 MED ORDER — FENTANYL CITRATE PF 50 MCG/ML IJ SOSY: 50.0000 ug | PREFILLED_SYRINGE | Freq: Once | INTRAMUSCULAR | Status: AC

## 2021-05-21 MED ORDER — PHENYLEPHRINE HCL (PRESSORS) 10 MG/ML IV SOLN
INTRAVENOUS | Status: DC | PRN
  Administered 2021-05-21: 100 ug via INTRAVENOUS

## 2021-05-21 MED ORDER — SODIUM CHLORIDE 0.9 % IV SOLN: INTRAVENOUS | Status: DC

## 2021-05-21 MED ORDER — OXYCODONE HCL 5 MG PO TABS: 5.0000 mg | ORAL_TABLET | ORAL | Status: DC | PRN

## 2021-05-21 MED ORDER — KETOROLAC TROMETHAMINE 15 MG/ML IJ SOLN: 15.0000 mg | Freq: Once | INTRAMUSCULAR | Status: DC

## 2021-05-21 MED ORDER — FENTANYL CITRATE (PF) 100 MCG/2ML IJ SOLN
INTRAMUSCULAR | Status: AC
  Filled 2021-05-21: qty 4

## 2021-05-21 MED ORDER — ESMOLOL HCL 100 MG/10ML IV SOLN
INTRAVENOUS | Status: DC | PRN
  Administered 2021-05-21: 10 mg via INTRAVENOUS
  Administered 2021-05-21 (×2): 20 mg via INTRAVENOUS

## 2021-05-21 MED ORDER — ACETAMINOPHEN 10 MG/ML IV SOLN
INTRAVENOUS | Status: DC | PRN
  Administered 2021-05-21: 1000 mg via INTRAVENOUS

## 2021-05-21 MED ORDER — ONDANSETRON HCL 4 MG PO TABS: 4.0000 mg | ORAL_TABLET | Freq: Four times a day (QID) | ORAL | Status: DC | PRN

## 2021-05-21 MED ORDER — PROPOFOL 10 MG/ML IV BOLUS
INTRAVENOUS | Status: AC
  Filled 2021-05-21: qty 20

## 2021-05-21 MED ORDER — BUPIVACAINE HCL (PF) 0.5 % IJ SOLN
INTRAMUSCULAR | Status: DC | PRN
  Administered 2021-05-21: 10 mL via PERINEURAL

## 2021-05-21 MED ORDER — BUPIVACAINE HCL (PF) 0.5 % IJ SOLN
INTRAMUSCULAR | Status: AC
  Filled 2021-05-21: qty 10

## 2021-05-21 MED ORDER — BUPIVACAINE-EPINEPHRINE (PF) 0.5% -1:200000 IJ SOLN
INTRAMUSCULAR | Status: AC
  Filled 2021-05-21: qty 30

## 2021-05-21 MED ORDER — BUPIVACAINE-EPINEPHRINE 0.5% -1:200000 IJ SOLN
INTRAMUSCULAR | Status: DC | PRN
  Administered 2021-05-21: 30 mL

## 2021-05-21 MED ORDER — METOCLOPRAMIDE HCL 10 MG PO TABS: 5.0000 mg | ORAL_TABLET | Freq: Three times a day (TID) | ORAL | Status: DC | PRN

## 2021-05-21 MED ORDER — DEXAMETHASONE SODIUM PHOSPHATE 10 MG/ML IJ SOLN
INTRAMUSCULAR | Status: DC | PRN
  Administered 2021-05-21: 8 mg via INTRAVENOUS

## 2021-05-21 MED ORDER — SUGAMMADEX SODIUM 200 MG/2ML IV SOLN
INTRAVENOUS | Status: DC | PRN
  Administered 2021-05-21: 200 mg via INTRAVENOUS

## 2021-05-21 MED ORDER — ACETAMINOPHEN 10 MG/ML IV SOLN
INTRAVENOUS | Status: AC
  Filled 2021-05-21: qty 100

## 2021-05-21 MED ORDER — ROCURONIUM BROMIDE 100 MG/10ML IV SOLN
INTRAVENOUS | Status: DC | PRN
  Administered 2021-05-21: 50 mg via INTRAVENOUS

## 2021-05-21 MED ORDER — CHLORHEXIDINE GLUCONATE 0.12 % MT SOLN
OROMUCOSAL | Status: AC
  Administered 2021-05-21: 15 mL via OROMUCOSAL
  Filled 2021-05-21: qty 15

## 2021-05-21 MED ORDER — PROPOFOL 10 MG/ML IV BOLUS
INTRAVENOUS | Status: DC | PRN
  Administered 2021-05-21: 150 mg via INTRAVENOUS

## 2021-05-21 MED ORDER — OXYCODONE HCL 5 MG PO TABS: 5.0000 mg | ORAL_TABLET | ORAL | 0 refills | Status: DC | PRN

## 2021-05-21 SURGICAL SUPPLY — 56 items
ANCH SUT 2 2.9 2 LD TPR NDL (Anchor) ×1 IMPLANT
ANCH SUT BN ASCP DLV (Anchor) ×1 IMPLANT
ANCH SUT RGNRT REGENETEN (Staple) ×1 IMPLANT
ANCHOR BONE REGENETEN (Anchor) ×1 IMPLANT
ANCHOR JUGGERKNOT WTAP NDL 2.9 (Anchor) ×1 IMPLANT
ANCHOR TENDON REGENETEN (Staple) ×2 IMPLANT
APL PRP STRL LF DISP 70% ISPRP (MISCELLANEOUS) ×1
BIT DRILL JUGRKNT W/NDL BIT2.9 (DRILL) IMPLANT
BLADE FULL RADIUS 3.5 (BLADE) ×1 IMPLANT
BUR ACROMIONIZER 4.0 (BURR) IMPLANT
CANNULA SHAVER 8MMX76MM (CANNULA) ×2 IMPLANT
CHLORAPREP W/TINT 26 (MISCELLANEOUS) ×2 IMPLANT
COVER MAYO STAND REUSABLE (DRAPES) ×2 IMPLANT
DRAPE IMP U-DRAPE 54X76 (DRAPES) ×4 IMPLANT
DRILL JUGGERKNOT W/NDL BIT 2.9 (DRILL) ×2
ELECT CAUTERY BLADE 6.4 (BLADE) ×2 IMPLANT
ELECT REM PT RETURN 9FT ADLT (ELECTROSURGICAL) ×2
ELECTRODE REM PT RTRN 9FT ADLT (ELECTROSURGICAL) ×1 IMPLANT
GAUZE SPONGE 4X4 12PLY STRL (GAUZE/BANDAGES/DRESSINGS) ×2 IMPLANT
GAUZE XEROFORM 1X8 LF (GAUZE/BANDAGES/DRESSINGS) ×2 IMPLANT
GLOVE SRG 8 PF TXTR STRL LF DI (GLOVE) ×1 IMPLANT
GLOVE SURG ENC MOIS LTX SZ7.5 (GLOVE) ×4 IMPLANT
GLOVE SURG ENC MOIS LTX SZ8 (GLOVE) ×4 IMPLANT
GLOVE SURG UNDER LTX SZ8 (GLOVE) ×2 IMPLANT
GLOVE SURG UNDER POLY LF SZ8 (GLOVE) ×2
GOWN STRL REUS W/ TWL LRG LVL3 (GOWN DISPOSABLE) ×1 IMPLANT
GOWN STRL REUS W/ TWL XL LVL3 (GOWN DISPOSABLE) ×1 IMPLANT
GOWN STRL REUS W/TWL LRG LVL3 (GOWN DISPOSABLE) ×2
GOWN STRL REUS W/TWL XL LVL3 (GOWN DISPOSABLE) ×2
GRASPER SUT 15 45D LOW PRO (SUTURE) IMPLANT
IMPL REGENETEN MEDIUM (Shoulder) IMPLANT
IMPLANT REGENETEN MEDIUM (Shoulder) ×2 IMPLANT
IV LACTATED RINGER IRRG 3000ML (IV SOLUTION) ×4
IV LR IRRIG 3000ML ARTHROMATIC (IV SOLUTION) ×2 IMPLANT
KIT CANNULA 8X76-LX IN CANNULA (CANNULA) IMPLANT
MANIFOLD NEPTUNE II (INSTRUMENTS) ×4 IMPLANT
MASK FACE SPIDER DISP (MASK) ×2 IMPLANT
MAT ABSORB  FLUID 56X50 GRAY (MISCELLANEOUS) ×2
MAT ABSORB FLUID 56X50 GRAY (MISCELLANEOUS) ×1 IMPLANT
PACK ARTHROSCOPY SHOULDER (MISCELLANEOUS) ×2 IMPLANT
PASSER SUT FIRSTPASS SELF (INSTRUMENTS) IMPLANT
SLING ARM LRG DEEP (SOFTGOODS) ×1 IMPLANT
SLING ULTRA II LG (MISCELLANEOUS) ×1 IMPLANT
SLING ULTRA II M (MISCELLANEOUS) ×1 IMPLANT
SPONGE T-LAP 18X18 ~~LOC~~+RFID (SPONGE) ×2 IMPLANT
STAPLER SKIN PROX 35W (STAPLE) ×2 IMPLANT
STRAP SAFETY 5IN WIDE (MISCELLANEOUS) ×2 IMPLANT
SUT ETHIBOND 0 MO6 C/R (SUTURE) ×2 IMPLANT
SUT ULTRABRAID 2 COBRAID 38 (SUTURE) IMPLANT
SUT VIC AB 2-0 CT1 27 (SUTURE) ×4
SUT VIC AB 2-0 CT1 TAPERPNT 27 (SUTURE) ×2 IMPLANT
TAPE MICROFOAM 4IN (TAPE) ×2 IMPLANT
TUBING CONNECTING 10 (TUBING) ×2 IMPLANT
TUBING INFLOW SET DBFLO PUMP (TUBING) ×2 IMPLANT
WAND WEREWOLF FLOW 90D (MISCELLANEOUS) ×2 IMPLANT
WATER STERILE IRR 500ML POUR (IV SOLUTION) ×2 IMPLANT

## 2021-05-21 NOTE — Anesthesia Procedure Notes (Signed)
Procedure Name: Intubation Date/Time: 05/21/2021 1:09 PM Performed by: Jaye Beagle, CRNA Pre-anesthesia Checklist: Patient identified, Emergency Drugs available, Suction available and Patient being monitored Patient Re-evaluated:Patient Re-evaluated prior to induction Oxygen Delivery Method: Circle system utilized Preoxygenation: Pre-oxygenation with 100% oxygen Induction Type: IV induction Ventilation: Mask ventilation without difficulty Laryngoscope Size: McGraph and 3 Grade View: Grade I Tube type: Oral Tube size: 7.0 mm Number of attempts: 1 Airway Equipment and Method: Stylet and Oral airway Placement Confirmation: ETT inserted through vocal cords under direct vision, positive ETCO2 and breath sounds checked- equal and bilateral Secured at: 21 cm Tube secured with: Tape Dental Injury: Teeth and Oropharynx as per pre-operative assessment

## 2021-05-21 NOTE — Anesthesia Preprocedure Evaluation (Signed)
Anesthesia Evaluation  Patient identified by MRN, date of birth, ID band Patient awake    Reviewed: Allergy & Precautions, NPO status , Patient's Chart, lab work & pertinent test results  History of Anesthesia Complications Negative for: history of anesthetic complications  Airway Mallampati: II  TM Distance: >3 FB Neck ROM: Full    Dental  (+) Edentulous Upper, Missing, Poor Dentition   Pulmonary asthma , neg sleep apnea, COPD,  COPD inhaler, former smoker,    breath sounds clear to auscultation- rhonchi (-) wheezing      Cardiovascular Exercise Tolerance: Good (-) hypertension(-) CAD, (-) Past MI, (-) Cardiac Stents and (-) CABG  Rhythm:Regular Rate:Normal - Systolic murmurs and - Diastolic murmurs    Neuro/Psych neg Seizures PSYCHIATRIC DISORDERS Anxiety Depression negative neurological ROS     GI/Hepatic Neg liver ROS, GERD  ,  Endo/Other  diabetes, Insulin DependentHypothyroidism   Renal/GU negative Renal ROS     Musculoskeletal  (+) Arthritis ,   Abdominal (+) - obese,   Peds  Hematology negative hematology ROS (+)   Anesthesia Other Findings Past Medical History: No date: Anxiety No date: Arthritis     Comment:  "hips, hands, back" (03/10/2016) No date: Asthma No date: Chronic bronchitis (HCC) No date: COPD (chronic obstructive pulmonary disease) (HCC) No date: Cough No date: Depression No date: Esophagitis, reflux No date: GERD (gastroesophageal reflux disease) No date: Hyperlipidemia No date: Hypothyroidism No date: IBS (irritable bowel syndrome) No date: Lumbago No date: Muscle pain No date: Osteoarthritis No date: Sinus disorder No date: Thyroid disease No date: Type 2 diabetes, diet controlled (HCC) No date: Uncomplicated herpes simplex   Reproductive/Obstetrics                             Anesthesia Physical Anesthesia Plan  ASA: 3  Anesthesia Plan: General    Post-op Pain Management:  Regional for Post-op pain   Induction: Intravenous  PONV Risk Score and Plan: 2 and Ondansetron, Dexamethasone and Treatment may vary due to age or medical condition  Airway Management Planned: Oral ETT  Additional Equipment:   Intra-op Plan:   Post-operative Plan: Extubation in OR  Informed Consent: I have reviewed the patients History and Physical, chart, labs and discussed the procedure including the risks, benefits and alternatives for the proposed anesthesia with the patient or authorized representative who has indicated his/her understanding and acceptance.     Dental advisory given  Plan Discussed with: CRNA and Anesthesiologist  Anesthesia Plan Comments:         Anesthesia Quick Evaluation

## 2021-05-21 NOTE — Anesthesia Procedure Notes (Signed)
Anesthesia Regional Block: Interscalene brachial plexus block   Pre-Anesthetic Checklist: , timeout performed,  Correct Patient, Correct Site, Correct Laterality,  Correct Procedure, Correct Position, site marked,  Risks and benefits discussed,  Surgical consent,  Pre-op evaluation,  At surgeon's request and post-op pain management  Laterality: Left  Prep: chloraprep       Needles:  Injection technique: Single-shot  Needle Type: Stimiplex     Needle Length: 10cm  Needle Gauge: 21     Additional Needles:   Procedures:,,,, ultrasound used (permanent image in chart),,    Narrative:  Start time: 05/21/2021 12:10 PM End time: 05/21/2021 12:17 PM Injection made incrementally with aspirations every 5 mL.  Performed by: Personally  Anesthesiologist: Alver Fisher, MD  Additional Notes: Functioning IV was confirmed and monitors were applied.  A Stimuplex needle was used. Sterile prep and drape,hand hygiene and sterile gloves were used.  Negative aspiration and negative test dose prior to incremental administration of local anesthetic. The patient tolerated the procedure well.

## 2021-05-21 NOTE — Transfer of Care (Signed)
Immediate Anesthesia Transfer of Care Note  Patient: Sara Andrews  Procedure(s) Performed: SHOULDER ARTHROSCOPY WITH DEBRIDEMENT, DECOMPRESSION, REPAIR OF PARTIAL THICKNESS ROTATOR CUFF REPAIR, BICEPS TENODESIS (Left: Shoulder)  Patient Location: PACU  Anesthesia Type:General  Level of Consciousness: drowsy  Airway & Oxygen Therapy: Patient Spontanous Breathing and Patient connected to face mask oxygen  Post-op Assessment: Report given to RN  Post vital signs: stable  Last Vitals:  Vitals Value Taken Time  BP 118/66 05/21/21 1451  Temp    Pulse 106 05/21/21 1454  Resp 20 05/21/21 1454  SpO2 100 % 05/21/21 1454  Vitals shown include unvalidated device data.  Last Pain:  Vitals:   05/21/21 1122  TempSrc: Temporal  PainSc: 0-No pain         Complications: No notable events documented.

## 2021-05-21 NOTE — Progress Notes (Signed)
Called into OR and spoke with Tiffany RN, patient has 2 sores/cuts on the operative side. Not actively bleeding.

## 2021-05-21 NOTE — H&P (Signed)
History of Present Illness: Sara Andrews is a 65 y.o. female who presents today for follow-up of her continued left shoulder discomfort. The patient has been evaluated the past and has been diagnosed with partial-thickness rotator cuff tears in addition to biceps tendinitis and underlying osteoarthritic changes. The patient did undergo an MRI scan and has undergone 2 separate left subacromial steroid injections as she has been hopeful to avoid any surgical intervention. He states that the most recent injection did not provide any significant relief, and may have lasted for 2-3 weeks at a maximum according to her. She denies any trauma or injury affecting left arm. She does have continued increased discomfort when trying to reach above her head and out to the side. The patient states that the left shoulder is began to limit the activities that she can perform at home. Pain score in the left shoulder is a 8 out of 10 at today's visit. Pain score in the right shoulder is a 6 out of 10.  Past Medical History:  Asthma without status asthmaticus, unspecified   Chronic obstructive pulmonary disease (CMS-HCC) 10/05/2013  Last Assessment & Plan: Long history of smoking for 35 years. She does take albuterol periodically for symptoms. Suspect this could be contributing to her shortness of breath symptoms as well as deconditioning.   COPD (chronic obstructive pulmonary disease) (CMS-HCC)   Depression   GERD (gastroesophageal reflux disease)   Hyperglycemia 08/07/2016   Hyperlipidemia   Hypothyroidism 10/05/2013   Moderate episode of recurrent major depressive disorder (08/07/2016)   Right wrist pain 02/26/2017   Spinal stenosis of lumbar region with neurogenic claudication 10/28/2016   Past Surgical History:  ENDOSCOPIC CARPAL TUNNEL RELEASE Left 1996   FRACTURE SURGERY Left 2017 (Knee and plate)   HYSTERECTOMY   Rotator cuff repair Right 2012   Past Family History: History reviewed. No pertinent family  history.  Medications: Current Outpatient Medications Ordered in Epic  Medication Sig Dispense Refill   albuterol (PROVENTIL) 2.5 mg /3 mL (0.083 %) nebulizer solution Take 3 mLs (2.5 mg total) by nebulization every 6 (six) hours as needed for wheezing or shortness of breath.   albuterol 90 mcg/actuation inhaler Inhale 2 inhalations into the lungs every 6 (six) hours as needed   ascorbic acid, vitamin C, (VITAMIN C) 1000 MG tablet Take 1,000 mg by mouth once daily   beclomethasone (QVAR) 80 mcg/actuation inhaler Inhale into the lungs   BEVESPI AEROSPHERE 9-4.8 mcg inhaler INL 2 PFS ITL BID 2   buPROPion (WELLBUTRIN XL) 150 MG XL tablet Take 150 mg by mouth once daily   DULoxetine (CYMBALTA) 60 MG DR capsule Take 1 capsule by mouth once daily   levothyroxine (SYNTHROID) 75 MCG tablet Take 1 tablet by mouth once daily   loratadine (CLARITIN) 10 mg tablet Take by mouth Take 1 tablet (10 mg total) by mouth at bedtime.   meloxicam (MOBIC) 15 MG tablet TAKE 1 TABLET(15 MG) BY MOUTH EVERY DAY 90 tablet 1   montelukast (SINGULAIR) 10 mg tablet TAKE 1 TABLET BY MOUTH EVERY DAY   omeprazole (PRILOSEC) 40 MG DR capsule Take 1 capsule by mouth continuously as needed   rosuvastatin (CRESTOR) 20 MG tablet Take 20 mg by mouth once daily   semaglutide (OZEMPIC) 0.25 mg or 0.5 mg(2 mg/1.5 mL) pen injector INJECT 0.25 OR 0.5 MG INTO THE SKIN ONCE A WEEK AS DIRECTED   SUMAtriptan (IMITREX) 50 MG tablet May repeat in 2 hours if headache persists or recurs. Do not take  more than 2 in 24 hours.   traMADoL (ULTRAM) 50 mg tablet Take by mouth   traZODone (DESYREL) 100 MG tablet Take 100 mg by mouth nightly 3   TRELEGY ELLIPTA 100-62.5-25 mcg inhaler Inhale 1 inhalation into the lungs once daily   triamcinolone 0.1 % cream Apply topically 2 (two) times daily 45 g 0   valACYclovir (VALTREX) 500 MG tablet   VITAMIN D3 ORAL Take 2,000 Units by mouth once daily   DULoxetine (CYMBALTA) 30 MG DR capsule Take 3 capsules  by mouth once daily. (Patient not taking: Reported on 05/01/2021)   levothyroxine (SYNTHROID, LEVOTHROID) 200 MCG tablet Take 200 mcg by mouth once daily Take on an empty stomach with a glass of water at least 30-60 minutes before breakfast. (Patient not taking: Reported on 05/01/2021)   pregabalin (LYRICA) 50 MG capsule Take 50 mg by mouth 3 (three) times daily (Patient not taking: Reported on 05/01/2021)   Allergies: No Known Allergies   Review of Systems:  A comprehensive 14 point ROS was performed, reviewed by me today, and the pertinent orthopaedic findings are documented in the HPI.  Physical Exam: BP 110/80  Ht 144.8 cm (4\' 9" )  Wt 62.5 kg (137 lb 12.8 oz)  BMI 29.82 kg/m  General/Constitutional: The patient appears to be well-nourished, well-developed, and in no acute distress. Neuro/Psych: Normal mood and affect, oriented to person, place and time. Eyes: Non-icteric. Pupils are equal, round, and reactive to light, and exhibit synchronous movement. ENT: Unremarkable. Lymphatic: No palpable adenopathy. Respiratory: Lungs clear to auscultation, Normal chest excursion, No wheezes and Non-labored breathing Cardiovascular: Regular rate and rhythm. No murmurs. and No edema, swelling or tenderness, except as noted in detailed exam. Integumentary: No impressive skin lesions present, except as noted in detailed exam. Musculoskeletal: Unremarkable, except as noted in detailed exam.  General: Well developed, well nourished 65 y.o. female in no apparent distress. Normal affect. Normal communication. Patient answers questions appropriately. The patient has a normal gait. There is no antalgic component. There is no hip lurch.   Left Upper Extremity: Examination of the left shoulder and arm showed no bony abnormality or edema. The patient does have full passive range of motion to the left upper extremity. Actively she is able to reach 120 degrees forward flexion, 110 degrees of abduction. With  the left arm abducted 90 degrees she can tolerate external rotation up to 90 degrees, internal rotation 70 degrees. The patient has moderate tenderness at the extreme of motion. The patient has a positive Hawkins test and a positive Impingement test. The patient has a positive drop arm test. There is moderate subacromial space tenderness with no AC joint tenderness. The patient has no instability of the shoulder with anterior-posterior motion. There is a negative sulcus sign. The rotator cuff muscle strength is 4/5 with supraspinatus, 4/5 with internal rotation, and 4/5 with external rotation. There is no crepitus with range of motion activities.   Neurological: The patient has sensation that is intact to light touch and pinprick bilaterally. The patient has normal grip strength. The patient has full biceps, wrist extension, grip, and interosseous strength. The patient has 2 + DTRs bilaterally.  Vascular: The patient has less than 2 second capillary refill. The patient has normal ulnar and radial pulses. The patient has normal warmth to touch.   Imaging: Previous x-rays of the left shoulder were obtained by the [facility] clinic walk-in clinic. These x-rays demonstrate what appears to be mild arthritic changes on the inferior aspect of  glenohumeral joint with a small osteophyte formation off the inferior aspect of the humeral head. No acute fractures visualized. No collapse of the humeral head. Subacromial space appears to be well-maintained. There does appear to be some swelling noted to the left [facility] joint.  MRI OF THE LEFT SHOULDER:  1. Severe tendinopathy along the infraspinatus supraspinatus  junction region. There is also a deep articular surface tear with  maximum laminar retraction of the articular fibers estimated at 16  mm.  2. Interstitial tears noted at the footprint attachment site of the  supraspinatus tendon anteriorly.  3. Intact long head biceps tendon. Tendinopathy involving the   intra-articular portion.  4. Moderate AC joint degenerative changes and mild lateral  downsloping of the acromion may contribute to bony impingement.  5. Mild to moderate glenohumeral joint degenerative changes with  small joint effusion and synovitis.  6. Mild subacromial/subdeltoid bursitis and moderate complex  subcoracoid bursitis.   Impression: 1. Rotator cuff tendonitis, left 2. Biceps tendonitis on left 3. Primary osteoarthritis of left shoulder  Plan:  1. Treatment options were discussed today with the patient. 2. The patient does continue to experience ongoing left shoulder discomfort especially with range of motion activities. She is quite frustrated by her continued left shoulder pain. 3. Discussed the risk and benefits of aggressive versus conservative treatment options. The patient would like to undergo more aggressive treatment at this time. The patient will be scheduled for a left shoulder arthroscopy with debridement, decompression, possible rotator cuff repair and possible biceps tenodesis. Did instruct the patient that I believe this would be beneficial as she does not have any crepitus with range of motion activities of the left shoulder, did discuss the possibility of a left shoulder replacement procedure in the future if arthritic changes progresses. The patient verbalizes her understanding and wishes to proceed at this time. Surgery will be scheduled with Dr. Joice Lofts. 6. This document will serve as a surgical history physical for the patient. 7. The patient can contact the clinic if she has any questions, new symptoms develop or symptoms worsen.   H&P reviewed and patient re-examined. No changes.

## 2021-05-21 NOTE — Op Note (Signed)
05/21/2021  2:40 PM  Patient:   Sara Andrews  Pre-Op Diagnosis:   Impingement/tendinopathy with partial-thickness rotator cuff tear and underlying degenerative joint disease, left shoulder.  Post-Op Diagnosis:   Impingement/tendinopathy with partial-thickness rotator cuff tear, degenerative labral fraying, degenerative joint disease, and biceps tendinopathy, left shoulder.  Procedure:   Extensive arthroscopic debridement, mini-open subacromial decompression, mini open rotator cuff repair using the [employer] Regeneten patch, and mini-open biceps tenodesis, left shoulder.  Anesthesia:   General endotracheal with interscalene block using Exparel placed preoperatively by the anesthesiologist.  Surgeon:   Maryagnes Amos, MD  Assistant:   Horris Latino, PA-C  Findings:   As above. There was an articular-sided partial-thickness tear involving approximately 15 to 20% of the footprint of the anterior and mid insertional fibers of the supraspinatus tendon. The remaining portions of the rotator cuff appear to be in satisfactory condition. There was extensive degenerative fraying of the labrum superiorly and posterosuperiorly, as well as grade 3 chondromalacial changes involving the glenoid and humeral articular surfaces with multiple small loose fragments of articular cartilage floating in the joint. The biceps tendon demonstrated significant tendinopathy with partial-thickness tearing.  Complications:   None  Fluids:   1000 cc  Estimated blood loss:   10 cc  Tourniquet time:   None  Drains:   None  Closure:   Staples      Brief clinical note:   The patient is a 65 year old female with a long history progressively worsening left shoulder pain. The patient's symptoms have progressed despite medications, activity modification, etc. The patient's history and examination are consistent with impingement/tendinopathy with a rotator cuff tear. These findings were confirmed by MRI scan. The  patient presents at this time for definitive management of these shoulder symptoms.  Procedure:   The patient underwent placement of an interscalene block by the anesthesiologist in the preoperative holding area before being brought into the operating room and lain in the supine position. The patient then underwent general endotracheal intubation and anesthesia before being repositioned in the beach chair position using the beach chair positioner. The left shoulder and upper extremity were prepped with ChloraPrep solution before being draped sterilely. Preoperative antibiotics were administered. A timeout was performed to confirm the proper surgical site before the expected portal sites and incision site were injected with 0.5% Sensorcaine with epinephrine.   A posterior portal was created and the glenohumeral joint thoroughly inspected with the findings as described above. An anterior portal was created using an outside-in technique. The labrum and rotator cuff were further probed, again confirming the above-noted findings. The areas of degenerative labral fraying were debrided back to stable margins using the full-radius resector. In addition, fragments of loose articular cartilage fragments were debrided from the joint, as were areas of grade 3 chondromalacial changes involving the humeral head and glenoid. Finally, areas of extensive synovitis anteriorly and superiorly were debrided back to stable margins, as was the torn portion of the supraspinatus tendon, again using the full-radius resector. The ArthroCare wand was inserted and used to release the biceps tendon from its labral anchor. It also was used to obtain hemostasis as well as to "anneal" the labrum superiorly and anteriorly. The instruments were removed from the joint after suctioning the excess fluid.  An approximately 4-5 cm incision was made over the anterolateral aspect of the shoulder beginning at the anterolateral corner of the acromion  and extending distally in line with the bicipital groove. This incision was carried down through  the subcutaneous tissues to expose the deltoid fascia. The raphae between the anterior and middle thirds was identified and this plane developed to provide access into the subacromial space. Abundant bursal tissues were debrided sharply using Metzenbaum scissors. The rotator cuff tear was visualized and carefully inspected. There was a small area of rotator cuff thinning identified by palpation near the anterior and mid insertional fibers of the supraspinatus tendon, consistent with the arthroscopic findings. Given the small amount of footprint compromised on the articular side, it is felt best to apply a [employer] Regeneten patch over this area to stimulate healing rather than to take down the tear. Therefore, a medium sized patch was selected and secured using the appropriate bone and soft tissue staples.  The bicipital groove was identified by palpation and opened for 1-1.5 cm. The biceps tendon stump was retrieved through this defect. The floor of the bicipital groove was roughened with a curet before a Biomet 2.9 mm JuggerKnot anchor was inserted. Both sets of sutures were passed through the biceps tendon and tied securely to effect the tenodesis. The bicipital sheath was reapproximated using two #0 Ethibond interrupted sutures, incorporating the biceps tendon to further reinforce the tenodesis.  The wound was copiously irrigated with sterile saline solution before the deltoid raphae was reapproximated using 2-0 Vicryl interrupted sutures. The subcutaneous tissues were closed in two layers using 2-0 Vicryl interrupted sutures before the skin was closed using staples. The portal sites also were closed using staples. A sterile bulky dressing was applied to the shoulder before the arm was placed into a shoulder immobilizer. The patient was then awakened, extubated, and returned to the recovery room in  satisfactory condition after tolerating the procedure well.

## 2021-05-21 NOTE — Discharge Instructions (Addendum)
Orthopedic discharge instructions: Keep dressing dry and intact.  May shower after dressing changed on post-op day #4 (Saturday).  Cover staples with Band-Aids after drying off. Apply ice frequently to shoulder. Take Mobic 15 mg daily OR ibuprofen 600-800 mg TID with meals for 7-10 days, then as necessary. Take oxycodone as prescribed when needed.  May supplement with ES Tylenol if necessary. Keep shoulder immobilizer on at all times except may remove for bathing purposes. Follow-up in 10-14 days or as scheduled.  AMBULATORY SURGERY  DISCHARGE INSTRUCTIONS   The drugs that you were given will stay in your system until tomorrow so for the next 24 hours you should not:  Drive an automobile Make any legal decisions Drink any alcoholic beverage   You may resume regular meals tomorrow.  Today it is better to start with liquids and gradually work up to solid foods.  You may eat anything you prefer, but it is better to start with liquids, then soup and crackers, and gradually work up to solid foods.   Please notify your doctor immediately if you have any unusual bleeding, trouble breathing, redness and pain at the surgery site, drainage, fever, or pain not relieved by medication.    Additional Instructions:     Please contact your physician with any problems or Same Day Surgery at [phone], Monday through Friday 6 am to 4 pm, or [place] at [facility] number at [phone].              Interscalene Nerve Block with Exparel   For your surgery you have received an Interscalene Nerve Block with Exparel. Nerve Blocks affect many types of nerves, including nerves that control movement, pain and normal sensation.  You may experience feelings such as numbness, tingling, heaviness, weakness or the inability to move your arm or the feeling or sensation that your arm has "fallen asleep". A nerve block with Exparel can last up to 5 days.  Usually the weakness wears off first.   The tingling and heaviness usually wear off next.  Finally you may start to notice pain.  Keep in mind that this may occur in any order.  Once a nerve block starts to wear off it is usually completely gone within 60 minutes. ISNB may cause mild shortness of breath, a hoarse voice, blurry vision, unequal pupils, or drooping of the face on the same side as the nerve block.  These symptoms will usually resolve with the numbness.  Very rarely the procedure itself can cause mild seizures. If needed, your surgeon will give you a prescription for pain medication.  It will take about 60 minutes for the oral pain medication to become fully effective.  So, it is recommended that you start taking this medication before the nerve block first begins to wear off, or when you first begin to feel discomfort. Take your pain medication only as prescribed.  Pain medication can cause sedation and decrease your breathing if you take more than you need for the level of pain that you have. Nausea is a common side effect of many pain medications.  You may want to eat something before taking your pain medicine to prevent nausea. After an Interscalene nerve block, you cannot feel pain, pressure or extremes in temperature in the effected arm.  Because your arm is numb it is at an increased risk for injury.  To decrease the possibility of injury, please practice the following:  While you are awake change the position of your arm frequently  to prevent too much pressure on any one area for prolonged periods of time.  If you have a cast or tight dressing, check the color or your fingers every couple of hours.  Call your surgeon with the appearance of any discoloration (white or blue). If you are given a sling to wear before you go home, please wear it  at all times until the block has completely worn off.  Do not get up at night without your sling. Please contact [facility] or your surgeon if you do not begin to regain sensation  after 7 days from the surgery.  Anesthesia may be contacted by calling the Same Day Surgery Department, Mon. through Fri., 6 am to 4 pm at [phone].   If you experience any other problems or concerns, please contact your surgeon's office. If you experience severe or prolonged shortness of breath go to the nearest emergency department.   SHOULDER SLING IMMOBILIZER   VIDEO Slingshot 2 Shoulder Brace Application - YouTube ---[email or web address]/  INSTRUCTIONS While supporting the injured arm, slide the forearm into the sling. Wrap the adjustable shoulder strap around the neck and shoulders and attach the strap end to the sling using  the "alligator strap tab."  Adjust the shoulder strap to the required length. Position the shoulder pad behind the neck. To secure the shoulder pad location (optional), pull the shoulder strap away from the shoulder pad, unfold the hook material on the top of the pad, then press the shoulder strap back onto the hook material to secure the pad in place. Attach the closure strap across the open top of the sling. Position the strap so that it holds the arm securely in the sling. Next, attach the thumb strap to the open end of the sling between the thumb and fingers. After sling has been fit, it may be easily removed and reapplied using the quick release buckle on shoulder strap. If a neutral pillow or 15 abduction pillow is included, place the pillow at the waistline. Attach the sling to the pillow, lining up hook material on the pillow with the loop on sling. Adjust the waist strap to fit.  If waist strap is too long, cut it to fit. Use the small piece of double sided hook material (located on top of the pillow) to secure the strap end. Place the double sided hook material on the inside of the cut strap end and secure it to the waist strap.     If no pillow is included, attach the waist strap to the sling and adjust to fit.    Washing  Instructions: Straps and sling must be removed and cleaned regularly depending on your activity level and perspiration. Hand wash straps and sling in cold water with mild detergent, rinse, air dry

## 2021-05-22 ENCOUNTER — Encounter: Payer: Self-pay | Admitting: Surgery

## 2021-05-22 DIAGNOSIS — M7522 Bicipital tendinitis, left shoulder: Secondary | ICD-10-CM | POA: Insufficient documentation

## 2021-05-22 DIAGNOSIS — M7582 Other shoulder lesions, left shoulder: Secondary | ICD-10-CM | POA: Insufficient documentation

## 2021-05-22 DIAGNOSIS — M75112 Incomplete rotator cuff tear or rupture of left shoulder, not specified as traumatic: Secondary | ICD-10-CM | POA: Insufficient documentation

## 2021-05-22 NOTE — Anesthesia Postprocedure Evaluation (Addendum)
Anesthesia Post Note  Patient: Sara Andrews  Procedure(s) Performed: SHOULDER ARTHROSCOPY WITH DEBRIDEMENT, DECOMPRESSION, REPAIR OF PARTIAL THICKNESS ROTATOR CUFF REPAIR, BICEPS TENODESIS (Left: Shoulder)  Patient location during evaluation: PACU Anesthesia Type: General Level of consciousness: awake and alert and oriented Pain management: pain level controlled Vital Signs Assessment: post-procedure vital signs reviewed and stable Respiratory status: spontaneous breathing, nonlabored ventilation and respiratory function stable Cardiovascular status: blood pressure returned to baseline and stable Postop Assessment: no signs of nausea or vomiting Anesthetic complications: no   No notable events documented.   Last Vitals:  Vitals:   05/21/21 1530 05/21/21 1537  BP: 104/66 (!) 88/54  Pulse: 88 (!) 101  Resp: 14 18  Temp:  (!) 36.1 C  SpO2: 95% 99%    Last Pain:  Vitals:   05/21/21 1537  TempSrc: Temporal  PainSc: 0-No pain                 Conchetta Lamia

## 2021-05-22 NOTE — Addendum Note (Signed)
Addendum  created 05/22/21 0756 by Eliese Kerwood, MD   Clinical Note Signed    

## 2021-05-27 DIAGNOSIS — Z9889 Other specified postprocedural states: Secondary | ICD-10-CM | POA: Diagnosis not present

## 2021-06-04 ENCOUNTER — Other Ambulatory Visit: Payer: Self-pay | Admitting: Family Medicine

## 2021-06-04 DIAGNOSIS — J449 Chronic obstructive pulmonary disease, unspecified: Secondary | ICD-10-CM

## 2021-06-04 DIAGNOSIS — F5105 Insomnia due to other mental disorder: Secondary | ICD-10-CM

## 2021-06-11 DIAGNOSIS — Z9889 Other specified postprocedural states: Secondary | ICD-10-CM | POA: Diagnosis not present

## 2021-06-18 DIAGNOSIS — Z9889 Other specified postprocedural states: Secondary | ICD-10-CM | POA: Diagnosis not present

## 2021-07-01 DIAGNOSIS — Z9889 Other specified postprocedural states: Secondary | ICD-10-CM | POA: Diagnosis not present

## 2021-07-02 DIAGNOSIS — M810 Age-related osteoporosis without current pathological fracture: Secondary | ICD-10-CM | POA: Diagnosis not present

## 2021-07-10 DIAGNOSIS — Z9889 Other specified postprocedural states: Secondary | ICD-10-CM | POA: Diagnosis not present

## 2021-07-16 ENCOUNTER — Other Ambulatory Visit: Payer: Self-pay

## 2021-07-16 ENCOUNTER — Ambulatory Visit (INDEPENDENT_AMBULATORY_CARE_PROVIDER_SITE_OTHER): Payer: Medicare Other

## 2021-07-16 VITALS — BP 108/72 | HR 105 | Temp 98.2°F | Resp 15 | Ht <= 58 in | Wt 133.3 lb

## 2021-07-16 DIAGNOSIS — Z1211 Encounter for screening for malignant neoplasm of colon: Secondary | ICD-10-CM

## 2021-07-16 DIAGNOSIS — Z0000 Encounter for general adult medical examination without abnormal findings: Secondary | ICD-10-CM

## 2021-07-16 DIAGNOSIS — Z23 Encounter for immunization: Secondary | ICD-10-CM | POA: Diagnosis not present

## 2021-07-16 NOTE — Patient Instructions (Signed)
[name] , Thank you for taking time to come for your Medicare Wellness Visit. I appreciate your ongoing commitment to your health goals. Please review the following plan we discussed and let me know if I can assist you in the future.   Screening recommendations/referrals: Colonoscopy: Cologuard done 07/22/18. Ordered today Mammogram: done 10/12/20 Bone Density: done 08/11/19 Recommended yearly ophthalmology/optometry visit for glaucoma screening and checkup Recommended yearly dental visit for hygiene and checkup  Vaccinations: Influenza vaccine: done today Pneumococcal vaccine: done 07/12/19 Tdap vaccine: done 04/28/17 Shingles vaccine: done 03/16/19; due for second dose   Covid-19:done 09/24/19 & 10/22/19  Advanced directives: Advance directive discussed with you today. I have provided a copy for you to complete at home and have notarized. Once this is complete please bring a copy in to our office so we can scan it into your chart.   Conditions/risks identified: Recommend increasing physical activity as tolerated  Next appointment: Follow up in one year for your annual wellness visit    Preventive Care 65 Years and Older, Female Preventive care refers to lifestyle choices and visits with your health care provider that can promote health and wellness. What does preventive care include? A yearly physical exam. This is also called an annual well check. Dental exams once or twice a year. Routine eye exams. Ask your health care provider how often you should have your eyes checked. Personal lifestyle choices, including: Daily care of your teeth and gums. Regular physical activity. Eating a healthy diet. Avoiding tobacco and drug use. Limiting alcohol use. Practicing safe sex. Taking low-dose aspirin every day. Taking vitamin and mineral supplements as recommended by your health care provider. What happens during an annual well check? The services and screenings done by your health  care provider during your annual well check will depend on your age, overall health, lifestyle risk factors, and family history of disease. Counseling  Your health care provider may ask you questions about your: Alcohol use. Tobacco use. Drug use. Emotional well-being. Home and relationship well-being. Sexual activity. Eating habits. History of falls. Memory and ability to understand (cognition). Work and work Astronomer. Reproductive health. Screening  You may have the following tests or measurements: Height, weight, and BMI. Blood pressure. Lipid and cholesterol levels. These may be checked every 5 years, or more frequently if you are over 35 years old. Skin check. Lung cancer screening. You may have this screening every year starting at age 43 if you have a 30-pack-year history of smoking and currently smoke or have quit within the past 15 years. Fecal occult blood test (FOBT) of the stool. You may have this test every year starting at age 62. Flexible sigmoidoscopy or colonoscopy. You may have a sigmoidoscopy every 5 years or a colonoscopy every 10 years starting at age 3. Hepatitis C blood test. Hepatitis B blood test. Sexually transmitted disease (STD) testing. Diabetes screening. This is done by checking your blood sugar (glucose) after you have not eaten for a while (fasting). You may have this done every 1-3 years. Bone density scan. This is done to screen for osteoporosis. You may have this done starting at age 40. Mammogram. This may be done every 1-2 years. Talk to your health care provider about how often you should have regular mammograms. Talk with your health care provider about your test results, treatment options, and if necessary, the need for more tests. Vaccines  Your health care provider may recommend certain vaccines, such as: Influenza vaccine. This is recommended every  year. Tetanus, diphtheria, and acellular pertussis (Tdap, Td) vaccine. You may need a Td  booster every 10 years. Zoster vaccine. You may need this after age 25. Pneumococcal 13-valent conjugate (PCV13) vaccine. One dose is recommended after age 102. Pneumococcal polysaccharide (PPSV23) vaccine. One dose is recommended after age 67. Talk to your health care provider about which screenings and vaccines you need and how often you need them. This information is not intended to replace advice given to you by your health care provider. Make sure you discuss any questions you have with your health care provider. Document Released: 08/10/2015 Document Revised: 04/02/2016 Document Reviewed: 05/15/2015 Elsevier Interactive Patient Education  2017 [place] Prevention in the Home Falls can cause injuries. They can happen to people of all ages. There are many things you can do to make your home safe and to help prevent falls. What can I do on the outside of my home? Regularly fix the edges of walkways and driveways and fix any cracks. Remove anything that might make you trip as you walk through a door, such as a raised step or threshold. Trim any bushes or trees on the path to your home. Use bright outdoor lighting. Clear any walking paths of anything that might make someone trip, such as rocks or tools. Regularly check to see if handrails are loose or broken. Make sure that both sides of any steps have handrails. Any raised decks and porches should have guardrails on the edges. Have any leaves, snow, or ice cleared regularly. Use sand or salt on walking paths during winter. Clean up any spills in your garage right away. This includes oil or grease spills. What can I do in the bathroom? Use night lights. Install grab bars by the toilet and in the tub and shower. Do not use towel bars as grab bars. Use non-skid mats or decals in the tub or shower. If you need to sit down in the shower, use a plastic, non-slip stool. Keep the floor dry. Clean up any water that spills on the floor  as soon as it happens. Remove soap buildup in the tub or shower regularly. Attach bath mats securely with double-sided non-slip rug tape. Do not have throw rugs and other things on the floor that can make you trip. What can I do in the bedroom? Use night lights. Make sure that you have a light by your bed that is easy to reach. Do not use any sheets or blankets that are too big for your bed. They should not hang down onto the floor. Have a firm chair that has side arms. You can use this for support while you get dressed. Do not have throw rugs and other things on the floor that can make you trip. What can I do in the kitchen? Clean up any spills right away. Avoid walking on wet floors. Keep items that you use a lot in easy-to-reach places. If you need to reach something above you, use a strong step stool that has a grab bar. Keep electrical cords out of the way. Do not use floor polish or wax that makes floors slippery. If you must use wax, use non-skid floor wax. Do not have throw rugs and other things on the floor that can make you trip. What can I do with my stairs? Do not leave any items on the stairs. Make sure that there are handrails on both sides of the stairs and use them. Fix handrails that are broken or  loose. Make sure that handrails are as long as the stairways. Check any carpeting to make sure that it is firmly attached to the stairs. Fix any carpet that is loose or worn. Avoid having throw rugs at the top or bottom of the stairs. If you do have throw rugs, attach them to the floor with carpet tape. Make sure that you have a light switch at the top of the stairs and the bottom of the stairs. If you do not have them, ask someone to add them for you. What else can I do to help prevent falls? Wear shoes that: Do not have high heels. Have rubber bottoms. Are comfortable and fit you well. Are closed at the toe. Do not wear sandals. If you use a stepladder: Make sure that it is  fully opened. Do not climb a closed stepladder. Make sure that both sides of the stepladder are locked into place. Ask someone to hold it for you, if possible. Clearly mark and make sure that you can see: Any grab bars or handrails. First and last steps. Where the edge of each step is. Use tools that help you move around (mobility aids) if they are needed. These include: Canes. Walkers. Scooters. Crutches. Turn on the lights when you go into a dark area. Replace any light bulbs as soon as they burn out. Set up your furniture so you have a clear path. Avoid moving your furniture around. If any of your floors are uneven, fix them. If there are any pets around you, be aware of where they are. Review your medicines with your doctor. Some medicines can make you feel dizzy. This can increase your chance of falling. Ask your doctor what other things that you can do to help prevent falls. This information is not intended to replace advice given to you by your health care provider. Make sure you discuss any questions you have with your health care provider. Document Released: 05/10/2009 Document Revised: 12/20/2015 Document Reviewed: 08/18/2014 Elsevier Interactive Patient Education  2017 [employer].

## 2021-07-16 NOTE — Progress Notes (Signed)
Subjective:   Sara Andrews is a 65 y.o. female who presents for Medicare Annual (Subsequent) preventive examination.  Review of Systems     Cardiac Risk Factors include: advanced age (>40men, >36 women);dyslipidemia     Objective:    Today's Vitals   07/16/21 0857 07/16/21 0858  BP: 108/72   Pulse: (!) 105   Resp: 15   Temp: 98.2 F (36.8 C)   TempSrc: Oral   SpO2: 96%   Weight: 133 lb 4.8 oz (60.5 kg)   Height:  (1.448 m)   PainSc:  4    Body mass index is 28.85 kg/m.  Advanced Directives 07/16/2021 05/21/2021 05/20/2021 07/12/2020 03/20/2020 04/28/2017 01/26/2017  Does Patient Have a Medical Advance Directive? No No No No No No No  Would patient like information on creating a medical advance directive? Yes (MAU/Ambulatory/Procedural Areas - Information given) No - Patient declined No - Patient declined Yes (MAU/Ambulatory/Procedural Areas - Information given) - - -  Some encounter information is confidential and restricted. Go to Review Flowsheets activity to see all data.    Current Medications (verified) Outpatient Encounter Medications as of 07/16/2021  Medication Sig   albuterol (VENTOLIN HFA) 108 (90 Base) MCG/ACT inhaler INHALE 2 PUFFS INTO THE LUNGS EVERY 6 HOURS AS NEEDED FOR WHEEZING OR SHORTNESS OF BREATH   buPROPion (WELLBUTRIN XL) 150 MG 24 hr tablet Take 1 tablet (150 mg total) by mouth daily.   Cholecalciferol (VITAMIN D PO) Take 1,000 Units by mouth daily. 1000 IU   DULoxetine (CYMBALTA) 60 MG capsule Take 1 capsule (60 mg total) by mouth daily.   fluticasone (FLONASE) 50 MCG/ACT nasal spray SHAKE LIQUID AND USE 2 SPRAYS IN EACH NOSTRIL DAILY   ipratropium-albuterol (DUONEB) 0.5-2.5 (3) MG/3ML SOLN Take 3 mLs by nebulization 3 (three) times daily as needed. For asthma/copd exacerbation symptoms (instead of albuterol neb)   levothyroxine (SYNTHROID) 75 MCG tablet TAKE 1 TABLET(75 MCG) BY MOUTH DAILY   lidocaine (XYLOCAINE) 5 % ointment Apply 1  application topically daily as needed (itching).   loratadine (CLARITIN) 10 MG tablet TAKE 1 TABLET(10 MG) BY MOUTH AT BEDTIME   Melatonin 10 MG TABS Take 10 mg by mouth at bedtime.   meloxicam (MOBIC) 15 MG tablet Take 15 mg by mouth daily.   montelukast (SINGULAIR) 10 MG tablet TAKE 1 TABLET(10 MG) BY MOUTH AT BEDTIME   omeprazole (PRILOSEC) 40 MG capsule TAKE ONE CAPSULE BY MOUTH DAILY   ondansetron (ZOFRAN-ODT) 4 MG disintegrating tablet Take by mouth.   Propylene Glycol (SYSTANE COMPLETE) 0.6 % SOLN Place 1 drop into both eyes daily as needed (dry eyes).   rosuvastatin (CRESTOR) 20 MG tablet Take 1 tablet (20 mg total) by mouth daily.   Semaglutide,0.25 or 0.5MG /DOS, (OZEMPIC, 0.25 OR 0.5 MG/DOSE,) 2 MG/1.5ML SOPN Inject 0.25-0.5 mg into the skin once a week. (Patient taking differently: Inject 0.5 mg into the skin every Monday.)   SUMAtriptan (IMITREX) 50 MG tablet May repeat in 2 hours if headache persists or recurs. Do not take more than 2 in 24 hours.   traZODone (DESYREL) 100 MG tablet Take 1 tablet (100 mg total) by mouth at bedtime.   TRELEGY ELLIPTA 100-62.5-25 MCG/INH AEPB INHALE 1 PUFF INTO THE LUNGS DAILY   triamcinolone cream (KENALOG) 0.1 % Apply 1 application topically 2 (two) times daily. (Patient taking differently: Apply 1 application topically 2 (two) times daily as needed (insect bites).)   valACYclovir (VALTREX) 500 MG tablet TAKE 1 TABLET(500 MG) BY  MOUTH DAILY   [DISCONTINUED] albuterol (PROVENTIL) (2.5 MG/3ML) 0.083% nebulizer solution Take 2.5 mg by nebulization every 6 (six) hours as needed for wheezing or shortness of breath.   [DISCONTINUED] Camphor-Menthol-Methyl Sal (SALONPAS) 3.08-02-08 % PTCH Apply 1 patch topically daily as needed (pain).   [DISCONTINUED] Misc. Devices (PULSE OXIMETER) MISC 1 each by Does not apply route 4 (four) times daily.   [DISCONTINUED] oxyCODONE (ROXICODONE) 5 MG immediate release tablet Take 1-2 tablets (5-10 mg total) by mouth every 4  (four) hours as needed for moderate pain or severe pain.   No facility-administered encounter medications on file as of 07/16/2021.    Allergies (verified) Patient has no known allergies.   History: Past Medical History:  Diagnosis Date   Anxiety    Arthritis    "hips, hands, back" (03/10/2016)   Asthma    Chronic bronchitis (HCC)    COPD (chronic obstructive pulmonary disease) (HCC)    Cough    Depression    Esophagitis, reflux    GERD (gastroesophageal reflux disease)    Hyperlipidemia    Hypothyroidism    IBS (irritable bowel syndrome)    Lumbago    Muscle pain    Osteoarthritis    Sinus disorder    Thyroid disease    Type 2 diabetes, diet controlled (HCC)    Uncomplicated herpes simplex    Past Surgical History:  Procedure Laterality Date   ABDOMINAL HYSTERECTOMY     CARPAL TUNNEL RELEASE Right    LUMBAR LAMINECTOMY  03/16/2018   L4-5 Laminectomy & Fusion w/ Pedicle Screws, TLIF & Allograft; Surgeon: Virl Diamond, MD; Location: [facility] MAIN OR; Service: Orthopedics; Laterality: N/A; Prone, ProAxis, Stim Neuromonitoring, O-Arm, Cell Saver, [employer], [employer], Justin, PACS on [facility]     OOPHORECTOMY Bilateral    ORIF TIBIA PLATEAU Left 03/10/2016   Procedure: OPEN REDUCTION INTERNAL FIXATION (ORIF) BICONDYLAR TIBIAL PLATEAU FRACTURE;  Surgeon: Eldred Manges, MD;  Location: [facility] OR;  Service: Orthopedics;  Laterality: Left;   SHOULDER ARTHROSCOPY W/ ROTATOR CUFF REPAIR Right    SHOULDER ARTHROSCOPY WITH SUBACROMIAL DECOMPRESSION, ROTATOR CUFF REPAIR AND BICEP TENDON REPAIR Left 05/21/2021   Procedure: SHOULDER ARTHROSCOPY WITH DEBRIDEMENT, DECOMPRESSION, REPAIR OF PARTIAL THICKNESS ROTATOR CUFF REPAIR, BICEPS TENODESIS;  Surgeon: Christena Flake, MD;  Location: [facility] ORS;  Service: Orthopedics;  Laterality: Left;   TONSILLECTOMY AND ADENOIDECTOMY     Family History  Problem Relation Age of Onset   Hypertension Daughter    Breast cancer Daughter    Diabetes Father     Heart disease Father    Breast cancer Maternal Aunt 20   Breast cancer Paternal Aunt 59   Depression Sister    Alcohol abuse Sister    Anxiety disorder Sister    Bipolar disorder Sister    Pneumonia Sister    Skin cancer Daughter    Anxiety disorder Daughter    Social History   Socioeconomic History   Marital status: Widowed    Spouse name: Not on file   Number of children: 3   Years of education: Not on file   Highest education level: 10th grade  Occupational History   Occupation: disabled    Comment: retired  Tobacco Use   Smoking status: Former    Packs/day: 2.00    Years: 30.00    Pack years: 60.00    Types: Cigarettes    Quit date: 09/03/2008    Years since quitting: 12.8   Smokeless tobacco: Never  Vaping Use  Vaping Use: Never used  Substance and Sexual Activity   Alcohol use: No   Drug use: No   Sexual activity: Never  Other Topics Concern   Not on file  Social History Narrative   She used to work at  [employer], but had a left knee work related injury 03/10/2016 , require left knee surgery and is now having back pain, that is getting evaluated, Out of work since.       Patient recently found out that one of her twins [name]) was diagnosed with breast cancer); will be having a double mastectomy soon.      Pt lives alone   Social Determinants of Health   Financial Resource Strain: Low Risk    Difficulty of Paying Living Expenses: Not hard at all  Food Insecurity: No Food Insecurity   Worried About Programme researcher, broadcasting/film/video in the Last Year: Never true   Barista in the Last Year: Never true  Transportation Needs: No Transportation Needs   Lack of Transportation (Medical): No   Lack of Transportation (Non-Medical): No  Physical Activity: Inactive   Days of Exercise per Week: 0 days   Minutes of Exercise per Session: 0 min  Stress: Stress Concern Present   Feeling of Stress : To some extent  Social Connections: Moderately Integrated   Frequency  of Communication with Friends and Family: More than three times a week   Frequency of Social Gatherings with Friends and Family: More than three times a week   Attends Religious Services: More than 4 times per year   Active Member of [employer] or Organizations: Yes   Attends Banker Meetings: More than 4 times per year   Marital Status: Widowed    Tobacco Counseling Counseling given: Not Answered   Clinical Intake:  Pre-visit preparation completed: Yes  Pain : 0-10 Pain Score: 4  Pain Type: Chronic pain Pain Location: Arm Pain Orientation: Left Pain Descriptors / Indicators: Aching, Sore Pain Onset: More than a month ago Pain Frequency: Intermittent     BMI - recorded: 28.85 Nutritional Status: BMI 25 -29 Overweight Nutritional Risks: None Diabetes: Yes CBG done?: No Did pt. bring in CBG monitor from home?: No  Nutrition Risk Assessment:  Has the patient had any N/V/D within the last 2 months?  No  Does the patient have any non-healing wounds?  No  Has the patient had any unintentional weight loss or weight gain?  No   Diabetes:  Is the patient diabetic?  Yes  If diabetic, was a CBG obtained today?  No  Did the patient bring in their glucometer from home?  No  How often do you monitor your CBG's? Pt does not actively check blood sugar.   Financial Strains and Diabetes Management:  Are you having any financial strains with the device, your supplies or your medication? No .  Does the patient want to be seen by Chronic Care Management for management of their diabetes?  No  Would the patient like to be referred to a Nutritionist or for Diabetic Management?  No   Diabetic Exams:  Diabetic Eye Exam: Completed 05/07/21.  Diabetic Foot Exam: Completed 09/17/20.   How often do you need to have someone help you when you read instructions, pamphlets, or other written materials from your doctor or pharmacy?: 1 - Never    Interpreter Needed?:  No  Information entered by :: Reather Littler LPN   Activities of Daily Living In your present state  of health, do you have any difficulty performing the following activities: 07/16/2021 05/20/2021  Hearing? N -  Vision? N -  Difficulty concentrating or making decisions? N -  Walking or climbing stairs? N Y  Comment - knee and back pain  Dressing or bathing? N -  Doing errands, shopping? N N  Preparing Food and eating ? N -  Using the Toilet? N -  In the past six months, have you accidently leaked urine? N -  Do you have problems with loss of bowel control? N -  Managing your Medications? N -  Managing your Finances? N -  Housekeeping or managing your Housekeeping? N -  Some recent data might be hidden    Patient Care Team: Alba Cory, MD as PCP - General (Family Medicine) Sherlon Handing, MD as Consulting Physician (Endocrinology) Poggi, Excell Seltzer, MD as Consulting Physician (Orthopedic Surgery) Anson [place], PA-C as Physician Assistant (Physician Assistant)  Indicate any recent Medical Services you may have received from other than [facility] providers in the past year (date may be approximate).     Assessment:   This is a routine wellness examination for Catherine.  Hearing/Vision screen Hearing Screening - Comments:: Pt denies hearing difficulty Vision Screening - Comments:: Annual vision screen[facility] at [facility]  Dietary issues and exercise activities discussed: Current Exercise Habits: The patient does not participate in regular exercise at present, Exercise limited by: orthopedic condition(s)   Goals Addressed   None    Depression Screen PHQ 2/9 Scores 07/16/2021 03/13/2021 02/14/2021 12/11/2020 10/31/2020 09/17/2020 07/12/2020  PHQ - 2 Score 0 0 0 0 0 2 0  PHQ- 9 Score 3 - 0 - 0 6 -  Exception Documentation - - - - - - -  Not completed - - - - - - -    Fall Risk Fall Risk  07/16/2021 03/13/2021 02/14/2021 12/11/2020 10/31/2020  Falls in the past  year? 0 0 0 0 0  Number falls in past yr: 0 0 0 0 0  Injury with Fall? 0 0 0 0 0  Comment - - - - -  Risk for fall due to : No Fall Risks No Fall Risks - - -  Follow up Falls prevention discussed Falls prevention discussed - - -    FALL RISK PREVENTION PERTAINING TO THE HOME:  Any stairs in or around the home? Yes  If so, are there any without handrails? No  Home free of loose throw rugs in walkways, pet beds, electrical cords, etc? Yes  Adequate lighting in your home to reduce risk of falls? Yes   ASSISTIVE DEVICES UTILIZED TO PREVENT FALLS:  Life alert? No  Use of a cane, walker or w/c? No  Grab bars in the bathroom? Yes  Shower chair or bench in shower? Yes  Elevated toilet seat or a handicapped toilet? No   TIMED UP AND GO:  Was the test performed? Yes .  Length of time to ambulate 10 feet: 5 sec.   Gait steady and fast without use of assistive device  Cognitive Function: Normal cognitive status assessed by direct observation by this Nurse Health Advisor. No abnormalities found.          Immunizations Immunization History  Administered Date(s) Administered   Fluad Quad(high Dose 65+) 05/14/2020, 07/16/2021   Influenza Inj Mdck Quad Pf 05/07/2019   Influenza,inj,Quad PF,6+ Mos 04/28/2017, 04/12/2018   Influenza-Unspecified 05/24/2015, 06/02/2016   PFIZER(Purple Top)SARS-COV-2 Vaccination 09/24/2019, 10/22/2019   Pneumococcal Conjugate-13  04/28/2017   Pneumococcal Polysaccharide-23 06/26/2015, 07/12/2019   Tdap 04/28/2017   Zoster Recombinat (Shingrix) 03/16/2019    TDAP status: Up to date  Flu Vaccine status: Completed at today's visit  Pneumococcal vaccine status: Up to date  Covid-19 vaccine status: Completed vaccines  Qualifies for Shingles Vaccine? Yes   Zostavax completed No   Shingrix Completed?: Yes due for second dose  Screening Tests Health Maintenance  Topic Date Due   Zoster Vaccines- Shingrix (2 of 2) 05/11/2019   COVID-19 Vaccine (3 -  Pfizer risk series) 11/19/2019   Fecal DNA (Cologuard)  07/22/2021   HEMOGLOBIN A1C  09/13/2021   FOOT EXAM  09/17/2021   URINE MICROALBUMIN  09/17/2021   OPHTHALMOLOGY EXAM  05/07/2022   MAMMOGRAM  10/13/2022   TETANUS/TDAP  04/29/2027   Pneumonia Vaccine 45+ Years old  Completed   INFLUENZA VACCINE  Completed   DEXA SCAN  Completed   Hepatitis C Screening  Completed   HPV VACCINES  Aged Out    Health Maintenance  Health Maintenance Due  Topic Date Due   Zoster Vaccines- Shingrix (2 of 2) 05/11/2019   COVID-19 Vaccine (3 - Pfizer risk series) 11/19/2019    Colorectal cancer screening: Type of screening: Cologuard. Completed 07/22/18. Repeat every 3 years. Ordered today  Mammogram status: Completed 10/12/20. Repeat every year  Bone Density status: Completed 08/11/19. Results reflect: Bone density results: OSTEOPOROSIS. Repeat every 2 years. Ordered by endocrinology  Lung Cancer Screening: (Low Dose CT Chest recommended if Age 109-80 years, 30 pack-year currently smoking OR have quit w/in 15years.) does qualify. Completed 11/16/20  Additional Screening:  Hepatitis C Screening: does qualify; Completed 01/21/17  Vision Screening: Recommended annual ophthalmology exams for early detection of glaucoma and other disorders of the eye. Is the patient up to date with their annual eye exam?  Yes  Who is the provider or what is the name of the office in which the patient attends annual eye exams? [facility].   Dental Screening: Recommended annual dental exams for proper oral hygiene  Community Resource Referral / Chronic Care Management: CRR required this visit?  No   CCM required this visit?  No      Plan:     I have personally reviewed and noted the following in the patients chart:   Medical and social history Use of alcohol, tobacco or illicit drugs  Current medications and supplements including opioid prescriptions.  Functional ability and status Nutritional  status Physical activity Advanced directives List of other physicians Hospitalizations, surgeries, and ER visits in previous 12 months Vitals Screenings to include cognitive, depression, and falls Referrals and appointments  In addition, I have reviewed and discussed with patient certain preventive protocols, quality metrics, and best practice recommendations. A written personalized care plan for preventive services as well as general preventive health recommendations were provided to patient.     Reather Littler, LPN   90/24/0973   Nurse Notes: pt c/o cough that is sometimes dry and sometimes productive. Pt is not taking any OTC products for cough. Pt advised to contact office if sxs worsen or persist.

## 2021-07-26 ENCOUNTER — Other Ambulatory Visit: Payer: Self-pay | Admitting: Family Medicine

## 2021-07-26 DIAGNOSIS — F325 Major depressive disorder, single episode, in full remission: Secondary | ICD-10-CM

## 2021-07-26 NOTE — Telephone Encounter (Signed)
Requested Prescriptions  Pending Prescriptions Disp Refills   DULoxetine (CYMBALTA) 60 MG capsule [Pharmacy Med Name: DULOXETINE DR 60MG  CAPSULES] 90 capsule 0    Sig: TAKE 1 CAPSULE(60 MG) BY MOUTH DAILY     Psychiatry: Antidepressants - SNRI Passed - 07/26/2021  5:37 PM      Passed - Completed PHQ-2 or PHQ-9 in the last 360 days      Passed - Last BP in normal range    BP Readings from Last 1 Encounters:  07/16/21 108/72         Passed - Valid encounter within last 6 months    Recent Outpatient Visits          4 months ago Dyslipidemia associated with type 2 diabetes mellitus [facility])   [facility], MD   5 months ago COVID-19   [facility] [place], [place], MD   7 months ago Dyslipidemia associated with type 2 diabetes mellitus [facility])   [facility], MD   8 months ago Asthma-COPD overlap syndrome [facility])   [facility], PA-C   10 months ago Dyslipidemia associated with type 2 diabetes mellitus [facility])   [facility], MD      Future Appointments            In 1 month Alba Cory, Carlynn Purl, MD [facility], [facility]   In 11 months  [facility], [facility]

## 2021-08-01 ENCOUNTER — Ambulatory Visit: Payer: Self-pay | Admitting: *Deleted

## 2021-08-01 NOTE — Telephone Encounter (Signed)
Summary: cough / rx request   The patient has experienced a cough for roughly a month   The patient was previously advised to take Mucinex but feels little to no improvement   The patient would like to discuss further when possible        Chief Complaint: requesting appt and medication for cough  Symptoms: productive cough clear sputum, soreness in sides  Frequency: x 1 month Pertinent Negatives: Patient denies chest pain, difficulty breathing, fever, other cold sx . Disposition: [] ED /[x] Urgent Care (no appt availability in office) / [] Appointment(In office/virtual)/ [x]  [facility]/ [] Home Care/ [] Refused Recommended Disposition /[] [facility]/ []  Follow-up with PCP Additional Notes:  No available OV appt until 09/03/21. Hx COPD. Recommended UC and patient declined requesting to see any provider .      Reason for Disposition  Cough has been present for > 3 weeks  Answer Assessment - Initial Assessment Questions 1. ONSET: "When did the cough begin?"      X 1 month ago  2. SEVERITY: "How bad is the cough today?"      Worse at night  3. SPUTUM: "Describe the color of your sputum" (none, dry cough; clear, white, yellow, green)     Clear  4. HEMOPTYSIS: "Are you coughing up any blood?" If so ask: "How much?" (flecks, streaks, tablespoons, etc.)    Last week red streaks  5. DIFFICULTY BREATHING: "Are you having difficulty breathing?" If Yes, ask: "How bad is it?" (e.g., mild, moderate, severe)    - MILD: No SOB at rest, mild SOB with walking, speaks normally in sentences, can lie down, no retractions, pulse < 100.    - MODERATE: SOB at rest, SOB with minimal exertion and prefers to sit, cannot lie down flat, speaks in phrases, mild retractions, audible wheezing, pulse 100-120.    - SEVERE: Very SOB at rest, speaks in single words, struggling to breathe, sitting hunched forward, retractions, pulse > 120      no 6. FEVER: "Do you have a fever?" If Yes, ask: "What  is your temperature, how was it measured, and when did it start?"     Na  7. CARDIAC HISTORY: "Do you have any history of heart disease?" (e.g., heart attack, congestive heart failure)      na 8. LUNG HISTORY: "Do you have any history of lung disease?"  (e.g., pulmonary embolus, asthma, emphysema)     Hx asthma and COPD  9. PE RISK FACTORS: "Do you have a history of blood clots?" (or: recent major surgery, recent prolonged travel, bedridden)     na 10. OTHER SYMPTOMS: "Do you have any other symptoms?" (e.g., runny nose, wheezing, chest pain)       Cough productive clear mucus  11. PREGNANCY: "Is there any chance you are pregnant?" "When was your last menstrual period?"       na 12. TRAVEL: "Have you traveled out of the country in the last month?" (e.g., travel history, exposures)       na  Protocols used: Cough - Acute Productive-A-AH

## 2021-08-02 NOTE — Telephone Encounter (Signed)
Tried to contact pt several times to inform her that our office has no openings for today, call kept dropping

## 2021-08-17 LAB — COLOGUARD: COLOGUARD: NEGATIVE

## 2021-08-29 ENCOUNTER — Other Ambulatory Visit: Payer: Self-pay | Admitting: Family Medicine

## 2021-08-29 DIAGNOSIS — E038 Other specified hypothyroidism: Secondary | ICD-10-CM

## 2021-08-29 DIAGNOSIS — J449 Chronic obstructive pulmonary disease, unspecified: Secondary | ICD-10-CM

## 2021-08-30 NOTE — Telephone Encounter (Signed)
Pt states she has enough until her appt ?

## 2021-09-10 NOTE — Progress Notes (Signed)
Name: Sara Andrews   MRN: [id number]    DOB: July 19, 1956   Date:09/11/2021       Progress Note  Subjective  Chief Complaint  Follow Up  HPI  Recurrent headaches: doing well on prn imitrex and zofran, she states not as frequent, she described pain as throbbing, photophobia, eye pain during episodes. Episodes once a month and stable, Takes medication prn    DDD and spondylolisthesis: doing well since surgery done 02/2018. She is off pain  medication, taking prn tylenol only and since pain is under control, she used to see Dr. Patrice Paradise. Stable   Leg cramps: only at night, she has to stand up and walk around and it improves, she is taking magnesium. Discussed RLS and gave her Requip but caused pain to get worse and has been off medication Symptoms improved   COPD/asthma/Emphysema  : she is back on Trelegy, she rinses her mouth after she uses it. She states cough is worse at night,No wheezing or SOB since started on Trelegy.    Recurrent scalp pruritis: resolves since started taking Valtrex every night . Unchanged    GERD/dyspesia: she states she is doing well , she has been off Ozempic, not taking medications.   Bilateral shoulder pian: she had rotator cuff repair right shoulder many years ago and was doing well, had left rotator cuff repair October 22, she had PT and after Reclast injection 01/05 and a couple of weeks later she developed right shoulder and arm pan and also increase in pain of left shoulder. She has been taking ibuprofen, seen by Ortho and was advised to contact Endo, she called Endo and was advised to follow up with me.    Hypothyroidism: She is currently taking 75 mcg daily and half on Mondays, last TSH was at goal .She has lost a few more pounds but states by choice. No change in bowel movements or dysphagia.    DM II : diagnosed at work back in 2012 she used to take medications  She denies polyphagia, polyuria , she has polydipsia.  Started on Trulicity June [id number], but  she was having worsening of constipation and indigestion, so we switched to Ozempic 04/2017 weight was 198 lbs and went  down to 132.4  lbs. She was of Ozempic since January 21 and weight went up to 160 lbs, we stopped medication due to bloating and nausea, however she asked to go back on medication and has been able to tolerated lower dose of Ozempic at 0.25 mg she stopped medication around 09/22 and has been able to keep weight down without medication. We will recheck A1C today.    Major Depression:  no longer seeing Dr. Shea Evans but taking medication as prescribed - Duloxetind and Wellbutrin  , she has been feeling good however not sleeping well on Trazodone, we will change to Seroquel qhs    Osteoporosis: she had first Reclast injection January 2023 and two weeks later developed bilateral shoulder pain   Chest pain: intermittently and states not as often now,  she is under the care of  cardiologist. Dr. Garen Lah and had EKG and echo that showed normal EF . CT lung had already shown some coronary calcification. She is compliant with statin therapy  Doing well   Senile purpura: reassurance given. Both arms and legs . Unchanged    Atherosclerosis Aorta: found on CT done 03/20/2020, on statin therapy , no side effects of medication , last LDL was at goal   Patient Active Problem  List   Diagnosis Date Noted   Tendinitis of upper biceps tendon of left shoulder 05/22/2021   Rotator cuff tendinitis, left 05/22/2021   Nontraumatic incomplete tear of left rotator cuff 05/22/2021   History of shingles 03/23/2020   Burning sensation of lower extremity 03/22/2020   Centrilobular emphysema ([place]) 01/12/2020   Senile purpura ([place]) 01/12/2020   Major depression in remission ([place]) 01/12/2020   Dyslipidemia associated with type 2 diabetes mellitus ([place]) 01/12/2020   Bursitis of hip 08/23/2019   Carpal tunnel syndrome 08/23/2019   Thoracic aorta atherosclerosis ([place]) 08/03/2019   Spondylosis of lumbar  spine 11/06/2016   Spinal stenosis of lumbar region with neurogenic claudication 10/28/2016   Chronic bilateral low back pain with bilateral sciatica 09/16/2016   Hyperglycemia 08/07/2016   Moderate episode of recurrent major depressive disorder ([place]) 08/07/2016   Pain of left heel 06/02/2016   Degenerative cervical disc 03/04/2016   Gastroesophageal reflux disease without esophagitis 02/01/2016   Primary osteoarthritis of right hand 02/01/2016   Paresthesias in left hand 02/01/2016   SOB (shortness of breath) 10/05/2013   Asthma-COPD overlap syndrome ([place]) 10/05/2013   History of smoking 10/05/2013   Hyperlipidemia 10/05/2013   Adult onset hypothyroidism 10/05/2013    Past Surgical History:  Procedure Laterality Date   ABDOMINAL HYSTERECTOMY     CARPAL TUNNEL RELEASE Right    LUMBAR LAMINECTOMY  03/16/2018   L4-5 Laminectomy & Fusion w/ Pedicle Screws, TLIF & Allograft; Surgeon: Alfredia Client, MD; Location: [facility] MAIN OR; Service: Orthopedics; Laterality: N/A; Prone, ProAxis, Stim Neuromonitoring, O-Arm, Cell Saver, [place], [employer], Justin, PACS on [facility]     OOPHORECTOMY Bilateral    ORIF TIBIA PLATEAU Left 03/10/2016   Procedure: OPEN REDUCTION INTERNAL FIXATION (ORIF) BICONDYLAR TIBIAL PLATEAU FRACTURE;  Surgeon: Marybelle Killings, MD;  Location: [place];  Service: Orthopedics;  Laterality: Left;   SHOULDER ARTHROSCOPY W/ ROTATOR CUFF REPAIR Right    SHOULDER ARTHROSCOPY WITH SUBACROMIAL DECOMPRESSION, ROTATOR CUFF REPAIR AND BICEP TENDON REPAIR Left 05/21/2021   Procedure: SHOULDER ARTHROSCOPY WITH DEBRIDEMENT, DECOMPRESSION, REPAIR OF PARTIAL THICKNESS ROTATOR CUFF REPAIR, BICEPS TENODESIS;  Surgeon: Corky Mull, MD;  Location: [facility];  Service: Orthopedics;  Laterality: Left;   TONSILLECTOMY AND ADENOIDECTOMY      Family History  Problem Relation Age of Onset   Hypertension Daughter    Breast cancer Daughter    Diabetes Father    Heart disease Father    Breast cancer  Maternal Aunt 40   Breast cancer Paternal Aunt 49   Depression Sister    Alcohol abuse Sister    Anxiety disorder Sister    Bipolar disorder Sister    Pneumonia Sister    Skin cancer Daughter    Anxiety disorder Daughter     Social History   Tobacco Use   Smoking status: Former    Packs/day: 2.00    Years: 30.00    Pack years: 60.00    Types: Cigarettes    Quit date: 09/03/2008    Years since quitting: 13.0   Smokeless tobacco: Never  Substance Use Topics   Alcohol use: No     Current Outpatient Medications:    albuterol (VENTOLIN HFA) 108 (90 Base) MCG/ACT inhaler, INHALE 2 PUFFS INTO THE LUNGS EVERY 6 HOURS AS NEEDED FOR WHEEZING OR SHORTNESS OF BREATH, Disp: 6.7 g, Rfl: 0   buPROPion (WELLBUTRIN XL) 150 MG 24 hr tablet, Take 1 tablet (150 mg total) by mouth daily., Disp: 90 tablet, Rfl: 1  Cholecalciferol (VITAMIN D PO), Take 1,000 Units by mouth daily. 1000 IU, Disp: , Rfl:    DULoxetine (CYMBALTA) 60 MG capsule, TAKE 1 CAPSULE(60 MG) BY MOUTH DAILY, Disp: 90 capsule, Rfl: 0   fluticasone (FLONASE) 50 MCG/ACT nasal spray, SHAKE LIQUID AND USE 2 SPRAYS IN EACH NOSTRIL DAILY, Disp: 48 g, Rfl: 0   ipratropium-albuterol (DUONEB) 0.5-2.5 (3) MG/3ML SOLN, Take 3 mLs by nebulization 3 (three) times daily as needed. For asthma/copd exacerbation symptoms (instead of albuterol neb), Disp: 180 mL, Rfl: 1   levothyroxine (SYNTHROID) 75 MCG tablet, TAKE 1 TABLET(75 MCG) BY MOUTH DAILY, Disp: 90 tablet, Rfl: 3   lidocaine (XYLOCAINE) 5 % ointment, Apply 1 application topically daily as needed (itching)., Disp: , Rfl:    loratadine (CLARITIN) 10 MG tablet, TAKE 1 TABLET(10 MG) BY MOUTH AT BEDTIME, Disp: 90 tablet, Rfl: 1   Melatonin 10 MG TABS, Take 10 mg by mouth at bedtime., Disp: , Rfl:    meloxicam (MOBIC) 15 MG tablet, Take 15 mg by mouth daily., Disp: , Rfl:    montelukast (SINGULAIR) 10 MG tablet, TAKE 1 TABLET(10 MG) BY MOUTH AT BEDTIME, Disp: 90 tablet, Rfl: 1   omeprazole  (PRILOSEC) 40 MG capsule, TAKE ONE CAPSULE BY MOUTH DAILY, Disp: 90 capsule, Rfl: 1   ondansetron (ZOFRAN-ODT) 4 MG disintegrating tablet, Take by mouth., Disp: , Rfl:    Propylene Glycol (SYSTANE COMPLETE) 0.6 % SOLN, Place 1 drop into both eyes daily as needed (dry eyes)., Disp: , Rfl:    rosuvastatin (CRESTOR) 20 MG tablet, Take 1 tablet (20 mg total) by mouth daily., Disp: 90 tablet, Rfl: 1   Semaglutide,0.25 or 0.5MG /DOS, (OZEMPIC, 0.25 OR 0.5 MG/DOSE,) 2 MG/1.5ML SOPN, Inject 0.25-0.5 mg into the skin once a week. (Patient taking differently: Inject 0.5 mg into the skin every Monday.), Disp: 4.5 mL, Rfl: 1   SUMAtriptan (IMITREX) 50 MG tablet, May repeat in 2 hours if headache persists or recurs. Do not take more than 2 in 24 hours., Disp: 10 tablet, Rfl: 0   traZODone (DESYREL) 100 MG tablet, Take 1 tablet (100 mg total) by mouth at bedtime., Disp: 90 tablet, Rfl: 0   TRELEGY ELLIPTA 100-62.5-25 MCG/INH AEPB, INHALE 1 PUFF INTO THE LUNGS DAILY, Disp: 180 each, Rfl: 0   triamcinolone cream (KENALOG) 0.1 %, Apply 1 application topically 2 (two) times daily. (Patient taking differently: Apply 1 application topically 2 (two) times daily as needed (insect bites).), Disp: 30 g, Rfl: 0   valACYclovir (VALTREX) 500 MG tablet, TAKE 1 TABLET(500 MG) BY MOUTH DAILY, Disp: 90 tablet, Rfl: 0  No Known Allergies  I personally reviewed active problem list, medication list, allergies, family history, social history, health maintenance with the patient/caregiver today.   ROS  Constitutional: Negative for fever , positive for mild weight change.  Respiratory: positive  for cough but no shortness of breath.   Cardiovascular: Negative for chest pain or palpitations.  Gastrointestinal: Negative for abdominal pain, no bowel changes.  Musculoskeletal: Negative for gait problem or joint swelling.  Skin: Negative for rash.  Neurological: Negative for dizziness or headache.  No other specific complaints in a  complete review of systems (except as listed in HPI above).   Objective  Vitals:   09/11/21 1334  BP: 114/70  Pulse: 90  Resp: 16  SpO2: 97%  Weight: 132 lb (59.9 kg)  Height: 4\' 11"  (1.499 m)    Body mass index is 26.66 kg/m.  Physical Exam  Constitutional: Patient appears  well-developed and well-nourished.  No distress.  HEENT: head atraumatic, normocephalic, pupils equal and reactive to light, neck supple Cardiovascular: Normal rate, regular rhythm and normal heart sounds.  No murmur heard. No BLE edema. Pulmonary/Chest: Effort normal and breath sounds normal. No respiratory distress. Abdominal: Soft.  There is no tenderness. Psychiatric: Patient has a normal mood and affect. behavior is normal. Judgment and thought content normal.   Recent Results (from the past 2160 hour(s))  Cologuard     Status: None   Collection Time: 08/10/21  4:30 PM  Result Value Ref Range   COLOGUARD Negative Negative    Comment:  NEGATIVE TEST RESULT. A negative Cologuard result indicates a low likelihood that a colorectal cancer (CRC) or advanced adenoma (adenomatous polyps with more advanced pre-malignant features)  is present. The chance that a person with a negative Cologuard test has a colorectal cancer is less than 1 in 1500 (negative predictive value >99.9%) or has an  advanced adenoma is less than  5.3% (negative predictive value 94.7%). These data are based on a prospective cross-sectional study of 10,000 individuals at average risk for colorectal cancer who were screened with both Cologuard and colonoscopy. (Imperiale T. et al, N Engl J Med 2014;370(14):1286-1297) The normal value (reference range) for this assay is negative.  COLOGUARD RE-SCREENING RECOMMENDATION: Periodic colorectal cancer screening is an important part of preventive healthcare for asymptomatic individuals at average risk for colorectal cancer.  Following a negative Cologuard result, the American Cancer Society and U.S.   Multi-Society Task Force screening guidelines recommend a Cologuard re-screening interval of 3 years.  References: American Cancer Society Guideline for Colorectal Cancer Screening: https://www.cancer.org/cancer/colon-rectal-cancer/detection-diagnosis-staging/acs-recommendations.html.; Rex DK, Boland CR, Dominitz JK, Colorectal Cancer Screening: Recommendations for Physicians and Patients from the U.S. Multi-Society Task Force on Colorectal Cancer Screening , Am J Gastroenterology 2017; 112:1016-1030.  TEST DESCRIPTION: Composite algorithmic analysis of stool DNA-biomarkers with hemoglobin immunoassay.   Quantitative values of individual biomarkers are not reportable and are not associated with individual biomarker result reference ranges. Cologuard is intended for colorectal cancer screening of adults of either sex, 45 years or older, who are at average-risk for colorectal cancer (CRC). Cologuard has been approved for use by the U.S. FDA. The performance of Cologuard was  established in a cross sectional study of average-risk adults aged 7-84. Cologuard performance in patients ages 12 to 74 years was estimated by sub-group analysis of near-age groups. Colonoscopies performed for a positive result may find as the most clinically significant lesion: colorectal cancer [4.0%], advanced adenoma (including sessile serrated polyps greater than or equal to 1cm diameter) [20%] or non- advanced adenoma [31%]; or no colorectal neoplasia [45%]. These estimates are derived from a prospective cross-sectional screening study of 10,000 individuals at average risk for colorectal cancer who were screened with both Cologuard and colonoscopy. (Imperiale T. et al, [place] Mis J Med 2014;370(14):1286-1297.) Cologuard may produce a false negative or false positive result (no colorectal cancer or precancerous polyp present at colonoscopy follow up). A negative Cologuard test result does not guarantee the absence of CRC or advanced adenoma  (pre-cancer). The current Cologuard  screening interval is every 3 years. Science writer and [place] Therapist, music). Cologuard performance data in a 10,000 patient pivotal study using colonoscopy as the reference method can be accessed at the following location: www.exactlabs.com/results. Additional description of the Cologuard test process, warnings and precautions can be found at www.cologuard.com.      PHQ2/9: Depression screen [facility] 2/9 09/11/2021 07/16/2021 03/13/2021 02/14/2021 12/11/2020  Decreased Interest 0 0 0 0 0  Down, Depressed, Hopeless 0 0 0 0 0  PHQ - 2 Score 0 0 0 0 0  Altered sleeping 0 3 - 0 -  Tired, decreased energy 0 0 - 0 -  Change in appetite 0 0 - 0 -  Feeling bad or failure about yourself  0 0 - 0 -  Trouble concentrating 0 0 - 0 -  Moving slowly or fidgety/restless 0 0 - 0 -  Suicidal thoughts 0 0 - 0 -  PHQ-9 Score 0 3 - 0 -  Difficult doing work/chores - Not difficult at all - - -  Some recent data might be hidden    phq 9 is negative   Fall Risk: Fall Risk  09/11/2021 07/16/2021 03/13/2021 02/14/2021 12/11/2020  Falls in the past year? 0 0 0 0 0  Number falls in past yr: 0 0 0 0 0  Injury with Fall? 0 0 0 0 0  Comment - - - - -  Risk for fall due to : No Fall Risks No Fall Risks No Fall Risks - -  Follow up Falls prevention discussed Falls prevention discussed Falls prevention discussed - -      Functional Status Survey: Is the patient deaf or have difficulty hearing?: No Does the patient have difficulty seeing, even when wearing glasses/contacts?: No Does the patient have difficulty concentrating, remembering, or making decisions?: No Does the patient have difficulty walking or climbing stairs?: No Does the patient have difficulty dressing or bathing?: No Does the patient have difficulty doing errands alone such as visiting a doctor's office or shopping?: No    Assessment & Plan  1. Dyslipidemia associated with type 2 diabetes  mellitus (HCC)  - HgB A1c - Microalbumin / creatinine urine ratio - COMPLETE METABOLIC PANEL WITH GFR - Lipid panel  2. Centrilobular emphysema ([place])  - Fluticasone-Umeclidin-Vilant (TRELEGY ELLIPTA) 100-62.5-25 MCG/ACT AEPB; Inhale 1 puff into the lungs daily.  Dispense: 3 each; Refill: 1 - albuterol (VENTOLIN HFA) 108 (90 Base) MCG/ACT inhaler; Inhale 2 puffs into the lungs every 6 (six) hours as needed for wheezing or shortness of breath.  Dispense: 6.7 g; Refill: 0  3. Thoracic aorta atherosclerosis (HCC)  rosuvastatin (CRESTOR) 20 MG tablet; Take 1 tablet (20 mg total) by mouth daily.  Dispense: 90 tablet; Refill: 1  4. Senile purpura ([place])   5. Asthma-COPD overlap syndrome (HCC)  - montelukast (SINGULAIR) 10 MG tablet; TAKE 1 TABLET(10 MG) BY MOUTH AT BEDTIME  Dispense: 90 tablet; Refill: 1  6. Major depression in remission (HCC)  - DULoxetine (CYMBALTA) 60 MG capsule; TAKE 1 CAPSULE(60 MG) BY MOUTH DAILY  Dispense: 90 capsule; Refill: 1 - buPROPion (WELLBUTRIN XL) 150 MG 24 hr tablet; Take 1 tablet (150 mg total) by mouth daily.  Dispense: 90 tablet; Refill: 1 - QUEtiapine (SEROQUEL) 25 MG tablet; Take 1 tablet (25 mg total) by mouth at bedtime.  Dispense: 90 tablet; Refill: 0  7. Migraine without aura and without status migrainosus, not intractable  - SUMAtriptan (IMITREX) 50 MG tablet; May repeat in 2 hours if headache persists or recurs. Do not take more than 2 in 24 hours.  Dispense: 10 tablet; Refill: 0  8. Gastroesophageal reflux disease without esophagitis   9. RLS (restless legs syndrome)   10. Age-related osteoporosis without current pathological fracture   11. Need for shingles vaccine  - Zoster Vaccine Adjuvanted [facility]) injection; Inject 0.5 mLs into the muscle once for 1 dose.  Dispense: 0.5 mL; Refill: 0  12. Insomnia due to mental disorder  -QUEtiapine (SEROQUEL) 25 MG tablet; Take 1 tablet (25 mg total) by mouth at bedtime.  Dispense: 90  tablet; Refill: 0  13. Dyslipidemia  - rosuvastatin (CRESTOR) 20 MG tablet; Take 1 tablet (20 mg total) by mouth daily.  Dispense: 90 tablet; Refill: 1  14. Other specified hypothyroidism  - levothyroxine (SYNTHROID) 75 MCG tablet; TAKE 1 TABLET(75 MCG) BY MOUTH DAILY  Dispense: 90 tablet; Refill: 3  15. Acute pain of both shoulders  - C-reactive protein - Sedimentation rate

## 2021-09-11 ENCOUNTER — Encounter: Payer: Self-pay | Admitting: Family Medicine

## 2021-09-11 ENCOUNTER — Other Ambulatory Visit: Payer: Self-pay

## 2021-09-11 ENCOUNTER — Ambulatory Visit (INDEPENDENT_AMBULATORY_CARE_PROVIDER_SITE_OTHER): Payer: Medicare Other | Admitting: Family Medicine

## 2021-09-11 ENCOUNTER — Other Ambulatory Visit: Payer: Self-pay | Admitting: Family Medicine

## 2021-09-11 VITALS — BP 114/70 | HR 90 | Resp 16 | Ht 59.0 in | Wt 132.0 lb

## 2021-09-11 DIAGNOSIS — D692 Other nonthrombocytopenic purpura: Secondary | ICD-10-CM | POA: Diagnosis not present

## 2021-09-11 DIAGNOSIS — J432 Centrilobular emphysema: Secondary | ICD-10-CM

## 2021-09-11 DIAGNOSIS — E038 Other specified hypothyroidism: Secondary | ICD-10-CM

## 2021-09-11 DIAGNOSIS — E1169 Type 2 diabetes mellitus with other specified complication: Secondary | ICD-10-CM | POA: Diagnosis not present

## 2021-09-11 DIAGNOSIS — I700 Atherosclerosis of aorta: Secondary | ICD-10-CM | POA: Diagnosis not present

## 2021-09-11 DIAGNOSIS — F325 Major depressive disorder, single episode, in full remission: Secondary | ICD-10-CM

## 2021-09-11 DIAGNOSIS — G43009 Migraine without aura, not intractable, without status migrainosus: Secondary | ICD-10-CM

## 2021-09-11 DIAGNOSIS — M810 Age-related osteoporosis without current pathological fracture: Secondary | ICD-10-CM

## 2021-09-11 DIAGNOSIS — G2581 Restless legs syndrome: Secondary | ICD-10-CM

## 2021-09-11 DIAGNOSIS — M25512 Pain in left shoulder: Secondary | ICD-10-CM

## 2021-09-11 DIAGNOSIS — M25511 Pain in right shoulder: Secondary | ICD-10-CM

## 2021-09-11 DIAGNOSIS — Z23 Encounter for immunization: Secondary | ICD-10-CM

## 2021-09-11 DIAGNOSIS — K219 Gastro-esophageal reflux disease without esophagitis: Secondary | ICD-10-CM

## 2021-09-11 DIAGNOSIS — J449 Chronic obstructive pulmonary disease, unspecified: Secondary | ICD-10-CM

## 2021-09-11 DIAGNOSIS — F5105 Insomnia due to other mental disorder: Secondary | ICD-10-CM

## 2021-09-11 DIAGNOSIS — E785 Hyperlipidemia, unspecified: Secondary | ICD-10-CM

## 2021-09-11 MED ORDER — LEVOTHYROXINE SODIUM 75 MCG PO TABS: ORAL_TABLET | ORAL | 3 refills | Status: DC

## 2021-09-11 MED ORDER — ALBUTEROL SULFATE HFA 108 (90 BASE) MCG/ACT IN AERS: 2.0000 | INHALATION_SPRAY | Freq: Four times a day (QID) | RESPIRATORY_TRACT | 0 refills | Status: DC | PRN

## 2021-09-11 MED ORDER — SHINGRIX 50 MCG/0.5ML IM SUSR: 0.5000 mL | Freq: Once | INTRAMUSCULAR | 0 refills | Status: AC

## 2021-09-11 MED ORDER — SUMATRIPTAN SUCCINATE 50 MG PO TABS: ORAL_TABLET | ORAL | 0 refills | Status: DC

## 2021-09-11 MED ORDER — VALACYCLOVIR HCL 500 MG PO TABS: ORAL_TABLET | ORAL | 1 refills | Status: DC

## 2021-09-11 MED ORDER — BUPROPION HCL ER (XL) 150 MG PO TB24: 150.0000 mg | ORAL_TABLET | Freq: Every day | ORAL | 1 refills | Status: DC

## 2021-09-11 MED ORDER — ROSUVASTATIN CALCIUM 20 MG PO TABS: 20.0000 mg | ORAL_TABLET | Freq: Every day | ORAL | 1 refills | Status: DC

## 2021-09-11 MED ORDER — SHINGRIX 50 MCG/0.5ML IM SUSR: 0.5000 mL | Freq: Once | INTRAMUSCULAR | 1 refills | Status: DC

## 2021-09-11 MED ORDER — TRELEGY ELLIPTA 100-62.5-25 MCG/ACT IN AEPB: 1.0000 | INHALATION_SPRAY | Freq: Every day | RESPIRATORY_TRACT | 1 refills | Status: DC

## 2021-09-11 MED ORDER — QUETIAPINE FUMARATE 25 MG PO TABS: 25.0000 mg | ORAL_TABLET | Freq: Every day | ORAL | 0 refills | Status: DC

## 2021-09-11 MED ORDER — DULOXETINE HCL 60 MG PO CPEP: ORAL_CAPSULE | ORAL | 1 refills | Status: DC

## 2021-09-11 MED ORDER — MONTELUKAST SODIUM 10 MG PO TABS: ORAL_TABLET | ORAL | 1 refills | Status: DC

## 2021-09-11 NOTE — Patient Instructions (Signed)
Polymyalgia Rheumatica Polymyalgia rheumatica (PMR) is an inflammatory disorder that causes the muscles and joints to ache and become stiff. Sometimes, PMR leads to a more dangerous condition that can cause vision loss (temporal arteritis or giant cell arteritis). What are the causes? The exact cause of PMR is not known. What increases the risk? You are more likely to develop this condition if you are: Female. 68 years old or older. Of [place] descent. What are the signs or symptoms? Pain and stiffness are the main symptoms of PMR and affect both sides of the body. These symptoms: May be worse after inactivity and in the morning. May affect your: Hips, buttocks, and thighs. Neck, arms, and shoulders. This can make it hard to raise your arms above your head. Hands and wrists. Other symptoms include: Fever. Tiredness. Weakness. Depression. Decreased appetite. This may lead to weight loss. Symptoms could start slowly but often come on suddenly, sometimes even overnight, or over a few days. How is this diagnosed? This condition is diagnosed with your medical history and a physical exam. You may need to see a health care provider who specializes in diseases of the joints, muscles, and bones (rheumatologist). You may also have tests, including: Blood tests. Often, test results that show inflammation are very elevated. Imaging studies like X-rays or ultrasound, which may be done to rule out other conditions. These are often usual in PMR. How is this treated? PMR usually goes away without treatment, but it may take years. PMR often responds rapidly (within a few days) to low-dose glucocorticoids (steroids). These medicines have serious side effects. Once your symptoms are better controlled, the dose should be lowered to find the lowest possible dose that controls your symptoms. Regular exercise and rest will also help your symptoms. Follow these instructions at home:  Take  over-the-counter and prescription medicines only as told by your health care provider. Make sure to get enough rest and sleep. Eat a healthy and nutritious diet that includes calcium and vitamin D. Calcium is important for bone health, and vitamin D helps your body absorb calcium. Good sources of calcium and vitamin D include: Certain fatty fish, such as salmon and tuna. Products that have calcium and vitamin D added to them (are fortified), such as fortified cereals. Collard greens, turnip greens, broccoli, and kale. Egg yolks. Cheese. Liver. Try to exercise most days of the week. Ask your health care provider what type of exercises are best for you. Keep all follow-up visits. This is important. Contact a health care provider if: Your symptoms do not improve with medicine. You have side effects from steroids. These may include: Weight gain. Swelling. Insomnia. Mood changes. Bruising. High blood sugar readings, if you have diabetes. Higher than normal blood pressure readings, if you monitor your blood pressure. Get help right away if: You develop symptoms of temporal arteritis, such as: A change in vision. Severe headache. Scalp pain. Jaw pain. These symptoms may represent a serious problem that is an emergency. Do not wait to see if the symptoms will go away. Get medical help right away. Call your local emergency services (911 in the [place] Do not drive yourself to the hospital. Summary Polymyalgia rheumatica is an inflammatory disorder that causes aching and stiffness in your muscles and joints. The exact cause of this condition is not known. This condition usually goes away without treatment. Your health care provider may give you low-dose glucocorticoids (steroids) to help manage your pain and stiffness. Rest and regular exercise will help   the symptoms. This information is not intended to replace advice given to you by your health care provider. Make sure you discuss any  questions you have with your health care provider. Document Revised: 11/15/2020 Document Reviewed: 11/15/2020 Elsevier Patient Education  2022 Elsevier Inc.  

## 2021-09-12 LAB — LIPID PANEL
Cholesterol: 147 mg/dL (ref <200)
HDL: 79 mg/dL (ref >=50)
LDL Cholesterol (Calc): 54 mg/dL (calc)
Non-HDL Cholesterol (Calc): 68 mg/dL (calc) (ref <130)
Total CHOL/HDL Ratio: 1.9 (calc) (ref <5.0)
Triglycerides: 65 mg/dL (ref <150)

## 2021-09-12 LAB — MICROALBUMIN / CREATININE URINE RATIO
Creatinine, Urine: 41 mg/dL (ref 20–275)
Microalb, Ur: <0.2 mg/dL

## 2021-09-12 LAB — COMPLETE METABOLIC PANEL WITH GFR
AG Ratio: 2 (calc) (ref 1.0–2.5)
ALT: 17 U/L (ref 6–29)
AST: 17 U/L (ref 10–35)
Albumin: 4.4 g/dL (ref 3.6–5.1)
Alkaline phosphatase (APISO): 70 U/L (ref 37–153)
BUN: 20 mg/dL (ref 7–25)
CO2: 30 mmol/L (ref 20–32)
Calcium: 10.4 mg/dL (ref 8.6–10.4)
Chloride: 103 mmol/L (ref 98–110)
Creat: 1.04 mg/dL (ref 0.50–1.05)
Globulin: 2.2 g/dL (calc) (ref 1.9–3.7)
Glucose, Bld: 89 mg/dL (ref 65–99)
Potassium: 4.7 mmol/L (ref 3.5–5.3)
Sodium: 140 mmol/L (ref 135–146)
Total Bilirubin: 0.7 mg/dL (ref 0.2–1.2)
Total Protein: 6.6 g/dL (ref 6.1–8.1)
eGFR: 59 mL/min/{1.73_m2} — ABNORMAL LOW (ref >=60)

## 2021-09-12 LAB — HEMOGLOBIN A1C
Hgb A1c MFr Bld: 5.3 % of total Hgb (ref <5.7)
Mean Plasma Glucose: 105 mg/dL
eAG (mmol/L): 5.8 mmol/L

## 2021-09-12 LAB — C-REACTIVE PROTEIN: CRP: 0.5 mg/L (ref <8.0)

## 2021-09-12 LAB — SEDIMENTATION RATE: Sed Rate: 2 mm/h (ref 0–30)

## 2021-09-30 ENCOUNTER — Other Ambulatory Visit: Payer: Self-pay | Admitting: Family Medicine

## 2021-09-30 DIAGNOSIS — F5105 Insomnia due to other mental disorder: Secondary | ICD-10-CM

## 2021-10-01 ENCOUNTER — Other Ambulatory Visit: Payer: Self-pay | Admitting: Family Medicine

## 2021-10-01 DIAGNOSIS — J432 Centrilobular emphysema: Secondary | ICD-10-CM

## 2021-10-02 ENCOUNTER — Other Ambulatory Visit: Payer: Self-pay | Admitting: Family Medicine

## 2021-10-02 DIAGNOSIS — J432 Centrilobular emphysema: Secondary | ICD-10-CM

## 2021-10-08 ENCOUNTER — Telehealth: Payer: Self-pay

## 2021-10-08 ENCOUNTER — Ambulatory Visit: Payer: Self-pay | Admitting: *Deleted

## 2021-10-08 NOTE — Telephone Encounter (Signed)
Spoke with patient and she stated she believes she is having acid indigestion related pains/symptoms and has an office visit tomorrow to be evaluated. I advised her to seek emergency care with any new, worsening, or palpitations or any feelings that she is experiencing a cardiac-related event. She verbalized understanding and agreement to do so. Dr. Ancil Boozer aware.  ?

## 2021-10-08 NOTE — Telephone Encounter (Signed)
?  Chief Complaint: chest tightness, sour taste in mouth requesting appt./ medication ?Symptoms: chest tightness under breast both sides, sour taste in mouth, vomiting x 2 early am. Feels some better now but Rolaids, OTC medications not working . Old Rx for omeprazole not working. Patient checked BP 105/72 HR 101.  ?Frequency: started yesterday  ?Pertinent Negatives: Patient denies chest pain, difficulty breathing, no fever, no dizziness, no sweating, no chest pressure, no weakness. ?Disposition: [] ED /[] Urgent Care (no appt availability in office) / [x] Appointment(In office/virtual)/ []  [place] [facility]/ [] Home Care/ [] Refused Recommended Disposition /[] [place] [facility]/ []  Follow-up with PCP ?Additional Notes:  ? ?My chart VV requested and scheduled for 10/09/21. Recommended patient to recheck BP and if lower than 100/60 call back or go to ED. Patient able to drink water without difficulty or worsening sx. Please advise if appt available today . ? ?Reason for Disposition ? [1] Patient says chest pain feels exactly the same as previously diagnosed "heartburn" AND [2] describes burning in chest AND [3] accompanying sour taste in mouth ? ?Answer Assessment - Initial Assessment Questions ?1. LOCATION: "Where does it hurt?"   ?    Under breast area both sides  ?2. RADIATION: "Does the pain go anywhere else?" (e.g., into neck, jaw, arms, back) ?    No  ?3. ONSET: "When did the chest pain begin?" (Minutes, hours or days)  ?    Yesterday  ?4. PATTERN "Does the pain come and go, or has it been constant since it started?"  "Does it get worse with exertion?"  ?    Constant now  ?5. DURATION: "How long does it last" (e.g., seconds, minutes, hours) ?    Still feels tightness in chest lasting since last night  ?6. SEVERITY: "How bad is the pain?"  (e.g., Scale 1-10; mild, moderate, or severe) ?   - MILD (1-3): doesn't interfere with normal activities  ?   - MODERATE (4-7): interferes with normal activities or  awakens from sleep ?   - SEVERE (8-10): excruciating pain, unable to do any normal activities   ?    Moderate  ?7. CARDIAC RISK FACTORS: "Do you have any history of heart problems or risk factors for heart disease?" (e.g., angina, prior heart attack; diabetes, high blood pressure, high cholesterol, smoker, or strong family history of heart disease) ?    Na  ?8. PULMONARY RISK FACTORS: "Do you have any history of lung disease?"  (e.g., blood clots in lung, asthma, emphysema, birth control pills) ?    Na  ?9. CAUSE: "What do you think is causing the chest pain?" ?    Not sure possible acid reflux  ?10. OTHER SYMPTOMS: "Do you have any other symptoms?" (e.g., dizziness, nausea, vomiting, sweating, fever, difficulty breathing, cough) ?      Vomited x 2 during the night , BP 105/72 HR 101 ?11. PREGNANCY: "Is there any chance you are pregnant?" "When was your last menstrual period?" ?      na ? ?Protocols used: Chest Pain-A-AH ? ?

## 2021-10-09 ENCOUNTER — Telehealth (INDEPENDENT_AMBULATORY_CARE_PROVIDER_SITE_OTHER): Payer: Medicare Other | Admitting: Internal Medicine

## 2021-10-09 DIAGNOSIS — R112 Nausea with vomiting, unspecified: Secondary | ICD-10-CM | POA: Diagnosis not present

## 2021-10-09 DIAGNOSIS — R051 Acute cough: Secondary | ICD-10-CM | POA: Diagnosis not present

## 2021-10-09 DIAGNOSIS — J069 Acute upper respiratory infection, unspecified: Secondary | ICD-10-CM

## 2021-10-09 LAB — POC INFLUENZA A&B (BINAX/QUICKVUE)
Influenza A, POC: NEGATIVE
Influenza B, POC: NEGATIVE

## 2021-10-09 MED ORDER — BENZONATATE 100 MG PO CAPS: 100.0000 mg | ORAL_CAPSULE | Freq: Two times a day (BID) | ORAL | 0 refills | Status: DC | PRN

## 2021-10-09 NOTE — Progress Notes (Signed)
Virtual Visit via Video Note ? ?I connected with Sara Andrews on 10/09/21 at 11:40 AM EDT by a video enabled telemedicine application and verified that I am speaking with the correct person using two identifiers. ? ?Location: ?Patient: Home ?Provider: [facility] ?  ?I discussed the limitations of evaluation and management by telemedicine and the availability of in person appointments. The patient expressed understanding and agreed to proceed. ? ?History of Present Illness: ? ?Sara Andrews is a 66 year old female presenting vie telemedicine for multiple symptoms. Started on 2 nights ago where she felt indigestion and acid reflux type pain.  She had been on omeprazole in the past, however this was discontinued as she did not feel like she was getting any benefit from this medication.  She did take an omeprazole with her acid reflux symptoms, however she states that its did not help.  She then began vomiting throughout the night.  She took a Rolaids and eventually her nausea and vomiting resided.  She denies abdominal pain currently.  Last night, however she began to have URI-like symptoms.  Her main symptom is nasal congestion and dry cough.  She endorses mild shortness of breath and is using her albuterol inhaler for this, which controls symptoms.  She denies wheezing, chest pain, fevers.  She does have some sore throat but no sinus pain/pressure or ear pain/pressure.  She denies any sick contacts.  She has been immunized against COVID-19.  She takes Claritin for seasonal allergies and Flonase as needed.  She has not tried this for her current symptoms yet. ?  ?Observations/Objective: ? ?General: well appearing, no acute distress ?ENT: conjunctiva normal appearing bilaterally  ?Neuro: Answers all questions appropriately ? ? ?Assessment and Plan: ? ?1. Upper respiratory tract infection, unspecified type/Nausea and vomiting, unspecified vomiting type/Acute cough: Concern for COVID with sudden symptoms of  nausea/vomiting and URI symptoms.  She will come to the office later today to be tested for COVID and flu.  I do believe symptoms are consistent with viral illness, manage conservatively with rest and staying well-hydrated.  She can use Flonase for sinus congestion, cough suppressant will be sent to pharmacy as well.  Nausea vomiting and indigestion symptoms have all resolved at this point. ? ?- Novel Coronavirus, NAA ([facility]) ?- benzonatate (TESSALON) 100 MG capsule; Take 1 capsule (100 mg total) by mouth 2 (two) times daily as needed for cough.  Dispense: 20 capsule; Refill: 0 ?- POC Influenza A&B (Binax test) ? ? ?Follow Up Instructions: PRN ? ?  ?I discussed the assessment and treatment plan with the patient. The patient was provided an opportunity to ask questions and all were answered. The patient agreed with the plan and demonstrated an understanding of the instructions. ?  ?The patient was advised to call back or seek an in-person evaluation if the symptoms worsen or if the condition fails to improve as anticipated. ? ?I provided 15 minutes of non-face-to-face time during this encounter. ? ? ?Teodora Medici, DO ? ?

## 2021-10-10 LAB — SPECIMEN STATUS REPORT

## 2021-10-10 LAB — NOVEL CORONAVIRUS, NAA: SARS-CoV-2, NAA: NOT DETECTED

## 2021-10-28 ENCOUNTER — Ambulatory Visit: Payer: Self-pay

## 2021-10-28 ENCOUNTER — Encounter: Payer: Self-pay | Admitting: Internal Medicine

## 2021-10-28 ENCOUNTER — Ambulatory Visit (INDEPENDENT_AMBULATORY_CARE_PROVIDER_SITE_OTHER): Payer: Medicare Other | Admitting: Internal Medicine

## 2021-10-28 VITALS — BP 108/72 | HR 100 | Temp 98.4°F | Resp 18 | Ht 59.0 in | Wt 134.3 lb

## 2021-10-28 DIAGNOSIS — J0100 Acute maxillary sinusitis, unspecified: Secondary | ICD-10-CM

## 2021-10-28 MED ORDER — AMOXICILLIN-POT CLAVULANATE 875-125 MG PO TABS: 1.0000 | ORAL_TABLET | Freq: Two times a day (BID) | ORAL | 0 refills | Status: AC

## 2021-10-28 MED ORDER — METHYLPREDNISOLONE 4 MG PO TBPK: ORAL_TABLET | ORAL | 0 refills | Status: DC

## 2021-10-28 NOTE — Telephone Encounter (Signed)
?  Chief Complaint: cough ?Symptoms: cough, other symptoms have improved ?Frequency: 2 weeks or more ?Pertinent Negatives: Patient denies SOB or fever ?Disposition: [] ED /[] Urgent Care (no appt availability in office) / [x] Appointment(In office/virtual)/ []  [place] [facility]/ [] Home Care/ [] Refused Recommended Disposition /[] [place] [facility]/ []  Follow-up with PCP ?Additional Notes: pt had VV on 10/09/21 and was advised if symptoms didn't improve to do in person visit. Pt scheduled to come in today at 1420. Pt has been using inhaler and did an albuterol tx yesterday to help with cough but didn't improve.  ? ?Summary: Pt not feeling better after taking benzonatate (TESSALON) 100 MG capsule  ? Pt is calling to report that she took benzonatate (TESSALON) 100 MG capsule and has completed these and still is not feeling better. Pt is having runny nose, cough with phelm.   ?  ? ?Reason for Disposition ? [1] Continuous (nonstop) coughing interferes with work or school AND [2] no improvement using cough treatment per Care Advice ? ?Answer Assessment - Initial Assessment Questions ?1. ONSET: "When did the nasal discharge start?"  ?    10/09/21 ?2. AMOUNT: "How much discharge is there?"  ?    Moderate  ?3. COUGH: "Do you have a cough?" If yes, ask: "Describe the color of your sputum" (clear, white, yellow, green) ?    Dark green  ?4. RESPIRATORY DISTRESS: "Describe your breathing."  ?    no ?5. FEVER: "Do you have a fever?" If Yes, ask: "What is your temperature, how was it measured, and when did it start?" ?    no ?7. OTHER SYMPTOMS: "Do you have any other symptoms?" (e.g., sore throat, earache, wheezing, vomiting) ?    Cough ? ?Protocols used: Common Cold-A-AH, Cough - Acute Productive-A-AH ? ?

## 2021-10-28 NOTE — Progress Notes (Signed)
? ?Acute Office Visit ? ?Subjective:  ? ? Patient ID: Sara Andrews, female    DOB: 26-Jun-1956, 66 y.o.   MRN: [id number] ? ?Chief Complaint  ?Patient presents with  ? Cough  ? Sinusitis  ? ? ?HPI ?Patient is in today for cough. She was seen for similar issue on 10/09/21. At that time she was tested for COVID which was negative. Had symptoms for about 2 weeks. Symptoms appear to be getting worse. Has been taking Valtrex for oral HSV. Got Shingles vaccine on Thursday.  ? ?URI Compliant:  ?-Worst symptom: cough  ?-Fever: no ?-Cough: yes, productive dark green/brown ?-Shortness of breath: no ?-Wheezing: no ?-Chest pain: no ?-Chest tightness: no ?-Chest congestion: no ?-Nasal congestion: yes ?-Runny nose: yes ?-Post nasal drip: no ?-Sneezing: yes ?-Sore throat: no ?-Swollen glands: no ?-Sinus pressure: yes ?-Headache: no ?-Face pain: no ?-Ear pain: no  ?-Ear pressure: no  ?-Fatigue: yes ?-Sick contacts: yes ?-Context: worse ?-Relief with OTC cold/cough medications: no  ?-Treatments attempted:  Trelegy, breathing treatments, Singulair, Claritin, Flonase   ? ? ? ?Past Medical History:  ?Diagnosis Date  ? Anxiety   ? Arthritis   ? "hips, hands, back" (03/10/2016)  ? Asthma   ? Chronic bronchitis (HCC)   ? COPD (chronic obstructive pulmonary disease) (HCC)   ? Cough   ? Depression   ? Esophagitis, reflux   ? GERD (gastroesophageal reflux disease)   ? Hyperlipidemia   ? Hypothyroidism   ? IBS (irritable bowel syndrome)   ? Lumbago   ? Muscle pain   ? Osteoarthritis   ? Sinus disorder   ? Thyroid disease   ? Type 2 diabetes, diet controlled (HCC)   ? Uncomplicated herpes simplex   ? ? ?Past Surgical History:  ?Procedure Laterality Date  ? ABDOMINAL HYSTERECTOMY    ? CARPAL TUNNEL RELEASE Right   ? LUMBAR LAMINECTOMY  03/16/2018  ? L4-5 Laminectomy & Fusion w/ Pedicle Screws, TLIF & Allograft; Surgeon: Virl Diamond, MD; Location: [facility] MAIN OR; Service: Orthopedics; Laterality: N/A; Prone, ProAxis, Stim  Neuromonitoring, O-Arm, Cell Saver, [place], [employer], Justin, PACS on [facility]    ? OOPHORECTOMY Bilateral   ? ORIF TIBIA PLATEAU Left 03/10/2016  ? Procedure: OPEN REDUCTION INTERNAL FIXATION (ORIF) BICONDYLAR TIBIAL PLATEAU FRACTURE;  Surgeon: Eldred Manges, MD;  Location: [facility] OR;  Service: Orthopedics;  Laterality: Left;  ? SHOULDER ARTHROSCOPY W/ ROTATOR CUFF REPAIR Right   ? SHOULDER ARTHROSCOPY WITH SUBACROMIAL DECOMPRESSION, ROTATOR CUFF REPAIR AND BICEP TENDON REPAIR Left 05/21/2021  ? Procedure: SHOULDER ARTHROSCOPY WITH DEBRIDEMENT, DECOMPRESSION, REPAIR OF PARTIAL THICKNESS ROTATOR CUFF REPAIR, BICEPS TENODESIS;  Surgeon: Christena Flake, MD;  Location: [facility] ORS;  Service: Orthopedics;  Laterality: Left;  ? TONSILLECTOMY AND ADENOIDECTOMY    ? ? ?Family History  ?Problem Relation Age of Onset  ? Hypertension Daughter   ? Breast cancer Daughter   ? Diabetes Father   ? Heart disease Father   ? Breast cancer Maternal Aunt 60  ? Breast cancer Paternal Aunt 17  ? Depression Sister   ? Alcohol abuse Sister   ? Anxiety disorder Sister   ? Bipolar disorder Sister   ? Pneumonia Sister   ? Skin cancer Daughter   ? Anxiety disorder Daughter   ? ? ?Social History  ? ?Socioeconomic History  ? Marital status: Widowed  ?  Spouse name: Not on file  ? Number of children: 3  ? Years of education: Not on file  ? Highest  education level: 10th grade  ?Occupational History  ? Occupation: disabled  ?  Comment: retired  ?Tobacco Use  ? Smoking status: Former  ?  Packs/day: 2.00  ?  Years: 30.00  ?  Pack years: 60.00  ?  Types: Cigarettes  ?  Quit date: 09/03/2008  ?  Years since quitting: 13.1  ? Smokeless tobacco: Never  ?Vaping Use  ? Vaping Use: Never used  ?Substance and Sexual Activity  ? Alcohol use: No  ? Drug use: No  ? Sexual activity: Never  ?Other Topics Concern  ? Not on file  ?Social History Narrative  ? She used to work at  [employer], but had a left knee work related injury 03/10/2016 , require left knee surgery and  is now having back pain, that is getting evaluated, Out of work since.   ?   ? Patient recently found out that one of her twins [name](Christie) was diagnosed with breast cancer); will be having a double mastectomy soon.  ?   ? Pt lives alone  ? ?Social Determinants of Health  ? ?Financial Resource Strain: Low Risk   ? Difficulty of Paying Living Expenses: Not hard at all  ?Food Insecurity: No Food Insecurity  ? Worried About Programme researcher, broadcasting/film/videounning Out of Food in the Last Year: Never true  ? Ran Out of Food in the Last Year: Never true  ?Transportation Needs: No Transportation Needs  ? Lack of Transportation (Medical): No  ? Lack of Transportation (Non-Medical): No  ?Physical Activity: Inactive  ? Days of Exercise per Week: 0 days  ? Minutes of Exercise per Session: 0 min  ?Stress: Stress Concern Present  ? Feeling of Stress : To some extent  ?Social Connections: Moderately Integrated  ? Frequency of Communication with Friends and Family: More than three times a week  ? Frequency of Social Gatherings with Friends and Family: More than three times a week  ? Attends Religious Services: More than 4 times per year  ? Active Member of Clubs or Organizations: Yes  ? Attends BankerClub or Organization Meetings: More than 4 times per year  ? Marital Status: Widowed  ?Intimate Partner Violence: Not At Risk  ? Fear of Current or Ex-Partner: No  ? Emotionally Abused: No  ? Physically Abused: No  ? Sexually Abused: No  ? ? ?Outpatient Medications Prior to Visit  ?Medication Sig Dispense Refill  ? albuterol (VENTOLIN HFA) 108 (90 Base) MCG/ACT inhaler INHALE 2 PUFFS INTO THE LUNGS EVERY 6 HOURS AS NEEDED FOR WHEEZING OR SHORTNESS OF BREATH 6.7 g 0  ? benzonatate (TESSALON) 100 MG capsule Take 1 capsule (100 mg total) by mouth 2 (two) times daily as needed for cough. 20 capsule 0  ? buPROPion (WELLBUTRIN XL) 150 MG 24 hr tablet Take 1 tablet (150 mg total) by mouth daily. 90 tablet 1  ? Cholecalciferol (VITAMIN D PO) Take 1,000 Units by mouth daily. 1000  IU    ? DULoxetine (CYMBALTA) 60 MG capsule TAKE 1 CAPSULE(60 MG) BY MOUTH DAILY 90 capsule 1  ? fluticasone (FLONASE) 50 MCG/ACT nasal spray SHAKE LIQUID AND USE 2 SPRAYS IN EACH NOSTRIL DAILY 48 g 0  ? Fluticasone-Umeclidin-Vilant (TRELEGY ELLIPTA) 100-62.5-25 MCG/ACT AEPB Inhale 1 puff into the lungs daily. 3 each 1  ? ipratropium-albuterol (DUONEB) 0.5-2.5 (3) MG/3ML SOLN Take 3 mLs by nebulization 3 (three) times daily as needed. For asthma/copd exacerbation symptoms (instead of albuterol neb) 180 mL 1  ? levothyroxine (SYNTHROID) 75 MCG tablet TAKE 1 TABLET(75  MCG) BY MOUTH DAILY 90 tablet 3  ? lidocaine (XYLOCAINE) 5 % ointment Apply 1 application topically daily as needed (itching).    ? loratadine (CLARITIN) 10 MG tablet TAKE 1 TABLET(10 MG) BY MOUTH AT BEDTIME 90 tablet 1  ? Melatonin 10 MG TABS Take 10 mg by mouth at bedtime.    ? montelukast (SINGULAIR) 10 MG tablet TAKE 1 TABLET(10 MG) BY MOUTH AT BEDTIME 90 tablet 1  ? Propylene Glycol (SYSTANE COMPLETE) 0.6 % SOLN Place 1 drop into both eyes daily as needed (dry eyes).    ? QUEtiapine (SEROQUEL) 25 MG tablet Take 1 tablet (25 mg total) by mouth at bedtime. 90 tablet 0  ? rosuvastatin (CRESTOR) 20 MG tablet Take 1 tablet (20 mg total) by mouth daily. 90 tablet 1  ? SUMAtriptan (IMITREX) 50 MG tablet May repeat in 2 hours if headache persists or recurs. Do not take more than 2 in 24 hours. 10 tablet 0  ? tiZANidine (ZANAFLEX) 2 MG tablet Take 2 mg by mouth 3 (three) times daily.    ? triamcinolone cream (KENALOG) 0.1 % Apply 1 application topically 2 (two) times daily. (Patient taking differently: Apply 1 application. topically 2 (two) times daily as needed (insect bites).) 30 g 0  ? valACYclovir (VALTREX) 500 MG tablet TAKE 1 TABLET(500 MG) BY MOUTH DAILY 90 tablet 1  ? ?No facility-administered medications prior to visit.  ? ? ?No Known Allergies ? ?Review of Systems  ?Constitutional:  Positive for fatigue. Negative for chills and fever.  ?HENT:   Positive for congestion, rhinorrhea and sinus pressure. Negative for ear pain, postnasal drip, sinus pain and sore throat.   ?Respiratory:  Positive for cough. Negative for shortness of breath and wheezing.   ?Cardiovascu

## 2021-10-28 NOTE — Patient Instructions (Signed)
It was great seeing you today! ? ?Plan discussed at today's visit: ?-Continue Trelegy and breathing treatments as needed ?-Continue allergy treatments as you've been doing ?-Steroids and antibiotic sent to pharmacy  ? ?Follow up in: as needed  ? ?Take care and let us know if you have any questions or concerns prior to your next visit. ? ?Dr. Caralee Ates ? ?

## 2021-11-04 ENCOUNTER — Other Ambulatory Visit: Payer: Self-pay | Admitting: Family Medicine

## 2021-11-04 DIAGNOSIS — F5105 Insomnia due to other mental disorder: Secondary | ICD-10-CM

## 2021-11-05 ENCOUNTER — Other Ambulatory Visit: Payer: Self-pay | Admitting: Internal Medicine

## 2021-11-05 ENCOUNTER — Other Ambulatory Visit: Payer: Self-pay | Admitting: Family Medicine

## 2021-11-05 DIAGNOSIS — J432 Centrilobular emphysema: Secondary | ICD-10-CM

## 2021-11-06 ENCOUNTER — Telehealth: Payer: Self-pay

## 2021-11-06 ENCOUNTER — Other Ambulatory Visit: Payer: Self-pay

## 2021-11-06 ENCOUNTER — Ambulatory Visit
Admission: RE | Admit: 2021-11-06 | Discharge: 2021-11-06 | Disposition: A | Discharge status: Home / Self Care | Payer: Medicare Other | Source: Ambulatory Visit | Attending: Internal Medicine | Admitting: Internal Medicine

## 2021-11-06 ENCOUNTER — Ambulatory Visit
Admission: RE | Admit: 2021-11-06 | Discharge: 2021-11-06 | Disposition: A | Discharge status: Home / Self Care | Payer: Medicare Other | Attending: Internal Medicine | Admitting: Internal Medicine

## 2021-11-06 DIAGNOSIS — J069 Acute upper respiratory infection, unspecified: Secondary | ICD-10-CM

## 2021-11-06 DIAGNOSIS — R051 Acute cough: Secondary | ICD-10-CM

## 2021-11-06 NOTE — Telephone Encounter (Signed)
Patient notified, agreed to go today for imaging. ?

## 2021-11-06 NOTE — Telephone Encounter (Signed)
Duplicate request. ?Requested Prescriptions  ?Pending Prescriptions Disp Refills  ?? albuterol (VENTOLIN HFA) 108 (90 Base) MCG/ACT inhaler [Pharmacy Med Name: ALBUTEROL HFA INH (200 PUFFS) 6.7GM] 20.1 g   ?  Sig: INHALE 2 PUFFS INTO THE LUNGS EVERY 6 HOURS AS NEEDED FOR WHEEZING OR SHORTNESS OF BREATH  ?  ? Pulmonology:  Beta Agonists 2 Passed - 11/05/2021 12:36 PM  ?  ?  Passed - Last BP in normal range  ?  BP Readings from Last 1 Encounters:  ?10/28/21 108/72  ?   ?  ?  Passed - Last Heart Rate in normal range  ?  Pulse Readings from Last 1 Encounters:  ?10/28/21 100  ?   ?  ?  Passed - Valid encounter within last 12 months  ?  Recent Outpatient Visits   ?      ? 1 week ago Subacute maxillary sinusitis  ? [facility] Margarita Mail, DO  ? 4 weeks ago Upper respiratory tract infection, unspecified type  ? [facility] Margarita Mail, DO  ? 1 month ago Dyslipidemia associated with type 2 diabetes mellitus (HCC)  ? [facility] Cutler, Danna Hefty, MD  ? 7 months ago Dyslipidemia associated with type 2 diabetes mellitus (HCC)  ? [facility] Alba Cory, MD  ? 8 months ago COVID-19  ? [facility] Alba Cory, MD  ?  ?  ?Future Appointments   ?        ? In 2 months Alba Cory, MD [facility]  ? In 8 months  [facility], [facility]  ?  ? ?  ?  ?  ? ? ?

## 2021-11-06 NOTE — Telephone Encounter (Signed)
Copied from CRM [id number]. Topic: General - Other >> Nov 06, 2021  9:39 AM Pawlus, Maxine Glenn A wrote: Reason for CRM: Pt was advised to contact the office if her cough is not getting better, pt wanted to go ahead with the xray, please advise.

## 2021-11-07 ENCOUNTER — Ambulatory Visit: Payer: Self-pay | Admitting: *Deleted

## 2021-11-07 NOTE — Telephone Encounter (Signed)
2nd attempt to reach pt, left VM to call back. 

## 2021-11-07 NOTE — Telephone Encounter (Signed)
Patient request Dr Carlynn Purl to call in an Rx for her for some cough medication to help with the terrible cough she still have. Say that her Xrays were fine but cough not going away. Please call Ph# [phone]   ?  ?Attempted to reach pt, left VM to call back to discuss request. ?

## 2021-11-07 NOTE — Telephone Encounter (Signed)
Three attempts to reach pt, left VM to call back each attempt. Will route to practice for PCPs review per [facility] protocol ?

## 2021-11-08 NOTE — Telephone Encounter (Signed)
Pt. States she continues to have a productive cough. "Keeps me up at night." Requesting cough medication. Please advise pt. ?Answer Assessment - Initial Assessment Questions ?1. ONSET: "When did the cough begin?"  ?    Last week ?2. SEVERITY: "How bad is the cough today?"  ?    Severe ?3. SPUTUM: "Describe the color of your sputum" (none, dry cough; clear, white, yellow, green) ?    Thick ?4. HEMOPTYSIS: "Are you coughing up any blood?" If so ask: "How much?" (flecks, streaks, tablespoons, etc.) ?    No ?5. DIFFICULTY BREATHING: "Are you having difficulty breathing?" If Yes, ask: "How bad is it?" (e.g., mild, moderate, severe)  ?  - MILD: No SOB at rest, mild SOB with walking, speaks normally in sentences, can lie down, no retractions, pulse < 100.  ?  - MODERATE: SOB at rest, SOB with minimal exertion and prefers to sit, cannot lie down flat, speaks in phrases, mild retractions, audible wheezing, pulse 100-120.  ?  - SEVERE: Very SOB at rest, speaks in single words, struggling to breathe, sitting hunched forward, retractions, pulse > 120  ?    No ?6. FEVER: "Do you have a fever?" If Yes, ask: "What is your temperature, how was it measured, and when did it start?" ?    No ?7. CARDIAC HISTORY: "Do you have any history of heart disease?" (e.g., heart attack, congestive heart failure)  ?    No ?8. LUNG HISTORY: "Do you have any history of lung disease?"  (e.g., pulmonary embolus, asthma, emphysema) ?    COPD ?9. PE RISK FACTORS: "Do you have a history of blood clots?" (or: recent major surgery, recent prolonged travel, bedridden) ?    Yes ?10. OTHER SYMPTOMS: "Do you have any other symptoms?" (e.g., runny nose, wheezing, chest pain) ?      Runny nose ?11. PREGNANCY: "Is there any chance you are pregnant?" "When was your last menstrual period?" ?      No ?12. TRAVEL: "Have you traveled out of the country in the last month?" (e.g., travel history, exposures) ?      No ? ?Protocols used: Cough - Acute Productive-A-AH ? ?

## 2021-11-11 ENCOUNTER — Ambulatory Visit (INDEPENDENT_AMBULATORY_CARE_PROVIDER_SITE_OTHER): Payer: Medicare Other | Admitting: Family Medicine

## 2021-11-11 ENCOUNTER — Encounter: Payer: Self-pay | Admitting: Family Medicine

## 2021-11-11 ENCOUNTER — Other Ambulatory Visit: Payer: Self-pay

## 2021-11-11 VITALS — BP 130/80 | HR 100 | Temp 98.3°F | Resp 18 | Ht 59.0 in | Wt 135.4 lb

## 2021-11-11 DIAGNOSIS — J449 Chronic obstructive pulmonary disease, unspecified: Secondary | ICD-10-CM

## 2021-11-11 MED ORDER — PREDNISONE 20 MG PO TABS: 40.0000 mg | ORAL_TABLET | Freq: Every day | ORAL | 0 refills | Status: AC

## 2021-11-11 MED ORDER — GUAIFENESIN-DM 100-10 MG/5ML PO SYRP: 5.0000 mL | ORAL_SOLUTION | ORAL | 0 refills | Status: DC | PRN

## 2021-11-11 NOTE — Progress Notes (Signed)
? ?  SUBJECTIVE:  ? ?CHIEF COMPLAINT / HPI:  ? ?COUGH ?- seen 4/3 for sinusitis, given augmentin, medrol dose pack. CXR 4/13 negative.  ?- using albuterol ~3x per day. Waking up at night coughing. ?Duration:  1 month ?Circumstances of initial development of cough: URI ?Cough description: productive, greenish sputum ?Aggravating factors:  talking, exercise ?Alleviating factors: none ?Status:  same, no improvement ?Treatments attempted:  tessalon, mucinex, antibiotics, and albuterol ?Shortness of breath:  with coughing ?Chest pain: no ?Chest tightness:no ?Nasal congestion: no ?Runny nose: no ?Hemoptysis: no ?Fevers: no ?Recent foreign travel: no ?Tuberculosis contacts: no ? ? ?OBJECTIVE:  ? ?BP 130/80   Pulse 100   Temp 98.3 ?F (36.8 ?C) (Oral)   Resp 18   Ht 4\' 11"  (1.499 m)   Wt 135 lb 6.4 oz (61.4 kg)   SpO2 98%   BMI 27.35 kg/m?   ?Gen: tired-appearing appearing, non toxic ?Card: RRR ?Lungs: CTAB, frequent hacking coughing ?Ext: WWP, no edema ? ? ?ASSESSMENT/PLAN:  ? ?Asthma-COPD overlap syndrome ([place]) ?With current exacerbation in setting of recent URI. Lungs clear on exam today but with frequent albuterol use and nighttime awakenings, will provide steroid burst. Previous CXR clear. F/u if no better after steroids.  ?  ? ? ?Myles Gip, DO ?

## 2021-11-11 NOTE — Telephone Encounter (Signed)
Lvm for pt to schedule appt.

## 2021-11-11 NOTE — Assessment & Plan Note (Signed)
With current exacerbation in setting of recent URI. Lungs clear on exam today but with frequent albuterol use and nighttime awakenings, will provide steroid burst. Previous CXR clear. F/u if no better after steroids.  ?

## 2021-11-14 ENCOUNTER — Telehealth: Payer: Self-pay | Admitting: Family Medicine

## 2021-11-14 ENCOUNTER — Other Ambulatory Visit: Payer: Self-pay | Admitting: Family Medicine

## 2021-11-14 MED ORDER — TRAZODONE HCL 100 MG PO TABS: 100.0000 mg | ORAL_TABLET | Freq: Every day | ORAL | 0 refills | Status: DC

## 2021-11-14 NOTE — Telephone Encounter (Signed)
Patient is calling to report she was taking the Trazadone and Quetiapine together for sleep- patient was doing fine with sleep of both medications. Patient ran out of the Trazodone( Monday) and has not been sleeping through the night. Patient states she even added Melatonin with out help either. Patient states she was only able to sleep taking both Rx.  ?Patient is calling to see if she can continue with both Rx since that was helping her sleep. Patient advised will send message to provider for review and to see if she has any suggestions about her medications- patient states she has been very ill lately with not sleeping thought the night. ?

## 2021-11-14 NOTE — Telephone Encounter (Signed)
Medication Refill - Medication: traZODone (DESYREL) 100 MG tablet  ? ?Has the patient contacted their pharmacy? No. ?Pt states she used to take this med.  Has run out and states she cannot sleep.  She wakes every night 3 am, cannot go back to sleep. ? ?Preferred Pharmacy (with phone number or street name): [id number] - [place], [place] - [address] AT [facility] OF [address] ? ?Has the patient been seen for an appointment in the last year OR does the patient have an upcoming appointment? Yes.   ? ?Agent: Please be advised that RX refills may take up to 3 business days. We ask that you follow-up with your pharmacy. ? ?

## 2021-11-25 ENCOUNTER — Other Ambulatory Visit: Payer: Self-pay | Admitting: Family Medicine

## 2021-11-25 DIAGNOSIS — Z1231 Encounter for screening mammogram for malignant neoplasm of breast: Secondary | ICD-10-CM

## 2021-11-29 ENCOUNTER — Other Ambulatory Visit: Payer: Self-pay | Admitting: Family Medicine

## 2021-11-29 DIAGNOSIS — J449 Chronic obstructive pulmonary disease, unspecified: Secondary | ICD-10-CM

## 2021-12-05 ENCOUNTER — Other Ambulatory Visit: Payer: Self-pay | Admitting: Family Medicine

## 2021-12-05 ENCOUNTER — Other Ambulatory Visit: Payer: Self-pay | Admitting: Internal Medicine

## 2021-12-05 DIAGNOSIS — J432 Centrilobular emphysema: Secondary | ICD-10-CM

## 2021-12-05 DIAGNOSIS — F325 Major depressive disorder, single episode, in full remission: Secondary | ICD-10-CM

## 2021-12-05 DIAGNOSIS — F5105 Insomnia due to other mental disorder: Secondary | ICD-10-CM

## 2021-12-05 NOTE — Telephone Encounter (Signed)
Requested Prescriptions  ?Pending Prescriptions Disp Refills  ?? albuterol (VENTOLIN HFA) 108 (90 Base) MCG/ACT inhaler [Pharmacy Med Name: ALBUTEROL HFA INH (200 PUFFS) 6.7GM] 6.7 g 0  ?  Sig: INHALE 2 PUFFS INTO THE LUNGS EVERY 6 HOURS AS NEEDED FOR WHEEZING OR SHORTNESS OF BREATH  ?  ? Pulmonology:  Beta Agonists 2 Passed - 12/05/2021  1:19 PM  ?  ?  Passed - Last BP in normal range  ?  BP Readings from Last 1 Encounters:  ?11/11/21 130/80  ?   ?  ?  Passed - Last Heart Rate in normal range  ?  Pulse Readings from Last 1 Encounters:  ?11/11/21 100  ?   ?  ?  Passed - Valid encounter within last 12 months  ?  Recent Outpatient Visits   ?      ? 3 weeks ago Asthma-COPD overlap syndrome ([place])  ? Theodosia, DO  ? 1 month ago Subacute maxillary sinusitis  ? Waller, DO  ? 1 month ago Upper respiratory tract infection, unspecified type  ? Cooper, DO  ? 2 months ago Dyslipidemia associated with type 2 diabetes mellitus ([place])  ? [facility] Maxeys, Drue Stager, MD  ? 8 months ago Dyslipidemia associated with type 2 diabetes mellitus ([place])  ? [facility] Steele Sizer, MD  ?  ?  ?Future Appointments   ?        ? In 1 month Sowles, Drue Stager, MD [facility], [place]  ? In 7 months  [place]  ?  ? ?  ?  ?  ? ? ?

## 2021-12-18 ENCOUNTER — Ambulatory Visit
Admission: RE | Admit: 2021-12-18 | Discharge: 2021-12-18 | Disposition: A | Discharge status: Home / Self Care | Payer: Medicare Other | Source: Ambulatory Visit | Attending: Family Medicine | Admitting: Family Medicine

## 2021-12-18 DIAGNOSIS — Z1231 Encounter for screening mammogram for malignant neoplasm of breast: Secondary | ICD-10-CM | POA: Insufficient documentation

## 2021-12-19 ENCOUNTER — Other Ambulatory Visit: Payer: Self-pay

## 2021-12-19 DIAGNOSIS — Z87891 Personal history of nicotine dependence: Secondary | ICD-10-CM

## 2021-12-19 DIAGNOSIS — Z122 Encounter for screening for malignant neoplasm of respiratory organs: Secondary | ICD-10-CM

## 2021-12-30 ENCOUNTER — Ambulatory Visit
Admission: RE | Admit: 2021-12-30 | Discharge: 2021-12-30 | Disposition: A | Discharge status: Home / Self Care | Payer: Medicare Other | Source: Ambulatory Visit | Attending: Acute Care | Admitting: Acute Care

## 2021-12-30 DIAGNOSIS — Z122 Encounter for screening for malignant neoplasm of respiratory organs: Secondary | ICD-10-CM | POA: Insufficient documentation

## 2021-12-30 DIAGNOSIS — Z87891 Personal history of nicotine dependence: Secondary | ICD-10-CM | POA: Diagnosis not present

## 2021-12-31 ENCOUNTER — Other Ambulatory Visit: Payer: Self-pay | Admitting: Acute Care

## 2021-12-31 ENCOUNTER — Other Ambulatory Visit: Payer: Self-pay | Admitting: Family Medicine

## 2021-12-31 DIAGNOSIS — Z122 Encounter for screening for malignant neoplasm of respiratory organs: Secondary | ICD-10-CM

## 2021-12-31 DIAGNOSIS — J432 Centrilobular emphysema: Secondary | ICD-10-CM

## 2021-12-31 DIAGNOSIS — Z87891 Personal history of nicotine dependence: Secondary | ICD-10-CM

## 2022-01-09 NOTE — Progress Notes (Signed)
Name: Sara Andrews   MRN: [id number]    DOB: [birth year]   Date:01/10/2022       Progress Note  Subjective  Chief Complaint  Follow Up  HPI  Recurrent headaches: doing well on prn imitrex and zofran, she states not as frequent, she described pain as throbbing, photophobia, eye pain during episodes. Stable   DDD and spondylolisthesis: doing well since surgery done 02/2018. She is off pain  medication, taking prn tylenol only and since pain is under control, she used to see Dr. Noel Gerold. She has been out of Tizanidine but would like to resume it for her hand cramps that are worse at night   OA both hands: states it wakes her up during the night at times.   COPD/asthma/Emphysema  : she is back on Trelegy, she rinses her mouth after she uses it.No wheezing or SOB, but continues to have coughing spells that did not improve with tessalon perrles.    Recurrent scalp pruritis: resolves since started taking Valtrex every night . Stable    GERD/dyspesia: she states she is doing well. She states indigestion improve after she stopped Ozempic She takes prn Tums  Bilateral shoulder pian: she had rotator cuff repair right shoulder many years ago and was doing well, had left rotator cuff repair October 22, she had PT and after Reclast injection 01/05 and a couple of weeks later she developed right shoulder and arm pan and also increase in pain of left shoulder. She has been taking ibuprofen, seen by Ortho and was advised to contact Endo, she called Endo and was advised to follow up with me.    Hypothyroidism: She is currently taking 75 mcg daily and half on Mondays, last TSH was at goal .Weight is stable.    DM II : diagnosed at work back in 2012 she used to take medications  She denies polyphagia, polyuria , she has polydipsia.  Started on Trulicity June 2018, but she was having worsening of constipation and indigestion, so we switched to Ozempic 04/2017 weight was 198 lbs and went  down to 132.4  lbs.  She stopped taking Ozempic 07/2019 and weight went up to 160 lbs, we stopped medication due to bloating and nausea, however she asked to go back on medication and has been able to tolerated lower dose of Ozempic at 0.25 mg she stopped medication around 09/22 and has been able to keep weight down without medication. A1C today is still at goal at 5.5 % GFR dropped to 59 FEb and advised her to stop meloxicam , we will recheck next visit She had a recent eye exam done at home by her insurance provider    Major Depression:  no longer seeing Dr. Elna Breslow but taking medication as prescribed - Duloxetind and Wellbutrin  , she has been feeling good however not sleeping well on Trazodone, we changed to seroquel on her last visit but she states she continued taking Trazodone and is able to sleep for over 6 hours but wakes up due to pain , she wants to stop taking Seroquel and stay on Trazodone    Osteoporosis: she had first Reclast injection January 2023 and two weeks later developed bilateral shoulder pain , but she is wiling to go back next year for another infusion   Chest pain: intermittently and states not as often now,  she is under the care of  cardiologist. Dr. Azucena Cecil and had EKG and echo that showed normal EF . CT lung had already shown  some coronary calcification. She is compliant with statin therapy  Last LDL was 54  Senile purpura: reassurance given. Both arms and legs  Unchanged    Atherosclerosis Aorta: found on CT done 03/20/2020, on statin therapy , no side effects of medication , last LDL was 54 and at goal   Patient Active Problem List   Diagnosis Date Noted   Spasms of the hands or feet 01/10/2022   Insomnia due to mental disorder 01/10/2022   Tendinitis of upper biceps tendon of left shoulder 05/22/2021   Rotator cuff tendinitis, left 05/22/2021   Nontraumatic incomplete tear of left rotator cuff 05/22/2021   History of shingles 03/23/2020   Burning sensation of lower extremity  03/22/2020   Centrilobular emphysema (HCC) 01/12/2020   Senile purpura (HCC) 01/12/2020   Major depression in remission (HCC) 01/12/2020   Dyslipidemia associated with type 2 diabetes mellitus (HCC) 01/12/2020   Bursitis of hip 08/23/2019   Carpal tunnel syndrome 08/23/2019   Thoracic aorta atherosclerosis (HCC) 08/03/2019   Spondylosis of lumbar spine 11/06/2016   Spinal stenosis of lumbar region with neurogenic claudication 10/28/2016   Chronic bilateral low back pain with bilateral sciatica 09/16/2016   Moderate episode of recurrent major depressive disorder (HCC) 08/07/2016   Pain of left heel 06/02/2016   Degenerative cervical disc 03/04/2016   Gastroesophageal reflux disease without esophagitis 02/01/2016   Primary osteoarthritis of right hand 02/01/2016   Paresthesias in left hand 02/01/2016   Asthma-COPD overlap syndrome (HCC) 10/05/2013   History of smoking 10/05/2013   Hyperlipidemia 10/05/2013   Adult onset hypothyroidism 10/05/2013    Past Surgical History:  Procedure Laterality Date   ABDOMINAL HYSTERECTOMY     CARPAL TUNNEL RELEASE Right    LUMBAR LAMINECTOMY  03/16/2018   L4-5 Laminectomy & Fusion w/ Pedicle Screws, TLIF & Allograft; Surgeon: Virl DiamondMax William Cohen, MD; Location: [facility]; Service: Orthopedics; Laterality: N/A; Prone, ProAxis, Stim Neuromonitoring, O-Arm, Cell Saver, [employer], [employer], Justin, PACS on [facility]     OOPHORECTOMY Bilateral    ORIF TIBIA PLATEAU Left 03/10/2016   Procedure: OPEN REDUCTION INTERNAL FIXATION (ORIF) BICONDYLAR TIBIAL PLATEAU FRACTURE;  Surgeon: Eldred MangesMark C Yates, MD;  Location: [facility] OR;  Service: Orthopedics;  Laterality: Left;   SHOULDER ARTHROSCOPY W/ ROTATOR CUFF REPAIR Right    SHOULDER ARTHROSCOPY WITH SUBACROMIAL DECOMPRESSION, ROTATOR CUFF REPAIR AND BICEP TENDON REPAIR Left 05/21/2021   Procedure: SHOULDER ARTHROSCOPY WITH DEBRIDEMENT, DECOMPRESSION, REPAIR OF PARTIAL THICKNESS ROTATOR CUFF REPAIR, BICEPS TENODESIS;  Surgeon:  Christena FlakePoggi, John J, MD;  Location: [facility] ORS;  Service: Orthopedics;  Laterality: Left;   TONSILLECTOMY AND ADENOIDECTOMY      Family History  Problem Relation Age of Onset   Hypertension Daughter    Breast cancer Daughter    Diabetes Father    Heart disease Father    Breast cancer Maternal Aunt [id number]   Breast cancer Paternal Aunt [id number]   Depression Sister    Alcohol abuse Sister    Anxiety disorder Sister    Bipolar disorder Sister    Pneumonia Sister    Skin cancer Daughter    Anxiety disorder Daughter     Social History   Tobacco Use   Smoking status: Former    Packs/day: 2.00    Years: 30.00    Total pack years: 60.00    Types: Cigarettes    Quit date: 09/03/2008    Years since quitting: 13.3   Smokeless tobacco: Never  Substance Use Topics   Alcohol use: No  Current Outpatient Medications:    albuterol (VENTOLIN HFA) 108 (90 Base) MCG/ACT inhaler, INHALE 2 PUFFS INTO THE LUNGS EVERY 6 HOURS AS NEEDED FOR WHEEZING OR SHORTNESS OF BREATH, Disp: 6.7 g, Rfl: 0   buPROPion (WELLBUTRIN XL) 150 MG 24 hr tablet, Take 1 tablet (150 mg total) by mouth daily., Disp: 90 tablet, Rfl: 1   Cholecalciferol (VITAMIN D PO), Take 1,000 Units by mouth daily. 1000 IU, Disp: , Rfl:    DULoxetine (CYMBALTA) 60 MG capsule, TAKE 1 CAPSULE(60 MG) BY MOUTH DAILY, Disp: 90 capsule, Rfl: 1   fluticasone (FLONASE) 50 MCG/ACT nasal spray, SHAKE LIQUID AND USE 2 SPRAYS IN EACH NOSTRIL DAILY, Disp: 48 g, Rfl: 0   Fluticasone-Umeclidin-Vilant (TRELEGY ELLIPTA) 100-62.5-25 MCG/ACT AEPB, Inhale 1 puff into the lungs daily., Disp: 3 each, Rfl: 1   ipratropium-albuterol (DUONEB) 0.5-2.5 (3) MG/3ML SOLN, Take 3 mLs by nebulization 3 (three) times daily as needed. For asthma/copd exacerbation symptoms (instead of albuterol neb), Disp: 180 mL, Rfl: 1   levothyroxine (SYNTHROID) 75 MCG tablet, TAKE 1 TABLET(75 MCG) BY MOUTH DAILY, Disp: 90 tablet, Rfl: 3   lidocaine (XYLOCAINE) 5 % ointment, Apply 1 application  topically daily as needed (itching)., Disp: , Rfl:    loratadine (CLARITIN) 10 MG tablet, TAKE 1 TABLET(10 MG) BY MOUTH AT BEDTIME, Disp: 90 tablet, Rfl: 1   Melatonin 10 MG TABS, Take 10 mg by mouth at bedtime., Disp: , Rfl:    montelukast (SINGULAIR) 10 MG tablet, TAKE 1 TABLET(10 MG) BY MOUTH AT BEDTIME, Disp: 90 tablet, Rfl: 1   Propylene Glycol (SYSTANE COMPLETE) 0.6 % SOLN, Place 1 drop into both eyes daily as needed (dry eyes)., Disp: , Rfl:    rosuvastatin (CRESTOR) 20 MG tablet, Take 1 tablet (20 mg total) by mouth daily., Disp: 90 tablet, Rfl: 1   SUMAtriptan (IMITREX) 50 MG tablet, May repeat in 2 hours if headache persists or recurs. Do not take more than 2 in 24 hours., Disp: 10 tablet, Rfl: 0   triamcinolone cream (KENALOG) 0.1 %, Apply 1 application topically 2 (two) times daily. (Patient taking differently: Apply 1 application  topically 2 (two) times daily as needed (insect bites).), Disp: 30 g, Rfl: 0   valACYclovir (VALTREX) 500 MG tablet, TAKE 1 TABLET(500 MG) BY MOUTH DAILY, Disp: 90 tablet, Rfl: 1   tiZANidine (ZANAFLEX) 2 MG tablet, Take 1 tablet (2 mg total) by mouth daily as needed for muscle spasms., Disp: 90 tablet, Rfl: 0   traZODone (DESYREL) 100 MG tablet, Take 1 tablet (100 mg total) by mouth at bedtime., Disp: 90 tablet, Rfl: 0  No Known Allergies  I personally reviewed active problem list, medication list, allergies, family history, social history, health maintenance with the patient/caregiver today.   ROS  Constitutional: Negative for fever or weight change.  Respiratory: positive  for cough, but no  shortness of breath.   Cardiovascular: Negative for chest pain or palpitations.  Gastrointestinal: Negative for abdominal pain, no bowel changes.  Musculoskeletal: Negative for gait problem or joint swelling.  Skin: Negative for rash.  Neurological: Negative for dizziness or headache.  No other specific complaints in a complete review of systems (except as  listed in HPI above).   Objective  Vitals:   01/10/22 1307  BP: 122/78  Pulse: 97  Resp: 14  Temp: 97.8 F (36.6 C)  TempSrc: Oral  SpO2: 100%  Weight: 134 lb 11.2 oz (61.1 kg)  Height: 4\' 11"  (1.499 m)    Body mass  index is 27.21 kg/m.  Physical Exam  Constitutional: Patient appears well-developed and well-nourished.  No distress.  HEENT: head atraumatic, normocephalic, pupils equal and reactive to light, neck supple Cardiovascular: Normal rate, regular rhythm and normal heart sounds.  No murmur heard. No BLE edema. Pulmonary/Chest: Effort normal and breath sounds normal. No respiratory distress. Abdominal: Soft.  There is no tenderness. Psychiatric: Patient has a normal mood and affect. behavior is normal. Judgment and thought content normal.  Skin: ecchymosis on both arms   Recent Results (from the past 2160 hour(s))  POCT HgB A1C     Status: Normal   Collection Time: 01/10/22  1:19 PM  Result Value Ref Range   Hemoglobin A1C 5.5 4.0 - 5.6 %   HbA1c POC (<> result, manual entry)     HbA1c, POC (prediabetic range)     HbA1c, POC (controlled diabetic range)      Diabetic Foot Exam: Diabetic Foot Exam - Simple   Simple Foot Form Visual Inspection No deformities, no ulcerations, no other skin breakdown bilaterally: Yes Sensation Testing Intact to touch and monofilament testing bilaterally: Yes Pulse Check Posterior Tibialis and Dorsalis pulse intact bilaterally: Yes Comments      PHQ2/9:    01/10/2022    1:18 PM 11/11/2021   11:05 AM 10/28/2021    2:09 PM 10/09/2021   10:56 AM 09/11/2021    1:33 PM  Depression screen PHQ 2/9  Decreased Interest 0 0 0 0 0  Down, Depressed, Hopeless 0 0 0 0 0  PHQ - 2 Score 0 0 0 0 0  Altered sleeping 3  0 0 0  Tired, decreased energy 1  0 0 0  Change in appetite 0  0 0 0  Feeling bad or failure about yourself  0  0 0 0  Trouble concentrating 0  0 0 0  Moving slowly or fidgety/restless 0  0 0 0  Suicidal thoughts 0  0 0  0  PHQ-9 Score 4  0 0 0  Difficult doing work/chores Not difficult at all  Not difficult at all Not difficult at all     phq 9 is negative   Fall Risk:    01/10/2022    1:17 PM 11/11/2021   11:05 AM 10/28/2021    2:09 PM 10/09/2021   10:56 AM 09/11/2021    1:33 PM  Fall Risk   Falls in the past year? 0 0 0 0 0  Number falls in past yr:  0 0 0 0  Injury with Fall?  0 0 0 0  Risk for fall due to : No Fall Risks   No Fall Risks No Fall Risks  Follow up Falls prevention discussed Falls evaluation completed  Falls prevention discussed Falls prevention discussed      Functional Status Survey: Is the patient deaf or have difficulty hearing?: No Does the patient have difficulty seeing, even when wearing glasses/contacts?: Yes Does the patient have difficulty concentrating, remembering, or making decisions?: No Does the patient have difficulty walking or climbing stairs?: Yes Does the patient have difficulty dressing or bathing?: No Does the patient have difficulty doing errands alone such as visiting a doctor's office or shopping?: No    Assessment & Plan  1. Dyslipidemia associated with type 2 diabetes mellitus (HCC)  - POCT HgB A1C - [facility] Diabetes Foot Exam  2. Major depression in remission (HCC)   3. Insomnia due to mental disorder  - traZODone (DESYREL) 100 MG tablet; Take 1 tablet (  100 mg total) by mouth at bedtime.  Dispense: 90 tablet; Refill: 0  4. Asthma-COPD overlap syndrome (HCC)  She continues to cough  5. Adult onset hypothyroidism  Last TSH at goal   6. Gastroesophageal reflux disease without esophagitis   7. Thoracic aorta atherosclerosis (HCC)   8. Senile purpura (HCC)   9. Centrilobular emphysema (HCC)   10. Spasms of the hands or feet  - tiZANidine (ZANAFLEX) 2 MG tablet; Take 1 tablet (2 mg total) by mouth daily as needed for muscle spasms.  Dispense: 90 tablet; Refill: 0

## 2022-01-10 ENCOUNTER — Ambulatory Visit (INDEPENDENT_AMBULATORY_CARE_PROVIDER_SITE_OTHER): Payer: Medicare Other | Admitting: Family Medicine

## 2022-01-10 ENCOUNTER — Encounter: Payer: Self-pay | Admitting: Family Medicine

## 2022-01-10 VITALS — BP 122/78 | HR 97 | Temp 97.8°F | Resp 14 | Ht 59.0 in | Wt 134.7 lb

## 2022-01-10 DIAGNOSIS — E038 Other specified hypothyroidism: Secondary | ICD-10-CM

## 2022-01-10 DIAGNOSIS — E1169 Type 2 diabetes mellitus with other specified complication: Secondary | ICD-10-CM | POA: Diagnosis not present

## 2022-01-10 DIAGNOSIS — I700 Atherosclerosis of aorta: Secondary | ICD-10-CM

## 2022-01-10 DIAGNOSIS — K219 Gastro-esophageal reflux disease without esophagitis: Secondary | ICD-10-CM

## 2022-01-10 DIAGNOSIS — E785 Hyperlipidemia, unspecified: Secondary | ICD-10-CM | POA: Diagnosis not present

## 2022-01-10 DIAGNOSIS — F325 Major depressive disorder, single episode, in full remission: Secondary | ICD-10-CM

## 2022-01-10 DIAGNOSIS — F5105 Insomnia due to other mental disorder: Secondary | ICD-10-CM | POA: Diagnosis not present

## 2022-01-10 DIAGNOSIS — J432 Centrilobular emphysema: Secondary | ICD-10-CM

## 2022-01-10 DIAGNOSIS — J449 Chronic obstructive pulmonary disease, unspecified: Secondary | ICD-10-CM

## 2022-01-10 DIAGNOSIS — D692 Other nonthrombocytopenic purpura: Secondary | ICD-10-CM

## 2022-01-10 DIAGNOSIS — R252 Cramp and spasm: Secondary | ICD-10-CM

## 2022-01-10 LAB — POCT GLYCOSYLATED HEMOGLOBIN (HGB A1C): Hemoglobin A1C: 5.5 % (ref 4.0–5.6)

## 2022-01-10 MED ORDER — TRAZODONE HCL 100 MG PO TABS: 100.0000 mg | ORAL_TABLET | Freq: Every day | ORAL | 0 refills | Status: DC

## 2022-01-10 MED ORDER — QUETIAPINE FUMARATE 25 MG PO TABS: ORAL_TABLET | ORAL | 1 refills | Status: DC

## 2022-01-10 MED ORDER — TIZANIDINE HCL 2 MG PO TABS: 2.0000 mg | ORAL_TABLET | Freq: Every day | ORAL | 0 refills | Status: DC | PRN

## 2022-01-26 ENCOUNTER — Other Ambulatory Visit: Payer: Self-pay | Admitting: Family Medicine

## 2022-01-26 DIAGNOSIS — I700 Atherosclerosis of aorta: Secondary | ICD-10-CM

## 2022-01-26 DIAGNOSIS — F325 Major depressive disorder, single episode, in full remission: Secondary | ICD-10-CM

## 2022-01-26 DIAGNOSIS — J449 Chronic obstructive pulmonary disease, unspecified: Secondary | ICD-10-CM

## 2022-01-26 DIAGNOSIS — G43009 Migraine without aura, not intractable, without status migrainosus: Secondary | ICD-10-CM

## 2022-01-26 DIAGNOSIS — K219 Gastro-esophageal reflux disease without esophagitis: Secondary | ICD-10-CM

## 2022-01-26 DIAGNOSIS — E785 Hyperlipidemia, unspecified: Secondary | ICD-10-CM

## 2022-02-06 ENCOUNTER — Ambulatory Visit: Payer: Medicare Other | Admitting: Cardiology

## 2022-02-06 ENCOUNTER — Encounter: Payer: Self-pay | Admitting: Cardiology

## 2022-02-06 VITALS — BP 100/70 | HR 86 | Ht <= 58 in | Wt 134.0 lb

## 2022-02-06 DIAGNOSIS — E7800 Pure hypercholesterolemia, unspecified: Secondary | ICD-10-CM

## 2022-02-06 DIAGNOSIS — G4700 Insomnia, unspecified: Secondary | ICD-10-CM | POA: Diagnosis not present

## 2022-02-06 DIAGNOSIS — J449 Chronic obstructive pulmonary disease, unspecified: Secondary | ICD-10-CM | POA: Diagnosis not present

## 2022-02-06 DIAGNOSIS — I2510 Atherosclerotic heart disease of native coronary artery without angina pectoris: Secondary | ICD-10-CM

## 2022-02-06 NOTE — Patient Instructions (Signed)
Medication Instructions:  Your physician recommends that you continue on your current medications as directed. Please refer to the Current Medication list given to you today.  *If you need a refill on your cardiac medications before your next appointment, please call your pharmacy*   Follow-Up: At [facility], you and your health needs are our priority.  As part of our continuing mission to provide you with exceptional heart care, we have created designated Provider Care Teams.  These Care Teams include your primary Cardiologist (physician) and Advanced Practice Providers (APPs -  Physician Assistants and Nurse Practitioners) who all work together to provide you with the care you need, when you need it.  We recommend signing up for the patient portal called "MyChart".  Sign up information is provided on this After Visit Summary.  MyChart is used to connect with patients for Virtual Visits (Telemedicine).  Patients are able to view lab/test results, encounter notes, upcoming appointments, etc.  Non-urgent messages can be sent to your provider as well.   To learn more about what you can do with MyChart, go to [email or web address].    Your next appointment:   1 year(s)  The format for your next appointment:   In Person  Provider:   You may see Brian Agbor-Etang, MD or one of the following Advanced Practice Providers on your designated Care Team:   Christopher Berge, NP Ryan Dunn, PA-C Cadence Furth, PA-C    Other Instructions   Important Information About Sugar       

## 2022-02-06 NOTE — Progress Notes (Signed)
Cardiology Office Note:    Date:  02/06/2022   ID:  Sara Andrews, DOB 1955/08/23, MRN [id number]  PCP:  Sara Cory, MD  Cardiologist:  None  Electrophysiologist:  None   Referring MD: Sara Cory, MD   Chief Complaint  Patient presents with   Follow-up    12 month F/U-No new cardiac concerns    History of Present Illness:    Sara Andrews is a 66 y.o. female with a hx of CAD (CCTA 09/2019-Ca score 185, 50% LCx, FFRct no stenosis), diabetes, hyperlipidemia, COPD, former smoker x30 years who presents for follow-up.    Being seen due to CAD, hyperlipidemia.  Stop taking aspirin due to easy bruisability.  Denies chest pain, states being very active.  Complains of difficulty sleeping, states snoring sometimes, has had a persistent cough which is nonproductive.   Prior notes Echocardiogram 08/2019 normal systolic and diastolic function, EF 60%,  coronary CTA 09/2019 with calcium score 185, mild to moderate left circumflex stenosis, 50%, FFR CT no significant stenosis.  Past Medical History:  Diagnosis Date   Anxiety    Arthritis    "hips, hands, back" (03/10/2016)   Asthma    Chronic bronchitis (HCC)    COPD (chronic obstructive pulmonary disease) (HCC)    Cough    Depression    Esophagitis, reflux    GERD (gastroesophageal reflux disease)    Hyperlipidemia    Hypothyroidism    IBS (irritable bowel syndrome)    Lumbago    Muscle pain    Osteoarthritis    Sinus disorder    Thyroid disease    Type 2 diabetes, diet controlled (HCC)    Uncomplicated herpes simplex     Past Surgical History:  Procedure Laterality Date   ABDOMINAL HYSTERECTOMY     CARPAL TUNNEL RELEASE Right    LUMBAR LAMINECTOMY  03/16/2018   L4-5 Laminectomy & Fusion w/ Pedicle Screws, TLIF & Allograft; Surgeon: Sara Diamond, MD; Location: [facility] MAIN OR; Service: Orthopedics; Laterality: N/A; Prone, ProAxis, Stim Neuromonitoring, O-Arm, Cell Saver, [employer], [employer], Sara Andrews,  PACS on [facility]     OOPHORECTOMY Bilateral    ORIF TIBIA PLATEAU Left 03/10/2016   Procedure: OPEN REDUCTION INTERNAL FIXATION (ORIF) BICONDYLAR TIBIAL PLATEAU FRACTURE;  Surgeon: Sara Manges, MD;  Location: [facility] OR;  Service: Orthopedics;  Laterality: Left;   SHOULDER ARTHROSCOPY W/ ROTATOR CUFF REPAIR Right    SHOULDER ARTHROSCOPY WITH SUBACROMIAL DECOMPRESSION, ROTATOR CUFF REPAIR AND BICEP TENDON REPAIR Left 05/21/2021   Procedure: SHOULDER ARTHROSCOPY WITH DEBRIDEMENT, DECOMPRESSION, REPAIR OF PARTIAL THICKNESS ROTATOR CUFF REPAIR, BICEPS TENODESIS;  Surgeon: Sara Flake, MD;  Location: [facility] ORS;  Service: Orthopedics;  Laterality: Left;   TONSILLECTOMY AND ADENOIDECTOMY      Current Medications: Current Meds  Medication Sig   albuterol (VENTOLIN HFA) 108 (90 Base) MCG/ACT inhaler INHALE 2 PUFFS INTO THE LUNGS EVERY 6 HOURS AS NEEDED FOR WHEEZING OR SHORTNESS OF BREATH   buPROPion (WELLBUTRIN XL) 150 MG 24 hr tablet TAKE 1 TABLET(150 MG) BY MOUTH DAILY   Cholecalciferol (VITAMIN D PO) Take 1,000 Units by mouth daily. 1000 IU   DULoxetine (CYMBALTA) 60 MG capsule TAKE 1 CAPSULE(60 MG) BY MOUTH DAILY   fluticasone (FLONASE) 50 MCG/ACT nasal spray SHAKE LIQUID AND USE 2 SPRAYS IN EACH NOSTRIL DAILY   Fluticasone-Umeclidin-Vilant (TRELEGY ELLIPTA) 100-62.5-25 MCG/ACT AEPB Inhale 1 puff into the lungs daily.   ipratropium-albuterol (DUONEB) 0.5-2.5 (3) MG/3ML SOLN Take 3 mLs by nebulization 3 (three) times daily  as needed. For asthma/copd exacerbation symptoms (instead of albuterol neb)   levothyroxine (SYNTHROID) 75 MCG tablet TAKE 1 TABLET(75 MCG) BY MOUTH DAILY   lidocaine (XYLOCAINE) 5 % ointment Apply 1 application topically daily as needed (itching).   loratadine (CLARITIN) 10 MG tablet TAKE 1 TABLET(10 MG) BY MOUTH AT BEDTIME   Melatonin 10 MG TABS Take 10 mg by mouth at bedtime.   montelukast (SINGULAIR) 10 MG tablet TAKE 1 TABLET(10 MG) BY MOUTH AT BEDTIME   omeprazole (PRILOSEC) 40  MG capsule Take 40 mg by mouth daily as needed.   Propylene Glycol (SYSTANE COMPLETE) 0.6 % SOLN Place 1 drop into both eyes daily as needed (dry eyes).   rosuvastatin (CRESTOR) 20 MG tablet TAKE 1 TABLET(20 MG) BY MOUTH DAILY   SUMAtriptan (IMITREX) 50 MG tablet TAKE 1 TABLET BY MOUTH. MAY REPEAT IN 2 HOURS IF HEADACHE PERSISTS OR REOCURS. DO NOT TAKE MORE THAN 2 IN 24 HOURS   tiZANidine (ZANAFLEX) 2 MG tablet Take 1 tablet (2 mg total) by mouth daily as needed for muscle spasms.   traZODone (DESYREL) 100 MG tablet Take 1 tablet (100 mg total) by mouth at bedtime.   triamcinolone cream (KENALOG) 0.1 % Apply 1 Application topically 2 (two) times daily as needed.   valACYclovir (VALTREX) 500 MG tablet TAKE 1 TABLET(500 MG) BY MOUTH DAILY     Allergies:   Patient has no known allergies.   Social History   Socioeconomic History   Marital status: Widowed    Spouse name: Not on file   Number of children: 3   Years of education: Not on file   Highest education level: 10th grade  Occupational History   Occupation: disabled    Comment: retired  Tobacco Use   Smoking status: Former    Packs/day: 2.00    Years: 30.00    Total pack years: 60.00    Types: Cigarettes    Quit date: 09/03/2008    Years since quitting: 13.4   Smokeless tobacco: Never  Vaping Use   Vaping Use: Never used  Substance and Sexual Activity   Alcohol use: No   Drug use: No   Sexual activity: Never  Other Topics Concern   Not on file  Social History Narrative   She used to work at  [employer], but had a left knee work related injury 03/10/2016 , require left knee surgery and is now having back pain, that is getting evaluated, Out of work since.       Patient recently found out that one of her twins Sara Andrews) was diagnosed with breast cancer); will be having a double mastectomy soon.      Pt lives alone   Social Determinants of Health   Financial Resource Strain: Low Risk  (07/16/2021)   Overall Financial  Resource Strain (CARDIA)    Difficulty of Paying Living Expenses: Not hard at all  Food Insecurity: No Food Insecurity (07/16/2021)   Hunger Vital Sign    Worried About Running Out of Food in the Last Year: Never true    Ran Out of Food in the Last Year: Never true  Transportation Needs: No Transportation Needs (07/16/2021)   PRAPARE - Administrator, Civil Service (Medical): No    Lack of Transportation (Non-Medical): No  Physical Activity: Inactive (07/16/2021)   Exercise Vital Sign    Days of Exercise per Week: 0 days    Minutes of Exercise per Session: 0 min  Stress: Stress Concern Present (07/16/2021)  [employer] of Occupational Health - Occupational Stress Questionnaire    Feeling of Stress : To some extent  Social Connections: Moderately Integrated (07/16/2021)   Social Connection and Isolation Panel [NHANES]    Frequency of Communication with Friends and Family: More than three times a week    Frequency of Social Gatherings with Friends and Family: More than three times a week    Attends Religious Services: More than 4 times per year    Active Member of [employer] or Organizations: Yes    Attends Banker Meetings: More than 4 times per year    Marital Status: Widowed     Family History: The patient's family history includes Alcohol abuse in her sister; Anxiety disorder in her daughter and sister; Bipolar disorder in her sister; Breast cancer in her daughter; Breast cancer (age of onset: 55) in her maternal aunt and paternal aunt; Depression in her sister; Diabetes in her father; Heart attack in her daughter; Heart disease in her father; Hypertension in her daughter; Pneumonia in her sister; Skin cancer in her daughter.  ROS:   Please see the history of present illness.     All other systems reviewed and are negative.  EKGs/Labs/Other Studies Reviewed:    The following studies were reviewed today:   EKG:  EKG is  ordered today.  The ekg ordered  today demonstrates normal sinus rhythm  Recent Labs: 09/11/2021: ALT 17; BUN 20; Creat 1.04; Potassium 4.7; Sodium 140  Recent Lipid Panel    Component Value Date/Time   CHOL 147 09/11/2021 1419   CHOL 105 03/11/2019 0918   TRIG 65 09/11/2021 1419   HDL 79 09/11/2021 1419   HDL 60 03/11/2019 0918   CHOLHDL 1.9 09/11/2021 1419   LDLCALC 54 09/11/2021 1419    Physical Exam:    VS:  BP 100/70 (BP Location: Left Arm, Patient Position: Sitting, Cuff Size: Normal)   Pulse 86   Ht 4\' 10"  (1.473 m)   Wt 134 lb (60.8 kg)   SpO2 96%   BMI 28.01 kg/m     Wt Readings from Last 3 Encounters:  02/06/22 134 lb (60.8 kg)  01/10/22 134 lb 11.2 oz (61.1 kg)  11/11/21 135 lb 6.4 oz (61.4 kg)     GEN:  Well nourished, well developed in no acute distress HEENT: Normal NECK: No JVD; No carotid bruits CARDIAC: RRR, no murmurs, rubs, gallops RESPIRATORY: Decreased breath sounds, rhonchi at bases. ABDOMEN: Soft, non-tender, non-distended MUSCULOSKELETAL:  No edema; No deformity  SKIN: Warm and dry NEUROLOGIC:  Alert and oriented x 3 PSYCHIATRIC:  Normal affect   ASSESSMENT:    1. Coronary artery disease involving native coronary artery of native heart without angina pectoris   2. Pure hypercholesterolemia   3. Insomnia, unspecified type   4. Chronic obstructive pulmonary disease, unspecified COPD type (HCC)     PLAN:    In order of problems listed above:  nonobstructive CAD, mod LCx disease, coronary calcium score 185, EF 60 to 65%.  Denies chest pain.    Continue Crestor 20 mg daily.  Not on aspirin due to easy bruisability Hyperlipidemia, LDL controlled, continue Crestor 20 mg daily. Insomnia, cough, history of COPD.  Unsure if she has sleep apnea, refer to pulmonary medicine.  Follow-up yearly.  This note was generated in part or whole with voice recognition software. Voice recognition is usually quite accurate but there are transcription errors that can and very often do occur.  I apologize for any  typographical errors that were not detected and corrected.  Medication Adjustments/Labs and Tests Ordered: Current medicines are reviewed at length with the patient today.  Concerns regarding medicines are outlined above.  Orders Placed This Encounter  Procedures   Ambulatory referral to Pulmonology   EKG 12-Lead   No orders of the defined types were placed in this encounter.   Patient Instructions  Medication Instructions:   Your physician recommends that you continue on your current medications as directed. Please refer to the Current Medication list given to you today.  *If you need a refill on your cardiac medications before your next appointment, please call your pharmacy*    Follow-Up: At [facility], you and your health needs are our priority.  As part of our continuing mission to provide you with exceptional heart care, we have created designated Provider Care Teams.  These Care Teams include your primary Cardiologist (physician) and Advanced Practice Providers (APPs -  Physician Assistants and Nurse Practitioners) who all work together to provide you with the care you need, when you need it.  We recommend signing up for the patient portal called "MyChart".  Sign up information is provided on this After Visit Summary.  MyChart is used to connect with patients for Virtual Visits (Telemedicine).  Patients are able to view lab/test results, encounter notes, upcoming appointments, etc.  Non-urgent messages can be sent to your provider as well.   To learn more about what you can do with MyChart, go to [email or web address].    Your next appointment:   1 year(s)  The format for your next appointment:   In Person  Provider:   You may see Debbe Odea, MD or one of the following Advanced Practice Providers on your designated Care Team:   Nicolasa Ducking, NP Eula Listen, PA-C Cadence Fransico Michael, [place]    Other Instructions   Important Information  About Sugar         Signed, Debbe Odea, MD  02/06/2022 12:08 PM    [facility]

## 2022-02-18 ENCOUNTER — Other Ambulatory Visit: Payer: Self-pay | Admitting: Nurse Practitioner

## 2022-02-18 DIAGNOSIS — J432 Centrilobular emphysema: Secondary | ICD-10-CM

## 2022-02-19 NOTE — Telephone Encounter (Signed)
Requested Prescriptions  Pending Prescriptions Disp Refills  . albuterol (VENTOLIN HFA) 108 (90 Base) MCG/ACT inhaler [Pharmacy Med Name: ALBUTEROL HFA INH (200 PUFFS) 6.7GM] 6.7 g 1    Sig: INHALE 2 PUFFS INTO THE LUNGS EVERY 6 HOURS AS NEEDED FOR WHEEZING OR SHORTNESS OF BREATH     Pulmonology:  Beta Agonists 2 Passed - 02/18/2022 11:54 AM      Passed - Last BP in normal range    BP Readings from Last 1 Encounters:  02/06/22 100/70         Passed - Last Heart Rate in normal range    Pulse Readings from Last 1 Encounters:  02/06/22 86         Passed - Valid encounter within last 12 months    Recent Outpatient Visits          1 month ago Dyslipidemia associated with type 2 diabetes mellitus [facility])   [facility] Alba Cory, MD   3 months ago Asthma-COPD overlap syndrome [facility])   [facility] [place] Dense M, DO   3 months ago Subacute maxillary sinusitis   [facility] Margarita Mail, DO   4 months ago Upper respiratory tract infection, unspecified type   [facility] Margarita Mail, DO   5 months ago Dyslipidemia associated with type 2 diabetes mellitus [facility])   [facility] Alba Cory, MD      Future Appointments            In 2 months Carlynn Purl, Danna Hefty, MD [facility]   In 4 months  [facility]

## 2022-02-23 ENCOUNTER — Other Ambulatory Visit: Payer: Self-pay | Admitting: Family Medicine

## 2022-02-23 DIAGNOSIS — F5105 Insomnia due to other mental disorder: Secondary | ICD-10-CM

## 2022-02-23 DIAGNOSIS — J449 Chronic obstructive pulmonary disease, unspecified: Secondary | ICD-10-CM

## 2022-02-23 DIAGNOSIS — J432 Centrilobular emphysema: Secondary | ICD-10-CM

## 2022-02-23 DIAGNOSIS — F325 Major depressive disorder, single episode, in full remission: Secondary | ICD-10-CM

## 2022-02-24 ENCOUNTER — Other Ambulatory Visit: Payer: Self-pay

## 2022-02-24 DIAGNOSIS — J449 Chronic obstructive pulmonary disease, unspecified: Secondary | ICD-10-CM

## 2022-03-08 ENCOUNTER — Other Ambulatory Visit: Payer: Self-pay | Admitting: Family Medicine

## 2022-03-08 DIAGNOSIS — G2581 Restless legs syndrome: Secondary | ICD-10-CM

## 2022-04-04 ENCOUNTER — Ambulatory Visit: Payer: Self-pay

## 2022-04-04 NOTE — Telephone Encounter (Signed)
   Chief Complaint: Feeling depressed. "My medications aren't helping." Appointment made for 04/08/22. Would like to be seen sooner. Symptoms: Affecting her sleep, appetite. Frequency: 2 weeks Pertinent Negatives: Patient denies thoughts of self-harm. Disposition: [] ED /[] Urgent Care (no appt availability in office) / [x] Appointment(In office/virtual)/ []  [facility]/ [] Home Care/ [] Refused Recommended Disposition /[] [place] [facility]/ []  Follow-up with PCP Additional Notes:   Reason for Disposition  [1] Depression AND [2] worsening (e.g., sleeping poorly, less able to do activities of daily living)  Answer Assessment - Initial Assessment Questions 1. CONCERN: "What happened that made you call today?"     Feeling depressed 2. DEPRESSION SYMPTOM SCREENING: "How are you feeling overall?" (e.g., decreased energy, increased sleeping or difficulty sleeping, difficulty concentrating, feelings of sadness, guilt, hopelessness, or worthlessness)     Depressed 3. RISK OF HARM - SUICIDAL IDEATION:  "Do you ever have thoughts of hurting or killing yourself?"  (e.g., yes, no, no but preoccupation with thoughts about death)   - INTENT:  "Do you have thoughts of hurting or killing yourself right NOW?" (e.g., yes, no, N/A)   - PLAN: "Do you have a specific plan for how you would do this?" (e.g., gun, knife, overdose, no plan, N/A)     No 4. RISK OF HARM - HOMICIDAL IDEATION:  "Do you ever have thoughts of hurting or killing someone else?"  (e.g., yes, no, no but preoccupation with thoughts about death)   - INTENT:  "Do you have thoughts of hurting or killing someone right NOW?" (e.g., yes, no, N/A)   - PLAN: "Do you have a specific plan for how you would do this?" (e.g., gun, knife, no plan, N/A)      No 5. FUNCTIONAL IMPAIRMENT: "How have things been going for you overall? Have you had more difficulty than usual doing your normal daily activities?"  (e.g., better, same, worse; self-care,  school, work, interactions)     Not sleeping well, decreased appetite 6. SUPPORT: "Who is with you now?" "Who do you live with?" "Do you have family or friends who you can talk to?"      Family 7. THERAPIST: "Do you have a counselor or therapist? Name?"     No 8. STRESSORS: "Has there been any new stress or recent changes in your life?"     No 9. ALCOHOL USE OR SUBSTANCE USE (DRUG USE): "Do you drink alcohol or use any illegal drugs?"     No 10. OTHER: "Do you have any other physical symptoms right now?" (e.g., fever)       No 11. PREGNANCY: "Is there any chance you are pregnant?" "When was your last menstrual period?"       No  Protocols used: Depression-A-AH

## 2022-04-07 NOTE — Progress Notes (Unsigned)
Name: Sara Andrews   MRN: [id number]    DOB: 02-Oct-1955   Date:04/08/2022       Progress Note  Subjective  Chief Complaint  Depression  HPI   Major Depression:  no longer seeing Dr. Elna Breslow but taking medication as prescribed - Duloxetine and Wellbutrin during the day and Trazodone at night, she feels anxious at night and keeps scratching her head ( she feels it is her nerves), we tried to switched from Trazodone to Seroquel but she kept taking both medication. She is feeling more down lately, she is crying " for no reason", she has been gaining weight and it upsets her, her oldest daughter has a drinking problem, she also gets jealous of her relationship with her youngest sister. She is having more pain lately that causes radiculitis on both hands - under the care of Ortho - Discussed referring her back to psychiatrist and adding Abilify and she is willing to try it   She is still able to keep her house clean, she enjoys works in her yard, she states during the winter she likes doing craft work.    Patient Active Problem List   Diagnosis Date Noted   Spasms of the hands or feet 01/10/2022   Insomnia due to mental disorder 01/10/2022   Tendinitis of upper biceps tendon of left shoulder 05/22/2021   Rotator cuff tendinitis, left 05/22/2021   Nontraumatic incomplete tear of left rotator cuff 05/22/2021   History of shingles 03/23/2020   Burning sensation of lower extremity 03/22/2020   Centrilobular emphysema (HCC) 01/12/2020   Senile purpura (HCC) 01/12/2020   Major depression in remission (HCC) 01/12/2020   Dyslipidemia associated with type 2 diabetes mellitus (HCC) 01/12/2020   Bursitis of hip 08/23/2019   Carpal tunnel syndrome 08/23/2019   Thoracic aorta atherosclerosis (HCC) 08/03/2019   Spondylosis of lumbar spine 11/06/2016   Spinal stenosis of lumbar region with neurogenic claudication 10/28/2016   Chronic bilateral low back pain with bilateral sciatica 09/16/2016    Moderate episode of recurrent major depressive disorder (HCC) 08/07/2016   Pain of left heel 06/02/2016   Degenerative cervical disc 03/04/2016   Gastroesophageal reflux disease without esophagitis 02/01/2016   Primary osteoarthritis of right hand 02/01/2016   Paresthesias in left hand 02/01/2016   Asthma-COPD overlap syndrome (HCC) 10/05/2013   History of smoking 10/05/2013   Hyperlipidemia 10/05/2013   Adult onset hypothyroidism 10/05/2013    Past Surgical History:  Procedure Laterality Date   ABDOMINAL HYSTERECTOMY     CARPAL TUNNEL RELEASE Right    LUMBAR LAMINECTOMY  03/16/2018   L4-5 Laminectomy & Fusion w/ Pedicle Screws, TLIF & Allograft; Surgeon: Virl Diamond, MD; Location: [facility] MAIN OR; Service: Orthopedics; Laterality: N/A; Prone, ProAxis, Stim Neuromonitoring, O-Arm, Cell Saver, [employer], [employer], Justin, PACS on [facility]     OOPHORECTOMY Bilateral    ORIF TIBIA PLATEAU Left 03/10/2016   Procedure: OPEN REDUCTION INTERNAL FIXATION (ORIF) BICONDYLAR TIBIAL PLATEAU FRACTURE;  Surgeon: Eldred Manges, MD;  Location: [facility];  Service: Orthopedics;  Laterality: Left;   SHOULDER ARTHROSCOPY W/ ROTATOR CUFF REPAIR Right    SHOULDER ARTHROSCOPY WITH SUBACROMIAL DECOMPRESSION, ROTATOR CUFF REPAIR AND BICEP TENDON REPAIR Left 05/21/2021   Procedure: SHOULDER ARTHROSCOPY WITH DEBRIDEMENT, DECOMPRESSION, REPAIR OF PARTIAL THICKNESS ROTATOR CUFF REPAIR, BICEPS TENODESIS;  Surgeon: Christena Flake, MD;  Location: [facility] ORS;  Service: Orthopedics;  Laterality: Left;   TONSILLECTOMY AND ADENOIDECTOMY      Family History  Problem Relation Age of Onset  Diabetes Father    Heart disease Father    Depression Sister    Alcohol abuse Sister    Anxiety disorder Sister    Bipolar disorder Sister    Pneumonia Sister    Breast cancer Maternal Aunt 56   Breast cancer Paternal Aunt 70   Hypertension Daughter    Breast cancer Daughter    Skin cancer Daughter    Heart attack Daughter     Anxiety disorder Daughter     Social History   Tobacco Use   Smoking status: Former    Packs/day: 2.00    Years: 30.00    Total pack years: 60.00    Types: Cigarettes    Quit date: 09/03/2008    Years since quitting: 13.6   Smokeless tobacco: Never  Substance Use Topics   Alcohol use: No     Current Outpatient Medications:    albuterol (VENTOLIN HFA) 108 (90 Base) MCG/ACT inhaler, INHALE 2 PUFFS INTO THE LUNGS EVERY 6 HOURS AS NEEDED FOR WHEEZING OR SHORTNESS OF BREATH, Disp: 6.7 g, Rfl: 1   buPROPion (WELLBUTRIN XL) 150 MG 24 hr tablet, TAKE 1 TABLET(150 MG) BY MOUTH DAILY, Disp: 90 tablet, Rfl: 1   Cholecalciferol (VITAMIN D PO), Take 1,000 Units by mouth daily. 1000 IU, Disp: , Rfl:    DULoxetine (CYMBALTA) 60 MG capsule, TAKE 1 CAPSULE(60 MG) BY MOUTH DAILY, Disp: 90 capsule, Rfl: 1   ipratropium-albuterol (DUONEB) 0.5-2.5 (3) MG/3ML SOLN, Take 3 mLs by nebulization 3 (three) times daily as needed. For asthma/copd exacerbation symptoms (instead of albuterol neb), Disp: 180 mL, Rfl: 1   levothyroxine (SYNTHROID) 75 MCG tablet, TAKE 1 TABLET(75 MCG) BY MOUTH DAILY, Disp: 90 tablet, Rfl: 3   lidocaine (XYLOCAINE) 5 % ointment, Apply 1 application topically daily as needed (itching)., Disp: , Rfl:    loratadine (CLARITIN) 10 MG tablet, TAKE 1 TABLET(10 MG) BY MOUTH AT BEDTIME, Disp: 90 tablet, Rfl: 1   Melatonin 10 MG TABS, Take 10 mg by mouth at bedtime., Disp: , Rfl:    montelukast (SINGULAIR) 10 MG tablet, TAKE 1 TABLET(10 MG) BY MOUTH AT BEDTIME, Disp: 90 tablet, Rfl: 1   omeprazole (PRILOSEC) 40 MG capsule, Take 40 mg by mouth daily as needed., Disp: , Rfl:    Propylene Glycol (SYSTANE COMPLETE) 0.6 % SOLN, Place 1 drop into both eyes daily as needed (dry eyes)., Disp: , Rfl:    rosuvastatin (CRESTOR) 20 MG tablet, TAKE 1 TABLET(20 MG) BY MOUTH DAILY, Disp: 90 tablet, Rfl: 1   SUMAtriptan (IMITREX) 50 MG tablet, TAKE 1 TABLET BY MOUTH. MAY REPEAT IN 2 HOURS IF HEADACHE PERSISTS OR  REOCURS. DO NOT TAKE MORE THAN 2 IN 24 HOURS, Disp: 10 tablet, Rfl: 0   tiZANidine (ZANAFLEX) 2 MG tablet, Take 1 tablet (2 mg total) by mouth daily as needed for muscle spasms., Disp: 90 tablet, Rfl: 0   traZODone (DESYREL) 100 MG tablet, Take 1 tablet (100 mg total) by mouth at bedtime., Disp: 90 tablet, Rfl: 0   TRELEGY ELLIPTA 100-62.5-25 MCG/ACT AEPB, INHALE 1 PUFF INTO THE LUNGS DAILY, Disp: 180 each, Rfl: 1   triamcinolone cream (KENALOG) 0.1 %, Apply 1 Application topically 2 (two) times daily as needed., Disp: , Rfl:    valACYclovir (VALTREX) 500 MG tablet, TAKE 1 TABLET(500 MG) BY MOUTH DAILY, Disp: 90 tablet, Rfl: 1   fluticasone (FLONASE) 50 MCG/ACT nasal spray, SHAKE LIQUID AND USE 2 SPRAYS IN EACH NOSTRIL DAILY (Patient not taking: Reported on 04/08/2022),  Disp: 48 g, Rfl: 0  No Known Allergies  I personally reviewed active problem list, medication list, allergies, family history, social history, health maintenance with the patient/caregiver today.   ROS  Ten systems reviewed and is negative except as mentioned in HPI   Objective  Vitals:   04/08/22 0840  BP: 134/72  Pulse: 96  Resp: 16  SpO2: 99%  Weight: 138 lb (62.6 kg)  Height: 4\' 10"  (1.473 m)    Body mass index is 28.84 kg/m.  Physical Exam  Constitutional: Patient appears well-developed and well-nourished.  No distress.  HEENT: head atraumatic, normocephalic, pupils equal and reactive to light, neck supple Cardiovascular: Normal rate, regular rhythm and normal heart sounds.  No murmur heard. No BLE edema. Pulmonary/Chest: Effort normal and breath sounds normal. No respiratory distress. Abdominal: Soft.  There is no tenderness. Psychiatric: Patient has a depressed mood , cried during her visit Judgment and thought content normal.   Recent Results (from the past 2160 hour(s))  POCT HgB A1C     Status: Normal   Collection Time: 01/10/22  1:19 PM  Result Value Ref Range   Hemoglobin A1C 5.5 4.0 - 5.6 %    HbA1c POC (<> result, manual entry)     HbA1c, POC (prediabetic range)     HbA1c, POC (controlled diabetic range)      PHQ2/9:    04/08/2022    8:45 AM 01/10/2022    1:18 PM 11/11/2021   11:05 AM 10/28/2021    2:09 PM 10/09/2021   10:56 AM  Depression screen PHQ 2/9  Decreased Interest 2 0 0 0 0  Down, Depressed, Hopeless 3 0 0 0 0  PHQ - 2 Score 5 0 0 0 0  Altered sleeping 3 3  0 0  Tired, decreased energy 1 1  0 0  Change in appetite 0 0  0 0  Feeling bad or failure about yourself  0 0  0 0  Trouble concentrating 0 0  0 0  Moving slowly or fidgety/restless 0 0  0 0  Suicidal thoughts 0 0  0 0  PHQ-9 Score 9 4  0 0  Difficult doing work/chores  Not difficult at all  Not difficult at all Not difficult at all    phq 9 is positive   Fall Risk:    04/08/2022    8:40 AM 01/10/2022    1:17 PM 11/11/2021   11:05 AM 10/28/2021    2:09 PM 10/09/2021   10:56 AM  Fall Risk   Falls in the past year? 0 0 0 0 0  Number falls in past yr: 0  0 0 0  Injury with Fall? 0  0 0 0  Risk for fall due to : No Fall Risks No Fall Risks   No Fall Risks  Follow up Falls prevention discussed Falls prevention discussed Falls evaluation completed  Falls prevention discussed      Functional Status Survey: Is the patient deaf or have difficulty hearing?: No Does the patient have difficulty seeing, even when wearing glasses/contacts?: No Does the patient have difficulty concentrating, remembering, or making decisions?: No Does the patient have difficulty walking or climbing stairs?: No Does the patient have difficulty dressing or bathing?: No Does the patient have difficulty doing errands alone such as visiting a doctor's office or shopping?: No    Assessment & Plan  1. Major depression, recurrent, chronic (HCC)  - Ambulatory referral to Psychiatry - ARIPiprazole (ABILIFY) 2 MG tablet; Take  1 tablet (2 mg total) by mouth daily.  Dispense: 30 tablet; Refill: 0

## 2022-04-07 NOTE — Telephone Encounter (Signed)
Lvm to inform pt we did not have anything earleir

## 2022-04-08 ENCOUNTER — Encounter: Payer: Self-pay | Admitting: Family Medicine

## 2022-04-08 ENCOUNTER — Ambulatory Visit (INDEPENDENT_AMBULATORY_CARE_PROVIDER_SITE_OTHER): Payer: Medicare Other | Admitting: Family Medicine

## 2022-04-08 VITALS — BP 134/72 | HR 96 | Resp 16 | Ht <= 58 in | Wt 138.0 lb

## 2022-04-08 DIAGNOSIS — Z91038 Other insect allergy status: Secondary | ICD-10-CM

## 2022-04-08 DIAGNOSIS — F339 Major depressive disorder, recurrent, unspecified: Secondary | ICD-10-CM | POA: Diagnosis not present

## 2022-04-08 MED ORDER — TRIAMCINOLONE ACETONIDE 0.1 % EX CREA: 1.0000 | TOPICAL_CREAM | Freq: Two times a day (BID) | CUTANEOUS | 0 refills | Status: DC | PRN

## 2022-04-08 MED ORDER — ARIPIPRAZOLE 2 MG PO TABS: 2.0000 mg | ORAL_TABLET | Freq: Every day | ORAL | 0 refills | Status: DC

## 2022-04-15 ENCOUNTER — Other Ambulatory Visit: Payer: Self-pay | Admitting: Nurse Practitioner

## 2022-04-15 DIAGNOSIS — J432 Centrilobular emphysema: Secondary | ICD-10-CM

## 2022-04-16 NOTE — Telephone Encounter (Signed)
Requested Prescriptions  Pending Prescriptions Disp Refills  . albuterol (VENTOLIN HFA) 108 (90 Base) MCG/ACT inhaler [Pharmacy Med Name: ALBUTEROL HFA INH (200 PUFFS) 6.7GM] 6.7 g 0    Sig: INHALE 2 PUFFS INTO THE LUNGS EVERY 6 HOURS AS NEEDED FOR WHEEZING OR SHORTNESS OF BREATH     Pulmonology:  Beta Agonists 2 Passed - 04/15/2022  1:04 PM      Passed - Last BP in normal range    BP Readings from Last 1 Encounters:  04/08/22 134/72         Passed - Last Heart Rate in normal range    Pulse Readings from Last 1 Encounters:  04/08/22 96         Passed - Valid encounter within last 12 months    Recent Outpatient Visits          1 week ago Major depression, recurrent, chronic ([place])   [facility] [place], Drue Stager, MD   3 months ago Dyslipidemia associated with type 2 diabetes mellitus [facility])   [facility] Steele Sizer, MD   5 months ago Asthma-COPD overlap syndrome [facility])   [place], DO   5 months ago Subacute maxillary sinusitis   [facility] Teodora Medici, DO   6 months ago Upper respiratory tract infection, unspecified type   [facility] Teodora Medici, DO      Future Appointments            In 3 weeks Steele Sizer, MD [facility], [place]   In 3 months  [place]

## 2022-05-02 ENCOUNTER — Other Ambulatory Visit: Payer: Self-pay | Admitting: Student

## 2022-05-02 DIAGNOSIS — M47812 Spondylosis without myelopathy or radiculopathy, cervical region: Secondary | ICD-10-CM

## 2022-05-02 DIAGNOSIS — M5412 Radiculopathy, cervical region: Secondary | ICD-10-CM

## 2022-05-04 ENCOUNTER — Other Ambulatory Visit: Payer: Self-pay | Admitting: Family Medicine

## 2022-05-04 DIAGNOSIS — G2581 Restless legs syndrome: Secondary | ICD-10-CM

## 2022-05-06 ENCOUNTER — Telehealth: Payer: Self-pay | Admitting: Nurse Practitioner

## 2022-05-06 ENCOUNTER — Other Ambulatory Visit: Payer: Self-pay

## 2022-05-06 ENCOUNTER — Telehealth: Payer: Self-pay | Admitting: Family Medicine

## 2022-05-06 DIAGNOSIS — F339 Major depressive disorder, recurrent, unspecified: Secondary | ICD-10-CM

## 2022-05-06 DIAGNOSIS — J432 Centrilobular emphysema: Secondary | ICD-10-CM

## 2022-05-06 NOTE — Telephone Encounter (Signed)
Referral denied due to being dismissed from practice. Sent in a new referral to [facility]. Patient aware.

## 2022-05-06 NOTE — Telephone Encounter (Signed)
Requested by interface surescripts. Last refill 04/16/22. Too soon Requested Prescriptions  Refused Prescriptions Disp Refills  . albuterol (VENTOLIN HFA) 108 (90 Base) MCG/ACT inhaler [Pharmacy Med Name: ALBUTEROL HFA INH (200 PUFFS) 6.7GM] 6.7 g 0    Sig: INHALE 2 PUFFS INTO THE LUNGS EVERY 6 HOURS AS NEEDED FOR WHEEZING OR SHORTNESS OF BREATH     Pulmonology:  Beta Agonists 2 Passed - 05/06/2022  2:10 PM      Passed - Last BP in normal range    BP Readings from Last 1 Encounters:  04/08/22 134/72         Passed - Last Heart Rate in normal range    Pulse Readings from Last 1 Encounters:  04/08/22 96         Passed - Valid encounter within last 12 months    Recent Outpatient Visits          4 weeks ago Major depression, recurrent, chronic ([place])   [facility], Drue Stager, MD   3 months ago Dyslipidemia associated with type 2 diabetes mellitus [facility])   [facility] Steele Sizer, MD   5 months ago Asthma-COPD overlap syndrome [facility])   [place], DO   6 months ago Subacute maxillary sinusitis   [place], DO   6 months ago Upper respiratory tract infection, unspecified type   [facility] Teodora Medici, DO      Future Appointments            In 1 week Steele Sizer, MD [facility], [place]   In 2 months  [place]

## 2022-05-06 NOTE — Telephone Encounter (Signed)
Copied from [place] [id number]. Topic: Referral - Status >> May 06, 2022 12:34 PM Tiffany B wrote: Reason for CRM: Patient checking on the status of psychiatrist referral

## 2022-05-07 ENCOUNTER — Other Ambulatory Visit: Payer: Self-pay | Admitting: Family Medicine

## 2022-05-07 ENCOUNTER — Ambulatory Visit: Payer: Self-pay | Admitting: *Deleted

## 2022-05-07 DIAGNOSIS — F339 Major depressive disorder, recurrent, unspecified: Secondary | ICD-10-CM

## 2022-05-07 NOTE — Telephone Encounter (Signed)
Medication Refill - Medication: ARIPiprazole (ABILIFY) 2 MG tablet  Has the patient contacted their pharmacy? No. (Agent: If no, request that the patient contact the pharmacy for the refill. If patient does not wish to contact the pharmacy document the reason why and proceed with request.) (Agent: If yes, when and what did the pharmacy advise?)  Preferred Pharmacy (with phone number or street name):  [facility] [place], [address] Phone:  [phone]  Fax:  [phone]     Has the patient been seen for an appointment in the last year OR does the patient have an upcoming appointment? Yes.    Agent: Please be advised that RX refills may take up to 3 business days. We ask that you follow-up with your pharmacy.

## 2022-05-07 NOTE — Telephone Encounter (Signed)
Requested medication (s) are due for refill today: yes  Requested medication (s) are on the active medication list: yes  Last refill:  04/08/22 #30 0 refills  Future visit scheduled: yes in 6 days  Notes to clinic:  not delegated per protocol. Patient out of medication x 1 week . See NT encounter.      Requested Prescriptions  Pending Prescriptions Disp Refills   ARIPiprazole (ABILIFY) 2 MG tablet 30 tablet 0    Sig: Take 1 tablet (2 mg total) by mouth daily.     Not Delegated - Psychiatry:  Antipsychotics - Second Generation (Atypical) - aripiprazole Failed - 05/07/2022  2:17 PM      Failed - This refill cannot be delegated      Failed - TSH in normal range and within 360 days    TSH  Date Value Ref Range Status  09/17/2020 0.93 0.40 - 4.50 mIU/L Final         Failed - Lipid Panel in normal range within the last 12 months    Cholesterol, Total  Date Value Ref Range Status  03/11/2019 105 100 - 199 mg/dL Final   Cholesterol  Date Value Ref Range Status  09/11/2021 147 <200 mg/dL Final   LDL Cholesterol (Calc)  Date Value Ref Range Status  09/11/2021 54 mg/dL (calc) Final    Comment:    Reference range: <100 . Desirable range <100 mg/dL for primary prevention;   <70 mg/dL for patients with CHD or diabetic patients  with > or = 2 CHD risk factors. Marland Kitchen LDL-C is now calculated using the Martin-Hopkins  calculation, which is a validated novel method providing  better accuracy than the Friedewald equation in the  estimation of LDL-C.  Cresenciano Genre et al. Annamaria Helling. 2956;213(08): 2061-2068  (http://education.[employer].com/faq/FAQ164)    HDL  Date Value Ref Range Status  09/11/2021 79 > OR = 50 mg/dL Final  03/11/2019 60 >39 mg/dL Final   Triglycerides  Date Value Ref Range Status  09/11/2021 65 <150 mg/dL Final         Failed - CBC within normal limits and completed in the last 12 months    WBC  Date Value Ref Range Status  09/17/2020 5.2 3.8 - 10.8  Thousand/uL Final   RBC  Date Value Ref Range Status  09/17/2020 4.28 3.80 - 5.10 Million/uL Final   Hemoglobin  Date Value Ref Range Status  09/17/2020 12.8 11.7 - 15.5 g/dL Final  03/11/2019 12.9 11.1 - 15.9 g/dL Final   HCT  Date Value Ref Range Status  09/17/2020 38.6 35.0 - 45.0 % Final   Hematocrit  Date Value Ref Range Status  03/11/2019 39.8 34.0 - 46.6 % Final   MCHC  Date Value Ref Range Status  09/17/2020 33.2 32.0 - 36.0 g/dL Final   [facility]  Date Value Ref Range Status  09/17/2020 29.9 27.0 - 33.0 pg Final   MCV  Date Value Ref Range Status  09/17/2020 90.2 80.0 - 100.0 fL Final  03/11/2019 89 79 - 97 fL Final   No results found for: "PLTCOUNTKUC", "LABPLAT", "POCPLA" RDW  Date Value Ref Range Status  09/17/2020 12.7 11.0 - 15.0 % Final  03/11/2019 12.0 11.7 - 15.4 % Final         Failed - CMP within normal limits and completed in the last 12 months    Albumin  Date Value Ref Range Status  03/20/2020 4.2 3.5 - 5.0 g/dL Final  05/11/2019 3.8 3.8 - 4.8 g/dL  Final   Alkaline Phosphatase  Date Value Ref Range Status  03/20/2020 75 38 - 126 U/L Final   Alkaline phosphatase (APISO)  Date Value Ref Range Status  09/11/2021 70 37 - 153 U/L Final   ALT  Date Value Ref Range Status  09/11/2021 17 6 - 29 U/L Final   AST  Date Value Ref Range Status  09/11/2021 17 10 - 35 U/L Final   BUN  Date Value Ref Range Status  09/11/2021 20 7 - 25 mg/dL Final  08/30/2019 10 8 - 27 mg/dL Final   Calcium  Date Value Ref Range Status  09/11/2021 10.4 8.6 - 10.4 mg/dL Final   CO2  Date Value Ref Range Status  09/11/2021 30 20 - 32 mmol/L Final   Creat  Date Value Ref Range Status  09/11/2021 1.04 0.50 - 1.05 mg/dL Final   Creatinine, Urine  Date Value Ref Range Status  09/11/2021 41 20 - 275 mg/dL Final   Glucose  Date Value Ref Range Status  01/21/2017 109  Final   Glucose, Bld  Date Value Ref Range Status  09/11/2021 89 65 - 99 mg/dL Final     Comment:    .            Fasting reference interval .    Glucose-Capillary  Date Value Ref Range Status  05/21/2021 108 (H) 70 - 99 mg/dL Final    Comment:    Glucose reference range applies only to samples taken after fasting for at least 8 hours.   Potassium  Date Value Ref Range Status  09/11/2021 4.7 3.5 - 5.3 mmol/L Final   Sodium  Date Value Ref Range Status  09/11/2021 140 135 - 146 mmol/L Final  08/30/2019 143 134 - 144 mmol/L Final   Total Bilirubin  Date Value Ref Range Status  09/11/2021 0.7 0.2 - 1.2 mg/dL Final   Bilirubin Total  Date Value Ref Range Status  05/11/2019 0.3 0.0 - 1.2 mg/dL Final   Bilirubin, Direct  Date Value Ref Range Status  05/11/2019 0.14 0.00 - 0.40 mg/dL Final   Protein, ur  Date Value Ref Range Status  03/20/2020 NEGATIVE NEGATIVE mg/dL Final   Total Protein  Date Value Ref Range Status  09/11/2021 6.6 6.1 - 8.1 g/dL Final  05/11/2019 5.4 (L) 6.0 - 8.5 g/dL Final   GFR, Est African American  Date Value Ref Range Status  09/17/2020 92 > OR = 60 mL/min/1.12m Final   eGFR  Date Value Ref Range Status  09/11/2021 59 (L) > OR = 60 mL/min/1.762mFinal    Comment:    The eGFR is based on the CKD-EPI 2021 equation. To calculate  the new eGFR from a previous Creatinine or Cystatin C result, go to https://www.kidney.org/professionals/ kdoqi/gfr%5Fcalculator    GFR, Est Non African American  Date Value Ref Range Status  09/17/2020 79 > OR = 60 mL/min/1.7352minal   GFR, Estimated  Date Value Ref Range Status  05/20/2021 >60 >60 mL/min Final    Comment:    (NOTE) Calculated using the CKD-EPI Creatinine Equation (2021)          Passed - Completed PHQ-2 or PHQ-9 in the last 360 days      Passed - Last BP in normal range    BP Readings from Last 1 Encounters:  04/08/22 134/72         Passed - Last Heart Rate in normal range    Pulse Readings from Last 1 Encounters:  04/08/22 96         Passed - Valid encounter  within last 6 months    Recent Outpatient Visits           4 weeks ago Major depression, recurrent, chronic ([place])   [facility] [place], Drue Stager, MD   3 months ago Dyslipidemia associated with type 2 diabetes mellitus [facility])   [facility] Steele Sizer, MD   5 months ago Asthma-COPD overlap syndrome [facility])   [place], DO   6 months ago Subacute maxillary sinusitis   [facility] Teodora Medici, DO   7 months ago Upper respiratory tract infection, unspecified type   [facility] Teodora Medici, DO       Future Appointments             In 6 days Steele Sizer, MD [facility], [place]   In 2 months  [facility], [facility]

## 2022-05-07 NOTE — Telephone Encounter (Signed)
  Chief Complaint: crying episodes for "no reason" ran out of abilify. Pharmacy told patient she does not have further refills. Symptoms: crying at different times for no reason. Feels breathing heavier uses inhaler with relief. Frequency: 3 days , ran out of medication x 1 week  Pertinent Negatives: Patient denies chest pain no difficulty breathing. No thoughts of suicide Disposition: [] ED /[] Urgent Care (no appt availability in office) / [] Appointment(In office/virtual)/ []  [place] [facility]/ [] Home Care/ [] Refused Recommended Disposition /[] [place] [facility]/ [x]  Follow-up with PCP Additional Notes:   Please advise regarding refill on abilify    Reason for Disposition  [1] Depression AND [2] worsening (e.g., sleeping poorly, less able to do activities of daily living)  Answer Assessment - Initial Assessment Questions 1. CONCERN: "What happened that made you call today?"     Ran out of medication abilify x 1 week and crying for no reason x 3 days  2. DEPRESSION SYMPTOM SCREENING: "How are you feeling overall?" (e.g., decreased energy, increased sleeping or difficulty sleeping, difficulty concentrating, feelings of sadness, guilt, hopelessness, or worthlessness)     Off and on sleeping difficulties, crying episodes  3. RISK OF HARM - SUICIDAL IDEATION:  "Do you ever have thoughts of hurting or killing yourself?"  (e.g., yes, no, no but preoccupation with thoughts about death)   - INTENT:  "Do you have thoughts of hurting or killing yourself right NOW?" (e.g., yes, no, N/A)   - PLAN: "Do you have a specific plan for how you would do this?" (e.g., gun, knife, overdose, no plan, N/A)     No  4. RISK OF HARM - HOMICIDAL IDEATION:  "Do you ever have thoughts of hurting or killing someone else?"  (e.g., yes, no, no but preoccupation with thoughts about death)   - INTENT:  "Do you have thoughts of hurting or killing someone right NOW?" (e.g., yes, no, N/A)   - PLAN: "Do you have a  specific plan for how you would do this?" (e.g., gun, knife, no plan, N/A)      no 5. FUNCTIONAL IMPAIRMENT: "How have things been going for you overall? Have you had more difficulty than usual doing your normal daily activities?"  (e.g., better, same, worse; self-care, school, work, interactions)     Overall good while taking medication but ran out and pharmacy reported no further refills  6. SUPPORT: "Who is with you now?" "Who do you live with?" "Do you have family or friends who you can talk to?"      granddaughters 7. THERAPIST: "Do you have a counselor or therapist? Name?"     na 8. STRESSORS: "Has there been any new stress or recent changes in your life?"     Ran out of medication abilify  9. ALCOHOL USE OR SUBSTANCE USE (DRUG USE): "Do you drink alcohol or use any illegal drugs?"     na 10. OTHER: "Do you have any other physical symptoms right now?" (e.g., fever)       Breathing feels heavier and using inhaler  11. PREGNANCY: "Is there any chance you are pregnant?" "When was your last menstrual period?"       na  Protocols used: Depression-A-AH

## 2022-05-08 ENCOUNTER — Other Ambulatory Visit: Payer: Self-pay | Admitting: Family Medicine

## 2022-05-08 ENCOUNTER — Telehealth: Payer: Self-pay | Admitting: Family Medicine

## 2022-05-08 ENCOUNTER — Ambulatory Visit (INDEPENDENT_AMBULATORY_CARE_PROVIDER_SITE_OTHER): Payer: Medicare Other | Admitting: Internal Medicine

## 2022-05-08 ENCOUNTER — Encounter: Payer: Self-pay | Admitting: Internal Medicine

## 2022-05-08 VITALS — BP 126/74 | HR 98 | Temp 98.1°F | Ht <= 58 in | Wt 139.6 lb

## 2022-05-08 DIAGNOSIS — G4719 Other hypersomnia: Secondary | ICD-10-CM | POA: Diagnosis not present

## 2022-05-08 DIAGNOSIS — J449 Chronic obstructive pulmonary disease, unspecified: Secondary | ICD-10-CM

## 2022-05-08 DIAGNOSIS — F339 Major depressive disorder, recurrent, unspecified: Secondary | ICD-10-CM

## 2022-05-08 DIAGNOSIS — R0602 Shortness of breath: Secondary | ICD-10-CM

## 2022-05-08 MED ORDER — ARIPIPRAZOLE 2 MG PO TABS: 2.0000 mg | ORAL_TABLET | Freq: Every day | ORAL | 0 refills | Status: DC

## 2022-05-08 NOTE — Progress Notes (Signed)
Name: Sara Andrews MRN: [id number] DOB: 11-16-1955     CONSULTATION DATE: 05/08/2022  REFERRING MD : Sandie Ano  CHIEF COMPLAINT: chronic SOB and excessive daytime sleepiness   HISTORY OF PRESENT ILLNESS: 66 y.o. female with a hx of CAD (CCTA 09/2019-Ca score 185, 50% LCx, no stenosis), diabetes, hyperlipidemia, COPD, former smoker x30 years who presents with chronic SOB and excessive daytime sleepiness Diagnosed with COPD 2 years ago   Denies chest pain, states being very active.  Complains of difficulty sleeping, states snoring sometimes, has had a persistent cough which is nonproductive.      Echocardiogram 08/2019 normal systolic and diastolic function, EF 60%,  coronary CTA 09/2019 with calcium score 185, mild to moderate left circumflex stenosis, 50%, FFR CT no significant stenosis.   Patient is seen today for problems and issues with sleep related to excessive daytime sleepiness Patient  has been having sleep problems for many years Patient has been having excessive daytime sleepiness for a long time Patient has been having extreme fatigue and tiredness, lack of energy +  very Loud snoring every night + struggling breathe at night and gasps for air   Discussed sleep data and reviewed with patient.  Encouraged proper weight management.  Discussed driving precautions and its relationship with hypersomnolence.  Discussed operating dangerous equipment and its relationship with hypersomnolence.  Discussed sleep hygiene, and benefits of a fixed sleep waked time.  The importance of getting eight or more hours of sleep discussed with patient.  Discussed limiting the use of the computer and television before bedtime.  Decrease naps during the day, so night time sleep will become enhanced.  Limit caffeine, and sleep deprivation.  HTN, stroke, and heart failure are potential risk factors.    EPWORTH SLEEP SCORE 6    PAST MEDICAL HISTORY :   has a past medical history of  Anxiety, Arthritis, Asthma, Chronic bronchitis (HCC), COPD (chronic obstructive pulmonary disease) (HCC), Cough, Depression, Esophagitis, reflux, GERD (gastroesophageal reflux disease), Hyperlipidemia, Hypothyroidism, IBS (irritable bowel syndrome), Lumbago, Muscle pain, Osteoarthritis, Sinus disorder, Thyroid disease, Type 2 diabetes, diet controlled (HCC), and Uncomplicated herpes simplex.  has a past surgical history that includes Tonsillectomy and adenoidectomy; Carpal tunnel release (Right); Abdominal hysterectomy; Shoulder arthroscopy w/ rotator cuff repair (Right); ORIF tibia plateau (Left, 03/10/2016); Oophorectomy (Bilateral); Lumbar laminectomy (03/16/2018); and Shoulder arthroscopy with subacromial decompression, rotator cuff repair and bicep tendon repair (Left, 05/21/2021). Prior to Admission medications   Medication Sig Start Date End Date Taking? Authorizing Provider  albuterol (VENTOLIN HFA) 108 (90 Base) MCG/ACT inhaler INHALE 2 PUFFS INTO THE LUNGS EVERY 6 HOURS AS NEEDED FOR WHEEZING OR SHORTNESS OF BREATH 04/16/22   [name], FNP  ARIPiprazole (ABILIFY) 2 MG tablet Take 1 tablet (2 mg total) by mouth daily. 04/08/22   Alba Cory, MD  buPROPion (WELLBUTRIN XL) 150 MG 24 hr tablet TAKE 1 TABLET(150 MG) BY MOUTH DAILY 01/27/22   Alba Cory, MD  Cholecalciferol (VITAMIN D PO) Take 1,000 Units by mouth daily. 1000 IU    [provider]  DULoxetine (CYMBALTA) 60 MG capsule TAKE 1 CAPSULE(60 MG) BY MOUTH DAILY 01/27/22   Carlynn Purl, Danna Hefty, MD  fluticasone (FLONASE) 50 MCG/ACT nasal spray SHAKE LIQUID AND USE 2 SPRAYS IN EACH NOSTRIL DAILY Patient not taking: Reported on 04/08/2022 05/14/20   Alba Cory, MD  ipratropium-albuterol (DUONEB) 0.5-2.5 (3) MG/3ML SOLN Take 3 mLs by nebulization 3 (three) times daily as needed. For asthma/copd exacerbation symptoms (instead of albuterol neb) 10/31/20  Delsa Grana, PA-C  levothyroxine (SYNTHROID) 75 MCG tablet TAKE 1 TABLET(75  MCG) BY MOUTH DAILY 09/11/21   Steele Sizer, MD  lidocaine (XYLOCAINE) 5 % ointment Apply 1 application topically daily as needed (itching).    [provider]  loratadine (CLARITIN) 10 MG tablet TAKE 1 TABLET(10 MG) BY MOUTH AT BEDTIME 01/27/22   Steele Sizer, MD  Melatonin 10 MG TABS Take 10 mg by mouth at bedtime.    [provider]  montelukast (SINGULAIR) 10 MG tablet TAKE 1 TABLET(10 MG) BY MOUTH AT BEDTIME 02/24/22   Steele Sizer, MD  omeprazole (PRILOSEC) 40 MG capsule Take 40 mg by mouth daily as needed. 01/26/22   [provider]  Propylene Glycol (SYSTANE COMPLETE) 0.6 % SOLN Place 1 drop into both eyes daily as needed (dry eyes).    [provider]  rosuvastatin (CRESTOR) 20 MG tablet TAKE 1 TABLET(20 MG) BY MOUTH DAILY 01/27/22   Ancil Boozer, Drue Stager, MD  SUMAtriptan (IMITREX) 50 MG tablet TAKE 1 TABLET BY MOUTH. MAY REPEAT IN 2 HOURS IF HEADACHE PERSISTS OR REOCURS. DO NOT TAKE MORE THAN 2 IN 66 HOURS 01/27/22   Ancil Boozer, Drue Stager, MD  tiZANidine (ZANAFLEX) 2 MG tablet Take 1 tablet (2 mg total) by mouth daily as needed for muscle spasms. 01/10/22   Steele Sizer, MD  traZODone (DESYREL) 100 MG tablet Take 1 tablet (100 mg total) by mouth at bedtime. 01/10/22   Steele Sizer, MD  TRELEGY ELLIPTA 100-62.5-25 MCG/ACT AEPB INHALE 1 PUFF INTO THE LUNGS DAILY 02/24/22   Steele Sizer, MD  triamcinolone cream (KENALOG) 0.1 % Apply 1 Application topically 2 (two) times daily as needed. 04/08/22   Steele Sizer, MD  valACYclovir (VALTREX) 500 MG tablet TAKE 1 TABLET(500 MG) BY MOUTH DAILY 02/24/22   Steele Sizer, MD   No Known Allergies  FAMILY HISTORY:  family history includes Alcohol abuse in her sister; Anxiety disorder in her daughter and sister; Bipolar disorder in her sister; Breast cancer in her daughter; Breast cancer (age of onset: 15) in her maternal aunt and paternal aunt; Depression in her sister; Diabetes in her father; Heart attack in her  daughter; Heart disease in her father; Hypertension in her daughter; Pneumonia in her sister; Skin cancer in her daughter. SOCIAL HISTORY:  reports that she quit smoking about 13 years ago. Her smoking use included cigarettes. She has a 60.00 pack-year smoking history. She has never used smokeless tobacco. She reports that she does not drink alcohol and does not use drugs.   Review of Systems:  Gen:  Denies  fever, sweats, chills weight loss  HEENT: Denies blurred vision, double vision, ear pain, eye pain, hearing loss, nose bleeds, sore throat Cardiac:  No dizziness, chest pain or heaviness, chest tightness,edema, No JVD Resp:   + cough, -sputum production, +shortness of breath,-wheezing, -hemoptysis,  Gi: Denies swallowing difficulty, stomach pain, nausea or vomiting, diarrhea, constipation, bowel incontinence Gu:  Denies bladder incontinence, burning urine Ext:   Denies Joint pain, stiffness or swelling Skin: Denies  skin rash, easy bruising or bleeding or hives Endoc:  Denies polyuria, polydipsia , polyphagia or weight change Psych:   Denies depression, insomnia or hallucinations  Other:  All other systems negative   ALL OTHER ROS ARE NEGATIVE  BP 126/74 (BP Location: Left Arm, Cuff Size: Normal)   Pulse 98   Temp 98.1 F (36.7 C) (Temporal)   Ht 4' 9.5" (1.461 m)   Wt 139 lb 9.6 oz (63.3 kg)   SpO2  99%   BMI 29.69 kg/m     Physical Examination:   General Appearance: No distress  EYES PERRLA, EOM intact.   NECK Supple, No JVD Pulmonary: normal breath sounds, No wheezing.  CardiovascularNormal S1,S2.  No m/r/g.   Abdomen: Benign, Soft, non-tender. Skin:   warm, no rashes, no ecchymosis  Extremities: normal, no cyanosis, clubbing. Neuro:without focal findings,  speech normal  PSYCHIATRIC: Mood, affect within normal limits.   ALL OTHER ROS ARE NEGATIVE         IMAGING   CT CHEST LUNG CANCER SCREENING PROTOCOL 12/30/21-no acute findings, follow up 1  year  PFT's 2019 MODERATE OBSTRUCTIVE DISEASE FEV1 70%       ASSESSMENT AND PLAN SYNOPSIS  66 yo white female with previous DX of COPD chronic intermittent coughing diagnosed several years ago no signs and symptoms of exacerbation at this time, well-controlled on Trelegy inhaler therapy in the setting of chronic tobacco abuse also with associated excessive daytime sleepiness and insomnia with snoring with possible underlying sleep apnea   COPD previous FEV1 was 70% predicted We will obtain pulmonary function test for interval changes Continue inhaler therapy as prescribed Avoid allergens avoid secondhand smoke  Excessive daytime sleepiness Obtain sleep study  Follow-up lung cancer screening protocol  MEDICATION ADJUSTMENTS/LABS AND TESTS ORDERED: ASSESS FOR SLEEP APNEA OBTAIN SLEEP STUDY OBTAIN PFT's to assess for COPD changes Continue inhalers as prescribed Continue lung cancer screening protocol follow-up   CURRENT MEDICATIONS REVIEWED AT LENGTH WITH PATIENT TODAY   Patient  satisfied with Plan of action and management. All questions answered  Follow up 6 months  Total Time Spent  48 mins   Wallis Bamberg Santiago Glad, M.D.  [facility]  Medical Director [facility] Medical Director [facility] Cardio-Pulmonary Department

## 2022-05-08 NOTE — Telephone Encounter (Signed)
Copied from [place] [id number]. Topic: Referral - Question >> May 08, 2022  9:18 AM Erskine Squibb wrote: Reason for CRM: The referring practice for Dr Cephus Shelling called in stating they are not in network with the patients insurance and that the patient would new a new referral elsewhere with someone who does accept the patients insurance. Please assist patient further.

## 2022-05-08 NOTE — Telephone Encounter (Signed)
Called and gave Dr. Waylan Boga number. Printed last OV note and insurance and faxed to Dr. Waylan Boga office.

## 2022-05-08 NOTE — Patient Instructions (Addendum)
ASSESS FOR SLEEP APNEA OBTAIN SLEEP STUDY  OBTAIN PFT's to assess for COPD changes Continue inhalers as prescribed  Continue lung cancer screening protocol follow-up

## 2022-05-09 ENCOUNTER — Other Ambulatory Visit: Payer: Self-pay | Admitting: Family Medicine

## 2022-05-09 DIAGNOSIS — F339 Major depressive disorder, recurrent, unspecified: Secondary | ICD-10-CM

## 2022-05-12 ENCOUNTER — Telehealth: Payer: Self-pay | Admitting: Family Medicine

## 2022-05-12 NOTE — Telephone Encounter (Signed)
Referral Request - Has patient seen PCP for this complaint? yes *If NO, is insurance requiring patient see PCP for this issue before PCP can refer them? Referral for which specialty: psychiatry Preferred provider/office: ? Reason for referral: depression/previous referral that was put in for [facility]/she states can't accept patient because she doesn't meet the new patient criteria.

## 2022-05-12 NOTE — Progress Notes (Unsigned)
Name: Sara Andrews   MRN: [id number]    DOB: 1955-11-12   Date:05/13/2022       Progress Note  Subjective  Chief Complaint  Follow up   HPI  Major Depression:  no longer seeing Dr. Shea Evans but taking medication as prescribed - Duloxetine and Wellbutrin during the day and Trazodone at night, she feels anxious at night and keeps scratching her head ( she feels it is her nerves), we tried to switched from Trazodone to Seroquel but she kept taking both medication. She is feeling more down lately, she is crying " for no reason", she has been gaining weight and it upsets her, her oldest daughter has a drinking problem, she also gets jealous of her relationship with her youngest sister. I referred her back to psychiatrist, Dr. Shea Evans cannot see her, Dr. Nicolasa Ducking is not in network and one of the facilities only sees patients younger than 13. She states Abilify helped a little with mood but still depressed, she is about to re-enroll on medicare plans and will find out who she can see locally   Recurrent headaches: doing well on prn imitrex and zofran, she states not as frequent, she described pain as throbbing, photophobia, eye pain during episodes. Episodes once a month and stable, Takes medication prn    DDD and spondylolisthesis/Neck pain : doing well since surgery done 02/2018. She is off pain  medication, taking prn tylenol only and since pain is under control, she used to see Dr. Patrice Paradise, now seeing  Mia Creek at [facility] and will have MRI to evaluate numbness on both hands and weakness   Leg cramps: only at night, she has to stand up and walk around and it improves, she is taking magnesium. Discussed RLS and gave her Requip but caused pain to get worse and has been off medication  Symptoms have been stable lately   COPD/asthma/Emphysema  : she is back on Trelegy, she rinses her mouth after she uses it. She states cough is worse at night, denies wheezing or SOB lately  Recurrent scalp pruritis:  resolves since started taking Valtrex every night . Unchanged    GERD/dyspesia: she is no longer taking Ozempic, but states indigestion is worse, heartburn, associated with heartburn , nausea. Taking PPI and otc Tums or Rollaids but still has daily symptoms. She took 15 days of prednisone orally for her shoulder , last dose about 2 weeks ago , symptoms of GERD increased around the same time   Bilateral shoulder pain : she had rotator cuff repair right shoulder many years ago and was doing well, had left rotator cuff repair October 22, she had PT and after Reclast injection 01/05 and a couple of weeks later she developed right shoulder and arm pan and also increase in pain of left shoulder. She stopped taking NSAID's   Hypothyroidism: She is currently taking 75 mcg daily and half on Mondays, last TSH was at goal .Denies dysphagia   DM II : diagnosed at work back in 2012 she used to take medications  She denies polyphagia, polyuria , she has polydipsia.  Started on Trulicity June 0865, but she was having worsening of constipation and indigestion, so we switched to Ozempic 04/2017 weight was 198 lbs and went  down to 132.4  lbs. She was of Ozempic since January 21 and weight went up to 160 lbs, we stopped medication due to bloating and nausea, however she asked to go back on medication and has been able to tolerated  lower dose of Ozempic at 0.25 mg she stopped medication around 09/22 Weight has been stable one year ago weight was 138 lbs today is 141.8 lbs . A1C today was 5.5%.     Osteoporosis: she had first Reclast injection January 2023 and two weeks later developed bilateral shoulder pain , advised to follow up with Endo   Chest pain: intermittently and states not as often now,  she is under the care of  cardiologist. Dr. Garen Lah and had EKG and echo that showed normal EF . CT lung had already shown some coronary calcification. She is compliant with statin therapy  Last visit was July 2023    Senile purpura: reassurance given. Both arms and legs . Reassurance given    Atherosclerosis Aorta: found on CT done 03/20/2020, on statin therapy , no side effects of medication , last LDL was at goal Continue statin therapy   Patient Active Problem List   Diagnosis Date Noted   Spasms of the hands or feet 01/10/2022   Insomnia due to mental disorder 01/10/2022   Tendinitis of upper biceps tendon of left shoulder 05/22/2021   Rotator cuff tendinitis, left 05/22/2021   Nontraumatic incomplete tear of left rotator cuff 05/22/2021   History of shingles 03/23/2020   Burning sensation of lower extremity 03/22/2020   Centrilobular emphysema ([place]) 01/12/2020   Senile purpura ([place]) 01/12/2020   Major depression in remission ([place]) 01/12/2020   Dyslipidemia associated with type 2 diabetes mellitus ([place]) 01/12/2020   Bursitis of hip 08/23/2019   Carpal tunnel syndrome 08/23/2019   Thoracic aorta atherosclerosis ([place]) 08/03/2019   Spondylosis of lumbar spine 11/06/2016   Spinal stenosis of lumbar region with neurogenic claudication 10/28/2016   Chronic bilateral low back pain with bilateral sciatica 09/16/2016   Moderate episode of recurrent major depressive disorder ([place]) 08/07/2016   Pain of left heel 06/02/2016   Degenerative cervical disc 03/04/2016   Gastroesophageal reflux disease without esophagitis 02/01/2016   Primary osteoarthritis of right hand 02/01/2016   Paresthesias in left hand 02/01/2016   Asthma-COPD overlap syndrome 10/05/2013   History of smoking 10/05/2013   Hyperlipidemia 10/05/2013   Adult onset hypothyroidism 10/05/2013    Past Surgical History:  Procedure Laterality Date   ABDOMINAL HYSTERECTOMY     CARPAL TUNNEL RELEASE Right    LUMBAR LAMINECTOMY  03/16/2018   L4-5 Laminectomy & Fusion w/ Pedicle Screws, TLIF & Allograft; Surgeon: Alfredia Client, MD; Location: [facility] MAIN OR; Service: Orthopedics; Laterality: N/A; Prone, ProAxis, Stim Neuromonitoring,  O-Arm, Cell Saver, [place], [employer], Justin, PACS on [facility]     OOPHORECTOMY Bilateral    ORIF TIBIA PLATEAU Left 03/10/2016   Procedure: OPEN REDUCTION INTERNAL FIXATION (ORIF) BICONDYLAR TIBIAL PLATEAU FRACTURE;  Surgeon: Marybelle Killings, MD;  Location: [facility];  Service: Orthopedics;  Laterality: Left;   SHOULDER ARTHROSCOPY W/ ROTATOR CUFF REPAIR Right    SHOULDER ARTHROSCOPY WITH SUBACROMIAL DECOMPRESSION, ROTATOR CUFF REPAIR AND BICEP TENDON REPAIR Left 05/21/2021   Procedure: SHOULDER ARTHROSCOPY WITH DEBRIDEMENT, DECOMPRESSION, REPAIR OF PARTIAL THICKNESS ROTATOR CUFF REPAIR, BICEPS TENODESIS;  Surgeon: Corky Mull, MD;  Location: [facility] ORS;  Service: Orthopedics;  Laterality: Left;   TONSILLECTOMY AND ADENOIDECTOMY      Family History  Problem Relation Age of Onset   Diabetes Father    Heart disease Father    Depression Sister    Alcohol abuse Sister    Anxiety disorder Sister    Bipolar disorder Sister    Pneumonia Sister  Breast cancer Maternal Aunt 60   Breast cancer Paternal Aunt 24   Hypertension Daughter    Breast cancer Daughter    Skin cancer Daughter    Heart attack Daughter    Anxiety disorder Daughter     Social History   Tobacco Use   Smoking status: Former    Packs/day: 2.00    Years: 30.00    Total pack years: 60.00    Types: Cigarettes    Quit date: 09/03/2008    Years since quitting: 13.6   Smokeless tobacco: Never  Substance Use Topics   Alcohol use: No     Current Outpatient Medications:    albuterol (VENTOLIN HFA) 108 (90 Base) MCG/ACT inhaler, INHALE 2 PUFFS INTO THE LUNGS EVERY 6 HOURS AS NEEDED FOR WHEEZING OR SHORTNESS OF BREATH, Disp: 6.7 g, Rfl: 0   buPROPion (WELLBUTRIN XL) 150 MG 24 hr tablet, TAKE 1 TABLET(150 MG) BY MOUTH DAILY, Disp: 90 tablet, Rfl: 1   Cholecalciferol (VITAMIN D PO), Take 1,000 Units by mouth daily. 1000 IU, Disp: , Rfl:    DULoxetine (CYMBALTA) 60 MG capsule, TAKE 1 CAPSULE(60 MG) BY MOUTH DAILY, Disp: 90 capsule,  Rfl: 1   etodolac (LODINE) 500 MG tablet, Take 500 mg by mouth 2 (two) times daily., Disp: , Rfl:    fluticasone (FLONASE) 50 MCG/ACT nasal spray, SHAKE LIQUID AND USE 2 SPRAYS IN EACH NOSTRIL DAILY, Disp: 48 g, Rfl: 0   ipratropium-albuterol (DUONEB) 0.5-2.5 (3) MG/3ML SOLN, Take 3 mLs by nebulization 3 (three) times daily as needed. For asthma/copd exacerbation symptoms (instead of albuterol neb), Disp: 180 mL, Rfl: 1   levothyroxine (SYNTHROID) 75 MCG tablet, TAKE 1 TABLET(75 MCG) BY MOUTH DAILY, Disp: 90 tablet, Rfl: 3   lidocaine (XYLOCAINE) 5 % ointment, Apply 1 application topically daily as needed (itching)., Disp: , Rfl:    loratadine (CLARITIN) 10 MG tablet, TAKE 1 TABLET(10 MG) BY MOUTH AT BEDTIME, Disp: 90 tablet, Rfl: 1   Melatonin 10 MG TABS, Take 10 mg by mouth at bedtime., Disp: , Rfl:    montelukast (SINGULAIR) 10 MG tablet, TAKE 1 TABLET(10 MG) BY MOUTH AT BEDTIME, Disp: 90 tablet, Rfl: 1   Propylene Glycol (SYSTANE COMPLETE) 0.6 % SOLN, Place 1 drop into both eyes daily as needed (dry eyes)., Disp: , Rfl:    rosuvastatin (CRESTOR) 20 MG tablet, TAKE 1 TABLET(20 MG) BY MOUTH DAILY, Disp: 90 tablet, Rfl: 1   SUMAtriptan (IMITREX) 50 MG tablet, TAKE 1 TABLET BY MOUTH. MAY REPEAT IN 2 HOURS IF HEADACHE PERSISTS OR REOCURS. DO NOT TAKE MORE THAN 2 IN 24 HOURS, Disp: 10 tablet, Rfl: 0   tiZANidine (ZANAFLEX) 4 MG tablet, Take 4 mg by mouth 2 (two) times daily., Disp: , Rfl:    TRELEGY ELLIPTA 100-62.5-25 MCG/ACT AEPB, INHALE 1 PUFF INTO THE LUNGS DAILY, Disp: 180 each, Rfl: 1   triamcinolone cream (KENALOG) 0.1 %, Apply 1 Application topically 2 (two) times daily as needed., Disp: 80 g, Rfl: 0   valACYclovir (VALTREX) 500 MG tablet, TAKE 1 TABLET(500 MG) BY MOUTH DAILY, Disp: 90 tablet, Rfl: 1   ARIPiprazole (ABILIFY) 2 MG tablet, Take 1 tablet (2 mg total) by mouth daily., Disp: 90 tablet, Rfl: 0   omeprazole (PRILOSEC) 40 MG capsule, Take 1 capsule (40 mg total) by mouth 2 (two) times  daily., Disp: 60 capsule, Rfl: 0   traZODone (DESYREL) 100 MG tablet, Take 1 tablet (100 mg total) by mouth at bedtime., Disp: 90 tablet, Rfl:  0  No Known Allergies  I personally reviewed active problem list, medication list, allergies, family history with the patient/caregiver today.   ROS  Ten systems reviewed and is negative except as mentioned in HPI   Objective  Vitals:   05/13/22 1356  BP: 120/82  Pulse: 98  Resp: 14  Temp: 97.7 F (36.5 C)  TempSrc: Oral  SpO2: 98%  Weight: 141 lb 12.8 oz (64.3 kg)  Height: 4\' 9"  (1.448 m)    Body mass index is 30.69 kg/m.  Physical Exam  Constitutional: Patient appears well-developed and well-nourished. Obese No distress.  HEENT: head atraumatic, normocephalic, pupils equal and reactive to light, neck supple Cardiovascular: Normal rate, regular rhythm and normal heart sounds.  No murmur heard. No BLE edema. Pulmonary/Chest: Effort normal and breath sounds normal. No respiratory distress. Abdominal: Soft.  There is no tenderness. Psychiatric: Patient has a depressed mood   Recent Results (from the past 2160 hour(s))  POCT HgB A1C     Status: Abnormal   Collection Time: 05/13/22  2:00 PM  Result Value Ref Range   Hemoglobin A1C 5.5 4.0 - 5.6 %   HbA1c POC (<> result, manual entry)     HbA1c, POC (prediabetic range)     HbA1c, POC (controlled diabetic range)        PHQ2/9:    05/13/2022    2:03 PM 04/08/2022    8:45 AM 01/10/2022    1:18 PM 11/11/2021   11:05 AM 10/28/2021    2:09 PM  Depression screen PHQ 2/9  Decreased Interest 2 2 0 0 0  Down, Depressed, Hopeless 3 3 0 0 0  PHQ - 2 Score 5 5 0 0 0  Altered sleeping 3 3 3   0  Tired, decreased energy 2 1 1   0  Change in appetite 0 0 0  0  Feeling bad or failure about yourself  2 0 0  0  Trouble concentrating 0 0 0  0  Moving slowly or fidgety/restless 0 0 0  0  Suicidal thoughts 0 0 0  0  PHQ-9 Score 12 9 4   0  Difficult doing work/chores Very difficult  Not  difficult at all  Not difficult at all    phq 9 is positive on medication, waiting for appointment with psychiatrist   Fall Risk:    05/13/2022    1:58 PM 04/08/2022    8:40 AM 01/10/2022    1:17 PM 11/11/2021   11:05 AM 10/28/2021    2:09 PM  Fall Risk   Falls in the past year? 0 0 0 0 0  Number falls in past yr:  0  0 0  Injury with Fall?  0  0 0  Risk for fall due to : No Fall Risks No Fall Risks No Fall Risks    Follow up Falls prevention discussed;Education provided;Falls evaluation completed Falls prevention discussed Falls prevention discussed Falls evaluation completed     Assessment & Plan  1. Dyslipidemia associated with type 2 diabetes mellitus (HCC)  - POCT HgB A1C  2. Need for immunization against influenza  - Flu Vaccine QUAD 4mo+IM (Fluarix, Fluzone & Alfiuria Quad PF)  3. Major depression, recurrent, chronic (HCC)  - ARIPiprazole (ABILIFY) 2 MG tablet; Take 1 tablet (2 mg total) by mouth daily.  Dispense: 90 tablet; Refill: 0  4. Asthma with chronic obstructive pulmonary disease (COPD)  Seen by Dr. Mortimer Fries   5. Thoracic aorta atherosclerosis ([place])  On statin therapy  6. Centrilobular emphysema ([place])  On CT and taking medication , keep follow up with pulmonologist   7. Senile purpura (HCC)  Stable and reassurance given   8. Migraine without aura and without status migrainosus, not intractable   9. Gastroesophageal reflux disease without esophagitis  - omeprazole (PRILOSEC) 40 MG capsule; Take 1 capsule (40 mg total) by mouth 2 (two) times daily.  Dispense: 60 capsule; Refill: 0 - Ambulatory referral to Gastroenterology  10. RLS (restless legs syndrome)   11. Insomnia due to mental disorder  - traZODone (DESYREL) 100 MG tablet; Take 1 tablet (100 mg total) by mouth at bedtime.  Dispense: 90 tablet; Refill: 0  12. Age-related osteoporosis without current pathological fracture   13. Cervical radiculopathy  Seeing Ortho, looks like she was  given rx of Lodine a few days ago, previously on prednisone for 15 days, explained it may be the cause of her stomach pain, needs to stop NSAID's and take PPI bid, take tylenol for pain and contact ortho

## 2022-05-13 ENCOUNTER — Telehealth: Payer: Self-pay | Admitting: Family Medicine

## 2022-05-13 ENCOUNTER — Encounter: Payer: Self-pay | Admitting: Family Medicine

## 2022-05-13 ENCOUNTER — Ambulatory Visit (INDEPENDENT_AMBULATORY_CARE_PROVIDER_SITE_OTHER): Payer: Medicare Other | Admitting: Family Medicine

## 2022-05-13 VITALS — BP 120/82 | HR 98 | Temp 97.7°F | Resp 14 | Ht <= 58 in | Wt 141.8 lb

## 2022-05-13 DIAGNOSIS — Z23 Encounter for immunization: Secondary | ICD-10-CM | POA: Diagnosis not present

## 2022-05-13 DIAGNOSIS — E1169 Type 2 diabetes mellitus with other specified complication: Secondary | ICD-10-CM

## 2022-05-13 DIAGNOSIS — G43009 Migraine without aura, not intractable, without status migrainosus: Secondary | ICD-10-CM

## 2022-05-13 DIAGNOSIS — J4489 Other specified chronic obstructive pulmonary disease: Secondary | ICD-10-CM | POA: Diagnosis not present

## 2022-05-13 DIAGNOSIS — E785 Hyperlipidemia, unspecified: Secondary | ICD-10-CM

## 2022-05-13 DIAGNOSIS — F339 Major depressive disorder, recurrent, unspecified: Secondary | ICD-10-CM | POA: Diagnosis not present

## 2022-05-13 DIAGNOSIS — I700 Atherosclerosis of aorta: Secondary | ICD-10-CM

## 2022-05-13 DIAGNOSIS — D692 Other nonthrombocytopenic purpura: Secondary | ICD-10-CM

## 2022-05-13 DIAGNOSIS — G2581 Restless legs syndrome: Secondary | ICD-10-CM

## 2022-05-13 DIAGNOSIS — K219 Gastro-esophageal reflux disease without esophagitis: Secondary | ICD-10-CM

## 2022-05-13 DIAGNOSIS — M810 Age-related osteoporosis without current pathological fracture: Secondary | ICD-10-CM

## 2022-05-13 DIAGNOSIS — M5412 Radiculopathy, cervical region: Secondary | ICD-10-CM

## 2022-05-13 DIAGNOSIS — F5105 Insomnia due to other mental disorder: Secondary | ICD-10-CM

## 2022-05-13 DIAGNOSIS — J432 Centrilobular emphysema: Secondary | ICD-10-CM

## 2022-05-13 LAB — POCT GLYCOSYLATED HEMOGLOBIN (HGB A1C): Hemoglobin A1C: 5.5 % (ref 4.0–5.6)

## 2022-05-13 MED ORDER — OMEPRAZOLE 40 MG PO CPDR: 40.0000 mg | DELAYED_RELEASE_CAPSULE | Freq: Two times a day (BID) | ORAL | 0 refills | Status: DC

## 2022-05-13 MED ORDER — TRAZODONE HCL 100 MG PO TABS: 100.0000 mg | ORAL_TABLET | Freq: Every day | ORAL | 0 refills | Status: DC

## 2022-05-13 MED ORDER — ARIPIPRAZOLE 2 MG PO TABS: 2.0000 mg | ORAL_TABLET | Freq: Every day | ORAL | 0 refills | Status: DC

## 2022-05-13 NOTE — Patient Instructions (Signed)
Dr. Nicolasa Ducking

## 2022-05-16 ENCOUNTER — Other Ambulatory Visit: Payer: Self-pay | Admitting: Family Medicine

## 2022-05-16 DIAGNOSIS — F5105 Insomnia due to other mental disorder: Secondary | ICD-10-CM

## 2022-05-22 ENCOUNTER — Ambulatory Visit
Admission: RE | Admit: 2022-05-22 | Discharge: 2022-05-22 | Disposition: A | Discharge status: Home / Self Care | Payer: Medicare Other | Source: Ambulatory Visit | Attending: Student | Admitting: Student

## 2022-05-22 DIAGNOSIS — M5412 Radiculopathy, cervical region: Secondary | ICD-10-CM

## 2022-05-22 DIAGNOSIS — M47812 Spondylosis without myelopathy or radiculopathy, cervical region: Secondary | ICD-10-CM

## 2022-05-23 ENCOUNTER — Other Ambulatory Visit: Payer: Self-pay | Admitting: Family Medicine

## 2022-05-23 DIAGNOSIS — F339 Major depressive disorder, recurrent, unspecified: Secondary | ICD-10-CM

## 2022-05-23 NOTE — Telephone Encounter (Signed)
Copied from [place] [id number]. Topic: General - Other >> May 23, 2022 10:13 AM Cyndi Bender wrote: Reason for CRM: Pt reports that she can not see the psychiatrist until January when her new insurance starts. Pt would like to ask Dr. Ancil Boozer to please refill her Rx for ARIPiprazole (ABILIFY) 2 MG tablet until she can be seen by the psychiatrist. Cb# [phone]

## 2022-05-23 NOTE — Telephone Encounter (Signed)
No answer from pt left vm to feel free to pick up rx at pharmacy

## 2022-06-13 ENCOUNTER — Ambulatory Visit: Payer: Medicare Other

## 2022-06-13 DIAGNOSIS — G4719 Other hypersomnia: Secondary | ICD-10-CM

## 2022-06-13 DIAGNOSIS — G4733 Obstructive sleep apnea (adult) (pediatric): Secondary | ICD-10-CM

## 2022-06-22 ENCOUNTER — Other Ambulatory Visit: Payer: Self-pay | Admitting: Family Medicine

## 2022-06-22 DIAGNOSIS — G43009 Migraine without aura, not intractable, without status migrainosus: Secondary | ICD-10-CM

## 2022-06-22 DIAGNOSIS — F5105 Insomnia due to other mental disorder: Secondary | ICD-10-CM

## 2022-06-23 DIAGNOSIS — G4733 Obstructive sleep apnea (adult) (pediatric): Secondary | ICD-10-CM | POA: Diagnosis not present

## 2022-07-01 ENCOUNTER — Other Ambulatory Visit: Payer: Self-pay | Admitting: Family Medicine

## 2022-07-01 DIAGNOSIS — J432 Centrilobular emphysema: Secondary | ICD-10-CM

## 2022-07-01 MED ORDER — ALBUTEROL SULFATE HFA 108 (90 BASE) MCG/ACT IN AERS: 2.0000 | INHALATION_SPRAY | Freq: Four times a day (QID) | RESPIRATORY_TRACT | 2 refills | Status: DC | PRN

## 2022-07-01 NOTE — Telephone Encounter (Signed)
Medication Refill - Medication: albuterol (VENTOLIN HFA) 108 (90 Base) MCG/ACT inhaler   Has the patient contacted their pharmacy? Yes.     Preferred Pharmacy (with phone number or street name):  Has the patient been seen for an appointment in the last yea [id number] Cheree Ditto, [place] - [address] AT [facility] [address] [facility] Phone: [phone]  Fax: [phone]    r OR does the patient have an upcoming appointment? Yes.     The patient is out of her inhaler and needs as soon as possible. Pleas assist patient further

## 2022-07-01 NOTE — Telephone Encounter (Signed)
Requested Prescriptions  Pending Prescriptions Disp Refills   albuterol (VENTOLIN HFA) 108 (90 Base) MCG/ACT inhaler 6.7 g 2    Sig: Inhale 2 puffs into the lungs every 6 (six) hours as needed for wheezing or shortness of breath.     Pulmonology:  Beta Agonists 2 Passed - 07/01/2022  4:11 PM      Passed - Last BP in normal range    BP Readings from Last 1 Encounters:  05/13/22 120/82         Passed - Last Heart Rate in normal range    Pulse Readings from Last 1 Encounters:  05/13/22 98         Passed - Valid encounter within last 12 months    Recent Outpatient Visits           1 month ago Dyslipidemia associated with type 2 diabetes mellitus [facility])   [facility] Alba Cory, MD   2 months ago Major depression, recurrent, chronic [facility])   [facility] [place], Danna Hefty, MD   5 months ago Dyslipidemia associated with type 2 diabetes mellitus [facility])   [facility] Alba Cory, MD   7 months ago Asthma-COPD overlap syndrome [facility])   [facility] [place] Dense M, DO   8 months ago Subacute maxillary sinusitis   [facility] Margarita Mail, DO       Future Appointments             In 2 weeks  [facility]   In 1 month Alba Cory, MD [facility], [facility]

## 2022-07-03 ENCOUNTER — Telehealth: Payer: Self-pay

## 2022-07-03 NOTE — Telephone Encounter (Signed)
Patient lvm stated that she had an appt with [place] for next week.  I did not see where she had an appt for next week with our office but she does have a Medicare Wellness visit scheduled for Thursday 07/17/22 at 8:40am.  Her new patient app with Dr. Allegra Lai is 10/01/22 at 3:15pm.  LVM for pt to  call if she has any other questions.  Thanks,  [place], [place]

## 2022-07-05 ENCOUNTER — Other Ambulatory Visit: Payer: Self-pay | Admitting: Family Medicine

## 2022-07-16 ENCOUNTER — Telehealth: Payer: Self-pay

## 2022-07-16 NOTE — Telephone Encounter (Signed)
Copied from CRM [id number]. Topic: General - Other >> Jul 16, 2022 10:06 AM Lyman Speller wrote: Reason for CRM: Pt had a sleep study in November and hasn't heard about the results/ pt was advised to contact pcp to go over results / please advise    Scanned in document under procedures for home sleep study. CPAP recommended.

## 2022-07-17 ENCOUNTER — Ambulatory Visit (INDEPENDENT_AMBULATORY_CARE_PROVIDER_SITE_OTHER): Payer: Medicare Other

## 2022-07-17 ENCOUNTER — Ambulatory Visit (INDEPENDENT_AMBULATORY_CARE_PROVIDER_SITE_OTHER): Payer: Medicare Other | Admitting: Family Medicine

## 2022-07-17 ENCOUNTER — Encounter: Payer: Self-pay | Admitting: Family Medicine

## 2022-07-17 VITALS — BP 118/72 | HR 98 | Temp 98.3°F | Resp 16 | Ht 59.0 in | Wt 147.0 lb

## 2022-07-17 VITALS — BP 118/72 | HR 98 | Temp 98.3°F | Resp 18 | Ht 59.5 in | Wt 147.8 lb

## 2022-07-17 DIAGNOSIS — Z9181 History of falling: Secondary | ICD-10-CM | POA: Diagnosis not present

## 2022-07-17 DIAGNOSIS — Z0000 Encounter for general adult medical examination without abnormal findings: Secondary | ICD-10-CM | POA: Diagnosis not present

## 2022-07-17 DIAGNOSIS — S91301A Unspecified open wound, right foot, initial encounter: Secondary | ICD-10-CM | POA: Diagnosis not present

## 2022-07-17 NOTE — Patient Instructions (Signed)

## 2022-07-17 NOTE — Progress Notes (Signed)
Subjective:   Sara Andrews is a 66 y.o. female who presents for Medicare Annual (Subsequent) preventive examination.  Review of Systems    Per HPI unless specifically indicated below.  Cardiac Risk Factors include: advanced age (>75mn, >>57women);female gender, and hyperlipidemia.           Objective:    Today's Vitals   07/17/22 0850 07/17/22 0859  BP: 118/72   Pulse: 98   Resp: 18   Temp: 98.3 F (36.8 C)   TempSrc: Oral   SpO2: 95%   Weight: 147 lb 12.8 oz (67 kg)   Height: 4' 11.5" (1.511 m)   PainSc:  6    Body mass index is 29.35 kg/m.     07/17/2022    3:15 PM 07/16/2021    9:06 AM 05/21/2021   11:19 AM 05/20/2021    9:35 AM 07/12/2020    9:16 AM 03/20/2020    6:50 PM 05/18/2017    9:11 AM  Advanced Directives  Does Patient Have a Medical Advance Directive? _0  No   Would patient like information on creating a medical advance directive? No - Patient declined Yes (MAU/Ambulatory/Procedural Areas - Information given) No - Patient declined No - Patient declined Yes (MAU/Ambulatory/Procedural Areas - Information given)       Information is confidential and restricted. Go to Review Flowsheets to unlock data.    Current Medications (verified) Outpatient Encounter Medications as of 07/17/2022  Medication Sig   albuterol (VENTOLIN HFA) 108 (90 Base) MCG/ACT inhaler Inhale 2 puffs into the lungs every 6 (six) hours as needed for wheezing or shortness of breath.   ARIPiprazole (ABILIFY) 2 MG tablet Take 1 tablet (2 mg total) by mouth daily.   buPROPion (WELLBUTRIN XL) 150 MG 24 hr tablet TAKE 1 TABLET(150 MG) BY MOUTH DAILY   calcium carbonate (OSCAL) 1500 (600 Ca) MG TABS tablet Take 1,500 mg by mouth 2 (two) times daily with a meal.   Cholecalciferol (VITAMIN D PO) Take 1,000 Units by mouth daily. 1000 IU   DULoxetine (CYMBALTA) 60 MG capsule TAKE 1 CAPSULE(60 MG) BY MOUTH DAILY   etodolac (LODINE) 500 MG tablet Take 500 mg by mouth 2 (two)  times daily.   fluticasone (FLONASE) 50 MCG/ACT nasal spray SHAKE LIQUID AND USE 2 SPRAYS IN EACH NOSTRIL DAILY   ipratropium-albuterol (DUONEB) 0.5-2.5 (3) MG/3ML SOLN Take 3 mLs by nebulization 3 (three) times daily as needed. For asthma/copd exacerbation symptoms (instead of albuterol neb)   levothyroxine (SYNTHROID) 75 MCG tablet TAKE 1 TABLET(75 MCG) BY MOUTH DAILY   lidocaine (XYLOCAINE) 5 % ointment Apply 1 application topically daily as needed (itching).   loratadine (CLARITIN) 10 MG tablet TAKE 1 TABLET(10 MG) BY MOUTH AT BEDTIME   Melatonin 10 MG TABS Take 10 mg by mouth at bedtime.   montelukast (SINGULAIR) 10 MG tablet TAKE 1 TABLET(10 MG) BY MOUTH AT BEDTIME   omeprazole (PRILOSEC) 40 MG capsule Take 1 capsule (40 mg total) by mouth 2 (two) times daily.   Propylene Glycol (SYSTANE COMPLETE) 0.6 % SOLN Place 1 drop into both eyes daily as needed (dry eyes).   rosuvastatin (CRESTOR) 20 MG tablet TAKE 1 TABLET(20 MG) BY MOUTH DAILY   SUMAtriptan (IMITREX) 50 MG tablet TAKE 1 TABLET BY MOUTH ON ONSET OF HEADACHE. MAY REPEAT IN 2 HOURS IF HEADACHE PERSISTS OR REOCURS. MAX 2 TABLETS IN 24 HOURS   tiZANidine (ZANAFLEX) 4 MG tablet Take 4 mg by mouth 2 (two)  times daily.   traZODone (DESYREL) 100 MG tablet TAKE 1 TABLET(100 MG) BY MOUTH AT BEDTIME   TRELEGY ELLIPTA 100-62.5-25 MCG/ACT AEPB INHALE 1 PUFF INTO THE LUNGS DAILY   triamcinolone cream (KENALOG) 0.1 % Apply 1 Application topically 2 (two) times daily as needed.   valACYclovir (VALTREX) 500 MG tablet TAKE 1 TABLET(500 MG) BY MOUTH DAILY   No facility-administered encounter medications on file as of 07/17/2022.    Allergies (verified) Patient has no known allergies.   History: Past Medical History:  Diagnosis Date   Anxiety    Arthritis    "hips, hands, back" (03/10/2016)   Asthma    Chronic bronchitis (HCC)    COPD (chronic obstructive pulmonary disease) (HCC)    Cough    Depression    Esophagitis, reflux    GERD  (gastroesophageal reflux disease)    Hyperlipidemia    Hypothyroidism    IBS (irritable bowel syndrome)    Lumbago    Muscle pain    Osteoarthritis    Sinus disorder    Thyroid disease    Type 2 diabetes, diet controlled ([place])    Uncomplicated herpes simplex    Past Surgical History:  Procedure Laterality Date   ABDOMINAL HYSTERECTOMY     CARPAL TUNNEL RELEASE Right    LUMBAR LAMINECTOMY  03/16/2018   L4-5 Laminectomy & Fusion w/ Pedicle Screws, TLIF & Allograft; Surgeon: Alfredia Client, MD; Location: [facility] MAIN OR; Service: Orthopedics; Laterality: N/A; Prone, ProAxis, Stim Neuromonitoring, O-Arm, Cell Saver, [place], [employer], Justin, PACS on [facility]     OOPHORECTOMY Bilateral    ORIF TIBIA PLATEAU Left 03/10/2016   Procedure: OPEN REDUCTION INTERNAL FIXATION (ORIF) BICONDYLAR TIBIAL PLATEAU FRACTURE;  Surgeon: Marybelle Killings, MD;  Location: [place] [facility];  Service: Orthopedics;  Laterality: Left;   SHOULDER ARTHROSCOPY W/ ROTATOR CUFF REPAIR Right    SHOULDER ARTHROSCOPY WITH SUBACROMIAL DECOMPRESSION, ROTATOR CUFF REPAIR AND BICEP TENDON REPAIR Left 05/21/2021   Procedure: SHOULDER ARTHROSCOPY WITH DEBRIDEMENT, DECOMPRESSION, REPAIR OF PARTIAL THICKNESS ROTATOR CUFF REPAIR, BICEPS TENODESIS;  Surgeon: Corky Mull, MD;  Location: [facility] ORS;  Service: Orthopedics;  Laterality: Left;   TONSILLECTOMY AND ADENOIDECTOMY     Family History  Problem Relation Age of Onset   Diabetes Father    Heart disease Father    Depression Sister    Alcohol abuse Sister    Anxiety disorder Sister    Bipolar disorder Sister    Pneumonia Sister    Breast cancer Maternal Aunt 107   Breast cancer Paternal Aunt 17   Hypertension Daughter    Breast cancer Daughter    Skin cancer Daughter    Heart attack Daughter    Anxiety disorder Daughter    Social History   Socioeconomic History   Marital status: Widowed    Spouse name: Not on file   Number of children: 3   Years of education: Not on file    Highest education level: 10th grade  Occupational History   Occupation: disabled    Comment: retired  Tobacco Use   Smoking status: Former    Packs/day: 2.00    Years: 30.00    Total pack years: 60.00    Types: Cigarettes    Quit date: 09/03/2008    Years since quitting: 13.8   Smokeless tobacco: Never  Vaping Use   Vaping Use: Never used  Substance and Sexual Activity   Alcohol use: No   Drug use: No   Sexual activity: Never  Other  Topics Concern   Not on file  Social History Narrative   She used to work at  [employer], but had a left knee work related injury 03/10/2016 , require left knee surgery and is now having back pain, that is getting evaluated, Out of work since.       Patient recently found out that one of her twins [name]) was diagnosed with breast cancer); will be having a double mastectomy soon.      Pt lives alone   Social Determinants of Health   Financial Resource Strain: Low Risk  (07/17/2022)   Overall Financial Resource Strain (CARDIA)    Difficulty of Paying Living Expenses: Not hard at all  Food Insecurity: No Food Insecurity (07/17/2022)   Hunger Vital Sign    Worried About Running Out of Food in the Last Year: Never true    Ran Out of Food in the Last Year: Never true  Transportation Needs: No Transportation Needs (07/17/2022)   PRAPARE - Hydrologist (Medical): No    Lack of Transportation (Non-Medical): No  Physical Activity: Inactive (07/17/2022)   Exercise Vital Sign    Days of Exercise per Week: 0 days    Minutes of Exercise per Session: 0 min  Stress: No Stress Concern Present (07/17/2022)   [place]    Feeling of Stress : Only a little  Social Connections: Socially Isolated (07/17/2022)   Social Connection and Isolation Panel [NHANES]    Frequency of Communication with Friends and Family: More than three times a week    Frequency of Social  Gatherings with Friends and Family: Never    Attends Religious Services: Never    Marine scientist or Organizations: No    Attends Archivist Meetings: Never    Marital Status: Widowed    Tobacco Counseling Counseling given: Not Answered   Clinical Intake:  Pre-visit preparation completed: No  Pain : 0-10 Pain Score: 6  Pain Type: Chronic pain Pain Location: Shoulder Pain Orientation: Right Pain Descriptors / Indicators: Sharp Pain Frequency: Intermittent     Nutritional Status: BMI 25 -29 Overweight Nutritional Risks: Unintentional weight gain Diabetes: Yes CBG done?: No Did pt. bring in CBG monitor from home?: No  How often do you need to have someone help you when you read instructions, pamphlets, or other written materials from your doctor or pharmacy?: 1 - Never  Diabetic?Nutrition Risk Assessment:  Has the patient had any N/V/D within the last 2 months?  No  Does the patient have any non-healing wounds?  Yes  Has the patient had any unintentional weight loss or weight gain?  Yes   Diabetes:  Is the patient diabetic?  Yes  If diabetic, was a CBG obtained today?  No  Did the patient bring in their glucometer from home?  No  How often do you monitor your CBG's? never.   Financial Strains and Diabetes Management:  Are you having any financial strains with the device, your supplies or your medication? No .  Does the patient want to be seen by Chronic Care Management for management of their diabetes?  No  Would the patient like to be referred to a Nutritionist or for Diabetic Management?  No   Diabetic Exams:  Diabetic Eye Exam: Overdue for diabetic eye exam. Pt has been advised about the importance in completing this exam. Patient advised to call and schedule an eye exam. Diabetic Foot Exam: Completed  01/10/2022 Interpreter Needed?: No  Information entered by :: Donnie [place], [place]   Activities of Daily Living    07/17/2022    9:28 AM  07/17/2022    8:54 AM  In your present state of health, do you have any difficulty performing the following activities:  Hearing? 0 0  Vision? 1 1  Difficulty concentrating or making decisions? 0 0  Walking or climbing stairs? 1 1  Dressing or bathing? 0 0  Doing errands, shopping? 0 0    Patient Care Team: Steele Sizer, MD as PCP - General (Family Medicine) Lonia Farber, MD as Consulting Physician (Endocrinology) Poggi, Marshall Cork, MD as Consulting Physician (Orthopedic Surgery) Lattie Corns, PA-C as Physician Assistant (Physician Assistant)  Indicate any recent Medical Services you may have received from other than [facility] providers in the past year (date may be approximate).     Assessment:   This is a routine wellness examination for Babette.  Hearing/Vision screen Denies any changes in her hearing. Denies any vision changes, wear glasses. Annual Eye Exam.   Dietary issues and exercise activities discussed: Current Exercise Habits: The patient does not participate in regular exercise at present, Exercise limited by: None identified   Goals Addressed   None    Depression Screen    07/17/2022    9:28 AM 07/17/2022    8:53 AM 05/13/2022    2:03 PM 04/08/2022    8:45 AM 01/10/2022    1:18 PM 11/11/2021   11:05 AM 10/28/2021    2:09 PM  PHQ 2/9 Scores  PHQ - 2 Score 0 0 5 5 0 0 0  PHQ- 9 Score   _0 0    Fall Risk    07/17/2022    9:28 AM 07/17/2022    8:54 AM 05/13/2022    1:58 PM 04/08/2022    8:40 AM 01/10/2022    1:17 PM  Fall Risk   Falls in the past year? 0 0 0 0 0  Number falls in past yr: 0 0  0   Injury with Fall? 0 0  0   Risk for fall due to : _1   Follow up Falls prevention discussed Falls evaluation completed Falls prevention discussed;Education provided;Falls evaluation completed Falls prevention discussed Falls prevention discussed    FALL RISK PREVENTION PERTAINING  TO THE HOME:  Any stairs in or around the home? No  If so, are there any without handrails? No  Home free of loose throw rugs in walkways, pet beds, electrical cords, etc? Yes Adequate lighting in your home to reduce risk of falls? Yes   ASSISTIVE DEVICES UTILIZED TO PREVENT FALLS:  Life alert? Yes  Use of a cane, walker or w/c? No  Grab bars in the bathroom? Yes  Shower chair or bench in shower? Yes  Elevated toilet seat or a handicapped toilet? No   TIMED UP AND GO:  Was the test performed? Yes .  Length of time to ambulate 10 feet: 10  sec.   Gait steady and fast without use of assistive device  Cognitive Function:        07/17/2022    8:55 AM  6CIT Screen  What Year? 0 points  What month? 0 points  What time? 0 points  Count back from 20 0 points  Months in reverse 4 points  Repeat phrase 0 points  Total Score 4 points  Immunizations Immunization History  Administered Date(s) Administered   Fluad Quad(high Dose 65+) 05/14/2020, 07/16/2021   Influenza Inj Mdck Quad Pf 05/07/2019   Influenza,inj,Quad PF,6+ Mos 04/28/2017, 04/12/2018, 05/13/2022   Influenza-Unspecified 05/24/2015, 06/02/2016   PFIZER(Purple Top)SARS-COV-2 Vaccination 09/24/2019, 10/22/2019   Pneumococcal Conjugate-13 04/28/2017   Pneumococcal Polysaccharide-23 06/26/2015, 07/12/2019   Tdap 10/15/2010, 04/28/2017   Zoster Recombinat (Shingrix) 03/16/2019, 05/01/2020, 10/24/2021    TDAP status: Up to date  Flu Vaccine status: Up to date  Pneumococcal vaccine status: Up to date  Covid-19 vaccine status: Information provided on how to obtain vaccines.   Qualifies for Shingles Vaccine? Yes   Zostavax completed No   Shingrix Completed?: No.    Education has been provided regarding the importance of this vaccine. Patient has been advised to call insurance company to determine out of pocket expense if they have not yet received this vaccine. Advised may also receive vaccine at local pharmacy  or Health Dept. Verbalized acceptance and understanding.  Screening Tests Health Maintenance  Topic Date Due   COVID-19 Vaccine (3 - Pfizer risk series) 11/19/2019   OPHTHALMOLOGY EXAM  05/07/2022   Diabetic kidney evaluation - eGFR measurement  09/11/2022   Diabetic kidney evaluation - Urine ACR  09/11/2022   HEMOGLOBIN A1C  11/12/2022   Lung Cancer Screening  12/31/2022   FOOT EXAM  01/11/2023   Medicare Annual Wellness (AWV)  07/18/2023   MAMMOGRAM  12/19/2023   Fecal DNA (Cologuard)  08/10/2024   DTaP/Tdap/Td (3 - Td or Tdap) 04/29/2027   Pneumonia Vaccine 46+ Years old  Completed   INFLUENZA VACCINE  Completed   DEXA SCAN  Completed   Hepatitis C Screening  Completed   Zoster Vaccines- Shingrix  Completed   HPV VACCINES  Aged Out    Health Maintenance  Health Maintenance Due  Topic Date Due   COVID-19 Vaccine (3 - Pfizer risk series) 11/19/2019   OPHTHALMOLOGY EXAM  05/07/2022    Colorectal cancer screening: Type of screening: Cologuard. Completed 08/10/2021. Repeat every 3 years  Mammogram status: Completed 12/18/2021. Repeat every year  DEXA Scan: 08/11/2019  Lung Cancer Screening: (Low Dose CT Chest recommended if Age 6-80 years, 30 pack-year currently smoking OR have quit w/in 15years.) does not qualify.   Lung Cancer Screening Referral: not applicable  Additional Screening:  Hepatitis C Screening: does qualify; Completed 01/21/2017  Vision Screening: Recommended annual ophthalmology exams for early detection of glaucoma and other disorders of the eye. Is the patient up to date with their annual eye exam?  No  Who is the provider or what is the name of the office in which the patient attends annual eye exams? [facility]  If pt is not established with a provider, would they like to be referred to a provider to establish care? No .   Dental Screening: Recommended annual dental exams for proper oral hygiene  Community Resource Referral / Chronic  Care Management: CRR required this visit?  No   CCM required this visit?  No      Plan:     I have personally reviewed and noted the following in the patient's chart:   Medical and social history Use of alcohol, tobacco or illicit drugs  Current medications and supplements including opioid prescriptions. Patient is not currently taking opioid prescriptions. Functional ability and status Nutritional status Physical activity Advanced directives List of other physicians Hospitalizations, surgeries, and ER visits in previous 12 months Vitals Screenings to include cognitive, depression, and falls Referrals and  appointments  In addition, I have reviewed and discussed with patient certain preventive protocols, quality metrics, and best practice recommendations. A written personalized care plan for preventive services as well as general preventive health recommendations were provided to patient.    Ms. Cavenaugh , Thank you for taking time to come for your Medicare Wellness Visit. I appreciate your ongoing commitment to your health goals. Please review the following plan we discussed and let me know if I can assist you in the future.   These are the goals we discussed:  Goals      Increase physical activity     Recommend increasing physical activity to at least 3 days per week         This is a list of the screening recommended for you and due dates:  Health Maintenance  Topic Date Due   COVID-19 Vaccine (3 - Pfizer risk series) 11/19/2019   Eye exam for diabetics  05/07/2022   Yearly kidney function blood test for diabetes  09/11/2022   Yearly kidney health urinalysis for diabetes  09/11/2022   Hemoglobin A1C  11/12/2022   Screening for Lung Cancer  12/31/2022   Complete foot exam   01/11/2023   Medicare Annual Wellness Visit  07/18/2023   Mammogram  12/19/2023   Cologuard (Stool DNA test)  08/10/2024   DTaP/Tdap/Td vaccine (3 - Td or Tdap) 04/29/2027   Pneumonia Vaccine   Completed   Flu Shot  Completed   DEXA scan (bone density measurement)  Completed   Hepatitis C Screening: USPSTF Recommendation to screen - Ages 69-79 yo.  Completed   Zoster (Shingles) Vaccine  Completed   HPV Vaccine  Aged [address], [place]   07/17/2022   Nurse Notes:Approximately 30 minute Face -To-Face Medicare Wellness Visit

## 2022-07-17 NOTE — Progress Notes (Signed)
Name: Sara Andrews   MRN: [id number]    DOB: 1956/01/11   Date:07/17/2022       Progress Note  Subjective  Chief Complaint  Abrasion on Foot  HPI  She fell in her kitchen about one week ago, tripped, and cut the bottom of her right heel on the corner of her kitchen cabinet. She pilled off the flap of skin, but seems to be taking too long to heal and asked me to evaluate it  Patient Active Problem List   Diagnosis Date Noted   Spasms of the hands or feet 01/10/2022   Insomnia due to mental disorder 01/10/2022   Tendinitis of upper biceps tendon of left shoulder 05/22/2021   Rotator cuff tendinitis, left 05/22/2021   Nontraumatic incomplete tear of left rotator cuff 05/22/2021   History of shingles 03/23/2020   Burning sensation of lower extremity 03/22/2020   Centrilobular emphysema ([place]) 01/12/2020   Senile purpura ([place]) 01/12/2020   Major depression in remission ([place]) 01/12/2020   Dyslipidemia associated with type 2 diabetes mellitus ([place]) 01/12/2020   Bursitis of hip 08/23/2019   Carpal tunnel syndrome 08/23/2019   Thoracic aorta atherosclerosis ([place]) 08/03/2019   Spondylosis of lumbar spine 11/06/2016   Spinal stenosis of lumbar region with neurogenic claudication 10/28/2016   Chronic bilateral low back pain with bilateral sciatica 09/16/2016   Moderate episode of recurrent major depressive disorder ([place]) 08/07/2016   Pain of left heel 06/02/2016   Degenerative cervical disc 03/04/2016   Gastroesophageal reflux disease without esophagitis 02/01/2016   Primary osteoarthritis of right hand 02/01/2016   Paresthesias in left hand 02/01/2016   Asthma-COPD overlap syndrome 10/05/2013   History of smoking 10/05/2013   Hyperlipidemia 10/05/2013   Adult onset hypothyroidism 10/05/2013    Past Surgical History:  Procedure Laterality Date   ABDOMINAL HYSTERECTOMY     CARPAL TUNNEL RELEASE Right    LUMBAR LAMINECTOMY  03/16/2018   L4-5 Laminectomy & Fusion w/ Pedicle  Screws, TLIF & Allograft; Surgeon: Alfredia Client, MD; Location: [facility] MAIN OR; Service: Orthopedics; Laterality: N/A; Prone, ProAxis, Stim Neuromonitoring, O-Arm, Cell Saver, [employer], [employer], Justin, PACS on [facility]     OOPHORECTOMY Bilateral    ORIF TIBIA PLATEAU Left 03/10/2016   Procedure: OPEN REDUCTION INTERNAL FIXATION (ORIF) BICONDYLAR TIBIAL PLATEAU FRACTURE;  Surgeon: Marybelle Killings, MD;  Location: [place];  Service: Orthopedics;  Laterality: Left;   SHOULDER ARTHROSCOPY W/ ROTATOR CUFF REPAIR Right    SHOULDER ARTHROSCOPY WITH SUBACROMIAL DECOMPRESSION, ROTATOR CUFF REPAIR AND BICEP TENDON REPAIR Left 05/21/2021   Procedure: SHOULDER ARTHROSCOPY WITH DEBRIDEMENT, DECOMPRESSION, REPAIR OF PARTIAL THICKNESS ROTATOR CUFF REPAIR, BICEPS TENODESIS;  Surgeon: Corky Mull, MD;  Location: [facility]S;  Service: Orthopedics;  Laterality: Left;   TONSILLECTOMY AND ADENOIDECTOMY      Family History  Problem Relation Age of Onset   Diabetes Father    Heart disease Father    Depression Sister    Alcohol abuse Sister    Anxiety disorder Sister    Bipolar disorder Sister    Pneumonia Sister    Breast cancer Maternal Aunt 107   Breast cancer Paternal Aunt 66   Hypertension Daughter    Breast cancer Daughter    Skin cancer Daughter    Heart attack Daughter    Anxiety disorder Daughter     Social History   Tobacco Use   Smoking status: Former    Packs/day: 2.00    Years: 30.00    Total pack years:  60.00    Types: Cigarettes    Quit date: 09/03/2008    Years since quitting: 13.8   Smokeless tobacco: Never  Substance Use Topics   Alcohol use: No     Current Outpatient Medications:    albuterol (VENTOLIN HFA) 108 (90 Base) MCG/ACT inhaler, Inhale 2 puffs into the lungs every 6 (six) hours as needed for wheezing or shortness of breath., Disp: 6.7 g, Rfl: 2   ARIPiprazole (ABILIFY) 2 MG tablet, Take 1 tablet (2 mg total) by mouth daily., Disp: 90 tablet, Rfl: 0   buPROPion (WELLBUTRIN  XL) 150 MG 24 hr tablet, TAKE 1 TABLET(150 MG) BY MOUTH DAILY, Disp: 90 tablet, Rfl: 1   calcium carbonate (OSCAL) 1500 (600 Ca) MG TABS tablet, Take 1,500 mg by mouth 2 (two) times daily with a meal., Disp: , Rfl:    Cholecalciferol (VITAMIN D PO), Take 1,000 Units by mouth daily. 1000 IU, Disp: , Rfl:    DULoxetine (CYMBALTA) 60 MG capsule, TAKE 1 CAPSULE(60 MG) BY MOUTH DAILY, Disp: 90 capsule, Rfl: 1   etodolac (LODINE) 500 MG tablet, Take 500 mg by mouth 2 (two) times daily., Disp: , Rfl:    fluticasone (FLONASE) 50 MCG/ACT nasal spray, SHAKE LIQUID AND USE 2 SPRAYS IN EACH NOSTRIL DAILY, Disp: 48 g, Rfl: 0   ipratropium-albuterol (DUONEB) 0.5-2.5 (3) MG/3ML SOLN, Take 3 mLs by nebulization 3 (three) times daily as needed. For asthma/copd exacerbation symptoms (instead of albuterol neb), Disp: 180 mL, Rfl: 1   levothyroxine (SYNTHROID) 75 MCG tablet, TAKE 1 TABLET(75 MCG) BY MOUTH DAILY, Disp: 90 tablet, Rfl: 3   lidocaine (XYLOCAINE) 5 % ointment, Apply 1 application topically daily as needed (itching)., Disp: , Rfl:    loratadine (CLARITIN) 10 MG tablet, TAKE 1 TABLET(10 MG) BY MOUTH AT BEDTIME, Disp: 90 tablet, Rfl: 1   Melatonin 10 MG TABS, Take 10 mg by mouth at bedtime., Disp: , Rfl:    montelukast (SINGULAIR) 10 MG tablet, TAKE 1 TABLET(10 MG) BY MOUTH AT BEDTIME, Disp: 90 tablet, Rfl: 1   omeprazole (PRILOSEC) 40 MG capsule, Take 1 capsule (40 mg total) by mouth 2 (two) times daily., Disp: 60 capsule, Rfl: 0   Propylene Glycol (SYSTANE COMPLETE) 0.6 % SOLN, Place 1 drop into both eyes daily as needed (dry eyes)., Disp: , Rfl:    rosuvastatin (CRESTOR) 20 MG tablet, TAKE 1 TABLET(20 MG) BY MOUTH DAILY, Disp: 90 tablet, Rfl: 1   SUMAtriptan (IMITREX) 50 MG tablet, TAKE 1 TABLET BY MOUTH ON ONSET OF HEADACHE. MAY REPEAT IN 2 HOURS IF HEADACHE PERSISTS OR REOCURS. MAX 2 TABLETS IN 24 HOURS, Disp: 10 tablet, Rfl: 0   tiZANidine (ZANAFLEX) 4 MG tablet, Take 4 mg by mouth 2 (two) times daily.,  Disp: , Rfl:    traZODone (DESYREL) 100 MG tablet, TAKE 1 TABLET(100 MG) BY MOUTH AT BEDTIME, Disp: 90 tablet, Rfl: 0   TRELEGY ELLIPTA 100-62.5-25 MCG/ACT AEPB, INHALE 1 PUFF INTO THE LUNGS DAILY, Disp: 180 each, Rfl: 1   triamcinolone cream (KENALOG) 0.1 %, Apply 1 Application topically 2 (two) times daily as needed., Disp: 80 g, Rfl: 0   valACYclovir (VALTREX) 500 MG tablet, TAKE 1 TABLET(500 MG) BY MOUTH DAILY, Disp: 90 tablet, Rfl: 1  No Known Allergies  I personally reviewed active problem list, medication list, allergies, family history, social history, health maintenance with the patient/caregiver today.   ROS  Ten systems reviewed and is negative except as mentioned in HPI   Objective  Vitals:   07/17/22 0930  BP: 118/72  Pulse: 98  Resp: 16  Temp: 98.3 F (36.8 C)  SpO2: 95%  Weight: 147 lb (66.7 kg)  Height: 4\' 11"  (1.499 m)    Body mass index is 29.69 kg/m.  Physical Exam Constitutional: Patient appears well-developed and well-nourished. No distress.  HEENT: head atraumatic, normocephalic, pupils equal and reactive to light, neck supple Cardiovascular: Normal rate, regular rhythm and normal heart sounds.  No murmur heard. No BLE edema. Pulmonary/Chest: Effort normal and breath sounds normal. No respiratory distress. Skin: see attached pictures, the base looks clean, no oozing, extra skin flap was cut off during visit  Abdominal: Soft.  There is no tenderness. Psychiatric: Patient has a normal mood and affect. behavior is normal. Judgment and thought content normal.   Recent Results (from the past 2160 hour(s))  POCT HgB A1C     Status: Abnormal   Collection Time: 05/13/22  2:00 PM  Result Value Ref Range   Hemoglobin A1C 5.5 4.0 - 5.6 %   HbA1c POC (<> result, manual entry)     HbA1c, POC (prediabetic range)     HbA1c, POC (controlled diabetic range)      PHQ2/9:    07/17/2022    9:28 AM 07/17/2022    8:53 AM 05/13/2022    2:03 PM 04/08/2022    8:45  AM 01/10/2022    1:18 PM  Depression screen PHQ 2/9  Decreased Interest 0 0 2 2 0  Down, Depressed, Hopeless 0 0 3 3 0  PHQ - 2 Score 0 0 5 5 0  Altered sleeping   3 3 3   Tired, decreased energy   2 1 1   Change in appetite   0 0 0  Feeling bad or failure about yourself    2 0 0  Trouble concentrating   0 0 0  Moving slowly or fidgety/restless   0 0 0  Suicidal thoughts   0 0 0  PHQ-9 Score   12 9 4   Difficult doing work/chores   Very difficult  Not difficult at all    phq 9 is negative   Fall Risk:    07/17/2022    9:28 AM 07/17/2022    8:54 AM 05/13/2022    1:58 PM 04/08/2022    8:40 AM 01/10/2022    1:17 PM  Fall Risk   Falls in the past year? 0 0 0 0 0  Number falls in past yr: 0 0  0   Injury with Fall? 0 0  0   Risk for fall due to : No Fall Risks No Fall Risks No Fall Risks No Fall Risks No Fall Risks  Follow up Falls prevention discussed Falls evaluation completed Falls prevention discussed;Education provided;Falls evaluation completed Falls prevention discussed Falls prevention discussed      Functional Status Survey: Is the patient deaf or have difficulty hearing?: No Does the patient have difficulty seeing, even when wearing glasses/contacts?: Yes Does the patient have difficulty concentrating, remembering, or making decisions?: No Does the patient have difficulty walking or climbing stairs?: Yes Does the patient have difficulty dressing or bathing?: No Does the patient have difficulty doing errands alone such as visiting a doctor's office or shopping?: No    Assessment & Plan  1. History of recent fall  Discussed fall prevention   2. Wound, open, foot, right, initial encounter   Debridement done, tolerated it well, dressing placed

## 2022-07-30 DIAGNOSIS — M4802 Spinal stenosis, cervical region: Secondary | ICD-10-CM | POA: Diagnosis not present

## 2022-07-30 DIAGNOSIS — M5412 Radiculopathy, cervical region: Secondary | ICD-10-CM | POA: Diagnosis not present

## 2022-08-04 ENCOUNTER — Other Ambulatory Visit: Payer: Self-pay | Admitting: Family Medicine

## 2022-08-04 DIAGNOSIS — M5412 Radiculopathy, cervical region: Secondary | ICD-10-CM

## 2022-08-04 DIAGNOSIS — F325 Major depressive disorder, single episode, in full remission: Secondary | ICD-10-CM

## 2022-08-04 NOTE — Telephone Encounter (Signed)
Medication Refill - Medication: etodolac (LODINE) 500 MG tablet  DULoxetine (CYMBALTA) 60 MG capsule   Has the patient contacted their pharmacy? Yes.   Pt referred to PCP Pt has enough medication for 2 days.  Preferred Pharmacy (with phone number or street name):  [id number] - [place], [place] [place] Phone: [phone]  Fax: [phone]     Has the patient been seen for an appointment in the last year OR does the patient have an upcoming appointment? Yes.    Agent: Please be advised that RX refills may take up to 3 business days. We ask that you follow-up with your pharmacy.

## 2022-08-05 DIAGNOSIS — M5412 Radiculopathy, cervical region: Secondary | ICD-10-CM | POA: Diagnosis not present

## 2022-08-05 DIAGNOSIS — M4802 Spinal stenosis, cervical region: Secondary | ICD-10-CM | POA: Diagnosis not present

## 2022-08-05 MED ORDER — DULOXETINE HCL 60 MG PO CPEP: ORAL_CAPSULE | ORAL | 0 refills | Status: DC

## 2022-08-05 NOTE — Telephone Encounter (Signed)
Requested Prescriptions  Pending Prescriptions Disp Refills   etodolac (LODINE) 500 MG tablet      Sig: Take 1 tablet (500 mg total) by mouth 2 (two) times daily.     Analgesics:  NSAIDS Failed - 08/04/2022 12:23 PM      Failed - Manual Review: Labs are only required if the patient has taken medication for more than 8 weeks.      Failed - HGB in normal range and within 360 days    Hemoglobin  Date Value Ref Range Status  09/17/2020 12.8 11.7 - 15.5 g/dL Final  03/11/2019 12.9 11.1 - 15.9 g/dL Final         Failed - PLT in normal range and within 360 days    Platelets  Date Value Ref Range Status  09/17/2020 245 140 - 400 Thousand/uL Final  03/11/2019 225 150 - 450 x10E3/uL Final         Failed - HCT in normal range and within 360 days    HCT  Date Value Ref Range Status  09/17/2020 38.6 35.0 - 45.0 % Final   Hematocrit  Date Value Ref Range Status  03/11/2019 39.8 34.0 - 46.6 % Final         Passed - Cr in normal range and within 360 days    Creat  Date Value Ref Range Status  09/11/2021 1.04 0.50 - 1.05 mg/dL Final   Creatinine, Urine  Date Value Ref Range Status  09/11/2021 41 20 - 275 mg/dL Final         Passed - eGFR is 30 or above and within 360 days    GFR, Est African American  Date Value Ref Range Status  09/17/2020 92 > OR = 60 mL/min/1.9m2 Final   GFR, Est Non African American  Date Value Ref Range Status  09/17/2020 79 > OR = 60 mL/min/1.49m2 Final   GFR, Estimated  Date Value Ref Range Status  05/20/2021 >60 >60 mL/min Final    Comment:    (NOTE) Calculated using the CKD-EPI Creatinine Equation (2021)    eGFR  Date Value Ref Range Status  09/11/2021 59 (L) > OR = 60 mL/min/1.21m2 Final    Comment:    The eGFR is based on the CKD-EPI 2021 equation. To calculate  the new eGFR from a previous Creatinine or Cystatin C result, go to https://www.kidney.org/professionals/ kdoqi/gfr%5Fcalculator          Passed - Patient is not pregnant       Passed - Valid encounter within last 12 months    Recent Outpatient Visits           2 weeks ago History of recent fall   [facility], Drue Stager, MD   2 months ago Dyslipidemia associated with type 2 diabetes mellitus [facility])   [facility] Steele Sizer, MD   3 months ago Major depression, recurrent, chronic [facility])   [facility] Steele Sizer, MD   6 months ago Dyslipidemia associated with type 2 diabetes mellitus [facility])   [facility] Steele Sizer, MD   8 months ago Asthma-COPD overlap syndrome [facility])   [place], DO       Future Appointments             In 1 week Steele Sizer, MD [facility]             DULoxetine (CYMBALTA) 60 MG capsule 90  capsule 1    Sig: TAKE 1 CAPSULE(60 MG) BY MOUTH DAILY     Psychiatry: Antidepressants - SNRI - duloxetine Passed - 08/04/2022 12:23 PM      Passed - Cr in normal range and within 360 days    Creat  Date Value Ref Range Status  09/11/2021 1.04 0.50 - 1.05 mg/dL Final   Creatinine, Urine  Date Value Ref Range Status  09/11/2021 41 20 - 275 mg/dL Final         Passed - eGFR is 30 or above and within 360 days    GFR, Est African American  Date Value Ref Range Status  09/17/2020 92 > OR = 60 mL/min/1.74m2 Final   GFR, Est Non African American  Date Value Ref Range Status  09/17/2020 79 > OR = 60 mL/min/1.7m2 Final   GFR, Estimated  Date Value Ref Range Status  05/20/2021 >60 >60 mL/min Final    Comment:    (NOTE) Calculated using the CKD-EPI Creatinine Equation (2021)    eGFR  Date Value Ref Range Status  09/11/2021 59 (L) > OR = 60 mL/min/1.48m2 Final    Comment:    The eGFR is based on the CKD-EPI 2021 equation. To calculate  the new eGFR from a previous Creatinine or Cystatin C result, go to https://www.kidney.org/professionals/ kdoqi/gfr%5Fcalculator           Passed - Completed PHQ-2 or PHQ-9 in the last 360 days      Passed - Last BP in normal range    BP Readings from Last 1 Encounters:  07/17/22 118/72         Passed - Valid encounter within last 6 months    Recent Outpatient Visits           2 weeks ago History of recent fall   [facility] [place], Danna Hefty, MD   2 months ago Dyslipidemia associated with type 2 diabetes mellitus [facility])   [facility] Alba Cory, MD   3 months ago Major depression, recurrent, chronic [facility])   [facility] [place], Danna Hefty, MD   6 months ago Dyslipidemia associated with type 2 diabetes mellitus [facility])   [facility] Alba Cory, MD   8 months ago Asthma-COPD overlap syndrome [facility])   [facility] Caro Laroche, DO       Future Appointments             In 1 week Alba Cory, MD [facility], [facility]

## 2022-08-05 NOTE — Telephone Encounter (Signed)
Requested medication (s) are due for refill today: Amount not specified  Requested medication (s) are on the active medication list: yes    Last refill: 05/12/22. Amount not specified  Future visit scheduled yes   Notes to clinic: Historical provider, please review. Thank you  Requested Prescriptions  Pending Prescriptions Disp Refills   etodolac (LODINE) 500 MG tablet      Sig: Take 1 tablet (500 mg total) by mouth 2 (two) times daily.     Analgesics:  NSAIDS Failed - 08/04/2022 12:23 PM      Failed - Manual Review: Labs are only required if the patient has taken medication for more than 8 weeks.      Failed - HGB in normal range and within 360 days    Hemoglobin  Date Value Ref Range Status  09/17/2020 12.8 11.7 - 15.5 g/dL Final  03/11/2019 12.9 11.1 - 15.9 g/dL Final         Failed - PLT in normal range and within 360 days    Platelets  Date Value Ref Range Status  09/17/2020 245 140 - 400 Thousand/uL Final  03/11/2019 225 150 - 450 x10E3/uL Final         Failed - HCT in normal range and within 360 days    HCT  Date Value Ref Range Status  09/17/2020 38.6 35.0 - 45.0 % Final   Hematocrit  Date Value Ref Range Status  03/11/2019 39.8 34.0 - 46.6 % Final         Passed - Cr in normal range and within 360 days    Creat  Date Value Ref Range Status  09/11/2021 1.04 0.50 - 1.05 mg/dL Final   Creatinine, Urine  Date Value Ref Range Status  09/11/2021 41 20 - 275 mg/dL Final         Passed - eGFR is 30 or above and within 360 days    GFR, Est African American  Date Value Ref Range Status  09/17/2020 92 > OR = 60 mL/min/1.13m2 Final   GFR, Est Non African American  Date Value Ref Range Status  09/17/2020 79 > OR = 60 mL/min/1.72m2 Final   GFR, Estimated  Date Value Ref Range Status  05/20/2021 >60 >60 mL/min Final    Comment:    (NOTE) Calculated using the CKD-EPI Creatinine Equation (2021)    eGFR  Date Value Ref Range Status  09/11/2021 59 (L) > OR  = 60 mL/min/1.51m2 Final    Comment:    The eGFR is based on the CKD-EPI 2021 equation. To calculate  the new eGFR from a previous Creatinine or Cystatin C result, go to https://www.kidney.org/professionals/ kdoqi/gfr%5Fcalculator          Passed - Patient is not pregnant      Passed - Valid encounter within last 12 months    Recent Outpatient Visits           2 weeks ago History of recent fall   [facility] [place], Drue Stager, MD   2 months ago Dyslipidemia associated with type 2 diabetes mellitus [facility])   [facility] Steele Sizer, MD   3 months ago Major depression, recurrent, chronic [facility])   [facility] Steele Sizer, MD   6 months ago Dyslipidemia associated with type 2 diabetes mellitus [facility])   [facility] Steele Sizer, MD   8 months ago Asthma-COPD overlap syndrome [facility])   [facility] Myles Gip, [place]  Future Appointments             In 1 week Alba Cory, MD [facility], [facility]            Signed Prescriptions Disp Refills   DULoxetine (CYMBALTA) 60 MG capsule 90 capsule 0    Sig: TAKE 1 CAPSULE(60 MG) BY MOUTH DAILY     Psychiatry: Antidepressants - SNRI - duloxetine Passed - 08/04/2022 12:23 PM      Passed - Cr in normal range and within 360 days    Creat  Date Value Ref Range Status  09/11/2021 1.04 0.50 - 1.05 mg/dL Final   Creatinine, Urine  Date Value Ref Range Status  09/11/2021 41 20 - 275 mg/dL Final         Passed - eGFR is 30 or above and within 360 days    GFR, Est African American  Date Value Ref Range Status  09/17/2020 92 > OR = 60 mL/min/1.20m2 Final   GFR, Est Non African American  Date Value Ref Range Status  09/17/2020 79 > OR = 60 mL/min/1.45m2 Final   GFR, Estimated  Date Value Ref Range Status  05/20/2021 >60 >60 mL/min Final    Comment:    (NOTE) Calculated using the CKD-EPI Creatinine  Equation (2021)    eGFR  Date Value Ref Range Status  09/11/2021 59 (L) > OR = 60 mL/min/1.76m2 Final    Comment:    The eGFR is based on the CKD-EPI 2021 equation. To calculate  the new eGFR from a previous Creatinine or Cystatin C result, go to https://www.kidney.org/professionals/ kdoqi/gfr%5Fcalculator          Passed - Completed PHQ-2 or PHQ-9 in the last 360 days      Passed - Last BP in normal range    BP Readings from Last 1 Encounters:  07/17/22 118/72         Passed - Valid encounter within last 6 months    Recent Outpatient Visits           2 weeks ago History of recent fall   [facility], Danna Hefty, MD   2 months ago Dyslipidemia associated with type 2 diabetes mellitus [facility])   [facility] Alba Cory, MD   3 months ago Major depression, recurrent, chronic [facility])   [facility] [place], Danna Hefty, MD   6 months ago Dyslipidemia associated with type 2 diabetes mellitus [facility])   [facility] Alba Cory, MD   8 months ago Asthma-COPD overlap syndrome [facility])   [facility] Caro Laroche, DO       Future Appointments             In 1 week Alba Cory, MD [facility], [facility]

## 2022-08-08 ENCOUNTER — Other Ambulatory Visit: Payer: Self-pay | Admitting: Family Medicine

## 2022-08-08 DIAGNOSIS — J432 Centrilobular emphysema: Secondary | ICD-10-CM

## 2022-08-08 DIAGNOSIS — J4489 Other specified chronic obstructive pulmonary disease: Secondary | ICD-10-CM

## 2022-08-08 DIAGNOSIS — E038 Other specified hypothyroidism: Secondary | ICD-10-CM

## 2022-08-12 ENCOUNTER — Other Ambulatory Visit: Payer: Self-pay | Admitting: Family Medicine

## 2022-08-12 DIAGNOSIS — F325 Major depressive disorder, single episode, in full remission: Secondary | ICD-10-CM

## 2022-08-12 NOTE — Progress Notes (Signed)
Name: Sara Andrews   MRN: [id number]    DOB: April 01, 1956   Date:08/13/2022       Progress Note  Subjective  Chief Complaint  Follow Up  HPI  Major Depression:  no longer seeing Dr. Shea Evans but taking medication as prescribed - Duloxetine and Wellbutrin during the day and Trazodone at night, she feels anxious at night and keeps scratching her head ( she feels it is her nerves), we tried to switched from Trazodone to Seroquel but she kept taking both medication. She states she is feeling a little better but still wants to see a psychiatrist, currently talking to a therapist through her insurance and it has helped. . I referred her back to psychiatrist, Dr. Shea Evans cannot see her, Dr. Nicolasa Ducking is not in network and one of the facilities only sees patients younger than 15, we sent her to [facility] but she has not heard back from them, I will give [facility] number to patient today .  Recurrent headaches: doing well on prn imitrex and zofran, she states not as frequent, she described pain as throbbing, photophobia, eye pain during episodes. Episodes are about once a month  DDD and spondylolisthesis/Neck pain : doing well since surgery done 02/2018. She is off pain  medication, taking prn tylenol only and since pain is under control, she used to see Dr. Patrice Paradise, now seeing Dr. Phyllis Ginger, she had some steroid injections, seems to be doing okay. Pain on her neck is 3/10   RLS: : only at night, she has to stand up and walk around and it improves, she is taking magnesium.Requip made symptoms worse, she stopped medication but states doing better now  COPD/asthma/Emphysema  : she is back on Trelegy, she rinses her mouth after she uses it. She has been sick for the past 5 days, started with rhinorrhea and nasal congestion, grandson same symptoms. She is up to date with flu, covid and pneumonia shots. She states over the past few days coughing more than usual, sounds wet but not able to bring it up . She also has noticed some  SOB but no wheezing. No fever or chills. Appetite is down. No nausea, vomiting or diarrhea.   Recurrent scalp pruritis: resolves since started taking Valtrex every night, she wants to continue taking it, otherwise she has outbreaks    GERD/dyspesia: she is no longer taking Ozempic, but states indigestion is worse, heartburn, associated with heartburn , nausea. Taking PPI  and doing well at this time    Hypothyroidism: She is currently taking 75 mcg daily and half on Mondays, last TSH was at goal .Denies change in bowel movements or hair loss, we will recheck level today    DM II : diagnosed at work back in 2012 she used to take medications  She denies polyphagia, polyuria , she has polydipsia.  Started on Trulicity June 9562, but she was having worsening of constipation and indigestion, so we switched to Ozempic 04/2017 weight was 198 lbs and went  down to 132.4  lbs. She was of Ozempic since January 21 and weight went up to 160 lbs, we stopped medication due to bloating and nausea. She has been off medication and A1C is still at goal, weight is stable in the high 140 lbs     Osteoporosis: she had first Reclast injection January 2023 and two weeks later developed bilateral shoulder pain , she is due for repeat bone density and also needs to go back to Dr. Honor Junes   Chest  pain: intermittently and states not as often now,  she is under the care of  cardiologist. Dr. Azucena Cecil and had EKG and echo that showed normal EF . CT lung had already shown some coronary calcification. She is compliant with statin therapy   Unchanged   Senile purpura: reassurance given. Both arms and legs  Stable    Atherosclerosis Aorta: found on CT done 03/20/2020, on statin therapy , no side effects of medication , last LDL was at goal, we will recheck labs today   Patient Active Problem List   Diagnosis Date Noted   Spasms of the hands or feet 01/10/2022   Insomnia due to mental disorder 01/10/2022   Tendinitis of  upper biceps tendon of left shoulder 05/22/2021   Rotator cuff tendinitis, left 05/22/2021   Nontraumatic incomplete tear of left rotator cuff 05/22/2021   History of shingles 03/23/2020   Burning sensation of lower extremity 03/22/2020   Centrilobular emphysema (HCC) 01/12/2020   Senile purpura (HCC) 01/12/2020   Major depression in remission (HCC) 01/12/2020   Dyslipidemia associated with type 2 diabetes mellitus (HCC) 01/12/2020   Bursitis of hip 08/23/2019   Carpal tunnel syndrome 08/23/2019   Thoracic aorta atherosclerosis (HCC) 08/03/2019   Spondylosis of lumbar spine 11/06/2016   Spinal stenosis of lumbar region with neurogenic claudication 10/28/2016   Chronic bilateral low back pain with bilateral sciatica 09/16/2016   Moderate episode of recurrent major depressive disorder (HCC) 08/07/2016   Pain of left heel 06/02/2016   Degenerative cervical disc 03/04/2016   Gastroesophageal reflux disease without esophagitis 02/01/2016   Primary osteoarthritis of right hand 02/01/2016   Paresthesias in left hand 02/01/2016   Asthma-COPD overlap syndrome 10/05/2013   History of smoking 10/05/2013   Hyperlipidemia 10/05/2013   Adult onset hypothyroidism 10/05/2013    Past Surgical History:  Procedure Laterality Date   ABDOMINAL HYSTERECTOMY     CARPAL TUNNEL RELEASE Right    LUMBAR LAMINECTOMY  03/16/2018   L4-5 Laminectomy & Fusion w/ Pedicle Screws, TLIF & Allograft; Surgeon: Virl Diamond, MD; Location: [facility] MAIN OR; Service: Orthopedics; Laterality: N/A; Prone, ProAxis, Stim Neuromonitoring, O-Arm, Cell Saver, Bone Mill, Medtronic, Justin, PACS on [facility]     OOPHORECTOMY Bilateral    ORIF TIBIA PLATEAU Left 03/10/2016   Procedure: OPEN REDUCTION INTERNAL FIXATION (ORIF) BICONDYLAR TIBIAL PLATEAU FRACTURE;  Surgeon: Eldred Manges, MD;  Location: [facility] OR;  Service: Orthopedics;  Laterality: Left;   SHOULDER ARTHROSCOPY W/ ROTATOR CUFF REPAIR Right    SHOULDER ARTHROSCOPY WITH  SUBACROMIAL DECOMPRESSION, ROTATOR CUFF REPAIR AND BICEP TENDON REPAIR Left 05/21/2021   Procedure: SHOULDER ARTHROSCOPY WITH DEBRIDEMENT, DECOMPRESSION, REPAIR OF PARTIAL THICKNESS ROTATOR CUFF REPAIR, BICEPS TENODESIS;  Surgeon: Christena Flake, MD;  Location: [facility] ORS;  Service: Orthopedics;  Laterality: Left;   TONSILLECTOMY AND ADENOIDECTOMY      Family History  Problem Relation Age of Onset   Diabetes Father    Heart disease Father    Depression Sister    Alcohol abuse Sister    Anxiety disorder Sister    Bipolar disorder Sister    Pneumonia Sister    Breast cancer Maternal Aunt 13   Breast cancer Paternal Aunt 69   Hypertension Daughter    Breast cancer Daughter    Skin cancer Daughter    Heart attack Daughter    Anxiety disorder Daughter     Social History   Tobacco Use   Smoking status: Former    Packs/day: 2.00    Years:  30.00    Total pack years: 60.00    Types: Cigarettes    Quit date: 09/03/2008    Years since quitting: 13.9   Smokeless tobacco: Never  Substance Use Topics   Alcohol use: No     Current Outpatient Medications:    benzonatate (TESSALON) 100 MG capsule, Take 1-2 capsules (100-200 mg total) by mouth 2 (two) times daily as needed., Disp: 40 capsule, Rfl: 0   calcium carbonate (OSCAL) 1500 (600 Ca) MG TABS tablet, Take 1,500 mg by mouth 2 (two) times daily with a meal., Disp: , Rfl:    Cholecalciferol (VITAMIN D PO), Take 1,000 Units by mouth daily. 1000 IU, Disp: , Rfl:    DULoxetine (CYMBALTA) 60 MG capsule, TAKE 1 CAPSULE(60 MG) BY MOUTH DAILY, Disp: 90 capsule, Rfl: 0   etodolac (LODINE) 500 MG tablet, Take 500 mg by mouth 2 (two) times daily., Disp: , Rfl:    fluticasone (FLONASE) 50 MCG/ACT nasal spray, SHAKE LIQUID AND USE 2 SPRAYS IN EACH NOSTRIL DAILY, Disp: 48 g, Rfl: 0   Fluticasone-Umeclidin-Vilant (TRELEGY ELLIPTA) 100-62.5-25 MCG/ACT AEPB, INHALE 1 PUFF INTO THE LUNGS DAILY, Disp: 180 each, Rfl: 0   ipratropium-albuterol (DUONEB)  0.5-2.5 (3) MG/3ML SOLN, Take 3 mLs by nebulization 3 (three) times daily as needed. For asthma/copd exacerbation symptoms (instead of albuterol neb), Disp: 180 mL, Rfl: 1   lidocaine (XYLOCAINE) 5 % ointment, Apply 1 application topically daily as needed (itching)., Disp: , Rfl:    Melatonin 10 MG TABS, Take 10 mg by mouth at bedtime., Disp: , Rfl:    predniSONE (DELTASONE) 10 MG tablet, Take 1 tablet (10 mg total) by mouth 2 (two) times daily with a meal., Disp: 10 tablet, Rfl: 0   Propylene Glycol (SYSTANE COMPLETE) 0.6 % SOLN, Place 1 drop into both eyes daily as needed (dry eyes)., Disp: , Rfl:    tiZANidine (ZANAFLEX) 4 MG tablet, Take 4 mg by mouth 2 (two) times daily., Disp: , Rfl:    triamcinolone cream (KENALOG) 0.1 %, Apply 1 Application topically 2 (two) times daily as needed., Disp: 80 g, Rfl: 0   albuterol (VENTOLIN HFA) 108 (90 Base) MCG/ACT inhaler, Inhale 2 puffs into the lungs every 6 (six) hours as needed for wheezing or shortness of breath., Disp: 6.7 g, Rfl: 2   ARIPiprazole (ABILIFY) 2 MG tablet, Take 1 tablet (2 mg total) by mouth daily., Disp: 90 tablet, Rfl: 0   buPROPion (WELLBUTRIN XL) 150 MG 24 hr tablet, TAKE 1 TABLET(150 MG) BY MOUTH DAILY, Disp: 90 tablet, Rfl: 0   levothyroxine (SYNTHROID) 75 MCG tablet, Take 1 tablet (75 mcg total) by mouth daily before breakfast., Disp: 30 tablet, Rfl: 0   loratadine (CLARITIN) 10 MG tablet, TAKE 1 TABLET(10 MG) BY MOUTH AT BEDTIME, Disp: 90 tablet, Rfl: 1   montelukast (SINGULAIR) 10 MG tablet, Take 1 tablet (10 mg total) by mouth at bedtime., Disp: 90 tablet, Rfl: 1   omeprazole (PRILOSEC) 40 MG capsule, Take 1 capsule (40 mg total) by mouth daily., Disp: 90 capsule, Rfl: 1   rosuvastatin (CRESTOR) 20 MG tablet, TAKE 1 TABLET(20 MG) BY MOUTH DAILY, Disp: 90 tablet, Rfl: 1   SUMAtriptan (IMITREX) 50 MG tablet, TAKE 1 TABLET BY MOUTH ON ONSET OF HEADACHE. MAY REPEAT IN 2 HOURS IF HEADACHE PERSISTS OR REOCURS. MAX 2 TABLETS IN 24  HOURS, Disp: 10 tablet, Rfl: 0   traZODone (DESYREL) 100 MG tablet, TAKE 1 TABLET(100 MG) BY MOUTH AT BEDTIME, Disp: 90 tablet,  Rfl: 0   valACYclovir (VALTREX) 500 MG tablet, Take 1 tablet (500 mg total) by mouth daily., Disp: 90 tablet, Rfl: 1  No Known Allergies  I personally reviewed active problem list, medication list, allergies, family history, social history, health maintenance with the patient/caregiver today.   ROS  Ten systems reviewed and is negative except as mentioned in HPI   Objective  Vitals:   08/13/22 1030  BP: 110/72  Pulse: 98  Resp: 16  Temp: 97.9 F (36.6 C)  TempSrc: Oral  SpO2: 95%  Weight: 146 lb 11.2 oz (66.5 kg)  Height: 4\' 11"  (1.499 m)    Body mass index is 29.63 kg/m.  Physical Exam  Constitutional: Patient appears well-developed and well-nourished. Obese  No distress.  HEENT: head atraumatic, normocephalic, pupils equal and reactive to light, neck supple Cardiovascular: Normal rate, regular rhythm and normal heart sounds.  No murmur heard. No BLE edema. Pulmonary/Chest: Effort normal and breath sounds normal. No respiratory distress. Abdominal: Soft.  There is no tenderness. Psychiatric: Patient has a normal mood and affect. behavior is normal. Judgment and thought content normal.    PHQ2/9:    08/13/2022   11:21 AM 08/13/2022   10:29 AM 07/17/2022    9:28 AM 07/17/2022    8:53 AM 05/13/2022    2:03 PM  Depression screen PHQ 2/9  Decreased Interest 0 0 0 0 2  Down, Depressed, Hopeless 0 0 0 0 3  PHQ - 2 Score 0 0 0 0 5  Altered sleeping 1 0   3  Tired, decreased energy 1 0   2  Change in appetite 0 0   0  Feeling bad or failure about yourself  0 0   2  Trouble concentrating 0 0   0  Moving slowly or fidgety/restless 0 0   0  Suicidal thoughts 0 0   0  PHQ-9 Score 2 0   12  Difficult doing work/chores Not difficult at all Not difficult at all   Very difficult    phq 9 is negative   Fall Risk:    08/13/2022   10:29 AM  07/17/2022    9:28 AM 07/17/2022    8:54 AM 05/13/2022    1:58 PM 04/08/2022    8:40 AM  Fall Risk   Falls in the past year? 0 0 0 0 0  Number falls in past yr: 0 0 0  0  Injury with Fall? 0 0 0  0  Risk for fall due to : No Fall Risks No Fall Risks No Fall Risks No Fall Risks No Fall Risks  Follow up Falls prevention discussed;Education provided;Falls evaluation completed Falls prevention discussed Falls evaluation completed Falls prevention discussed;Education provided;Falls evaluation completed Falls prevention discussed     Assessment & Plan  1. Dyslipidemia associated with type 2 diabetes mellitus (HCC)  - POCT HgB A1C - COMPLETE METABOLIC PANEL WITH GFR - Microalbumin / creatinine urine ratio  2. Centrilobular emphysema (HCC)  - albuterol (VENTOLIN HFA) 108 (90 Base) MCG/ACT inhaler; Inhale 2 puffs into the lungs every 6 (six) hours as needed for wheezing or shortness of breath.  Dispense: 6.7 g; Refill: 2  3. Thoracic aorta atherosclerosis (HCC)  - rosuvastatin (CRESTOR) 20 MG tablet; TAKE 1 TABLET(20 MG) BY MOUTH DAILY  Dispense: 90 tablet; Refill: 1  4. Major depression, recurrent, chronic (HCC)  - ARIPiprazole (ABILIFY) 2 MG tablet; Take 1 tablet (2 mg total) by mouth daily.  Dispense: 90 tablet; Refill: 0  5. Senile purpura ([place])  Reassurance given   6. Asthma-COPD overlap syndrome  - montelukast (SINGULAIR) 10 MG tablet; Take 1 tablet (10 mg total) by mouth at bedtime.  Dispense: 90 tablet; Refill: 1 - loratadine (CLARITIN) 10 MG tablet; TAKE 1 TABLET(10 MG) BY MOUTH AT BEDTIME  Dispense: 90 tablet; Refill: 1 - predniSONE (DELTASONE) 10 MG tablet; Take 1 tablet (10 mg total) by mouth 2 (two) times daily with a meal.  Dispense: 10 tablet; Refill: 0  7. Migraine without aura and without status migrainosus, not intractable  - SUMAtriptan (IMITREX) 50 MG tablet; TAKE 1 TABLET BY MOUTH ON ONSET OF HEADACHE. MAY REPEAT IN 2 HOURS IF HEADACHE PERSISTS OR REOCURS. MAX  2 TABLETS IN 24 HOURS  Dispense: 10 tablet; Refill: 0  8. Dyslipidemia  - rosuvastatin (CRESTOR) 20 MG tablet; TAKE 1 TABLET(20 MG) BY MOUTH DAILY  Dispense: 90 tablet; Refill: 1 - Lipid panel  9. Gastroesophageal reflux disease without esophagitis  - omeprazole (PRILOSEC) 40 MG capsule; Take 1 capsule (40 mg total) by mouth daily.  Dispense: 90 capsule; Refill: 1  10. Insomnia due to mental disorder  - traZODone (DESYREL) 100 MG tablet; TAKE 1 TABLET(100 MG) BY MOUTH AT BEDTIME  Dispense: 90 tablet; Refill: 0  11. Adult onset hypothyroidism  - levothyroxine (SYNTHROID) 75 MCG tablet; Take 1 tablet (75 mcg total) by mouth daily before breakfast.  Dispense: 30 tablet; Refill: 0 - TSH  12. RLS (restless legs syndrome)  - CBC with Differential/Platelet  13. Age-related osteoporosis without current pathological fracture  - DG Bone Density; Future  14. Recurrent type 1 herpes simplex of other site  - valACYclovir (VALTREX) 500 MG tablet; Take 1 tablet (500 mg total) by mouth daily.  Dispense: 90 tablet; Refill: 1

## 2022-08-13 ENCOUNTER — Encounter: Payer: Self-pay | Admitting: Family Medicine

## 2022-08-13 ENCOUNTER — Ambulatory Visit (INDEPENDENT_AMBULATORY_CARE_PROVIDER_SITE_OTHER): Payer: Medicare HMO | Admitting: Family Medicine

## 2022-08-13 VITALS — BP 110/72 | HR 98 | Temp 97.9°F | Resp 16 | Ht 59.0 in | Wt 146.7 lb

## 2022-08-13 DIAGNOSIS — J432 Centrilobular emphysema: Secondary | ICD-10-CM

## 2022-08-13 DIAGNOSIS — D692 Other nonthrombocytopenic purpura: Secondary | ICD-10-CM | POA: Diagnosis not present

## 2022-08-13 DIAGNOSIS — F5105 Insomnia due to other mental disorder: Secondary | ICD-10-CM

## 2022-08-13 DIAGNOSIS — G43009 Migraine without aura, not intractable, without status migrainosus: Secondary | ICD-10-CM

## 2022-08-13 DIAGNOSIS — K219 Gastro-esophageal reflux disease without esophagitis: Secondary | ICD-10-CM | POA: Diagnosis not present

## 2022-08-13 DIAGNOSIS — J4489 Other specified chronic obstructive pulmonary disease: Secondary | ICD-10-CM

## 2022-08-13 DIAGNOSIS — I700 Atherosclerosis of aorta: Secondary | ICD-10-CM

## 2022-08-13 DIAGNOSIS — E1169 Type 2 diabetes mellitus with other specified complication: Secondary | ICD-10-CM

## 2022-08-13 DIAGNOSIS — E785 Hyperlipidemia, unspecified: Secondary | ICD-10-CM

## 2022-08-13 DIAGNOSIS — M810 Age-related osteoporosis without current pathological fracture: Secondary | ICD-10-CM

## 2022-08-13 DIAGNOSIS — E038 Other specified hypothyroidism: Secondary | ICD-10-CM | POA: Diagnosis not present

## 2022-08-13 DIAGNOSIS — F339 Major depressive disorder, recurrent, unspecified: Secondary | ICD-10-CM

## 2022-08-13 DIAGNOSIS — R69 Illness, unspecified: Secondary | ICD-10-CM | POA: Diagnosis not present

## 2022-08-13 DIAGNOSIS — B009 Herpesviral infection, unspecified: Secondary | ICD-10-CM

## 2022-08-13 DIAGNOSIS — G2581 Restless legs syndrome: Secondary | ICD-10-CM | POA: Diagnosis not present

## 2022-08-13 LAB — POCT GLYCOSYLATED HEMOGLOBIN (HGB A1C): Hemoglobin A1C: 5.9 % — AB (ref 4.0–5.6)

## 2022-08-13 MED ORDER — OMEPRAZOLE 40 MG PO CPDR: 40.0000 mg | DELAYED_RELEASE_CAPSULE | Freq: Every day | ORAL | 1 refills | Status: DC

## 2022-08-13 MED ORDER — PREDNISONE 10 MG PO TABS: 10.0000 mg | ORAL_TABLET | Freq: Two times a day (BID) | ORAL | 0 refills | Status: DC

## 2022-08-13 MED ORDER — ROSUVASTATIN CALCIUM 20 MG PO TABS: ORAL_TABLET | ORAL | 1 refills | Status: DC

## 2022-08-13 MED ORDER — TRAZODONE HCL 100 MG PO TABS: ORAL_TABLET | ORAL | 0 refills | Status: DC

## 2022-08-13 MED ORDER — BENZONATATE 100 MG PO CAPS: 100.0000 mg | ORAL_CAPSULE | Freq: Two times a day (BID) | ORAL | 0 refills | Status: DC | PRN

## 2022-08-13 MED ORDER — LEVOTHYROXINE SODIUM 75 MCG PO TABS: 75.0000 ug | ORAL_TABLET | Freq: Every day | ORAL | 0 refills | Status: DC

## 2022-08-13 MED ORDER — SUMATRIPTAN SUCCINATE 50 MG PO TABS: ORAL_TABLET | ORAL | 0 refills | Status: DC

## 2022-08-13 MED ORDER — ALBUTEROL SULFATE HFA 108 (90 BASE) MCG/ACT IN AERS: 2.0000 | INHALATION_SPRAY | Freq: Four times a day (QID) | RESPIRATORY_TRACT | 2 refills | Status: DC | PRN

## 2022-08-13 MED ORDER — VALACYCLOVIR HCL 500 MG PO TABS: 500.0000 mg | ORAL_TABLET | Freq: Every day | ORAL | 1 refills | Status: DC

## 2022-08-13 MED ORDER — MONTELUKAST SODIUM 10 MG PO TABS: 10.0000 mg | ORAL_TABLET | Freq: Every day | ORAL | 1 refills | Status: DC

## 2022-08-13 MED ORDER — ARIPIPRAZOLE 2 MG PO TABS: 2.0000 mg | ORAL_TABLET | Freq: Every day | ORAL | 0 refills | Status: DC

## 2022-08-13 MED ORDER — LORATADINE 10 MG PO TABS: ORAL_TABLET | ORAL | 1 refills | Status: DC

## 2022-08-13 MED ORDER — BUPROPION HCL ER (XL) 150 MG PO TB24: ORAL_TABLET | ORAL | 0 refills | Status: DC

## 2022-08-13 NOTE — Patient Instructions (Addendum)
Referral has been sent to [facility] for [facility] Disorders   P: [phone] F: [phone]   Release ID # [id number]   Dr Honor Junes - Endo for osteoporosis  His number is [phone]

## 2022-08-14 LAB — CBC WITH DIFFERENTIAL/PLATELET
Absolute Monocytes: 576 cells/uL (ref 200–950)
Basophils Absolute: 20 cells/uL (ref 0–200)
Basophils Relative: 0.5 %
Eosinophils Absolute: 88 cells/uL (ref 15–500)
Eosinophils Relative: 2.2 %
HCT: 39.4 % (ref 35.0–45.0)
Hemoglobin: 13.2 g/dL (ref 11.7–15.5)
Lymphs Abs: 1592 cells/uL (ref 850–3900)
MCH: 31.5 pg (ref 27.0–33.0)
MCHC: 33.5 g/dL (ref 32.0–36.0)
MCV: 94 fL (ref 80.0–100.0)
MPV: 9.7 fL (ref 7.5–12.5)
Monocytes Relative: 14.4 %
Neutro Abs: 1724 cells/uL (ref 1500–7800)
Neutrophils Relative %: 43.1 %
Platelets: 196 10*3/uL (ref 140–400)
RBC: 4.19 10*6/uL (ref 3.80–5.10)
RDW: 11.5 % (ref 11.0–15.0)
Total Lymphocyte: 39.8 %
WBC: 4 10*3/uL (ref 3.8–10.8)

## 2022-08-14 LAB — COMPLETE METABOLIC PANEL WITH GFR
AG Ratio: 1.7 (calc) (ref 1.0–2.5)
ALT: 18 U/L (ref 6–29)
AST: 19 U/L (ref 10–35)
Albumin: 3.7 g/dL (ref 3.6–5.1)
Alkaline phosphatase (APISO): 60 U/L (ref 37–153)
BUN: 14 mg/dL (ref 7–25)
CO2: 27 mmol/L (ref 20–32)
Calcium: 8.5 mg/dL — ABNORMAL LOW (ref 8.6–10.4)
Chloride: 105 mmol/L (ref 98–110)
Creat: 0.92 mg/dL (ref 0.50–1.05)
Globulin: 2.2 g/dL (calc) (ref 1.9–3.7)
Glucose, Bld: 97 mg/dL (ref 65–99)
Potassium: 3.9 mmol/L (ref 3.5–5.3)
Sodium: 140 mmol/L (ref 135–146)
Total Bilirubin: 0.4 mg/dL (ref 0.2–1.2)
Total Protein: 5.9 g/dL — ABNORMAL LOW (ref 6.1–8.1)
eGFR: 68 mL/min/{1.73_m2} (ref >=60)

## 2022-08-14 LAB — LIPID PANEL
Cholesterol: 120 mg/dL (ref <200)
HDL: 58 mg/dL (ref >=50)
LDL Cholesterol (Calc): 46 mg/dL (calc)
Non-HDL Cholesterol (Calc): 62 mg/dL (calc) (ref <130)
Total CHOL/HDL Ratio: 2.1 (calc) (ref <5.0)
Triglycerides: 76 mg/dL (ref <150)

## 2022-08-14 LAB — MICROALBUMIN / CREATININE URINE RATIO
Creatinine, Urine: 76 mg/dL (ref 20–275)
Microalb Creat Ratio: 7 mcg/mg creat (ref <30)
Microalb, Ur: 0.5 mg/dL

## 2022-08-14 LAB — TSH: TSH: 2.39 mIU/L (ref 0.40–4.50)

## 2022-08-17 DIAGNOSIS — J441 Chronic obstructive pulmonary disease with (acute) exacerbation: Secondary | ICD-10-CM | POA: Diagnosis not present

## 2022-08-17 DIAGNOSIS — J019 Acute sinusitis, unspecified: Secondary | ICD-10-CM | POA: Diagnosis not present

## 2022-09-04 DIAGNOSIS — M4802 Spinal stenosis, cervical region: Secondary | ICD-10-CM | POA: Diagnosis not present

## 2022-09-04 DIAGNOSIS — M5412 Radiculopathy, cervical region: Secondary | ICD-10-CM | POA: Diagnosis not present

## 2022-09-05 NOTE — Progress Notes (Unsigned)
Name: Sara Andrews   MRN: [id number]    DOB: 08-22-1955   Date:09/05/2022       Progress Note  Subjective  Chief Complaint  Hand Trimmers/ Shaking  HPI  *** Patient Active Problem List   Diagnosis Date Noted   Spasms of the hands or feet 01/10/2022   Insomnia due to mental disorder 01/10/2022   Tendinitis of upper biceps tendon of left shoulder 05/22/2021   Rotator cuff tendinitis, left 05/22/2021   Nontraumatic incomplete tear of left rotator cuff 05/22/2021   History of shingles 03/23/2020   Burning sensation of lower extremity 03/22/2020   Centrilobular emphysema ([place]) 01/12/2020   Senile purpura ([place]) 01/12/2020   Major depression in remission ([place]) 01/12/2020   Dyslipidemia associated with type 2 diabetes mellitus ([place]) 01/12/2020   Bursitis of hip 08/23/2019   Carpal tunnel syndrome 08/23/2019   Thoracic aorta atherosclerosis ([place]) 08/03/2019   Spondylosis of lumbar spine 11/06/2016   Spinal stenosis of lumbar region with neurogenic claudication 10/28/2016   Chronic bilateral low back pain with bilateral sciatica 09/16/2016   Moderate episode of recurrent major depressive disorder ([place]) 08/07/2016   Pain of left heel 06/02/2016   Degenerative cervical disc 03/04/2016   Gastroesophageal reflux disease without esophagitis 02/01/2016   Primary osteoarthritis of right hand 02/01/2016   Paresthesias in left hand 02/01/2016   Asthma-COPD overlap syndrome 10/05/2013   History of smoking 10/05/2013   Hyperlipidemia 10/05/2013   Adult onset hypothyroidism 10/05/2013    Past Surgical History:  Procedure Laterality Date   ABDOMINAL HYSTERECTOMY     CARPAL TUNNEL RELEASE Right    LUMBAR LAMINECTOMY  03/16/2018   L4-5 Laminectomy & Fusion w/ Pedicle Screws, TLIF & Allograft; Surgeon: Alfredia Client, MD; Location: [facility] MAIN OR; Service: Orthopedics; Laterality: N/A; Prone, ProAxis, Stim Neuromonitoring, O-Arm, Cell Saver, Bone Mill, [employer], Justin, PACS on [facility]      OOPHORECTOMY Bilateral    ORIF TIBIA PLATEAU Left 03/10/2016   Procedure: OPEN REDUCTION INTERNAL FIXATION (ORIF) BICONDYLAR TIBIAL PLATEAU FRACTURE;  Surgeon: Marybelle Killings, MD;  Location: [place];  Service: Orthopedics;  Laterality: Left;   SHOULDER ARTHROSCOPY W/ ROTATOR CUFF REPAIR Right    SHOULDER ARTHROSCOPY WITH SUBACROMIAL DECOMPRESSION, ROTATOR CUFF REPAIR AND BICEP TENDON REPAIR Left 05/21/2021   Procedure: SHOULDER ARTHROSCOPY WITH DEBRIDEMENT, DECOMPRESSION, REPAIR OF PARTIAL THICKNESS ROTATOR CUFF REPAIR, BICEPS TENODESIS;  Surgeon: Corky Mull, MD;  Location: [facility] ORS;  Service: Orthopedics;  Laterality: Left;   TONSILLECTOMY AND ADENOIDECTOMY      Family History  Problem Relation Age of Onset   Diabetes Father    Heart disease Father    Depression Sister    Alcohol abuse Sister    Anxiety disorder Sister    Bipolar disorder Sister    Pneumonia Sister    Breast cancer Maternal Aunt 51   Breast cancer Paternal Aunt 52   Hypertension Daughter    Breast cancer Daughter    Skin cancer Daughter    Heart attack Daughter    Anxiety disorder Daughter     Social History   Tobacco Use   Smoking status: Former    Packs/day: 2.00    Years: 30.00    Total pack years: 60.00    Types: Cigarettes    Quit date: 09/03/2008    Years since quitting: 14.0   Smokeless tobacco: Never  Substance Use Topics   Alcohol use: No     Current Outpatient Medications:    albuterol (VENTOLIN HFA) 108 (  90 Base) MCG/ACT inhaler, Inhale 2 puffs into the lungs every 6 (six) hours as needed for wheezing or shortness of breath., Disp: 6.7 g, Rfl: 2   ARIPiprazole (ABILIFY) 2 MG tablet, Take 1 tablet (2 mg total) by mouth daily., Disp: 90 tablet, Rfl: 0   benzonatate (TESSALON) 100 MG capsule, Take 1-2 capsules (100-200 mg total) by mouth 2 (two) times daily as needed., Disp: 40 capsule, Rfl: 0   buPROPion (WELLBUTRIN XL) 150 MG 24 hr tablet, TAKE 1 TABLET(150 MG) BY MOUTH DAILY, Disp: 90 tablet,  Rfl: 0   calcium carbonate (OSCAL) 1500 (600 Ca) MG TABS tablet, Take 1,500 mg by mouth 2 (two) times daily with a meal., Disp: , Rfl:    Cholecalciferol (VITAMIN D PO), Take 1,000 Units by mouth daily. 1000 IU, Disp: , Rfl:    DULoxetine (CYMBALTA) 60 MG capsule, TAKE 1 CAPSULE(60 MG) BY MOUTH DAILY, Disp: 90 capsule, Rfl: 0   etodolac (LODINE) 500 MG tablet, Take 500 mg by mouth 2 (two) times daily., Disp: , Rfl:    fluticasone (FLONASE) 50 MCG/ACT nasal spray, SHAKE LIQUID AND USE 2 SPRAYS IN EACH NOSTRIL DAILY, Disp: 48 g, Rfl: 0   Fluticasone-Umeclidin-Vilant (TRELEGY ELLIPTA) 100-62.5-25 MCG/ACT AEPB, INHALE 1 PUFF INTO THE LUNGS DAILY, Disp: 180 each, Rfl: 0   ipratropium-albuterol (DUONEB) 0.5-2.5 (3) MG/3ML SOLN, Take 3 mLs by nebulization 3 (three) times daily as needed. For asthma/copd exacerbation symptoms (instead of albuterol neb), Disp: 180 mL, Rfl: 1   levothyroxine (SYNTHROID) 75 MCG tablet, Take 1 tablet (75 mcg total) by mouth daily before breakfast., Disp: 30 tablet, Rfl: 0   lidocaine (XYLOCAINE) 5 % ointment, Apply 1 application topically daily as needed (itching)., Disp: , Rfl:    loratadine (CLARITIN) 10 MG tablet, TAKE 1 TABLET(10 MG) BY MOUTH AT BEDTIME, Disp: 90 tablet, Rfl: 1   Melatonin 10 MG TABS, Take 10 mg by mouth at bedtime., Disp: , Rfl:    montelukast (SINGULAIR) 10 MG tablet, Take 1 tablet (10 mg total) by mouth at bedtime., Disp: 90 tablet, Rfl: 1   omeprazole (PRILOSEC) 40 MG capsule, Take 1 capsule (40 mg total) by mouth daily., Disp: 90 capsule, Rfl: 1   predniSONE (DELTASONE) 10 MG tablet, Take 1 tablet (10 mg total) by mouth 2 (two) times daily with a meal., Disp: 10 tablet, Rfl: 0   Propylene Glycol (SYSTANE COMPLETE) 0.6 % SOLN, Place 1 drop into both eyes daily as needed (dry eyes)., Disp: , Rfl:    rosuvastatin (CRESTOR) 20 MG tablet, TAKE 1 TABLET(20 MG) BY MOUTH DAILY, Disp: 90 tablet, Rfl: 1   SUMAtriptan (IMITREX) 50 MG tablet, TAKE 1 TABLET BY  MOUTH ON ONSET OF HEADACHE. MAY REPEAT IN 2 HOURS IF HEADACHE PERSISTS OR REOCURS. MAX 2 TABLETS IN 24 HOURS, Disp: 10 tablet, Rfl: 0   tiZANidine (ZANAFLEX) 4 MG tablet, Take 4 mg by mouth 2 (two) times daily., Disp: , Rfl:    traZODone (DESYREL) 100 MG tablet, TAKE 1 TABLET(100 MG) BY MOUTH AT BEDTIME, Disp: 90 tablet, Rfl: 0   triamcinolone cream (KENALOG) 0.1 %, Apply 1 Application topically 2 (two) times daily as needed., Disp: 80 g, Rfl: 0   valACYclovir (VALTREX) 500 MG tablet, Take 1 tablet (500 mg total) by mouth daily., Disp: 90 tablet, Rfl: 1  No Known Allergies  I personally reviewed active problem list, medication list, allergies, family history, social history, health maintenance with the patient/caregiver today.   ROS  ***  Objective  There were no vitals filed for this visit.  There is no height or weight on file to calculate BMI.  Physical Exam ***   PHQ2/9:    08/13/2022   11:21 AM 08/13/2022   10:29 AM 07/17/2022    9:28 AM 07/17/2022    8:53 AM 05/13/2022    2:03 PM  Depression screen PHQ 2/9  Decreased Interest 0 0 0 0 2  Down, Depressed, Hopeless 0 0 0 0 3  PHQ - 2 Score 0 0 0 0 5  Altered sleeping 1 0   3  Tired, decreased energy 1 0   2  Change in appetite 0 0   0  Feeling bad or failure about yourself  0 0   2  Trouble concentrating 0 0   0  Moving slowly or fidgety/restless 0 0   0  Suicidal thoughts 0 0   0  PHQ-9 Score 2 0   12  Difficult doing work/chores Not difficult at all Not difficult at all   Very difficult    phq 9 is {gen pos [id number]   Fall Risk:    08/13/2022   10:29 AM 07/17/2022    9:28 AM 07/17/2022    8:54 AM 05/13/2022    1:58 PM 04/08/2022    8:40 AM  Fall Risk   Falls in the past year? 0 0 0 0 0  Number falls in past yr: 0 0 0  0  Injury with Fall? 0 0 0  0  Risk for fall due to : No Fall Risks No Fall Risks No Fall Risks No Fall Risks No Fall Risks  Follow up Falls prevention discussed;Education  provided;Falls evaluation completed Falls prevention discussed Falls evaluation completed Falls prevention discussed;Education provided;Falls evaluation completed Falls prevention discussed      Functional Status Survey:      Assessment & Plan  *** There are no diagnoses linked to this encounter.

## 2022-09-08 ENCOUNTER — Encounter: Payer: Self-pay | Admitting: Family Medicine

## 2022-09-08 ENCOUNTER — Ambulatory Visit (INDEPENDENT_AMBULATORY_CARE_PROVIDER_SITE_OTHER): Payer: Medicare HMO | Admitting: Family Medicine

## 2022-09-08 VITALS — BP 120/74 | HR 93 | Resp 16 | Ht 59.0 in | Wt 150.0 lb

## 2022-09-08 DIAGNOSIS — G252 Other specified forms of tremor: Secondary | ICD-10-CM

## 2022-09-08 NOTE — Patient Instructions (Signed)
Intermittent fasting

## 2022-09-17 ENCOUNTER — Telehealth (INDEPENDENT_AMBULATORY_CARE_PROVIDER_SITE_OTHER): Payer: Medicare HMO | Admitting: Physician Assistant

## 2022-09-17 ENCOUNTER — Ambulatory Visit: Payer: Self-pay

## 2022-09-17 DIAGNOSIS — U071 COVID-19: Secondary | ICD-10-CM | POA: Diagnosis not present

## 2022-09-17 MED ORDER — METHYLPREDNISOLONE 4 MG PO TBPK: ORAL_TABLET | ORAL | 0 refills | Status: DC

## 2022-09-17 NOTE — Patient Instructions (Addendum)
You have tested positive for COVID with a home test.  Symptoms can usually last for 3-10 days with lingering cough and intermittent symptoms lasting weeks after that.  The goal of treatment at this time is to reduce your symptoms and discomfort   I have sent in a Medrol dose pack to help with your breathing and coughing. Please follow the directions on the box for dosing instructions  You can use over the counter medications such as Dayquil/Nyquil, AlkaSeltzer formulations, etc to provide further relief of symptoms according to the manufacturer's instructions    If your symptoms do not improve or become worse in the next 5-7 days please make an apt at the office so we can see you  Go to the ER if you begin to have more serious symptoms such as shortness of breath, trouble breathing, loss of consciousness, swelling around the eyes, high fever, severe lasting headaches, vision changes or neck pain/stiffness.

## 2022-09-17 NOTE — Telephone Encounter (Signed)
  Chief Complaint: COVID positive Symptoms: HA, cough, runny nose, body aches Frequency: Monday Pertinent Negatives: Patient denies SOB or fever Disposition: []$ ED /[]$ Urgent Care (no appt availability in office) / []$ Appointment(In office/virtual)/ []$  [facility]/ []$ Home Care/ []$ Refused Recommended Disposition /[]$ [facility]/ []$  Follow-up with PCP Additional Notes: pt states took home test yesterday for COVID. Has had bad HA since Monday, took Imitrex but not helped and cough has got worse as well. Scheduled VV today at 1000 with Junie Panning, [place]. Care advice given and pt verbalized understanding. No other questions/concerns noted.   Summary: covid advice   Pt tested positive for covid / pt is experiencing headaches, runny nose, body aches  and cough / pt asked what she can take to help with this / please advise         Reason for Disposition  [1] Continuous (nonstop) coughing interferes with work or school AND [2] no improvement using cough treatment per Care Advice  Answer Assessment - Initial Assessment Questions 1. COVID-19 DIAGNOSIS: "How do you know that you have COVID?" (e.g., positive lab test or self-test, diagnosed by doctor or NP/PA, symptoms after exposure).     Home test yesterday  3. ONSET: "When did the COVID-19 symptoms start?"      Monday 5. COUGH: "Do you have a cough?" If Yes, ask: "How bad is the cough?"       yes 6. FEVER: "Do you have a fever?" If Yes, ask: "What is your temperature, how was it measured, and when did it start?"     no 7. RESPIRATORY STATUS: "Describe your breathing?" (e.g., normal; shortness of breath, wheezing, unable to speak)      no 9. OTHER SYMPTOMS: "Do you have any other symptoms?"  (e.g., chills, fatigue, headache, loss of smell or taste, muscle pain, sore throat)     HA, runny nose,  cough and body aches  10. HIGH RISK DISEASE: "Do you have any chronic medical problems?" (e.g., asthma, heart or lung disease, weak immune  system, obesity, etc.)       no  Protocols used: Coronavirus (COVID-19) Diagnosed or Suspected-A-AH

## 2022-09-17 NOTE — Progress Notes (Signed)
Virtual Visit via Video Note  I connected with [name] on 09/17/22 Sara 10:00 AM EST by a video enabled telemedicine application and verified that I am speaking with the correct person using two identifiers.  Location: Patient: Sara Andrews Provider: [facility], [place], [place]    I discussed the limitations of evaluation and management by telemedicine and the availability of in person appointments. The patient expressed understanding and agreed to proceed.  Today's Provider: Talitha Givens, MHS, PA-C Introduced myself to the patient as a PA-C and provided education on APPs in clinical practice.    Chief Complaint  Patient presents with   Nasal Congestion   Generalized Body Aches   Headache    Covid positive on Tuesday, sx started on Monday. Daughter was positive last week   Cough    History of Present Illness:    COVID positive   Onset: sudden  Duration: since Monday  Associated symptoms: headaches, dry cough, runny nose and body aches  She denies fevers, chills, SOB, wheezing   Interventions: nothing other than headache pill   COVID testing Sara Andrews: yes, tested positive on Tuesday morning   She has not used her rescue inhaler since getting sick   Review of Systems  Constitutional:  Positive for malaise/fatigue. Negative for chills and fever.  HENT:  Positive for ear pain (Right ear aches). Negative for congestion and sore throat.        Runny nose    Respiratory:  Positive for cough. Negative for sputum production, shortness of breath and wheezing.   Gastrointestinal:  Positive for nausea. Negative for diarrhea and vomiting.  Musculoskeletal:  Positive for myalgias.  Neurological:  Positive for headaches.     Observations/Objective:   Due to the nature of the virtual visit, physical exam and observations are limited. Able to obtain the following observations:   Alert, oriented, Appears comfortable, in no acute distress.  No scleral  injection, no appreciated hoarseness, tachypnea, wheeze or strider. Able to maintain conversation without visible strain.   cough appreciated during visit.      Assessment and Plan:  Problem List Items Addressed This Visit   None Visit Diagnoses     COVID-19    -  Primary Acute, new concern She reports symptoms started on Monday and she has tested positive for COVID with Andrews test on Tues She reports headaches, dry cough, and body aches but has not tried Andrews interventions yet We reviewed that she is in therapeutic window for Paxlovid but due to multiple medication interactions it would be better to try to manage symptomatically Given her previous lung disease hx- will send in script for Medrol dose pack, she declined Tessalon pearl script  We reviewed appropriate use of her rescue inhaler and I encouraged her to continue her daily inhaler regimen Reviewed appropriate OTC medications for symptomatic management- recommend multi-symptom medication Sara this time Reviewed ED and return precautions Follow up as needed    Relevant Medications   methylPREDNISolone (MEDROL DOSEPAK) 4 MG TBPK tablet       Follow Up Instructions:    I discussed the assessment and treatment plan with the patient. The patient was provided an opportunity to ask questions and all were answered. The patient agreed with the plan and demonstrated an understanding of the instructions.   The patient was advised to call back or seek an in-person evaluation if the symptoms worsen or if the condition fails to improve as anticipated.  I provided 12 minutes  of non-face-to-face time during this encounter.  No follow-ups on file.   I, Romie Keeble E Shaquill Iseman, PA-C, have reviewed all documentation for this visit. The documentation on 09/17/22 for the exam, diagnosis, procedures, and orders are all accurate and complete.   Talitha Givens, MHS, PA-C [facility]

## 2022-09-24 DIAGNOSIS — R251 Tremor, unspecified: Secondary | ICD-10-CM | POA: Diagnosis not present

## 2022-09-24 DIAGNOSIS — M79641 Pain in right hand: Secondary | ICD-10-CM | POA: Diagnosis not present

## 2022-09-24 DIAGNOSIS — M79642 Pain in left hand: Secondary | ICD-10-CM | POA: Diagnosis not present

## 2022-09-24 DIAGNOSIS — E538 Deficiency of other specified B group vitamins: Secondary | ICD-10-CM | POA: Diagnosis not present

## 2022-09-26 ENCOUNTER — Other Ambulatory Visit: Payer: Self-pay | Admitting: Family Medicine

## 2022-09-26 DIAGNOSIS — E038 Other specified hypothyroidism: Secondary | ICD-10-CM

## 2022-09-29 DIAGNOSIS — R69 Illness, unspecified: Secondary | ICD-10-CM | POA: Diagnosis not present

## 2022-10-01 ENCOUNTER — Ambulatory Visit: Payer: Medicare HMO | Admitting: Gastroenterology

## 2022-10-01 ENCOUNTER — Encounter: Payer: Self-pay | Admitting: Gastroenterology

## 2022-10-01 ENCOUNTER — Ambulatory Visit: Payer: Medicare Other | Admitting: Gastroenterology

## 2022-10-01 VITALS — BP 120/70 | HR 100 | Temp 98.4°F | Ht 59.0 in | Wt 153.5 lb

## 2022-10-01 DIAGNOSIS — K219 Gastro-esophageal reflux disease without esophagitis: Secondary | ICD-10-CM

## 2022-10-01 NOTE — Progress Notes (Signed)
Cephas Darby, MD [address]  [place]  [place], [place] [address]  Main: [phone]  Fax: [phone]    Gastroenterology Consultation  Referring Provider:     Steele Sizer, MD Primary Care Physician:  Steele Sizer, MD Primary Gastroenterologist:  Dr. Cephas Darby Reason for Consultation: Burning in the chest        HPI:   Sara Andrews is a 67 y.o. female referred by Dr. Steele Sizer, MD  for consultation & management of burning in the chest.  Patient reports several years history of burning in the chest.  She has been taking omeprazole 40 mg daily for a long time and she felt it is no longer working.  She stopped taking it.  She has been experiencing burning in her chest with no particular relation to food and sometimes several hours after meal.  She noticed chocolate triggers heartburn.  She does drink one 12 ounce can of Diet Coke daily since she quit smoking 10 years ago.  She does not drink alcohol regularly.  She denies any difficulty swallowing, epigastric pain.   NSAIDs: None  Antiplts/Anticoagulants/Anti thrombotics: None  GI Procedures: None  Past Medical History:  Diagnosis Date   Anxiety    Arthritis    "hips, hands, back" (03/10/2016)   Asthma    Chronic bronchitis (HCC)    COPD (chronic obstructive pulmonary disease) (HCC)    Cough    Depression    Esophagitis, reflux    GERD (gastroesophageal reflux disease)    Hyperlipidemia    Hypothyroidism    IBS (irritable bowel syndrome)    Lumbago    Muscle pain    Osteoarthritis    Sinus disorder    Thyroid disease    Type 2 diabetes, diet controlled ([place])    Uncomplicated herpes simplex     Past Surgical History:  Procedure Laterality Date   ABDOMINAL HYSTERECTOMY     CARPAL TUNNEL RELEASE Right    LUMBAR LAMINECTOMY  03/16/2018   L4-5 Laminectomy & Fusion w/ Pedicle Screws, TLIF & Allograft; Surgeon: Alfredia Client, MD; Location: [facility] MAIN OR; Service: Orthopedics;  Laterality: N/A; Prone, ProAxis, Stim Neuromonitoring, O-Arm, Cell Saver, Bone Mill, Medtronic, Justin, PACS on [facility]     OOPHORECTOMY Bilateral    ORIF TIBIA PLATEAU Left 03/10/2016   Procedure: OPEN REDUCTION INTERNAL FIXATION (ORIF) BICONDYLAR TIBIAL PLATEAU FRACTURE;  Surgeon: Marybelle Killings, MD;  Location: [facility];  Service: Orthopedics;  Laterality: Left;   SHOULDER ARTHROSCOPY W/ ROTATOR CUFF REPAIR Right    SHOULDER ARTHROSCOPY WITH SUBACROMIAL DECOMPRESSION, ROTATOR CUFF REPAIR AND BICEP TENDON REPAIR Left 05/21/2021   Procedure: SHOULDER ARTHROSCOPY WITH DEBRIDEMENT, DECOMPRESSION, REPAIR OF PARTIAL THICKNESS ROTATOR CUFF REPAIR, BICEPS TENODESIS;  Surgeon: Corky Mull, MD;  Location: [facility] ORS;  Service: Orthopedics;  Laterality: Left;   TONSILLECTOMY AND ADENOIDECTOMY       Current Outpatient Medications:    albuterol (VENTOLIN HFA) 108 (90 Base) MCG/ACT inhaler, Inhale 2 puffs into the lungs every 6 (six) hours as needed for wheezing or shortness of breath., Disp: 6.7 g, Rfl: 2   ARIPiprazole (ABILIFY) 2 MG tablet, Take 1 tablet (2 mg total) by mouth daily., Disp: 90 tablet, Rfl: 0   buPROPion (WELLBUTRIN XL) 150 MG 24 hr tablet, TAKE 1 TABLET(150 MG) BY MOUTH DAILY, Disp: 90 tablet, Rfl: 0   calcium carbonate (OSCAL) 1500 (600 Ca) MG TABS tablet, Take 1,500 mg by mouth 2 (two) times daily with a meal.,  Disp: , Rfl:    Cholecalciferol (VITAMIN D PO), Take 1,000 Units by mouth daily. 1000 IU, Disp: , Rfl:    DULoxetine (CYMBALTA) 60 MG capsule, TAKE 1 CAPSULE(60 MG) BY MOUTH DAILY, Disp: 90 capsule, Rfl: 0   etodolac (LODINE) 500 MG tablet, Take 500 mg by mouth 2 (two) times daily., Disp: , Rfl:    fluticasone (FLONASE) 50 MCG/ACT nasal spray, SHAKE LIQUID AND USE 2 SPRAYS IN EACH NOSTRIL DAILY, Disp: 48 g, Rfl: 0   Fluticasone-Umeclidin-Vilant (TRELEGY ELLIPTA) 100-62.5-25 MCG/ACT AEPB, INHALE 1 PUFF INTO THE LUNGS DAILY, Disp: 180 each, Rfl: 0   gabapentin (NEURONTIN) 300 MG capsule,  Take 300 mg by mouth at bedtime., Disp: , Rfl:    ipratropium-albuterol (DUONEB) 0.5-2.5 (3) MG/3ML SOLN, Take 3 mLs by nebulization 3 (three) times daily as needed. For asthma/copd exacerbation symptoms (instead of albuterol neb), Disp: 180 mL, Rfl: 1   levothyroxine (SYNTHROID) 75 MCG tablet, TAKE 1 TABLET BY MOUTH DAILY BEFORE BREAKFAST., Disp: 90 tablet, Rfl: 0   lidocaine (XYLOCAINE) 5 % ointment, Apply 1 application topically daily as needed (itching)., Disp: , Rfl:    loratadine (CLARITIN) 10 MG tablet, TAKE 1 TABLET(10 MG) BY MOUTH AT BEDTIME, Disp: 90 tablet, Rfl: 1   Melatonin 10 MG TABS, Take 10 mg by mouth at bedtime., Disp: , Rfl:    methocarbamol (ROBAXIN) 500 MG tablet, Take 250-500 mg by mouth 4 (four) times daily as needed for muscle spasms., Disp: , Rfl:    methylPREDNISolone (MEDROL DOSEPAK) 4 MG TBPK tablet, Follow dosing instructions included on box., Disp: 21 each, Rfl: 0   montelukast (SINGULAIR) 10 MG tablet, Take 1 tablet (10 mg total) by mouth at bedtime., Disp: 90 tablet, Rfl: 1   omeprazole (PRILOSEC) 40 MG capsule, Take 1 capsule (40 mg total) by mouth daily., Disp: 90 capsule, Rfl: 1   Propylene Glycol (SYSTANE COMPLETE) 0.6 % SOLN, Place 1 drop into both eyes daily as needed (dry eyes)., Disp: , Rfl:    rosuvastatin (CRESTOR) 20 MG tablet, TAKE 1 TABLET(20 MG) BY MOUTH DAILY, Disp: 90 tablet, Rfl: 1   SUMAtriptan (IMITREX) 50 MG tablet, TAKE 1 TABLET BY MOUTH ON ONSET OF HEADACHE. MAY REPEAT IN 2 HOURS IF HEADACHE PERSISTS OR REOCURS. MAX 2 TABLETS IN 24 HOURS, Disp: 10 tablet, Rfl: 0   tiZANidine (ZANAFLEX) 4 MG tablet, Take 4 mg by mouth 2 (two) times daily., Disp: , Rfl:    traZODone (DESYREL) 100 MG tablet, TAKE 1 TABLET(100 MG) BY MOUTH AT BEDTIME, Disp: 90 tablet, Rfl: 0   triamcinolone cream (KENALOG) 0.1 %, Apply 1 Application topically 2 (two) times daily as needed., Disp: 80 g, Rfl: 0   valACYclovir (VALTREX) 500 MG tablet, Take 1 tablet (500 mg total) by  mouth daily., Disp: 90 tablet, Rfl: 1   Family History  Problem Relation Age of Onset   Diabetes Father    Heart disease Father    Depression Sister    Alcohol abuse Sister    Anxiety disorder Sister    Bipolar disorder Sister    Pneumonia Sister    Breast cancer Maternal Aunt 33   Breast cancer Paternal Aunt 31   Hypertension Daughter    Breast cancer Daughter    Skin cancer Daughter    Heart attack Daughter    Anxiety disorder Daughter      Social History   Tobacco Use   Smoking status: Former    Packs/day: 2.00    Years: 30.00  Total pack years: 60.00    Types: Cigarettes    Quit date: 09/03/2008    Years since quitting: 14.0   Smokeless tobacco: Never  Vaping Use   Vaping Use: Never used  Substance Use Topics   Alcohol use: No   Drug use: No    Allergies as of 10/01/2022   (No Known Allergies)    Review of Systems:    All systems reviewed and negative except where noted in HPI.   Physical Exam:  BP 120/70   Pulse 100   Temp 98.4 F (36.9 C) (Oral)   Ht '4\' 11"'$  (1.499 m)   Wt 153 lb 8 oz (69.6 kg)   BMI 31.00 kg/m  No LMP recorded. Patient has had a hysterectomy.  General:   Alert,  Well-developed, well-nourished, pleasant and cooperative in NAD Head:  Normocephalic and atraumatic. Eyes:  Sclera clear, no icterus.   Conjunctiva pink. Ears:  Normal auditory acuity. Nose:  No deformity, discharge, or lesions. Mouth:  No deformity or lesions,oropharynx pink & moist. Neck:  Supple; no masses or thyromegaly. Lungs:  Respirations even and unlabored.  Clear throughout to auscultation.   No wheezes, crackles, or rhonchi. No acute distress. Heart:  Regular rate and rhythm; no murmurs, clicks, rubs, or gallops. Abdomen:  Normal bowel sounds. Soft, non-tender and non-distended without masses, hepatosplenomegaly or hernias noted.  No guarding or rebound tenderness.   Rectal: Not performed Msk:  Symmetrical without gross deformities. Good, equal movement &  strength bilaterally. Pulses:  Normal pulses noted. Extremities:  No clubbing or edema.  No cyanosis. Neurologic:  Alert and oriented x3;  grossly normal neurologically. Skin:  Intact without significant lesions or rashes. No jaundice. Psych:  Alert and cooperative. Normal mood and affect.  Imaging Studies: Reviewed  Assessment and Plan:   Sara Andrews is a 67 y.o. pleasant female with ex tobacco use, history of COPD, hypertension, hypothyroidism, anxiety, osteoarthritis, chronic GERD is seen in consultation for exacerbation of GERD symptoms  Patient is currently off PPI because she felt it is no longer working Given the duration of symptoms, recommend EGD for further evaluation Discussed about antireflux lifestyle, advised her to cut back on eating chocolate  Patient is undergoing Cologuard test every 3 years and she is not interested in undergoing colonoscopy for colon cancer screening   Follow up based on EGD findings   Cephas Darby, MD

## 2022-10-15 DIAGNOSIS — M810 Age-related osteoporosis without current pathological fracture: Secondary | ICD-10-CM | POA: Diagnosis not present

## 2022-10-16 DIAGNOSIS — M76892 Other specified enthesopathies of left lower limb, excluding foot: Secondary | ICD-10-CM | POA: Diagnosis not present

## 2022-10-22 DIAGNOSIS — E538 Deficiency of other specified B group vitamins: Secondary | ICD-10-CM | POA: Diagnosis not present

## 2022-10-22 DIAGNOSIS — R7989 Other specified abnormal findings of blood chemistry: Secondary | ICD-10-CM | POA: Diagnosis not present

## 2022-10-22 DIAGNOSIS — M79642 Pain in left hand: Secondary | ICD-10-CM | POA: Diagnosis not present

## 2022-10-22 DIAGNOSIS — M79641 Pain in right hand: Secondary | ICD-10-CM | POA: Diagnosis not present

## 2022-10-23 ENCOUNTER — Encounter: Payer: Self-pay | Admitting: Gastroenterology

## 2022-10-24 ENCOUNTER — Encounter
Admission: RE | Disposition: A | Discharge status: Home / Self Care | Payer: Self-pay | Source: Ambulatory Visit | Attending: Gastroenterology

## 2022-10-24 ENCOUNTER — Ambulatory Visit: Payer: Medicare HMO | Admitting: Certified Registered"

## 2022-10-24 ENCOUNTER — Encounter: Payer: Self-pay | Admitting: Gastroenterology

## 2022-10-24 ENCOUNTER — Ambulatory Visit
Admission: RE | Admit: 2022-10-24 | Discharge: 2022-10-24 | Disposition: A | Discharge status: Home / Self Care | Payer: Medicare HMO | Source: Ambulatory Visit | Attending: Gastroenterology | Admitting: Gastroenterology

## 2022-10-24 DIAGNOSIS — I2510 Atherosclerotic heart disease of native coronary artery without angina pectoris: Secondary | ICD-10-CM | POA: Diagnosis not present

## 2022-10-24 DIAGNOSIS — K449 Diaphragmatic hernia without obstruction or gangrene: Secondary | ICD-10-CM

## 2022-10-24 DIAGNOSIS — K219 Gastro-esophageal reflux disease without esophagitis: Secondary | ICD-10-CM | POA: Diagnosis not present

## 2022-10-24 DIAGNOSIS — Z79899 Other long term (current) drug therapy: Secondary | ICD-10-CM | POA: Diagnosis not present

## 2022-10-24 DIAGNOSIS — Z87891 Personal history of nicotine dependence: Secondary | ICD-10-CM | POA: Diagnosis not present

## 2022-10-24 HISTORY — PX: ESOPHAGOGASTRODUODENOSCOPY (EGD) WITH PROPOFOL: SHX5813

## 2022-10-24 HISTORY — DX: Sleep apnea, unspecified: G47.30

## 2022-10-24 SURGERY — ESOPHAGOGASTRODUODENOSCOPY (EGD) WITH PROPOFOL
Anesthesia: General

## 2022-10-24 MED ORDER — GLYCOPYRROLATE 0.2 MG/ML IJ SOLN
INTRAMUSCULAR | Status: DC | PRN
  Administered 2022-10-24: .2 mg via INTRAVENOUS

## 2022-10-24 MED ORDER — SODIUM CHLORIDE 0.9 % IV SOLN: INTRAVENOUS | Status: DC

## 2022-10-24 MED ORDER — PROPOFOL 500 MG/50ML IV EMUL
INTRAVENOUS | Status: DC | PRN
  Administered 2022-10-24: 120 ug/kg/min via INTRAVENOUS

## 2022-10-24 MED ORDER — LIDOCAINE 2% (20 MG/ML) 5 ML SYRINGE
INTRAMUSCULAR | Status: DC | PRN
  Administered 2022-10-24: 20 mg via INTRAVENOUS

## 2022-10-24 MED ORDER — PROPOFOL 10 MG/ML IV BOLUS
INTRAVENOUS | Status: DC | PRN
  Administered 2022-10-24: 70 mg via INTRAVENOUS

## 2022-10-24 NOTE — H&P (Signed)
Cephas Darby, MD [address]  [place]  [place],  [address]  Main: [phone]  Fax: [phone] Pager: [phone]  Primary Care Physician:  Steele Sizer, MD Primary Gastroenterologist:  Dr. Cephas Darby  Pre-Procedure History & Physical: HPI:  Sara Andrews is a 67 y.o. female is here for an endoscopy.   Past Medical History:  Diagnosis Date   Anxiety    Arthritis    "hips, hands, back" (03/10/2016)   Asthma    Chronic bronchitis (HCC)    COPD (chronic obstructive pulmonary disease) (HCC)    Cough    Depression    Esophagitis, reflux    GERD (gastroesophageal reflux disease)    Hyperlipidemia    Hypothyroidism    IBS (irritable bowel syndrome)    Lumbago    Muscle pain    Osteoarthritis    Sinus disorder    Sleep apnea    Thyroid disease    Type 2 diabetes, diet controlled ([place])    Uncomplicated herpes simplex     Past Surgical History:  Procedure Laterality Date   ABDOMINAL HYSTERECTOMY     CARPAL TUNNEL RELEASE Right    COLON SURGERY     FRACTURE SURGERY     LUMBAR LAMINECTOMY  03/16/2018   L4-5 Laminectomy & Fusion w/ Pedicle Screws, TLIF & Allograft; Surgeon: Alfredia Client, MD; Location: [facility] MAIN OR; Service: Orthopedics; Laterality: N/A; Prone, ProAxis, Stim Neuromonitoring, O-Arm, Cell Saver, Bone Mill, Medtronic, Justin, PACS on [facility]     OOPHORECTOMY Bilateral    ORIF TIBIA PLATEAU Left 03/10/2016   Procedure: OPEN REDUCTION INTERNAL FIXATION (ORIF) BICONDYLAR TIBIAL PLATEAU FRACTURE;  Surgeon: Marybelle Killings, MD;  Location: [facility];  Service: Orthopedics;  Laterality: Left;   SHOULDER ARTHROSCOPY W/ ROTATOR CUFF REPAIR Right    SHOULDER ARTHROSCOPY WITH SUBACROMIAL DECOMPRESSION, ROTATOR CUFF REPAIR AND BICEP TENDON REPAIR Left 05/21/2021   Procedure: SHOULDER ARTHROSCOPY WITH DEBRIDEMENT, DECOMPRESSION, REPAIR OF PARTIAL THICKNESS ROTATOR CUFF REPAIR, BICEPS TENODESIS;  Surgeon: Corky Mull, MD;  Location: [facility];   Service: Orthopedics;  Laterality: Left;   TONSILLECTOMY AND ADENOIDECTOMY      Prior to Admission medications   Medication Sig Start Date End Date Taking? Authorizing Provider  ARIPiprazole (ABILIFY) 2 MG tablet Take 1 tablet (2 mg total) by mouth daily. 08/13/22  Yes Sowles, Drue Stager, MD  buPROPion (WELLBUTRIN XL) 150 MG 24 hr tablet TAKE 1 TABLET(150 MG) BY MOUTH DAILY 08/13/22  Yes Sowles, Drue Stager, MD  DULoxetine (CYMBALTA) 60 MG capsule TAKE 1 CAPSULE(60 MG) BY MOUTH DAILY 08/05/22  Yes Sowles, Drue Stager, MD  levothyroxine (SYNTHROID) 75 MCG tablet TAKE 1 TABLET BY MOUTH DAILY BEFORE BREAKFAST. 09/26/22  Yes Teodora Medici, DO  loratadine (CLARITIN) 10 MG tablet TAKE 1 TABLET(10 MG) BY MOUTH AT BEDTIME 08/13/22  Yes Sowles, Drue Stager, MD  Melatonin 10 MG TABS Take 10 mg by mouth at bedtime.   Yes [provider]  montelukast (SINGULAIR) 10 MG tablet Take 1 tablet (10 mg total) by mouth at bedtime. 08/13/22  Yes Sowles, Drue Stager, MD  omeprazole (PRILOSEC) 40 MG capsule Take 1 capsule (40 mg total) by mouth daily. 08/13/22  Yes Sowles, Drue Stager, MD  rosuvastatin (CRESTOR) 20 MG tablet TAKE 1 TABLET(20 MG) BY MOUTH DAILY 08/13/22  Yes Sowles, Drue Stager, MD  tiZANidine (ZANAFLEX) 4 MG tablet Take 4 mg by mouth 2 (two) times daily. 04/16/22  Yes [provider]  traZODone (DESYREL) 100 MG tablet TAKE 1 TABLET(100 MG) BY MOUTH AT  BEDTIME 08/13/22  Yes Sowles, Drue Stager, MD  albuterol (VENTOLIN HFA) 108 (90 Base) MCG/ACT inhaler Inhale 2 puffs into the lungs every 6 (six) hours as needed for wheezing or shortness of breath. 08/13/22   Steele Sizer, MD  calcium carbonate (OSCAL) 1500 (600 Ca) MG TABS tablet Take 1,500 mg by mouth 2 (two) times daily with a meal.    [provider]  Cholecalciferol (VITAMIN D PO) Take 1,000 Units by mouth daily. 1000 IU    [provider]  etodolac (LODINE) 500 MG tablet Take 500 mg by mouth 2 (two) times daily. 05/10/22   [provider]   fluticasone (FLONASE) 50 MCG/ACT nasal spray SHAKE LIQUID AND USE 2 SPRAYS IN EACH NOSTRIL DAILY 05/14/20   Ancil Boozer, Drue Stager, MD  Fluticasone-Umeclidin-Vilant (TRELEGY ELLIPTA) 100-62.5-25 MCG/ACT AEPB INHALE 1 PUFF INTO THE LUNGS DAILY 08/08/22   Steele Sizer, MD  gabapentin (NEURONTIN) 300 MG capsule Take 300 mg by mouth at bedtime.    Sharlet Salina, MD  ipratropium-albuterol (DUONEB) 0.5-2.5 (3) MG/3ML SOLN Take 3 mLs by nebulization 3 (three) times daily as needed. For asthma/copd exacerbation symptoms (instead of albuterol neb) 10/31/20   Delsa Grana, PA-C  lidocaine (XYLOCAINE) 5 % ointment Apply 1 application topically daily as needed (itching).    [provider]  methocarbamol (ROBAXIN) 500 MG tablet Take 250-500 mg by mouth 4 (four) times daily as needed for muscle spasms.    Sharlet Salina, MD  methylPREDNISolone (MEDROL DOSEPAK) 4 MG TBPK tablet Follow dosing instructions included on box. Patient not taking: Reported on 10/24/2022 09/17/22   Mecum, Erin E, PA-C  Propylene Glycol (SYSTANE COMPLETE) 0.6 % SOLN Place 1 drop into both eyes daily as needed (dry eyes).    [provider]  SUMAtriptan (IMITREX) 50 MG tablet TAKE 1 TABLET BY MOUTH ON ONSET OF HEADACHE. MAY REPEAT IN 2 HOURS IF HEADACHE PERSISTS OR REOCURS. MAX 2 TABLETS IN 24 HOURS 08/13/22   Ancil Boozer, Drue Stager, MD  triamcinolone cream (KENALOG) 0.1 % Apply 1 Application topically 2 (two) times daily as needed. 04/08/22   Steele Sizer, MD  valACYclovir (VALTREX) 500 MG tablet Take 1 tablet (500 mg total) by mouth daily. 08/13/22   Steele Sizer, MD    Allergies as of 10/01/2022   (No Known Allergies)    Family History  Problem Relation Age of Onset   Diabetes Father    Heart disease Father    Depression Sister    Alcohol abuse Sister    Anxiety disorder Sister    Bipolar disorder Sister    Pneumonia Sister    Breast cancer Maternal Aunt 42   Breast cancer Paternal Aunt 89   Hypertension  Daughter    Breast cancer Daughter    Skin cancer Daughter    Heart attack Daughter    Anxiety disorder Daughter     Social History   Socioeconomic History   Marital status: Widowed    Spouse name: Not on file   Number of children: 3   Years of education: Not on file   Highest education level: 10th grade  Occupational History   Occupation: disabled    Comment: retired  Tobacco Use   Smoking status: Former    Packs/day: 2.00    Years: 30.00    Additional pack years: 0.00    Total pack years: 60.00    Types: Cigarettes    Quit date: 09/03/2008    Years since quitting: 14.1   Smokeless tobacco: Never  Vaping Use  Vaping Use: Never used  Substance and Sexual Activity   Alcohol use: No   Drug use: No   Sexual activity: Never  Other Topics Concern   Not on file  Social History Narrative   She used to work at  [employer], but had a left knee work related injury 03/10/2016 , require left knee surgery and is now having back pain, that is getting evaluated, Out of work since.       Patient recently found out that one of her twins [name]) was diagnosed with breast cancer); will be having a double mastectomy soon.      Pt lives alone   Social Determinants of Health   Financial Resource Strain: Low Risk  (07/17/2022)   Overall Financial Resource Strain (CARDIA)    Difficulty of Paying Living Expenses: Not hard at all  Food Insecurity: No Food Insecurity (07/17/2022)   Hunger Vital Sign    Worried About Running Out of Food in the Last Year: Never true    Ran Out of Food in the Last Year: Never true  Transportation Needs: No Transportation Needs (07/17/2022)   PRAPARE - Hydrologist (Medical): No    Lack of Transportation (Non-Medical): No  Physical Activity: Inactive (07/17/2022)   Exercise Vital Sign    Days of Exercise per Week: 0 days    Minutes of Exercise per Session: 0 min  Stress: No Stress Concern Present (07/17/2022)   [place]    Feeling of Stress : Only a little  Social Connections: Socially Isolated (07/17/2022)   Social Connection and Isolation Panel [NHANES]    Frequency of Communication with Friends and Family: More than three times a week    Frequency of Social Gatherings with Friends and Family: Never    Attends Religious Services: Never    Marine scientist or Organizations: No    Attends Archivist Meetings: Never    Marital Status: Widowed  Intimate Partner Violence: Not At Risk (07/16/2021)   Humiliation, Afraid, Rape, and Kick questionnaire    Fear of Current or Ex-Partner: No    Emotionally Abused: No    Physically Abused: No    Sexually Abused: No    Review of Systems: See HPI, otherwise negative ROS  Physical Exam: BP 111/66   Pulse 72   Temp (!) 96.3 F (35.7 C) (Temporal)   Resp 16   Ht 4\' 9"  (1.448 m)   Wt 68.5 kg   SpO2 96%   BMI 32.68 kg/m  General:   Alert,  pleasant and cooperative in NAD Head:  Normocephalic and atraumatic. Neck:  Supple; no masses or thyromegaly. Lungs:  Clear throughout to auscultation.    Heart:  Regular rate and rhythm. Abdomen:  Soft, nontender and nondistended. Normal bowel sounds, without guarding, and without rebound.   Neurologic:  Alert and  oriented x4;  grossly normal neurologically.  Impression/Plan: Sara Andrews is here for an endoscopy to be performed for heart burn GERD  Risks, benefits, limitations, and alternatives regarding  endoscopy have been reviewed with the patient.  Questions have been answered.  All parties agreeable.   Sherri Sear, MD  10/24/2022, 7:43 AM

## 2022-10-24 NOTE — Anesthesia Postprocedure Evaluation (Signed)
Anesthesia Post Note  Patient: Sara Andrews  Procedure(s) Performed: ESOPHAGOGASTRODUODENOSCOPY (EGD) WITH PROPOFOL  Patient location during evaluation: Endoscopy Anesthesia Type: General Level of consciousness: awake and alert Pain management: pain level controlled Vital Signs Assessment: post-procedure vital signs reviewed and stable Respiratory status: spontaneous breathing, nonlabored ventilation, respiratory function stable and patient connected to nasal cannula oxygen Cardiovascular status: blood pressure returned to baseline and stable Postop Assessment: no apparent nausea or vomiting Anesthetic complications: no   No notable events documented.   Last Vitals:  Vitals:   10/24/22 0900 10/24/22 0910  BP: 117/77 121/78  Pulse:    Resp:    Temp:    SpO2:      Last Pain:  Vitals:   10/24/22 0910  TempSrc:   PainSc: 0-No pain                 Ilene Qua

## 2022-10-24 NOTE — Op Note (Signed)
[facility] Gastroenterology Patient Name: Sara Andrews Procedure Date: 10/24/2022 8:27 AM MRN: [id number] Account #: [id number] Date of Birth: [birth year] Admit Type: Outpatient Age: 67 Room: [facility] ENDO ROOM 2 Gender: Female Note Status: Finalized Instrument Name: Michaelle Birks [id number] Procedure:             Upper GI endoscopy Indications:           Follow-up of gastro-esophageal reflux disease Providers:             Lin Landsman MD, MD Referring MD:          Bethena Roys. Sowles, MD (Referring MD) Medicines:             General Anesthesia Complications:         No immediate complications. Estimated blood loss: None. Procedure:             Pre-Anesthesia Assessment:                        - Prior to the procedure, a History and Physical was                         performed, and patient medications and allergies were                         reviewed. The patient is competent. The risks and                         benefits of the procedure and the sedation options and                         risks were discussed with the patient. All questions                         were answered and informed consent was obtained.                         Patient identification and proposed procedure were                         verified by the physician, the nurse, the                         anesthesiologist, the anesthetist and the technician                         in the pre-procedure area in the procedure room in the                         endoscopy suite. Mental Status Examination: alert and                         oriented. Airway Examination: normal oropharyngeal                         airway and neck mobility. Respiratory Examination:                         clear to auscultation. CV Examination: normal.  Prophylactic Antibiotics: The patient does not require                         prophylactic antibiotics. Prior Anticoagulants: The                          patient has taken no anticoagulant or antiplatelet                         agents. ASA Grade Assessment: III - A patient with                         severe systemic disease. After reviewing the risks and                         benefits, the patient was deemed in satisfactory                         condition to undergo the procedure. The anesthesia                         plan was to use general anesthesia. Immediately prior                         to administration of medications, the patient was                         re-assessed for adequacy to receive sedatives. The                         heart rate, respiratory rate, oxygen saturations,                         blood pressure, adequacy of pulmonary ventilation, and                         response to care were monitored throughout the                         procedure. The physical status of the patient was                         re-assessed after the procedure.                        After obtaining informed consent, the endoscope was                         passed under direct vision. Throughout the procedure,                         the patient's blood pressure, pulse, and oxygen                         saturations were monitored continuously. The Endoscope                         was introduced through the mouth, and advanced to the  second part of duodenum. The upper GI endoscopy was                         accomplished without difficulty. The patient tolerated                         the procedure well. Findings:      The duodenal bulb and second portion of the duodenum were normal.      A small hiatal hernia was present.      The entire examined stomach was normal.      The cardia and gastric fundus were normal on retroflexion.      Esophagogastric landmarks were identified: the gastroesophageal junction       was found at 30 cm from the incisors.      The gastroesophageal  junction and examined esophagus were normal. Impression:            - Normal duodenal bulb and second portion of the                         duodenum.                        - Small hiatal hernia.                        - Normal stomach.                        - Esophagogastric landmarks identified.                        - Normal gastroesophageal junction and esophagus.                        - No specimens collected. Recommendation:        - Discharge patient to home (with escort).                        - Resume previous diet today.                        - Follow an antireflux regimen indefinitely.                        - Use Prilosec (omeprazole) 20 mg PO daily                         indefinitely. Procedure Code(s):     --- Professional ---                        [phone], Esophagogastroduodenoscopy, flexible,                         transoral; diagnostic, including collection of                         specimen(s) by brushing or washing, when performed                         (separate procedure) Diagnosis Code(s):     --- Professional ---  K44.9, Diaphragmatic hernia without obstruction or                         gangrene                        K21.9, Gastro-esophageal reflux disease without                         esophagitis CPT copyright 2022 [employer]. All rights reserved. The codes documented in this report are preliminary and upon coder review may  be revised to meet current compliance requirements. Dr. Ulyess Mort Lin Landsman MD, MD 10/24/2022 8:42:44 AM This report has been signed electronically. Number of Addenda: 0 Note Initiated On: 10/24/2022 8:27 AM Estimated Blood Loss:  Estimated blood loss: none.      [facility]

## 2022-10-24 NOTE — Anesthesia Preprocedure Evaluation (Signed)
Anesthesia Evaluation  Patient identified by MRN, date of birth, ID band Patient awake    Reviewed: Allergy & Precautions, NPO status , Patient's Chart, lab work & pertinent test results  History of Anesthesia Complications Negative for: history of anesthetic complications  Airway Mallampati: III  TM Distance: >3 FB Neck ROM: full    Dental no notable dental hx.    Pulmonary asthma , sleep apnea , COPD,  COPD inhaler, former smoker   Pulmonary exam normal        Cardiovascular + CAD and + Peripheral Vascular Disease  Normal cardiovascular exam     Neuro/Psych  PSYCHIATRIC DISORDERS Anxiety Depression     Neuromuscular disease    GI/Hepatic Neg liver ROS,GERD  Medicated,,  Endo/Other  diabetesHypothyroidism    Renal/GU negative Renal ROS  negative genitourinary   Musculoskeletal  (+) Arthritis ,    Abdominal   Peds  Hematology negative hematology ROS (+)   Anesthesia Other Findings Past Medical History: No date: Anxiety No date: Arthritis     Comment:  "hips, hands, back" (03/10/2016) No date: Asthma No date: Chronic bronchitis (HCC) No date: COPD (chronic obstructive pulmonary disease) (HCC) No date: Cough No date: Depression No date: Esophagitis, reflux No date: GERD (gastroesophageal reflux disease) No date: Hyperlipidemia No date: Hypothyroidism No date: IBS (irritable bowel syndrome) No date: Lumbago No date: Muscle pain No date: Osteoarthritis No date: Sinus disorder No date: Sleep apnea No date: Thyroid disease No date: Type 2 diabetes, diet controlled ([place]) No date: Uncomplicated herpes simplex  Past Surgical History: No date: ABDOMINAL HYSTERECTOMY No date: CARPAL TUNNEL RELEASE; Right No date: COLON SURGERY No date: FRACTURE SURGERY 03/16/2018: LUMBAR LAMINECTOMY     Comment:  L4-5 Laminectomy & Fusion w/ Pedicle Screws, TLIF &               Allograft; Surgeon: Alfredia Client, MD;  Location: [facility]              MAIN OR; Service: Orthopedics; Laterality: N/A; Prone,               ProAxis, Stim Neuromonitoring, O-Arm, Cell Saver, Bone               Mill, [employer], Justin, PACS on [facility]   No date: OOPHORECTOMY; Bilateral 03/10/2016: ORIF TIBIA PLATEAU; Left     Comment:  Procedure: OPEN REDUCTION INTERNAL FIXATION (ORIF)               BICONDYLAR TIBIAL PLATEAU FRACTURE;  Surgeon: Marybelle Killings, MD;  Location: [place];  Service: Orthopedics;                Laterality: Left; No date: SHOULDER ARTHROSCOPY W/ ROTATOR CUFF REPAIR; Right 05/21/2021: SHOULDER ARTHROSCOPY WITH SUBACROMIAL DECOMPRESSION,  ROTATOR CUFF REPAIR AND BICEP TENDON REPAIR; Left     Comment:  Procedure: SHOULDER ARTHROSCOPY WITH DEBRIDEMENT,               DECOMPRESSION, REPAIR OF PARTIAL THICKNESS ROTATOR CUFF               REPAIR, BICEPS TENODESIS;  Surgeon: Corky Mull, MD;                Location: [facility] ORS;  Service: Orthopedics;  Laterality:               Left; No date: TONSILLECTOMY AND ADENOIDECTOMY  BMI  Body Mass Index: 32.68 kg/m      Reproductive/Obstetrics negative OB ROS                             Anesthesia Physical Anesthesia Plan  ASA: 3  Anesthesia Plan: General   Post-op Pain Management:    Induction: Intravenous  PONV Risk Score and Plan: Propofol infusion and TIVA  Airway Management Planned: Natural Airway and Nasal Cannula  Additional Equipment:   Intra-op Plan:   Post-operative Plan:   Informed Consent: I have reviewed the patients History and Physical, chart, labs and discussed the procedure including the risks, benefits and alternatives for the proposed anesthesia with the patient or authorized representative who has indicated his/her understanding and acceptance.     Dental Advisory Given  Plan Discussed with: Anesthesiologist, CRNA and Surgeon  Anesthesia Plan Comments: (Patient consented for risks of anesthesia  including but not limited to:  - adverse reactions to medications - risk of airway placement if required - damage to eyes, teeth, lips or other oral mucosa - nerve damage due to positioning  - sore throat or hoarseness - Damage to heart, brain, nerves, lungs, other parts of body or loss of life  Patient voiced understanding.)        Anesthesia Quick Evaluation

## 2022-10-24 NOTE — Transfer of Care (Signed)
Immediate Anesthesia Transfer of Care Note  Patient: Sara Andrews  Procedure(s) Performed: ESOPHAGOGASTRODUODENOSCOPY (EGD) WITH PROPOFOL  Patient Location: Endoscopy Unit  Anesthesia Type:General  Level of Consciousness: drowsy  Airway & Oxygen Therapy: Patient Spontanous Breathing  Post-op Assessment: Report given to RN and Post -op Vital signs reviewed and stable  Post vital signs: Reviewed  Last Vitals:  Vitals Value Taken Time  BP 108/65   Temp    Pulse    Resp 16   SpO2 100     Last Pain:  Vitals:   10/24/22 0734  TempSrc: Temporal  PainSc: 0-No pain         Complications: No notable events documented.

## 2022-10-27 ENCOUNTER — Encounter: Payer: Self-pay | Admitting: Gastroenterology

## 2022-10-31 ENCOUNTER — Telehealth: Payer: Self-pay | Admitting: Family Medicine

## 2022-10-31 NOTE — Telephone Encounter (Unsigned)
Copied from CRM [phone]. Topic: General - Other >> Oct 31, 2022 12:25 PM Ja-Kwan M wrote: Reason for CRM: Pt stated that she requested a list of medications that she can provide to her insurance to help her lose weight but she never received a call back. Cb# [phone]

## 2022-11-01 ENCOUNTER — Other Ambulatory Visit: Payer: Self-pay | Admitting: Family Medicine

## 2022-11-01 DIAGNOSIS — F325 Major depressive disorder, single episode, in full remission: Secondary | ICD-10-CM

## 2022-11-01 DIAGNOSIS — J4489 Other specified chronic obstructive pulmonary disease: Secondary | ICD-10-CM

## 2022-11-03 NOTE — Telephone Encounter (Signed)
Spoke with patient, gave her a list of medications to consult with her insurance for coverage. She will get back with [place].

## 2022-11-08 ENCOUNTER — Other Ambulatory Visit: Payer: Self-pay | Admitting: Family Medicine

## 2022-11-08 DIAGNOSIS — E038 Other specified hypothyroidism: Secondary | ICD-10-CM

## 2022-11-08 DIAGNOSIS — F339 Major depressive disorder, recurrent, unspecified: Secondary | ICD-10-CM

## 2022-11-10 ENCOUNTER — Other Ambulatory Visit: Payer: Self-pay

## 2022-11-10 DIAGNOSIS — F339 Major depressive disorder, recurrent, unspecified: Secondary | ICD-10-CM

## 2022-11-10 DIAGNOSIS — E038 Other specified hypothyroidism: Secondary | ICD-10-CM

## 2022-11-10 MED ORDER — BUPROPION HCL ER (XL) 150 MG PO TB24: ORAL_TABLET | ORAL | 0 refills | Status: DC

## 2022-11-10 MED ORDER — ARIPIPRAZOLE 2 MG PO TABS: 2.0000 mg | ORAL_TABLET | Freq: Every day | ORAL | 0 refills | Status: DC

## 2022-11-12 NOTE — Progress Notes (Unsigned)
Name: Sara Andrews   MRN: [id number]    DOB: July 23, 1956   Date:11/13/2022       Progress Note  Subjective  Chief Complaint  Follow Up  HPI  Major Depression:  no longer seeing Dr. Elna Breslow but taking medication as prescribed - Duloxetine and Wellbutrin during the day and Trazodone at night, she feels anxious at night and keeps scratching her head ( she feels it is her nerves), we tried to switched from Trazodone to Seroquel but she kept taking both medication. She states she is feeling a little better but still wants to see a psychiatrist, currently talking to a therapist through her insurance and it has helped. I referred her back to psychiatrist, Dr. Elna Breslow dismissed her from her practice due to lack of follow up , Dr. Maryruth Bun is not in network and one of the facilities only sees patients younger than 52, we sent her to [facility] but she has not heard back from them. She states she wants to be referred to a local psychiatrist since she has a new insurance.   Recurrent headaches: doing well on prn imitrex and zofran, she states not as frequent, she described pain as throbbing, photophobia, eye pain during episodes. Episodes are seldom   DDD and spondylolisthesis/Neck pain : doing well since surgery done 02/2018. She is off pain  medication, taking prn tylenol only and since pain is under control, she used to see Dr. Noel Gerold, now seeing Dr. Council Mechanic, she had some steroid injections, hand tremors improved with B12 supplementation.   RLS: only at night, she has to stand up and walk around and it improves, she is taking magnesium. Requip made symptoms worse. Unchanged   Emphysema : she takes Trelegy daily, she has a cough, it is wet, she states strong scents makes it worse, it has been worse over the past week, no increase in SOB, or wheezing. No fever or chills.   Recurrent scalp pruritis: resolves since started taking Valtrex every night, she wants to continue taking it, otherwise she has outbreaks  Sometimes she takes it twice daily    GERD/hiatal hernia: she is no longer taking Ozempic, able to tolerate lower dose PPI, Dr. Allegra Lai advised to stay on it for the rest of her life. Indigestion is better   Hypothyroidism: She is currently taking 75 mcg daily and half on Mondays, last TSH was at goal .Denies change in bowel movements , her hair is thinning out. Continue medication    DM II : diagnosed at work back in 2012 she used to take medications  She denies polyphagia, polyuria , she has polydipsia.  Started on Trulicity June 2018, but she was having worsening of constipation and indigestion, so we switched to Ozempic 04/2017 weight was 198 lbs and went  down to 132.4  lbs. She was of Ozempic since January 21 and weight went up to 160 lbs, we stopped medication due to bloating and nausea. She has been off medication and A1C is still at goal, weight is trending up . A1C is still normal, she is frustrated about her weight and is willing to try Metformin     Osteoporosis: she had first Reclast injection January 2023 and two weeks later developed bilateral shoulder pain , she is due for repeat bone density she is waiting for an appointment with Dr. Gershon Crane   Chest pain: intermittently and states not as often now,  she is under the care of  cardiologist. Dr. Azucena Cecil and had EKG and echo  that showed normal EF . CT lung had already shown some coronary calcification. She is compliant with statin therapy   Unchanged   Senile purpura: reassurance given. Both arms and legs  Unchanged    Atherosclerosis Aorta: found on CT done 03/20/2020, on statin therapy , no side effects of medication , last LDL was at goal.   B12 deficiency: she is now taking SL B12 and level improved, she states tremors have improved .  Patient Active Problem List   Diagnosis Date Noted   Hiatal hernia 10/24/2022   Chronic GERD 10/24/2022   Spasms of the hands or feet 01/10/2022   Insomnia due to mental disorder 01/10/2022    Tendinitis of upper biceps tendon of left shoulder 05/22/2021   Rotator cuff tendinitis, left 05/22/2021   Nontraumatic incomplete tear of left rotator cuff 05/22/2021   History of shingles 03/23/2020   Burning sensation of lower extremity 03/22/2020   Centrilobular emphysema 01/12/2020   Senile purpura 01/12/2020   Major depression in remission 01/12/2020   Dyslipidemia associated with type 2 diabetes mellitus 01/12/2020   Bursitis of hip 08/23/2019   Carpal tunnel syndrome 08/23/2019   Thoracic aorta atherosclerosis 08/03/2019   Spondylosis of lumbar spine 11/06/2016   Spinal stenosis of lumbar region with neurogenic claudication 10/28/2016   Chronic bilateral low back pain with bilateral sciatica 09/16/2016   Moderate episode of recurrent major depressive disorder 08/07/2016   Pain of left heel 06/02/2016   Degenerative cervical disc 03/04/2016   Gastroesophageal reflux disease without esophagitis 02/01/2016   Primary osteoarthritis of right hand 02/01/2016   Paresthesias in left hand 02/01/2016   Asthma-COPD overlap syndrome 10/05/2013   History of smoking 10/05/2013   Hyperlipidemia 10/05/2013   Adult onset hypothyroidism 10/05/2013    Past Surgical History:  Procedure Laterality Date   ABDOMINAL HYSTERECTOMY     CARPAL TUNNEL RELEASE Right    COLON SURGERY     ESOPHAGOGASTRODUODENOSCOPY (EGD) WITH PROPOFOL N/A 10/24/2022   Procedure: ESOPHAGOGASTRODUODENOSCOPY (EGD) WITH PROPOFOL;  Surgeon: Toney Reil, MD;  Location: [facility] ENDOSCOPY;  Service: Gastroenterology;  Laterality: N/A;   FRACTURE SURGERY     LUMBAR LAMINECTOMY  03/16/2018   L4-5 Laminectomy & Fusion w/ Pedicle Screws, TLIF & Allograft; Surgeon: Virl Diamond, MD; Location: [facility] MAIN OR; Service: Orthopedics; Laterality: N/A; Prone, ProAxis, Stim Neuromonitoring, O-Arm, Cell Saver, [employer], [employer], Justin, PACS on [facility]     OOPHORECTOMY Bilateral    ORIF TIBIA PLATEAU Left 03/10/2016    Procedure: OPEN REDUCTION INTERNAL FIXATION (ORIF) BICONDYLAR TIBIAL PLATEAU FRACTURE;  Surgeon: Eldred Manges, MD;  Location: [facility] OR;  Service: Orthopedics;  Laterality: Left;   SHOULDER ARTHROSCOPY W/ ROTATOR CUFF REPAIR Right    SHOULDER ARTHROSCOPY WITH SUBACROMIAL DECOMPRESSION, ROTATOR CUFF REPAIR AND BICEP TENDON REPAIR Left 05/21/2021   Procedure: SHOULDER ARTHROSCOPY WITH DEBRIDEMENT, DECOMPRESSION, REPAIR OF PARTIAL THICKNESS ROTATOR CUFF REPAIR, BICEPS TENODESIS;  Surgeon: Christena Flake, MD;  Location: [facility] ORS;  Service: Orthopedics;  Laterality: Left;   TONSILLECTOMY AND ADENOIDECTOMY      Family History  Problem Relation Age of Onset   Diabetes Father    Heart disease Father    Depression Sister    Alcohol abuse Sister    Anxiety disorder Sister    Bipolar disorder Sister    Pneumonia Sister    Breast cancer Maternal Aunt 42   Breast cancer Paternal Aunt 34   Hypertension Daughter    Breast cancer Daughter    Skin cancer Daughter  Heart attack Daughter    Anxiety disorder Daughter     Social History   Tobacco Use   Smoking status: Former    Packs/day: 2.00    Years: 30.00    Additional pack years: 0.00    Total pack years: 60.00    Types: Cigarettes    Quit date: 09/03/2008    Years since quitting: 14.2   Smokeless tobacco: Never  Substance Use Topics   Alcohol use: No     Current Outpatient Medications:    albuterol (VENTOLIN HFA) 108 (90 Base) MCG/ACT inhaler, Inhale 2 puffs into the lungs every 6 (six) hours as needed for wheezing or shortness of breath., Disp: 6.7 g, Rfl: 2   ARIPiprazole (ABILIFY) 2 MG tablet, Take 1 tablet (2 mg total) by mouth daily., Disp: 90 tablet, Rfl: 0   buPROPion (WELLBUTRIN XL) 150 MG 24 hr tablet, TAKE 1 TABLET(150 MG) BY MOUTH DAILY, Disp: 90 tablet, Rfl: 0   calcium carbonate (OSCAL) 1500 (600 Ca) MG TABS tablet, Take 1,500 mg by mouth 2 (two) times daily with a meal., Disp: , Rfl:    Cholecalciferol (VITAMIN D PO), Take  1,000 Units by mouth daily. 1000 IU, Disp: , Rfl:    cyanocobalamin (VITAMIN B12) 1000 MCG tablet, Take 1,000 mcg by mouth daily., Disp: , Rfl:    DULoxetine (CYMBALTA) 60 MG capsule, TAKE 1 CAPSULE(60 MG) BY MOUTH DAILY, Disp: 90 capsule, Rfl: 0   etodolac (LODINE) 500 MG tablet, Take 500 mg by mouth 2 (two) times daily., Disp: , Rfl:    gabapentin (NEURONTIN) 300 MG capsule, Take 300 mg by mouth at bedtime., Disp: , Rfl:    ipratropium-albuterol (DUONEB) 0.5-2.5 (3) MG/3ML SOLN, Take 3 mLs by nebulization 3 (three) times daily as needed. For asthma/copd exacerbation symptoms (instead of albuterol neb), Disp: 180 mL, Rfl: 1   lidocaine (XYLOCAINE) 5 % ointment, Apply 1 application topically daily as needed (itching)., Disp: , Rfl:    loratadine (CLARITIN) 10 MG tablet, TAKE 1 TABLET(10 MG) BY MOUTH AT BEDTIME, Disp: 90 tablet, Rfl: 0   Melatonin 10 MG TABS, Take 10 mg by mouth at bedtime., Disp: , Rfl:    montelukast (SINGULAIR) 10 MG tablet, Take 1 tablet (10 mg total) by mouth at bedtime., Disp: 90 tablet, Rfl: 1   omeprazole (PRILOSEC) 40 MG capsule, Take 1 capsule (40 mg total) by mouth daily., Disp: 90 capsule, Rfl: 1   Propylene Glycol (SYSTANE COMPLETE) 0.6 % SOLN, Place 1 drop into both eyes daily as needed (dry eyes)., Disp: , Rfl:    rosuvastatin (CRESTOR) 20 MG tablet, TAKE 1 TABLET(20 MG) BY MOUTH DAILY, Disp: 90 tablet, Rfl: 1   SUMAtriptan (IMITREX) 50 MG tablet, TAKE 1 TABLET BY MOUTH ON ONSET OF HEADACHE. MAY REPEAT IN 2 HOURS IF HEADACHE PERSISTS OR REOCURS. MAX 2 TABLETS IN 24 HOURS, Disp: 10 tablet, Rfl: 0   triamcinolone cream (KENALOG) 0.1 %, Apply 1 Application topically 2 (two) times daily as needed., Disp: 80 g, Rfl: 0   valACYclovir (VALTREX) 500 MG tablet, Take 1 tablet (500 mg total) by mouth daily., Disp: 90 tablet, Rfl: 1   fluticasone (FLONASE) 50 MCG/ACT nasal spray, SHAKE LIQUID AND USE 2 SPRAYS IN EACH NOSTRIL DAILY (Patient not taking: Reported on 11/13/2022), Disp:  48 g, Rfl: 0   Fluticasone-Umeclidin-Vilant (TRELEGY ELLIPTA) 100-62.5-25 MCG/ACT AEPB, Inhale 1 puff into the lungs daily., Disp: 180 each, Rfl: 1   levothyroxine (SYNTHROID) 75 MCG tablet, Take 1 tablet (75  mcg total) by mouth daily before breakfast., Disp: 90 tablet, Rfl: 1   traZODone (DESYREL) 100 MG tablet, TAKE 1 TABLET(100 MG) BY MOUTH AT BEDTIME, Disp: 90 tablet, Rfl: 0  No Known Allergies  I personally reviewed active problem list, medication list, allergies, family history, social history, health maintenance with the patient/caregiver today.   ROS  Constitutional: Negative for fever or weight change.  Respiratory: Negative for cough and shortness of breath.   Cardiovascular: Negative for chest pain or palpitations.  Gastrointestinal: Negative for abdominal pain, no bowel changes.  Musculoskeletal: Negative for gait problem or joint swelling.  Skin: Negative for rash.  Neurological: Negative for dizziness or headache.  No other specific complaints in a complete review of systems (except as listed in HPI above).   Objective  Vitals:   11/13/22 0824  BP: 112/68  Pulse: 90  Resp: 16  SpO2: 96%  Weight: 155 lb (70.3 kg)  Height: 4\' 11"  (1.499 m)    Body mass index is 31.31 kg/m.  Physical Exam  Constitutional: Patient appears well-developed and well-nourished. Obese  No distress.  HEENT: head atraumatic, normocephalic, pupils equal and reactive to light, neck supple Cardiovascular: Normal rate, regular rhythm and normal heart sounds.  No murmur heard. No BLE edema. Pulmonary/Chest: Effort normal and breath sounds normal. No respiratory distress. Abdominal: Soft.  There is no tenderness. Psychiatric: Patient has a normal mood and affect. behavior is normal. Judgment and thought content normal.   Recent Results (from the past 2160 hour(s))  POCT HgB A1C     Status: None   Collection Time: 11/13/22  8:27 AM  Result Value Ref Range   Hemoglobin A1C 5.4 4.0 - 5.6 %    HbA1c POC (<> result, manual entry)     HbA1c, POC (prediabetic range)     HbA1c, POC (controlled diabetic range)     Diabetic Foot Exam - Simple   Simple Foot Form Visual Inspection See comments: Yes Sensation Testing Intact to touch and monofilament testing bilaterally: Yes Pulse Check Posterior Tibialis and Dorsalis pulse intact bilaterally: Yes Comments Base of nail of 1st left great toe shows damage - likely from trauma or paronychia.       PHQ2/9:    11/13/2022    8:25 AM 09/17/2022    9:50 AM 09/08/2022    8:59 AM 08/13/2022   11:21 AM 08/13/2022   10:29 AM  Depression screen PHQ 2/9  Decreased Interest 2 0 0 0 0  Down, Depressed, Hopeless 1 0 0 0 0  PHQ - 2 Score 3 0 0 0 0  Altered sleeping 0 1 3 1  0  Tired, decreased energy 0 0 0 1 0  Change in appetite 0 0 0 0 0  Feeling bad or failure about yourself  0 0 0 0 0  Trouble concentrating 0 0 0 0 0  Moving slowly or fidgety/restless 0 0 0 0 0  Suicidal thoughts 0 0 0 0 0  PHQ-9 Score 3 1 3 2  0  Difficult doing work/chores  Not difficult at all  Not difficult at all Not difficult at all    phq 9 is positive   Fall Risk:    11/13/2022    8:24 AM 09/17/2022    9:49 AM 09/08/2022    8:51 AM 08/13/2022   10:29 AM 07/17/2022    9:28 AM  Fall Risk   Falls in the past year? 0 0 0 0 0  Number falls in past yr: 0 0  0 0 0  Injury with Fall? 0 0 0 0 0  Risk for fall due to : No Fall Risks No Fall Risks No Fall Risks No Fall Risks No Fall Risks  Follow up Falls prevention discussed Falls prevention discussed;Education provided;Falls evaluation completed Falls prevention discussed Falls prevention discussed;Education provided;Falls evaluation completed Falls prevention discussed      Functional Status Survey: Is the patient deaf or have difficulty hearing?: Yes Does the patient have difficulty seeing, even when wearing glasses/contacts?: Yes Does the patient have difficulty concentrating, remembering, or making  decisions?: Yes Does the patient have difficulty walking or climbing stairs?: No Does the patient have difficulty dressing or bathing?: Yes Does the patient have difficulty doing errands alone such as visiting a doctor's office or shopping?: Yes    Assessment & Plan  1. Dyslipidemia associated with type 2 diabetes mellitus  - POCT HgB A1C  2. MDD (major depressive disorder), recurrent episode, moderate  - Ambulatory referral to Psychiatry  3. Centrilobular emphysema  - Fluticasone-Umeclidin-Vilant (TRELEGY ELLIPTA) 100-62.5-25 MCG/ACT AEPB; Inhale 1 puff into the lungs daily.  Dispense: 180 each; Refill: 1  4. Adult onset hypothyroidism  - levothyroxine (SYNTHROID) 75 MCG tablet; Take 1 tablet (75 mcg total) by mouth daily before breakfast.  Dispense: 90 tablet; Refill: 1  5. Simple Chronic Bronchitis  Taking Trelegy and doing well  6. Insomnia due to mental disorder  - traZODone (DESYREL) 100 MG tablet; TAKE 1 TABLET(100 MG) BY MOUTH AT BEDTIME  Dispense: 90 tablet; Refill: 0  7. Recurrent type 1 herpes simplex of other site  Taking valtrex    8. Gastroesophageal reflux disease without esophagitis  Continue PPI  9. Thoracic aorta atherosclerosis  On statin therapy   10. B12 deficiency  Continue B12 supplementation, consider adding folic acid  11. Age-related osteoporosis without current pathological fracture  Needs to follow up with Dr. Gershon Crane 12. Migraine without aura and without status migrainosus, not intractable   13. Senile purpura

## 2022-11-13 ENCOUNTER — Ambulatory Visit (INDEPENDENT_AMBULATORY_CARE_PROVIDER_SITE_OTHER): Payer: Medicare HMO | Admitting: Family Medicine

## 2022-11-13 ENCOUNTER — Encounter: Payer: Self-pay | Admitting: Family Medicine

## 2022-11-13 VITALS — BP 112/68 | HR 90 | Resp 16 | Ht 59.0 in | Wt 155.0 lb

## 2022-11-13 DIAGNOSIS — R69 Illness, unspecified: Secondary | ICD-10-CM | POA: Diagnosis not present

## 2022-11-13 DIAGNOSIS — E538 Deficiency of other specified B group vitamins: Secondary | ICD-10-CM

## 2022-11-13 DIAGNOSIS — B009 Herpesviral infection, unspecified: Secondary | ICD-10-CM

## 2022-11-13 DIAGNOSIS — F5105 Insomnia due to other mental disorder: Secondary | ICD-10-CM

## 2022-11-13 DIAGNOSIS — I700 Atherosclerosis of aorta: Secondary | ICD-10-CM

## 2022-11-13 DIAGNOSIS — E1169 Type 2 diabetes mellitus with other specified complication: Secondary | ICD-10-CM

## 2022-11-13 DIAGNOSIS — F331 Major depressive disorder, recurrent, moderate: Secondary | ICD-10-CM

## 2022-11-13 DIAGNOSIS — L608 Other nail disorders: Secondary | ICD-10-CM

## 2022-11-13 DIAGNOSIS — M810 Age-related osteoporosis without current pathological fracture: Secondary | ICD-10-CM

## 2022-11-13 DIAGNOSIS — J432 Centrilobular emphysema: Secondary | ICD-10-CM | POA: Diagnosis not present

## 2022-11-13 DIAGNOSIS — E785 Hyperlipidemia, unspecified: Secondary | ICD-10-CM

## 2022-11-13 DIAGNOSIS — E038 Other specified hypothyroidism: Secondary | ICD-10-CM | POA: Diagnosis not present

## 2022-11-13 DIAGNOSIS — K219 Gastro-esophageal reflux disease without esophagitis: Secondary | ICD-10-CM

## 2022-11-13 DIAGNOSIS — D692 Other nonthrombocytopenic purpura: Secondary | ICD-10-CM

## 2022-11-13 DIAGNOSIS — G43009 Migraine without aura, not intractable, without status migrainosus: Secondary | ICD-10-CM | POA: Diagnosis not present

## 2022-11-13 LAB — POCT GLYCOSYLATED HEMOGLOBIN (HGB A1C): Hemoglobin A1C: 5.4 % (ref 4.0–5.6)

## 2022-11-13 MED ORDER — METFORMIN HCL ER 500 MG PO TB24: 500.0000 mg | ORAL_TABLET | Freq: Every day | ORAL | 1 refills | Status: DC

## 2022-11-13 MED ORDER — LEVOTHYROXINE SODIUM 75 MCG PO TABS: 75.0000 ug | ORAL_TABLET | Freq: Every day | ORAL | 1 refills | Status: DC

## 2022-11-13 MED ORDER — TRELEGY ELLIPTA 100-62.5-25 MCG/ACT IN AEPB: 1.0000 | INHALATION_SPRAY | Freq: Every day | RESPIRATORY_TRACT | 1 refills | Status: DC

## 2022-11-13 MED ORDER — TRAZODONE HCL 100 MG PO TABS: ORAL_TABLET | ORAL | 0 refills | Status: DC

## 2022-11-13 MED ORDER — BENZONATATE 100 MG PO CAPS: 100.0000 mg | ORAL_CAPSULE | Freq: Three times a day (TID) | ORAL | 0 refills | Status: DC | PRN

## 2022-11-13 NOTE — Patient Instructions (Addendum)
Intermittent fasting - research  Add folic acid 1000 g per day

## 2022-11-17 ENCOUNTER — Telehealth: Payer: Self-pay | Admitting: Family Medicine

## 2022-11-17 NOTE — Telephone Encounter (Signed)
Copied from CRM [id number]. Topic: General - Inquiry >> Nov 17, 2022 10:10 AM De Blanch wrote: Reason for [id number] from Dr. Maryruth Bun stated they received a referral and are not in network with the patient's insurance.  Stated in the network with [employer] but not the HMO plan. Please advise.

## 2022-11-19 ENCOUNTER — Other Ambulatory Visit: Payer: Self-pay | Admitting: Family Medicine

## 2022-11-19 DIAGNOSIS — F331 Major depressive disorder, recurrent, moderate: Secondary | ICD-10-CM

## 2022-11-19 DIAGNOSIS — F5105 Insomnia due to other mental disorder: Secondary | ICD-10-CM

## 2022-12-02 DIAGNOSIS — Z03818 Encounter for observation for suspected exposure to other biological agents ruled out: Secondary | ICD-10-CM | POA: Diagnosis not present

## 2022-12-02 DIAGNOSIS — B9689 Other specified bacterial agents as the cause of diseases classified elsewhere: Secondary | ICD-10-CM | POA: Diagnosis not present

## 2022-12-02 DIAGNOSIS — J329 Chronic sinusitis, unspecified: Secondary | ICD-10-CM | POA: Diagnosis not present

## 2022-12-02 DIAGNOSIS — R0789 Other chest pain: Secondary | ICD-10-CM | POA: Diagnosis not present

## 2022-12-02 DIAGNOSIS — J22 Unspecified acute lower respiratory infection: Secondary | ICD-10-CM | POA: Diagnosis not present

## 2022-12-02 DIAGNOSIS — J209 Acute bronchitis, unspecified: Secondary | ICD-10-CM | POA: Diagnosis not present

## 2022-12-02 DIAGNOSIS — R059 Cough, unspecified: Secondary | ICD-10-CM | POA: Diagnosis not present

## 2022-12-02 DIAGNOSIS — R0602 Shortness of breath: Secondary | ICD-10-CM | POA: Diagnosis not present

## 2022-12-03 DIAGNOSIS — M5412 Radiculopathy, cervical region: Secondary | ICD-10-CM | POA: Diagnosis not present

## 2022-12-03 DIAGNOSIS — M4802 Spinal stenosis, cervical region: Secondary | ICD-10-CM | POA: Diagnosis not present

## 2022-12-03 DIAGNOSIS — M5030 Other cervical disc degeneration, unspecified cervical region: Secondary | ICD-10-CM | POA: Diagnosis not present

## 2022-12-08 ENCOUNTER — Ambulatory Visit: Payer: Medicare HMO | Admitting: Podiatry

## 2022-12-08 DIAGNOSIS — L601 Onycholysis: Secondary | ICD-10-CM

## 2022-12-08 DIAGNOSIS — L603 Nail dystrophy: Secondary | ICD-10-CM | POA: Diagnosis not present

## 2022-12-08 NOTE — Progress Notes (Signed)
  Subjective:  Patient ID: Sara Andrews, female    DOB: [birth year],  MRN: [id number]  Chief Complaint  Patient presents with   Nail Problem    new pt-left toe nail has a deformity near the bed of the nail, first toe-non req-Dr. Launa Flight refer    67 y.o. female presents with the above complaint. History confirmed with patient.   Objective:  Physical Exam: warm, good capillary refill, no trophic changes or ulcerative lesions, normal DP and PT pulses, normal sensory exam, and proximal medial left hallux nail brittle cracking detaching from nail plate  Assessment:   1. Onycholysis      Plan:  Patient was evaluated and treated and all questions answered.  Discussed multiple etiologies of nail dystrophy and onycholysis including infection and inflammatory disorder such as psoriasis.  Recommended culture of the nail plate, this was taken today.  I will let her know what the results of the show if there is fungal infection we will plan to treat accordingly.  If not would trial topical high potency steroid.  Long-term could consider removal of nail and biopsy.  Return if symptoms worsen or fail to improve.

## 2022-12-10 DIAGNOSIS — M25511 Pain in right shoulder: Secondary | ICD-10-CM | POA: Diagnosis not present

## 2022-12-10 DIAGNOSIS — M7501 Adhesive capsulitis of right shoulder: Secondary | ICD-10-CM | POA: Diagnosis not present

## 2022-12-13 ENCOUNTER — Other Ambulatory Visit: Payer: Self-pay | Admitting: Acute Care

## 2022-12-13 DIAGNOSIS — Z122 Encounter for screening for malignant neoplasm of respiratory organs: Secondary | ICD-10-CM

## 2022-12-13 DIAGNOSIS — Z87891 Personal history of nicotine dependence: Secondary | ICD-10-CM

## 2022-12-16 DIAGNOSIS — M25511 Pain in right shoulder: Secondary | ICD-10-CM | POA: Diagnosis not present

## 2022-12-16 DIAGNOSIS — M7501 Adhesive capsulitis of right shoulder: Secondary | ICD-10-CM | POA: Diagnosis not present

## 2022-12-17 DIAGNOSIS — M810 Age-related osteoporosis without current pathological fracture: Secondary | ICD-10-CM | POA: Diagnosis not present

## 2022-12-23 ENCOUNTER — Encounter: Payer: Self-pay | Admitting: Podiatry

## 2022-12-24 DIAGNOSIS — M7501 Adhesive capsulitis of right shoulder: Secondary | ICD-10-CM | POA: Diagnosis not present

## 2022-12-24 DIAGNOSIS — M25511 Pain in right shoulder: Secondary | ICD-10-CM | POA: Diagnosis not present

## 2022-12-25 MED ORDER — CLOBETASOL PROPIONATE 0.05 % EX OINT: 1.0000 | TOPICAL_OINTMENT | Freq: Two times a day (BID) | CUTANEOUS | 0 refills | Status: AC

## 2022-12-29 ENCOUNTER — Other Ambulatory Visit: Payer: Self-pay | Admitting: Family Medicine

## 2022-12-29 DIAGNOSIS — K219 Gastro-esophageal reflux disease without esophagitis: Secondary | ICD-10-CM

## 2022-12-29 DIAGNOSIS — J4489 Other specified chronic obstructive pulmonary disease: Secondary | ICD-10-CM

## 2022-12-29 DIAGNOSIS — J432 Centrilobular emphysema: Secondary | ICD-10-CM

## 2022-12-29 DIAGNOSIS — F325 Major depressive disorder, single episode, in full remission: Secondary | ICD-10-CM

## 2022-12-29 DIAGNOSIS — G43009 Migraine without aura, not intractable, without status migrainosus: Secondary | ICD-10-CM

## 2022-12-30 DIAGNOSIS — M7501 Adhesive capsulitis of right shoulder: Secondary | ICD-10-CM | POA: Diagnosis not present

## 2022-12-30 DIAGNOSIS — M25511 Pain in right shoulder: Secondary | ICD-10-CM | POA: Diagnosis not present

## 2023-01-01 ENCOUNTER — Ambulatory Visit
Admission: RE | Admit: 2023-01-01 | Discharge: 2023-01-01 | Disposition: A | Discharge status: Home / Self Care | Payer: Medicare HMO | Source: Ambulatory Visit | Attending: Acute Care | Admitting: Acute Care

## 2023-01-01 DIAGNOSIS — Z122 Encounter for screening for malignant neoplasm of respiratory organs: Secondary | ICD-10-CM | POA: Insufficient documentation

## 2023-01-01 DIAGNOSIS — Z87891 Personal history of nicotine dependence: Secondary | ICD-10-CM | POA: Insufficient documentation

## 2023-01-06 DIAGNOSIS — M25511 Pain in right shoulder: Secondary | ICD-10-CM | POA: Diagnosis not present

## 2023-01-06 DIAGNOSIS — M7501 Adhesive capsulitis of right shoulder: Secondary | ICD-10-CM | POA: Diagnosis not present

## 2023-01-09 ENCOUNTER — Encounter: Payer: Self-pay | Admitting: Family Medicine

## 2023-01-09 ENCOUNTER — Other Ambulatory Visit: Payer: Self-pay | Admitting: Acute Care

## 2023-01-09 DIAGNOSIS — I2510 Atherosclerotic heart disease of native coronary artery without angina pectoris: Secondary | ICD-10-CM | POA: Insufficient documentation

## 2023-01-09 DIAGNOSIS — Z87891 Personal history of nicotine dependence: Secondary | ICD-10-CM

## 2023-01-09 DIAGNOSIS — Z122 Encounter for screening for malignant neoplasm of respiratory organs: Secondary | ICD-10-CM

## 2023-01-13 DIAGNOSIS — M25511 Pain in right shoulder: Secondary | ICD-10-CM | POA: Diagnosis not present

## 2023-01-13 DIAGNOSIS — M7501 Adhesive capsulitis of right shoulder: Secondary | ICD-10-CM | POA: Diagnosis not present

## 2023-01-20 DIAGNOSIS — M25511 Pain in right shoulder: Secondary | ICD-10-CM | POA: Diagnosis not present

## 2023-01-20 DIAGNOSIS — M7501 Adhesive capsulitis of right shoulder: Secondary | ICD-10-CM | POA: Diagnosis not present

## 2023-02-01 ENCOUNTER — Other Ambulatory Visit: Payer: Self-pay | Admitting: Family Medicine

## 2023-02-01 DIAGNOSIS — J4489 Other specified chronic obstructive pulmonary disease: Secondary | ICD-10-CM

## 2023-02-01 DIAGNOSIS — J432 Centrilobular emphysema: Secondary | ICD-10-CM

## 2023-02-01 DIAGNOSIS — I700 Atherosclerosis of aorta: Secondary | ICD-10-CM

## 2023-02-01 DIAGNOSIS — E785 Hyperlipidemia, unspecified: Secondary | ICD-10-CM

## 2023-02-01 DIAGNOSIS — F339 Major depressive disorder, recurrent, unspecified: Secondary | ICD-10-CM

## 2023-02-01 DIAGNOSIS — G43009 Migraine without aura, not intractable, without status migrainosus: Secondary | ICD-10-CM

## 2023-02-01 DIAGNOSIS — F325 Major depressive disorder, single episode, in full remission: Secondary | ICD-10-CM

## 2023-02-01 DIAGNOSIS — F5105 Insomnia due to other mental disorder: Secondary | ICD-10-CM

## 2023-02-02 ENCOUNTER — Other Ambulatory Visit: Payer: Self-pay

## 2023-02-02 ENCOUNTER — Other Ambulatory Visit: Payer: Self-pay | Admitting: Family Medicine

## 2023-02-02 DIAGNOSIS — F325 Major depressive disorder, single episode, in full remission: Secondary | ICD-10-CM

## 2023-02-02 DIAGNOSIS — G43009 Migraine without aura, not intractable, without status migrainosus: Secondary | ICD-10-CM

## 2023-02-02 DIAGNOSIS — F5105 Insomnia due to other mental disorder: Secondary | ICD-10-CM

## 2023-02-02 DIAGNOSIS — E038 Other specified hypothyroidism: Secondary | ICD-10-CM

## 2023-02-02 DIAGNOSIS — J432 Centrilobular emphysema: Secondary | ICD-10-CM

## 2023-02-02 DIAGNOSIS — B009 Herpesviral infection, unspecified: Secondary | ICD-10-CM

## 2023-02-02 MED ORDER — SUMATRIPTAN SUCCINATE 50 MG PO TABS
ORAL_TABLET | ORAL | 0 refills | Status: DC
Start: 2023-02-02 — End: 2023-02-03

## 2023-02-02 MED ORDER — TRAZODONE HCL 100 MG PO TABS: ORAL_TABLET | ORAL | 0 refills | Status: DC

## 2023-02-02 MED ORDER — ALBUTEROL SULFATE HFA 108 (90 BASE) MCG/ACT IN AERS
INHALATION_SPRAY | RESPIRATORY_TRACT | 0 refills | Status: DC
Start: 2023-02-02 — End: 2023-02-27

## 2023-02-02 MED ORDER — BUPROPION HCL ER (XL) 150 MG PO TB24
ORAL_TABLET | ORAL | 0 refills | Status: DC
Start: 2023-02-02 — End: 2023-03-16

## 2023-02-02 MED ORDER — DULOXETINE HCL 60 MG PO CPEP: 60.0000 mg | ORAL_CAPSULE | ORAL | 0 refills | Status: DC

## 2023-02-02 MED ORDER — VALACYCLOVIR HCL 500 MG PO TABS: 500.0000 mg | ORAL_TABLET | Freq: Every day | ORAL | 0 refills | Status: DC

## 2023-02-04 DIAGNOSIS — M25511 Pain in right shoulder: Secondary | ICD-10-CM | POA: Diagnosis not present

## 2023-02-04 DIAGNOSIS — M7501 Adhesive capsulitis of right shoulder: Secondary | ICD-10-CM | POA: Diagnosis not present

## 2023-02-09 ENCOUNTER — Ambulatory Visit: Payer: Self-pay | Admitting: *Deleted

## 2023-02-09 NOTE — Telephone Encounter (Signed)
  Chief Complaint: Gas Symptoms: Passing foul smelling gas, unable to control it.   Causing her embarrassment.  Frequency: Daily Pertinent Negatives: Patient denies having diarrhea, vomiting or abd pain.    Just passing a lot of gas.   Having more frequent solid BMs daily. Disposition: [] ED /[] Urgent Care (no appt availability in office) / [] Appointment(In office/virtual)/ []  [facility] [place] [facility]/ [] Home Care/ [] Refused Recommended Disposition /[]  Mobile Bus/ [x]  Follow-up with PCP Additional Notes: Pt did not want to make an appt.    "I would like some suggestions from Dr. Carlynn Purl what I can take or do for this gas".  I've been taking peppermint I got from [place] but it's not helping.   Tried Gas X Extra Strength without any relief.  Message sent to Dr. Carlynn Purl

## 2023-02-09 NOTE — Telephone Encounter (Signed)
Message from Sara Andrews sent at 02/09/2023  8:28 AM EDT  Summary: advice for gas   Pt has ongoing gas, bloating and diarrhea for several days.  Pt has been trying some sort of peppermint that is supposed to help, but it has not helped.  Would like to know if you have any suggestions on what she can take to help.          Call History  Contact Date/Time Type Contact Phone/Fax User  02/09/2023 08:07 AM EDT Phone (Incoming) Zeynep, Fantroy (Self) [phone] Judie Petit) Crist Infante   Answer Assessment - Initial Assessment Questions 1. DIARRHEA SEVERITY: "How bad is the diarrhea?" "How many more stools have you had in the past 24 hours than normal?"    - NO DIARRHEA (SCALE 0)   - MILD (SCALE 1-3): Few loose or mushy BMs; increase of 1-3 stools over normal daily number of stools; mild increase in ostomy output.   -  MODERATE (SCALE 4-7): Increase of 4-6 stools daily over normal; moderate increase in ostomy output.   -  SEVERE (SCALE 8-10; OR "WORST POSSIBLE"): Increase of 7 or more stools daily over normal; moderate increase in ostomy output; incontinence.     I'm having frequent stools since starting this peppermint I got on [employer].    It's supposed to help with gas.    I was taking Gas X and it's not helping. 2. ONSET: "When did the diarrhea begin?"      It started 2 months ago.   The gas smells very bad and I can't help it and it comes out.   It's embarrassing.      3. BM CONSISTENCY: "How loose or watery is the diarrhea?"      It's not diarrhea just so much.    It's solid. 4. VOMITING: "Are you also vomiting?" If Yes, ask: "How many times in the past 24 hours?"      No vomiting or nausea. 5. ABDOMEN PAIN: "Are you having any abdomen pain?" If Yes, ask: "What does it feel like?" (e.g., crampy, dull, intermittent, constant)      No abd pain 6. ABDOMEN PAIN SEVERITY: If present, ask: "How bad is the pain?"  (e.g., Scale 1-10; mild, moderate, or severe)   - MILD (1-3): doesn't interfere with  normal activities, abdomen soft and not tender to touch    - MODERATE (4-7): interferes with normal activities or awakens from sleep, abdomen tender to touch    - SEVERE (8-10): excruciating pain, doubled over, unable to do any normal activities       None 7. ORAL INTAKE: If vomiting, "Have you been able to drink liquids?" "How much liquids have you had in the past 24 hours?"     Eating very little.   8. HYDRATION: "Any signs of dehydration?" (e.g., dry mouth [not just dry lips], too weak to stand, dizziness, new weight loss) "When did you last urinate?"     Not asked 9. EXPOSURE: "Have you traveled to a foreign country recently?" "Have you been exposed to anyone with diarrhea?" "Could you have eaten any food that was spoiled?"     Not asked 10. ANTIBIOTIC USE: "Are you taking antibiotics now or have you taken antibiotics in the past 2 months?"       No 11. OTHER SYMPTOMS: "Do you have any other symptoms?" (e.g., fever, blood in stool)       No 12. PREGNANCY: "Is there any chance you are pregnant?" "When was  your last menstrual period?"       N/A due to age  Protocols used: [facility]

## 2023-02-11 DIAGNOSIS — M5030 Other cervical disc degeneration, unspecified cervical region: Secondary | ICD-10-CM | POA: Diagnosis not present

## 2023-02-11 DIAGNOSIS — M4802 Spinal stenosis, cervical region: Secondary | ICD-10-CM | POA: Diagnosis not present

## 2023-02-11 DIAGNOSIS — M19011 Primary osteoarthritis, right shoulder: Secondary | ICD-10-CM | POA: Diagnosis not present

## 2023-02-11 DIAGNOSIS — I6523 Occlusion and stenosis of bilateral carotid arteries: Secondary | ICD-10-CM | POA: Diagnosis not present

## 2023-02-11 DIAGNOSIS — M47812 Spondylosis without myelopathy or radiculopathy, cervical region: Secondary | ICD-10-CM | POA: Diagnosis not present

## 2023-02-11 DIAGNOSIS — M50221 Other cervical disc displacement at C4-C5 level: Secondary | ICD-10-CM | POA: Diagnosis not present

## 2023-02-11 DIAGNOSIS — M5412 Radiculopathy, cervical region: Secondary | ICD-10-CM | POA: Diagnosis not present

## 2023-02-11 DIAGNOSIS — M50321 Other cervical disc degeneration at C4-C5 level: Secondary | ICD-10-CM | POA: Diagnosis not present

## 2023-02-11 DIAGNOSIS — M85811 Other specified disorders of bone density and structure, right shoulder: Secondary | ICD-10-CM | POA: Diagnosis not present

## 2023-02-12 DIAGNOSIS — M7501 Adhesive capsulitis of right shoulder: Secondary | ICD-10-CM | POA: Diagnosis not present

## 2023-02-12 DIAGNOSIS — M25511 Pain in right shoulder: Secondary | ICD-10-CM | POA: Diagnosis not present

## 2023-02-18 DIAGNOSIS — E538 Deficiency of other specified B group vitamins: Secondary | ICD-10-CM | POA: Diagnosis not present

## 2023-02-18 DIAGNOSIS — M7581 Other shoulder lesions, right shoulder: Secondary | ICD-10-CM | POA: Diagnosis not present

## 2023-02-18 DIAGNOSIS — M47812 Spondylosis without myelopathy or radiculopathy, cervical region: Secondary | ICD-10-CM | POA: Diagnosis not present

## 2023-02-18 DIAGNOSIS — R251 Tremor, unspecified: Secondary | ICD-10-CM | POA: Diagnosis not present

## 2023-02-18 DIAGNOSIS — M19011 Primary osteoarthritis, right shoulder: Secondary | ICD-10-CM | POA: Diagnosis not present

## 2023-02-25 ENCOUNTER — Other Ambulatory Visit: Payer: Self-pay | Admitting: Family Medicine

## 2023-02-25 DIAGNOSIS — J432 Centrilobular emphysema: Secondary | ICD-10-CM

## 2023-03-03 ENCOUNTER — Other Ambulatory Visit: Payer: Self-pay | Admitting: Family Medicine

## 2023-03-03 DIAGNOSIS — G43009 Migraine without aura, not intractable, without status migrainosus: Secondary | ICD-10-CM

## 2023-03-12 ENCOUNTER — Other Ambulatory Visit: Payer: Self-pay | Admitting: Family Medicine

## 2023-03-12 DIAGNOSIS — E538 Deficiency of other specified B group vitamins: Secondary | ICD-10-CM | POA: Diagnosis not present

## 2023-03-12 DIAGNOSIS — R251 Tremor, unspecified: Secondary | ICD-10-CM | POA: Diagnosis not present

## 2023-03-12 DIAGNOSIS — R4189 Other symptoms and signs involving cognitive functions and awareness: Secondary | ICD-10-CM | POA: Diagnosis not present

## 2023-03-12 DIAGNOSIS — Z1231 Encounter for screening mammogram for malignant neoplasm of breast: Secondary | ICD-10-CM

## 2023-03-13 ENCOUNTER — Other Ambulatory Visit: Payer: Self-pay | Admitting: Neurology

## 2023-03-13 DIAGNOSIS — R251 Tremor, unspecified: Secondary | ICD-10-CM

## 2023-03-13 NOTE — Progress Notes (Unsigned)
Name: Sara Andrews   MRN: [id number]    DOB: [birth year]   Date:03/16/2023       Progress Note  Subjective  Chief Complaint  Follow Up  HPI  Major Depression:  no longer seeing Dr. Elna Breslow but taking medication as prescribed - Duloxetine and Wellbutrin during the day and Trazodone at night, she was feeling anxious and picking on her scalp, we added Abilify and she is doing better now. She is still depressed and wants to see a psychiatrist . New stress is that her sister was recently diagnosed with lung cancer .   Recurrent headaches: doing well on prn Maxalt , she states not as frequent, she described pain as throbbing, photophobia, eye pain during episodes. Episodes are seldom and responds well to medication   DDD and spondylolisthesis/Neck pain : doing well since surgery done 02/2018. She is off pain  medication, taking prn tylenol only and since pain is under control, she used to see Dr. Noel Gerold, now seeing Dr. Council Mechanic, she had some steroid injections, hand tremors improved with B12 supplementation, but went to see Dr. Sherryll Burger and is having more labs and evaluation doen .   RLS: only at night, she has to stand up and walk around and it improves, she is taking magnesium. Requip made symptoms worse.  Unchanged   Emphysema : she takes Trelegy daily, she has a daily cough, stableSOB, or wheezing. No fever or chills.   Recurrent scalp pruritis: resolves since started taking Valtrex every night, she wants to continue taking it. Unchanged    GERD/hiatal hernia: she is no longer taking Ozempic, able to tolerate lower dose PPI, Dr. Allegra Lai advised to stay on it for the rest of her life. Indigestion is better however noticing that she has been passing gas more often and keeps her from going out of the house. Discussed food that can cause increase in flatus formation. Try simethicone and may need to discuss it with Dr. Allegra Lai   Hypothyroidism: She is currently taking 75 mcg daily and half on Mondays,  last TSH was at goal. Denies change in bowel movements , her hair is thinning out.  Continue current regiment    DM II : diagnosed at work back in 2012 she used to take medications  She denies polyphagia, polyuria , she has polydipsia.  Started on Trulicity June 2018, but she was having worsening of constipation and indigestion, so we switched to Ozempic 04/2017 weight was 198 lbs and went  down to 132.4  lbs. She was off Ozempic since January 21 and weight went up to 160 lbs, we stopped medication due to bloating and nausea. A1C has been controlled - today is 5.7 %  but weight is going up . She is on Metformin - she will try holding medication for one week to see if she has less flatus formation     Osteoporosis: she had first Reclast injection January 2023 , she is up to date with visits with Dr. Gershon Crane   Chest pain: intermittently and not as frequent, at most once a month,   she is under the care of  cardiologist. Dr. Azucena Cecil and had EKG and echo that showed normal EF . CT lung had already shown some coronary calcification. She is compliant with statin therapy    Senile purpura: reassurance given. Both arms and legs  Stable    Atherosclerosis Aorta: found on CT done 03/20/2020, on statin therapy , no side effects of medication , LDL is at goal  B12 deficiency: she is now taking SL B12 and level improved, she states tremors have improved .  Patient Active Problem List   Diagnosis Date Noted   CAD (coronary artery disease), native coronary artery 01/09/2023   Hiatal hernia 10/24/2022   Chronic GERD 10/24/2022   Spasms of the hands or feet 01/10/2022   Insomnia due to mental disorder 01/10/2022   Tendinitis of upper biceps tendon of left shoulder 05/22/2021   Rotator cuff tendinitis, left 05/22/2021   Nontraumatic incomplete tear of left rotator cuff 05/22/2021   History of shingles 03/23/2020   Burning sensation of lower extremity 03/22/2020   Centrilobular emphysema (HCC)  01/12/2020   Senile purpura (HCC) 01/12/2020   Major depression in remission (HCC) 01/12/2020   Dyslipidemia associated with type 2 diabetes mellitus (HCC) 01/12/2020   Bursitis of hip 08/23/2019   Carpal tunnel syndrome 08/23/2019   Thoracic aorta atherosclerosis (HCC) 08/03/2019   Spondylosis of lumbar spine 11/06/2016   Spinal stenosis of lumbar region with neurogenic claudication 10/28/2016   Chronic bilateral low back pain with bilateral sciatica 09/16/2016   Moderate episode of recurrent major depressive disorder (HCC) 08/07/2016   Pain of left heel 06/02/2016   Degenerative cervical disc 03/04/2016   Gastroesophageal reflux disease without esophagitis 02/01/2016   Primary osteoarthritis of right hand 02/01/2016   Paresthesias in left hand 02/01/2016   History of smoking 10/05/2013   Hyperlipidemia 10/05/2013   Adult onset hypothyroidism 10/05/2013    Past Surgical History:  Procedure Laterality Date   ABDOMINAL HYSTERECTOMY     CARPAL TUNNEL RELEASE Right    COLON SURGERY     ESOPHAGOGASTRODUODENOSCOPY (EGD) WITH PROPOFOL N/A 10/24/2022   Procedure: ESOPHAGOGASTRODUODENOSCOPY (EGD) WITH PROPOFOL;  Surgeon: Toney Reil, MD;  Location: [facility];  Service: Gastroenterology;  Laterality: N/A;   FRACTURE SURGERY     LUMBAR LAMINECTOMY  03/16/2018   L4-5 Laminectomy & Fusion w/ Pedicle Screws, TLIF & Allograft; Surgeon: Virl Diamond, MD; Location: [facility] MAIN OR; Service: Orthopedics; Laterality: N/A; Prone, ProAxis, Stim Neuromonitoring, O-Arm, Cell Saver, [employer], [employer], Justin, PACS on [facility]     OOPHORECTOMY Bilateral    ORIF TIBIA PLATEAU Left 03/10/2016   Procedure: OPEN REDUCTION INTERNAL FIXATION (ORIF) BICONDYLAR TIBIAL PLATEAU FRACTURE;  Surgeon: Eldred Manges, MD;  Location: [facility] OR;  Service: Orthopedics;  Laterality: Left;   SHOULDER ARTHROSCOPY W/ ROTATOR CUFF REPAIR Right    SHOULDER ARTHROSCOPY WITH SUBACROMIAL DECOMPRESSION, ROTATOR CUFF REPAIR  AND BICEP TENDON REPAIR Left 05/21/2021   Procedure: SHOULDER ARTHROSCOPY WITH DEBRIDEMENT, DECOMPRESSION, REPAIR OF PARTIAL THICKNESS ROTATOR CUFF REPAIR, BICEPS TENODESIS;  Surgeon: Christena Flake, MD;  Location: [facility] ORS;  Service: Orthopedics;  Laterality: Left;   TONSILLECTOMY AND ADENOIDECTOMY      Family History  Problem Relation Age of Onset   Diabetes Father    Heart disease Father    Depression Sister    Alcohol abuse Sister    Anxiety disorder Sister    Bipolar disorder Sister    Pneumonia Sister    Breast cancer Maternal Aunt 82   Breast cancer Paternal Aunt 59   Hypertension Daughter    Breast cancer Daughter    Skin cancer Daughter    Heart attack Daughter    Anxiety disorder Daughter     Social History   Tobacco Use   Smoking status: Former    Current packs/day: 0.00    Average packs/day: 2.0 packs/day for 30.0 years (60.0 ttl pk-yrs)    Types: Cigarettes  Start date: 09/03/1978    Quit date: 09/03/2008    Years since quitting: 14.5   Smokeless tobacco: Never  Substance Use Topics   Alcohol use: No     Current Outpatient Medications:    albuterol (VENTOLIN HFA) 108 (90 Base) MCG/ACT inhaler, INHALE 2 PUFFS INTO LUNGS EVERY 6 HOURS AS NEEDED FOR WHEEZING OR SHORTNESS OF BREATH, Disp: 6.7 each, Rfl: 0   calcium carbonate (OSCAL) 1500 (600 Ca) MG TABS tablet, Take 1,500 mg by mouth 2 (two) times daily with a meal., Disp: , Rfl:    Cholecalciferol (VITAMIN D PO), Take 1,000 Units by mouth daily. 1000 IU, Disp: , Rfl:    cyanocobalamin (VITAMIN B12) 1000 MCG tablet, Take 1,000 mcg by mouth daily., Disp: , Rfl:    etodolac (LODINE) 500 MG tablet, Take 500 mg by mouth 2 (two) times daily., Disp: , Rfl:    fluticasone (FLONASE) 50 MCG/ACT nasal spray, SHAKE LIQUID AND USE 2 SPRAYS IN EACH NOSTRIL DAILY, Disp: 48 g, Rfl: 0   gabapentin (NEURONTIN) 300 MG capsule, Take 300 mg by mouth at bedtime., Disp: , Rfl:    Melatonin 10 MG TABS, Take 10 mg by mouth at bedtime.,  Disp: , Rfl:    omeprazole (PRILOSEC) 20 MG capsule, Take 20 mg by mouth daily., Disp: , Rfl:    rizatriptan (MAXALT) 10 MG tablet, TAKE 1 TABLET (10 MG TOTAL) BY MOUTH DAILY AS NEEDED FOR MIGRAINE. NO MORE THAN 2 IN 48 HOURS, Disp: 9 tablet, Rfl: 0   ARIPiprazole (ABILIFY) 2 MG tablet, Take 1 tablet (2 mg total) by mouth daily., Disp: 90 tablet, Rfl: 0   buPROPion (WELLBUTRIN XL) 150 MG 24 hr tablet, TAKE 1 TABLET(150 MG) BY MOUTH DAILY, Disp: 90 tablet, Rfl: 0   clobetasol ointment (TEMOVATE) 0.05 %, Apply 1 Application topically 2 (two) times daily. (Patient not taking: Reported on 03/16/2023), Disp: 60 g, Rfl: 0   DULoxetine (CYMBALTA) 60 MG capsule, Take 1 capsule (60 mg total) by mouth every morning., Disp: 90 capsule, Rfl: 0   Fluticasone-Umeclidin-Vilant (TRELEGY ELLIPTA) 100-62.5-25 MCG/ACT AEPB, Inhale 1 puff into the lungs daily., Disp: 180 each, Rfl: 1   levothyroxine (SYNTHROID) 75 MCG tablet, Take 1 tablet (75 mcg total) by mouth daily before breakfast., Disp: 90 tablet, Rfl: 1   loratadine (CLARITIN) 10 MG tablet, TAKE 1 TABLET (10 MG) BY MOUTH EVERY DAY AT BEDTIME, OTC NOT COVERED, Disp: 90 tablet, Rfl: 1   metFORMIN (GLUCOPHAGE-XR) 500 MG 24 hr tablet, Take 1 tablet (500 mg total) by mouth daily with breakfast., Disp: 90 tablet, Rfl: 1   montelukast (SINGULAIR) 10 MG tablet, Take 1 tablet (10 mg total) by mouth at bedtime., Disp: 90 tablet, Rfl: 1   rosuvastatin (CRESTOR) 20 MG tablet, TAKE 1 TABLET(20 MG) BY MOUTH DAILY, Disp: 90 tablet, Rfl: 1   traZODone (DESYREL) 100 MG tablet, TAKE 1 TABLET(100 MG) BY MOUTH AT BEDTIME, Disp: 90 tablet, Rfl: 0   valACYclovir (VALTREX) 500 MG tablet, Take 1 tablet (500 mg total) by mouth daily., Disp: 90 tablet, Rfl: 3  No Known Allergies  I personally reviewed active problem list, medication list, allergies, family history, social history, health maintenance with the patient/caregiver today.   ROS  Constitutional: Negative for fever or  weight change.  Respiratory: positive  for cough and shortness of breath.   Cardiovascular: Negative for chest pain or palpitations.  Gastrointestinal: Negative for abdominal pain, no bowel changes.  Musculoskeletal: Negative for gait problem or joint swelling.  Skin: wound on left lower leg Neurological: Negative for dizziness , positive intermittent  headache.  No other specific complaints in a complete review of systems (except as listed in HPI above).   Objective  Vitals:   03/16/23 1008  BP: 118/62  Pulse: 87  Temp: 97.6 F (36.4 C)  SpO2: 96%  Weight: 157 lb 1.6 oz (71.3 kg)  Height: 4\' 11"  (1.499 m)    Body mass index is 31.73 kg/m.  Physical Exam  Constitutional: Patient appears well-developed and well-nourished. Obese  No distress.  HEENT: head atraumatic, normocephalic, pupils equal and reactive to light, neck supple Cardiovascular: Normal rate, regular rhythm and normal heart sounds.  No murmur heard. No BLE edema. Skin: see attached photo Pulmonary/Chest: Effort normal and breath sounds normal. No respiratory distress. Abdominal: Soft.  There is no tenderness. Psychiatric: Patient has a normal mood and affect. behavior is normal. Judgment and thought content normal.   PHQ2/9:    03/16/2023   10:12 AM 11/13/2022    8:25 AM 09/17/2022    9:50 AM 09/08/2022    8:59 AM 08/13/2022   11:21 AM  Depression screen PHQ 2/9  Decreased Interest 2 2 0 0 0  Down, Depressed, Hopeless 0 1 0 0 0  PHQ - 2 Score 2 3 0 0 0  Altered sleeping 3 0 1 3 1   Tired, decreased energy 2 0 0 0 1  Change in appetite 3 0 0 0 0  Feeling bad or failure about yourself  2 0 0 0 0  Trouble concentrating 0 0 0 0 0  Moving slowly or fidgety/restless 0 0 0 0 0  Suicidal thoughts 0 0 0 0 0  PHQ-9 Score 12 3 1 3 2   Difficult doing work/chores Somewhat difficult  Not difficult at all  Not difficult at all    phq 9 is positive   Fall Risk:    03/16/2023   10:12 AM 11/13/2022    8:24 AM  09/17/2022    9:49 AM 09/08/2022    8:51 AM 08/13/2022   10:29 AM  Fall Risk   Falls in the past year? 1 0 0 0 0  Number falls in past yr: 1 0 0 0 0  Injury with Fall? 0 0 0 0 0  Risk for fall due to :  No Fall Risks No Fall Risks No Fall Risks No Fall Risks  Follow up  Falls prevention discussed Falls prevention discussed;Education provided;Falls evaluation completed Falls prevention discussed Falls prevention discussed;Education provided;Falls evaluation completed     Assessment & Plan  1. Dyslipidemia associated with type 2 diabetes mellitus (HCC)  - POCT HgB A1C - metFORMIN (GLUCOPHAGE-XR) 500 MG 24 hr tablet; Take 1 tablet (500 mg total) by mouth daily with breakfast.  Dispense: 90 tablet; Refill: 1  2. Centrilobular emphysema (HCC)  - Fluticasone-Umeclidin-Vilant (TRELEGY ELLIPTA) 100-62.5-25 MCG/ACT AEPB; Inhale 1 puff into the lungs daily.  Dispense: 180 each; Refill: 1  3. MDD (major depressive disorder), recurrent episode, moderate (HCC)  - Ambulatory referral to Psychiatry - DULoxetine (CYMBALTA) 60 MG capsule; Take 1 capsule (60 mg total) by mouth every morning.  Dispense: 90 capsule; Refill: 0 - ARIPiprazole (ABILIFY) 2 MG tablet; Take 1 tablet (2 mg total) by mouth daily.  Dispense: 90 tablet; Refill: 0  4. Thoracic aorta atherosclerosis (HCC)  - rosuvastatin (CRESTOR) 20 MG tablet; TAKE 1 TABLET(20 MG) BY MOUTH DAILY  Dispense: 90 tablet; Refill: 1  5. Senile purpura (HCC)  stable  6. Asthma-COPD overlap syndrome  - loratadine (CLARITIN) 10 MG tablet; TAKE 1 TABLET (10 MG) BY MOUTH EVERY DAY AT BEDTIME, OTC NOT COVERED  Dispense: 90 tablet; Refill: 1 - montelukast (SINGULAIR) 10 MG tablet; Take 1 tablet (10 mg total) by mouth at bedtime.  Dispense: 90 tablet; Refill: 1  7. Dyslipidemia  - rosuvastatin (CRESTOR) 20 MG tablet; TAKE 1 TABLET(20 MG) BY MOUTH DAILY  Dispense: 90 tablet; Refill: 1  8. Insomnia due to mental disorder  - traZODone (DESYREL) 100 MG  tablet; TAKE 1 TABLET(100 MG) BY MOUTH AT BEDTIME  Dispense: 90 tablet; Refill: 0  9. Adult onset hypothyroidism  - buPROPion (WELLBUTRIN XL) 150 MG 24 hr tablet; TAKE 1 TABLET(150 MG) BY MOUTH DAILY  Dispense: 90 tablet; Refill: 0 - levothyroxine (SYNTHROID) 75 MCG tablet; Take 1 tablet (75 mcg total) by mouth daily before breakfast.  Dispense: 90 tablet; Refill: 1  10. Recurrent type 1 herpes simplex of other site  - valACYclovir (VALTREX) 500 MG tablet; Take 1 tablet (500 mg total) by mouth daily.  Dispense: 90 tablet; Refill: 3  11. Migraine without aura and without status migrainosus, not intractable

## 2023-03-16 ENCOUNTER — Ambulatory Visit (INDEPENDENT_AMBULATORY_CARE_PROVIDER_SITE_OTHER): Payer: Medicare HMO | Admitting: Family Medicine

## 2023-03-16 ENCOUNTER — Encounter: Payer: Self-pay | Admitting: Family Medicine

## 2023-03-16 VITALS — BP 118/62 | HR 87 | Temp 97.6°F | Ht 59.0 in | Wt 157.1 lb

## 2023-03-16 DIAGNOSIS — G43009 Migraine without aura, not intractable, without status migrainosus: Secondary | ICD-10-CM

## 2023-03-16 DIAGNOSIS — F5105 Insomnia due to other mental disorder: Secondary | ICD-10-CM | POA: Diagnosis not present

## 2023-03-16 DIAGNOSIS — Z7984 Long term (current) use of oral hypoglycemic drugs: Secondary | ICD-10-CM

## 2023-03-16 DIAGNOSIS — E1169 Type 2 diabetes mellitus with other specified complication: Secondary | ICD-10-CM

## 2023-03-16 DIAGNOSIS — R251 Tremor, unspecified: Secondary | ICD-10-CM | POA: Diagnosis not present

## 2023-03-16 DIAGNOSIS — I700 Atherosclerosis of aorta: Secondary | ICD-10-CM | POA: Diagnosis not present

## 2023-03-16 DIAGNOSIS — J432 Centrilobular emphysema: Secondary | ICD-10-CM

## 2023-03-16 DIAGNOSIS — E785 Hyperlipidemia, unspecified: Secondary | ICD-10-CM

## 2023-03-16 DIAGNOSIS — J4489 Other specified chronic obstructive pulmonary disease: Secondary | ICD-10-CM | POA: Diagnosis not present

## 2023-03-16 DIAGNOSIS — R4189 Other symptoms and signs involving cognitive functions and awareness: Secondary | ICD-10-CM | POA: Diagnosis not present

## 2023-03-16 DIAGNOSIS — F331 Major depressive disorder, recurrent, moderate: Secondary | ICD-10-CM

## 2023-03-16 DIAGNOSIS — B009 Herpesviral infection, unspecified: Secondary | ICD-10-CM | POA: Diagnosis not present

## 2023-03-16 DIAGNOSIS — S81802A Unspecified open wound, left lower leg, initial encounter: Secondary | ICD-10-CM

## 2023-03-16 DIAGNOSIS — E038 Other specified hypothyroidism: Secondary | ICD-10-CM | POA: Diagnosis not present

## 2023-03-16 DIAGNOSIS — E538 Deficiency of other specified B group vitamins: Secondary | ICD-10-CM | POA: Diagnosis not present

## 2023-03-16 DIAGNOSIS — D692 Other nonthrombocytopenic purpura: Secondary | ICD-10-CM

## 2023-03-16 LAB — POCT GLYCOSYLATED HEMOGLOBIN (HGB A1C): Hemoglobin A1C: 5.7 % — AB (ref 4.0–5.6)

## 2023-03-16 MED ORDER — METFORMIN HCL ER 500 MG PO TB24
500.0000 mg | ORAL_TABLET | Freq: Every day | ORAL | 1 refills | Status: DC
Start: 2023-03-16 — End: 2023-03-25

## 2023-03-16 MED ORDER — MONTELUKAST SODIUM 10 MG PO TABS
10.0000 mg | ORAL_TABLET | Freq: Every day | ORAL | 1 refills | Status: DC
Start: 2023-03-16 — End: 2023-07-16

## 2023-03-16 MED ORDER — TRELEGY ELLIPTA 100-62.5-25 MCG/ACT IN AEPB
1.0000 | INHALATION_SPRAY | Freq: Every day | RESPIRATORY_TRACT | 1 refills | Status: DC
Start: 2023-03-16 — End: 2023-07-16

## 2023-03-16 MED ORDER — LORATADINE 10 MG PO TABS
ORAL_TABLET | ORAL | 1 refills | Status: DC
Start: 2023-03-16 — End: 2024-03-22

## 2023-03-16 MED ORDER — LEVOTHYROXINE SODIUM 75 MCG PO TABS
75.0000 ug | ORAL_TABLET | Freq: Every day | ORAL | 1 refills | Status: DC
Start: 2023-03-16 — End: 2023-11-17

## 2023-03-16 MED ORDER — TRAZODONE HCL 100 MG PO TABS
ORAL_TABLET | ORAL | 0 refills | Status: DC
Start: 2023-03-16 — End: 2023-07-16

## 2023-03-16 MED ORDER — VALACYCLOVIR HCL 500 MG PO TABS
500.0000 mg | ORAL_TABLET | Freq: Every day | ORAL | 3 refills | Status: DC
Start: 2023-03-16 — End: 2024-03-31

## 2023-03-16 MED ORDER — DULOXETINE HCL 60 MG PO CPEP
60.0000 mg | ORAL_CAPSULE | ORAL | 0 refills | Status: DC
Start: 2023-03-16 — End: 2023-05-11

## 2023-03-16 MED ORDER — BUPROPION HCL ER (XL) 150 MG PO TB24
ORAL_TABLET | ORAL | 0 refills | Status: DC
Start: 2023-03-16 — End: 2023-07-16

## 2023-03-16 MED ORDER — ARIPIPRAZOLE 2 MG PO TABS: 2.0000 mg | ORAL_TABLET | Freq: Every day | ORAL | 0 refills | Status: DC

## 2023-03-16 MED ORDER — ROSUVASTATIN CALCIUM 20 MG PO TABS: ORAL_TABLET | ORAL | 1 refills | Status: DC

## 2023-03-18 ENCOUNTER — Other Ambulatory Visit: Payer: Self-pay | Admitting: Family Medicine

## 2023-03-18 DIAGNOSIS — G43009 Migraine without aura, not intractable, without status migrainosus: Secondary | ICD-10-CM

## 2023-03-18 DIAGNOSIS — F331 Major depressive disorder, recurrent, moderate: Secondary | ICD-10-CM

## 2023-03-18 DIAGNOSIS — J432 Centrilobular emphysema: Secondary | ICD-10-CM

## 2023-03-21 ENCOUNTER — Ambulatory Visit
Admission: RE | Admit: 2023-03-21 | Discharge: 2023-03-21 | Disposition: A | Discharge status: Home / Self Care | Payer: Medicare HMO | Source: Ambulatory Visit | Attending: Neurology | Admitting: Neurology

## 2023-03-21 DIAGNOSIS — R251 Tremor, unspecified: Secondary | ICD-10-CM

## 2023-03-21 DIAGNOSIS — R42 Dizziness and giddiness: Secondary | ICD-10-CM | POA: Diagnosis not present

## 2023-03-23 ENCOUNTER — Ambulatory Visit: Payer: Self-pay

## 2023-03-23 NOTE — Telephone Encounter (Signed)
Copied from CRM [id number]. Topic: General - Inquiry >> Mar 23, 2023 10:32 AM De Blanch wrote: Reason for CRM: Pt stated Dr. Carlynn Purl recommended her to a psychiatrist, but her insurance is not in network with that office. Pt requested a referral be sent to [facility] in [place]. Pt had no further details on this provider.  Please advise.

## 2023-03-23 NOTE — Telephone Encounter (Signed)
Summary: gas issues   Pt stated she stopped taking metFORMIN (GLUCOPHAGE-XR) 500 MG 24 hr tablet for a week and see if it solved her problem; it did not solve her gas issues.  Seeking clinical advice.     Chief Complaint: Pt. Held Metformin x 1 week as instructed by PCP and it did not help her bloating and gas. Asking for advice. Also asking if psychiatry referral was made yet. Symptoms: Above Frequency:  Pertinent Negatives: Patient denies  Disposition: [] ED /[] Urgent Care (no appt availability in office) / [] Appointment(In office/virtual)/ []  [facility]/ [] Home Care/ [] Refused Recommended Disposition /[] [facility]/ [x]  Follow-up with PCP Additional Notes: Please advise pt.  Reason for Disposition  [1] Caller has URGENT medicine question about med that PCP or specialist prescribed AND [2] triager unable to answer question  Answer Assessment - Initial Assessment Questions 1. NAME of MEDICINE: "What medicine(s) are you calling about?"     Metformin  2. QUESTION: "What is your question?" (e.g., double dose of medicine, side effect)     Pt. Held the medicine x 1 week. Did not help her gas and bloating 3. PRESCRIBER: "Who prescribed the medicine?" Reason: if prescribed by specialist, call should be referred to that group.     Dr. Carlynn Purl 4. SYMPTOMS: "Do you have any symptoms?" If Yes, ask: "What symptoms are you having?"  "How bad are the symptoms (e.g., mild, moderate, severe)     Gas, bloating 5. PREGNANCY:  "Is there any chance that you are pregnant?" "When was your last menstrual period?"     No  Protocols used: Medication Question Call-A-AH

## 2023-03-25 ENCOUNTER — Ambulatory Visit: Payer: Medicare HMO | Admitting: Gastroenterology

## 2023-03-25 ENCOUNTER — Other Ambulatory Visit: Payer: Self-pay

## 2023-03-25 ENCOUNTER — Encounter: Payer: Self-pay | Admitting: Gastroenterology

## 2023-03-25 VITALS — BP 109/76 | HR 75 | Temp 98.2°F | Ht 59.0 in | Wt 156.2 lb

## 2023-03-25 DIAGNOSIS — Z1211 Encounter for screening for malignant neoplasm of colon: Secondary | ICD-10-CM

## 2023-03-25 DIAGNOSIS — K5904 Chronic idiopathic constipation: Secondary | ICD-10-CM

## 2023-03-25 DIAGNOSIS — K5900 Constipation, unspecified: Secondary | ICD-10-CM | POA: Diagnosis not present

## 2023-03-25 MED ORDER — NA SULFATE-K SULFATE-MG SULF 17.5-3.13-1.6 GM/177ML PO SOLN: 354.0000 mL | Freq: Once | ORAL | 0 refills | Status: AC

## 2023-03-25 NOTE — Patient Instructions (Signed)
Do clear liquid diet all day tomorrow and then at 4:00pm Mix 64 ounces of Gatorade with 238 grams of miralax. Drink 8oz every 20 minutes till solution is gone.  Gave samples of Linzes 145 take 1 tablet every morning 30 minutes before breakfast.

## 2023-03-25 NOTE — Progress Notes (Signed)
Arlyss Repress, MD [address]  [address]  [place], [place] [address]  Main: [phone]  Fax: [phone]    Gastroenterology Consultation  Referring Provider:     Alba Cory, MD Primary Care Physician:  Alba Cory, MD Primary Gastroenterologist:  Dr. Arlyss Repress Reason for Consultation: Abdominal bloating, gas, lower abdominal discomfort, severe constipation        HPI:   Sara Andrews is a 67 y.o. female referred by Dr. Alba Cory, MD  for consultation & management of symptoms of abdominal bloating, gas, lower abdominal discomfort and severe constipation.  She walked into our office this morning with experiencing severe gas, bloating and discomfort and wanted to be seen.  She was given an appointment for end of the day today.  Patient reports that she always has chronic constipation but has gotten severe lately and feels embarrassed with significant amount of passing gas and feeling bloated.  She also reports lower abdominal discomfort.  She reports trying over-the-counter laxatives, magnesium citrate, MiraLAX which results in runny watery bowel movements and still feels incompletely emptied.  She does not like to eat fruits and vegetables.  She can have broccoli, cabbage and green beans only.  She consumes red meat regularly.  She does drink 1 can of soda daily. Patient did not want to undergo colonoscopy during our discussion at her last visit No known history of anemia, normal TSH, prediabetic  NSAIDs: None  Antiplts/Anticoagulants/Anti thrombotics: None  GI Procedures: EGD 10/24/2022 - Normal duodenal bulb and second portion of the duodenum. - Small hiatal hernia. - Normal stomach. - Esophagogastric landmarks identified. - Normal gastroesophageal junction and esophagus. - No specimens collected.  Past Medical History:  Diagnosis Date   Anxiety    Arthritis    "hips, hands, back" (03/10/2016)   Asthma    Chronic bronchitis (HCC)    COPD  (chronic obstructive pulmonary disease) (HCC)    Cough    Depression    Esophagitis, reflux    GERD (gastroesophageal reflux disease)    Hyperlipidemia    Hypothyroidism    IBS (irritable bowel syndrome)    Lumbago    Muscle pain    Osteoarthritis    Sinus disorder    Sleep apnea    Thyroid disease    Type 2 diabetes, diet controlled (HCC)    Uncomplicated herpes simplex     Past Surgical History:  Procedure Laterality Date   ABDOMINAL HYSTERECTOMY     CARPAL TUNNEL RELEASE Right    COLON SURGERY     ESOPHAGOGASTRODUODENOSCOPY (EGD) WITH PROPOFOL N/A 10/24/2022   Procedure: ESOPHAGOGASTRODUODENOSCOPY (EGD) WITH PROPOFOL;  Surgeon: Toney Reil, MD;  Location: [facility];  Service: Gastroenterology;  Laterality: N/A;   FRACTURE SURGERY     LUMBAR LAMINECTOMY  03/16/2018   L4-5 Laminectomy & Fusion w/ Pedicle Screws, TLIF & Allograft; Surgeon: Virl Diamond, MD; Location: [facility] MAIN OR; Service: Orthopedics; Laterality: N/A; Prone, ProAxis, Stim Neuromonitoring, O-Arm, Cell Saver, Bone Mill, [employer], Justin, PACS on [facility]     OOPHORECTOMY Bilateral    ORIF TIBIA PLATEAU Left 03/10/2016   Procedure: OPEN REDUCTION INTERNAL FIXATION (ORIF) BICONDYLAR TIBIAL PLATEAU FRACTURE;  Surgeon: Eldred Manges, MD;  [facility];  Service: Orthopedics;  Laterality: Left;   SHOULDER ARTHROSCOPY W/ ROTATOR CUFF REPAIR Right    SHOULDER ARTHROSCOPY WITH SUBACROMIAL DECOMPRESSION, ROTATOR CUFF REPAIR AND BICEP TENDON REPAIR Left 05/21/2021   Procedure: SHOULDER ARTHROSCOPY WITH DEBRIDEMENT, DECOMPRESSION, REPAIR OF PARTIAL THICKNESS  ROTATOR CUFF REPAIR, BICEPS TENODESIS;  Surgeon: Christena Flake, MD;  Location: [facility] ORS;  Service: Orthopedics;  Laterality: Left;   TONSILLECTOMY AND ADENOIDECTOMY       Current Outpatient Medications:    albuterol (VENTOLIN HFA) 108 (90 Base) MCG/ACT inhaler, INHALE 2 PUFFS INTO LUNGS EVERY 6 HOURS AS NEEDED FOR WHEEZING OR SHORTNESS OF BREATH, Disp:  6.7 each, Rfl: 0   ARIPiprazole (ABILIFY) 2 MG tablet, Take 1 tablet (2 mg total) by mouth daily., Disp: 90 tablet, Rfl: 0   buPROPion (WELLBUTRIN XL) 150 MG 24 hr tablet, TAKE 1 TABLET(150 MG) BY MOUTH DAILY, Disp: 90 tablet, Rfl: 0   calcium carbonate (OSCAL) 1500 (600 Ca) MG TABS tablet, Take 1,500 mg by mouth 2 (two) times daily with a meal., Disp: , Rfl:    Cholecalciferol (VITAMIN D PO), Take 1,000 Units by mouth daily. 1000 IU, Disp: , Rfl:    clobetasol ointment (TEMOVATE) 0.05 %, Apply 1 Application topically 2 (two) times daily., Disp: 60 g, Rfl: 0   cyanocobalamin (VITAMIN B12) 1000 MCG tablet, Take 1,000 mcg by mouth daily., Disp: , Rfl:    DULoxetine (CYMBALTA) 60 MG capsule, Take 1 capsule (60 mg total) by mouth every morning., Disp: 90 capsule, Rfl: 0   etodolac (LODINE) 500 MG tablet, Take 500 mg by mouth 2 (two) times daily., Disp: , Rfl:    fluticasone (FLONASE) 50 MCG/ACT nasal spray, SHAKE LIQUID AND USE 2 SPRAYS IN EACH NOSTRIL DAILY, Disp: 48 g, Rfl: 0   Fluticasone-Umeclidin-Vilant (TRELEGY ELLIPTA) 100-62.5-25 MCG/ACT AEPB, Inhale 1 puff into the lungs daily., Disp: 180 each, Rfl: 1   gabapentin (NEURONTIN) 300 MG capsule, Take 300 mg by mouth at bedtime., Disp: , Rfl:    levothyroxine (SYNTHROID) 75 MCG tablet, Take 1 tablet (75 mcg total) by mouth daily before breakfast., Disp: 90 tablet, Rfl: 1   loratadine (CLARITIN) 10 MG tablet, TAKE 1 TABLET (10 MG) BY MOUTH EVERY DAY AT BEDTIME, OTC NOT COVERED, Disp: 90 tablet, Rfl: 1   Melatonin 10 MG TABS, Take 10 mg by mouth at bedtime., Disp: , Rfl:    montelukast (SINGULAIR) 10 MG tablet, Take 1 tablet (10 mg total) by mouth at bedtime., Disp: 90 tablet, Rfl: 1   omeprazole (PRILOSEC) 20 MG capsule, Take 20 mg by mouth daily., Disp: , Rfl:    rizatriptan (MAXALT) 10 MG tablet, TAKE 1 TABLET (10 MG TOTAL) BY MOUTH DAILY AS NEEDED FOR MIGRAINE. NO MORE THAN 2 IN 48 HOURS, Disp: 9 tablet, Rfl: 0   rosuvastatin (CRESTOR) 20 MG  tablet, TAKE 1 TABLET(20 MG) BY MOUTH DAILY, Disp: 90 tablet, Rfl: 1   traZODone (DESYREL) 100 MG tablet, TAKE 1 TABLET(100 MG) BY MOUTH AT BEDTIME, Disp: 90 tablet, Rfl: 0   valACYclovir (VALTREX) 500 MG tablet, Take 1 tablet (500 mg total) by mouth daily., Disp: 90 tablet, Rfl: 3   Na Sulfate-K Sulfate-Mg Sulf 17.5-3.13-1.6 GM/177ML SOLN, Take 354 mLs by mouth once for 1 dose., Disp: 354 mL, Rfl: 0   Family History  Problem Relation Age of Onset   Diabetes Father    Heart disease Father    Depression Sister    Alcohol abuse Sister    Anxiety disorder Sister    Bipolar disorder Sister    Pneumonia Sister    Breast cancer Maternal Aunt 73   Breast cancer Paternal Aunt 4   Hypertension Daughter    Breast cancer Daughter    Skin cancer Daughter  Heart attack Daughter    Anxiety disorder Daughter      Social History   Tobacco Use   Smoking status: Former    Current packs/day: 0.00    Average packs/day: 2.0 packs/day for 30.0 years (60.0 ttl pk-yrs)    Types: Cigarettes    Start date: 09/03/1978    Quit date: 09/03/2008    Years since quitting: 14.5   Smokeless tobacco: Never  Vaping Use   Vaping status: Never Used  Substance Use Topics   Alcohol use: No   Drug use: No    Allergies as of 03/25/2023   (No Known Allergies)    Review of Systems:    All systems reviewed and negative except where noted in HPI.   Physical Exam:  BP 109/76 (BP Location: Left Arm, Patient Position: Sitting, Cuff Size: Normal)   Pulse 75   Temp 98.2 F (36.8 C) (Oral)   Ht 4\' 11"  (1.499 m)   Wt 156 lb 4 oz (70.9 kg)   BMI 31.56 kg/m  No LMP recorded. Patient has had a hysterectomy.  General:   Alert,  Well-developed, well-nourished, pleasant and cooperative in NAD Head:  Normocephalic and atraumatic. Eyes:  Sclera clear, no icterus.   Conjunctiva pink. Ears:  Normal auditory acuity. Nose:  No deformity, discharge, or lesions. Mouth:  No deformity or lesions,oropharynx pink &  moist. Neck:  Supple; no masses or thyromegaly. Lungs:  Respirations even and unlabored.  Clear throughout to auscultation.   No wheezes, crackles, or rhonchi. No acute distress. Heart:  Regular rate and rhythm; no murmurs, clicks, rubs, or gallops. Abdomen:  Normal bowel sounds. Soft, non-tender and moderately distended, tympanic without masses, hepatosplenomegaly or hernias noted.  No guarding or rebound tenderness.   Rectal: Not performed Msk:  Symmetrical without gross deformities. Good, equal movement & strength bilaterally. Pulses:  Normal pulses noted. Extremities:  No clubbing or edema.  No cyanosis. Neurologic:  Alert and oriented x3;  grossly normal neurologically. Skin:  Intact without significant lesions or rashes. No jaundice. Psych:  Alert and cooperative. Normal mood and affect.  Imaging Studies: No abdominal imaging  Assessment and Plan:   NARAYANI THUMANN is a 67 y.o. female with history of obesity, hypothyroidism, anxiety, GERD is seen in consultation for severe constipation.  Patient is on several medications that are associated with slow GI motility and constipation as a side effect Her diet is also devoid of fiber Discussed in length regarding healthy eating habits and try to convince her about high-fiber diet Discussed about fiber supplements such as Metamucil or Benefiber or Citrucel daily Recommend cleanout with MiraLAX and 1 day of clear liquid diet Recommend trial of Linzess 145 mcg daily, samples provided Recommend screening colonoscopy with 2-day prep and patient is agreeable to undergo  I have discussed alternative options, risks & benefits,  which include, but are not limited to, bleeding, infection, perforation,respiratory complication & drug reaction.  The patient agrees with this plan & written consent will be obtained.      Follow up as needed   Arlyss Repress, MD

## 2023-04-03 ENCOUNTER — Other Ambulatory Visit: Payer: Self-pay

## 2023-04-03 ENCOUNTER — Telehealth: Payer: Self-pay | Admitting: Family Medicine

## 2023-04-03 DIAGNOSIS — E1169 Type 2 diabetes mellitus with other specified complication: Secondary | ICD-10-CM

## 2023-04-03 MED ORDER — BLOOD GLUCOSE MONITORING SUPPL DEVI
1.0000 | Freq: Three times a day (TID) | 0 refills | Status: DC
Start: 2023-04-03 — End: 2023-04-13

## 2023-04-03 MED ORDER — LANCET DEVICE MISC
1.0000 | Freq: Three times a day (TID) | 0 refills | Status: DC
Start: 2023-04-03 — End: 2023-04-16

## 2023-04-03 MED ORDER — BLOOD GLUCOSE TEST VI STRP
1.0000 | ORAL_STRIP | Freq: Three times a day (TID) | 0 refills | Status: DC
Start: 2023-04-03 — End: 2023-04-16

## 2023-04-03 NOTE — Telephone Encounter (Signed)
Ordered supplies.

## 2023-04-03 NOTE — Telephone Encounter (Signed)
Pt is calling to ask can Dr. Carlynn Purl write a script for a diabetic kit instead of her purchasing. [facility]acy[facility] [place]. [phone]

## 2023-04-05 ENCOUNTER — Other Ambulatory Visit: Payer: Self-pay | Admitting: Family Medicine

## 2023-04-05 DIAGNOSIS — G43009 Migraine without aura, not intractable, without status migrainosus: Secondary | ICD-10-CM

## 2023-04-05 DIAGNOSIS — K219 Gastro-esophageal reflux disease without esophagitis: Secondary | ICD-10-CM

## 2023-04-05 DIAGNOSIS — F331 Major depressive disorder, recurrent, moderate: Secondary | ICD-10-CM

## 2023-04-05 DIAGNOSIS — E1169 Type 2 diabetes mellitus with other specified complication: Secondary | ICD-10-CM

## 2023-04-05 DIAGNOSIS — J432 Centrilobular emphysema: Secondary | ICD-10-CM

## 2023-04-07 ENCOUNTER — Other Ambulatory Visit: Payer: Self-pay | Admitting: Family Medicine

## 2023-04-07 ENCOUNTER — Telehealth: Payer: Self-pay | Admitting: Gastroenterology

## 2023-04-07 DIAGNOSIS — R0981 Nasal congestion: Secondary | ICD-10-CM

## 2023-04-07 MED ORDER — LINACLOTIDE 145 MCG PO CAPS: 145.0000 ug | ORAL_CAPSULE | Freq: Every day | ORAL | 1 refills | Status: DC

## 2023-04-07 NOTE — Telephone Encounter (Signed)
Sent medication to the pharmacy. Called and informed patient that medication was this pharmacy

## 2023-04-07 NOTE — Addendum Note (Signed)
Addended by: Radene Knee L on: 04/07/2023 03:33 PM   Modules accepted: Orders

## 2023-04-07 NOTE — Telephone Encounter (Signed)
Medication Refill - Medication:  fluticasone (FLONASE) 50 MCG/ACT nasal spray  Has the patient contacted their pharmacy? No. Pt last ordered this med 2021.  But pt just saw Dr Carlynn Purl on 9/06 and said dr told her when she needed this to just call.   Preferred Pharmacy (with phone number or street name):  [id number] [place] [place] - [address]   Has the patient been seen for an appointment in the last year OR does the patient have an upcoming appointment? Yes.    Agent: Please be advised that RX refills may take up to 3 business days. We ask that you follow-up with your pharmacy.

## 2023-04-07 NOTE — Telephone Encounter (Signed)
Patient was given samples on 03/25/2023 please advise if I can call in a prescription for patient

## 2023-04-07 NOTE — Telephone Encounter (Signed)
Pt was given samples of linzess 145mg  would [facility] [place]

## 2023-04-08 ENCOUNTER — Ambulatory Visit
Admission: RE | Admit: 2023-04-08 | Discharge: 2023-04-08 | Disposition: A | Discharge status: Home / Self Care | Payer: Medicare HMO | Source: Ambulatory Visit | Attending: Family Medicine | Admitting: Family Medicine

## 2023-04-08 DIAGNOSIS — Z1231 Encounter for screening mammogram for malignant neoplasm of breast: Secondary | ICD-10-CM | POA: Diagnosis not present

## 2023-04-08 MED ORDER — FLUTICASONE PROPIONATE 50 MCG/ACT NA SUSP
2.0000 | Freq: Every day | NASAL | 0 refills | Status: DC
Start: 2023-04-08 — End: 2023-05-11

## 2023-04-08 NOTE — Telephone Encounter (Signed)
Requested medication (s) are due for refill today: yes  Requested medication (s) are on the active medication list: yes  Last refill:  05/14/20  Future visit scheduled: yes  Notes to clinic:  Unable to refill per protocol, routing for approval, last refill 05/14/20.      Requested Prescriptions  Pending Prescriptions Disp Refills   fluticasone (FLONASE) 50 MCG/ACT nasal spray 48 g 0     Ear, Nose, and Throat: Nasal Preparations - Corticosteroids Passed - 04/07/2023  9:44 AM      Passed - Valid encounter within last 12 months    Recent Outpatient Visits           3 weeks ago Dyslipidemia associated with type 2 diabetes mellitus [facility])   [place] [facility] Alba Cory, MD   4 months ago MDD (major depressive disorder), recurrent episode, moderate [facility])   [place] [facility]ba Cory, MD   6 months ago COVID-19   [facility] Mecum, Oswaldo Conroy, PA-C   7 months ago Coarse tremors   [facility] [place], Danna Hefty, MD   7 months ago Dyslipidemia associated with type 2 diabetes mellitus [facility])   [place] [facility] Alba Cory, MD       Future Appointments             In 3 months Carlynn Purl, Danna Hefty, MD [facility], [facility]

## 2023-04-13 ENCOUNTER — Other Ambulatory Visit: Payer: Self-pay | Admitting: Family Medicine

## 2023-04-13 DIAGNOSIS — E1169 Type 2 diabetes mellitus with other specified complication: Secondary | ICD-10-CM

## 2023-04-13 NOTE — Telephone Encounter (Signed)
Medication Refill - Medication:  Pt never received device, only has strips  Has the patient contacted their pharmacy? yes (Agent: If yes, when and what did the pharmacy advise?)contact pcp  Preferred Pharmacy (with phone number or street name):  [id number] [place] - [address] Phone: [phone]  Fax: [phone]     Has the patient been seen for an appointment in the last year OR does the patient have an upcoming appointment? yes  Agent: Please be advised that RX refills may take up to 3 business days. We ask that you follow-up with your pharmacy.

## 2023-04-14 MED ORDER — BLOOD GLUCOSE MONITORING SUPPL DEVI: 1.0000 | Freq: Three times a day (TID) | 0 refills | Status: AC

## 2023-04-14 NOTE — Telephone Encounter (Signed)
Patient did not receive device  Requested Prescriptions  Pending Prescriptions Disp Refills   Blood Glucose Monitoring Suppl DEVI 1 each 0    Sig: 1 each by Does not apply route in the morning, at noon, and at bedtime. May substitute to any manufacturer covered by patient's insurance.     Endocrinology: Diabetes - Testing Supplies Passed - 04/13/2023  2:39 PM      Passed - Valid encounter within last 12 months    Recent Outpatient Visits           4 weeks ago Dyslipidemia associated with type 2 diabetes mellitus [facility])   [place] [facility] Alba Cory, MD   5 months ago MDD (major depressive disorder), recurrent episode, moderate [facility])   [place] [facility] Alba Cory, MD   6 months ago COVID-19   [facility] Mecum, Oswaldo Conroy, PA-C   7 months ago Coarse tremors   [facility], Danna Hefty, MD   8 months ago Dyslipidemia associated with type 2 diabetes mellitus [facility])   [facility] Alba Cory, MD       Future Appointments             In 3 months Carlynn Purl, Danna Hefty, MD [facility], [facility]

## 2023-04-16 ENCOUNTER — Other Ambulatory Visit: Payer: Self-pay | Admitting: Family Medicine

## 2023-04-16 DIAGNOSIS — E1169 Type 2 diabetes mellitus with other specified complication: Secondary | ICD-10-CM

## 2023-04-16 MED ORDER — LANCET DEVICE MISC: 1.0000 | Freq: Three times a day (TID) | 2 refills | Status: AC

## 2023-04-16 MED ORDER — BLOOD GLUCOSE TEST VI STRP: 1.0000 | ORAL_STRIP | Freq: Three times a day (TID) | 2 refills | Status: AC

## 2023-04-16 NOTE — Telephone Encounter (Signed)
[facility] and [facility] to [place] about the refill(s) test strips and lancets requested. Advised it was sent on 04/03/23 and it says may substitute to any manufacturer covered by patient's insurance in the notes. She says if we could resend because all that was received was the meter and the way the Rx is written will cover the device the patient has.

## 2023-04-16 NOTE — Telephone Encounter (Signed)
Requested Prescriptions  Pending Prescriptions Disp Refills   Lancet Device MISC 3 each 2    Sig: 1 each by Does not apply route in the morning, at noon, and at bedtime. May substitute to any manufacturer covered by patient's insurance.     Endocrinology: Diabetes - Testing Supplies Passed - 04/16/2023 11:08 AM      Passed - Valid encounter within last 12 months    Recent Outpatient Visits           1 month ago Dyslipidemia associated with type 2 diabetes mellitus [facility])   [place] [facility] Alba Cory, MD   5 months ago MDD (major depressive disorder), recurrent episode, moderate (HCC)   [place] [facility] Alba Cory, MD   7 months ago COVID-19   [facility] Mecum, Oswaldo Conroy, PA-C   7 months ago Coarse tremors   [facility], Danna Hefty, MD   8 months ago Dyslipidemia associated with type 2 diabetes mellitus [facility])   [place] [facility] Alba Cory, MD       Future Appointments             In 3 months Alba Cory, MD [facility], [facility]             Glucose Blood (BLOOD GLUCOSE TEST STRIPS) STRP 300 each 2    Sig: 1 each by In Vitro route in the morning, at noon, and at bedtime. May substitute to any manufacturer covered by patient's insurance.     Endocrinology: Diabetes - Testing Supplies Passed - 04/16/2023 11:08 AM      Passed - Valid encounter within last 12 months    Recent Outpatient Visits           1 month ago Dyslipidemia associated with type 2 diabetes mellitus [facility])   [place] [facility] Alba Cory, MD   5 months ago MDD (major depressive disorder), recurrent episode, moderate [facility])   [place] [facility] Alba Cory, MD   7 months ago COVID-19   [facility] Mecum, Oswaldo Conroy, PA-C   7 months ago Coarse tremors   [facility] [place], Danna Hefty, MD   8 months ago Dyslipidemia associated with type 2 diabetes mellitus [facility])   [facility] Alba Cory, MD       Future Appointments             In 3 months Carlynn Purl, Danna Hefty, MD [facility], [facility]

## 2023-04-16 NOTE — Telephone Encounter (Signed)
Medication Refill - Medication:   STRIPS for Blood sugar Device  NEEDLES for Blood Sugar Device.  LANCETS for Blood Sugar Device    Patient states she received her blood sugar device but her Strips, Needles, Lancets do not match her device, ONE TOUCH ZERO FLEX DEVICE.  Has the patient contacted their pharmacy? No.  Preferred Pharmacy (with phone number or street name):  [id number] [place] - [address]  Phone: [phone] Fax: [phone]    Has the patient been seen for an appointment in the last year OR does the patient have an upcoming appointment? YES. F/U on 12.19.2024

## 2023-04-30 ENCOUNTER — Other Ambulatory Visit: Payer: Self-pay

## 2023-04-30 ENCOUNTER — Other Ambulatory Visit: Payer: Self-pay | Admitting: Family Medicine

## 2023-04-30 ENCOUNTER — Encounter: Payer: Self-pay | Admitting: Gastroenterology

## 2023-04-30 DIAGNOSIS — E1169 Type 2 diabetes mellitus with other specified complication: Secondary | ICD-10-CM

## 2023-05-05 ENCOUNTER — Other Ambulatory Visit: Payer: Self-pay | Admitting: Family Medicine

## 2023-05-05 DIAGNOSIS — G43009 Migraine without aura, not intractable, without status migrainosus: Secondary | ICD-10-CM

## 2023-05-06 ENCOUNTER — Telehealth: Payer: Self-pay | Admitting: Gastroenterology

## 2023-05-06 ENCOUNTER — Encounter: Payer: Self-pay | Admitting: Gastroenterology

## 2023-05-06 NOTE — Telephone Encounter (Signed)
Informed patient of this information and she verbalized understanding she will start the Miralax prep now and do the regular prep at 5:00pm. She will have colonoscopy tomorrow

## 2023-05-06 NOTE — Telephone Encounter (Signed)
Patient and her called in granddaughter Victorino Dike) stated her grandmother didn't do the low fiber diet and Miralax. The patient have ate regular food yesterday at 6:00 pm. They want to know if she needs to reschedule her procedure. Her procedure is tomorrow.

## 2023-05-07 ENCOUNTER — Encounter
Admission: RE | Disposition: A | Discharge status: Home / Self Care | Payer: Self-pay | Source: Home / Self Care | Attending: Gastroenterology

## 2023-05-07 ENCOUNTER — Ambulatory Visit: Payer: Medicare HMO | Admitting: Anesthesiology

## 2023-05-07 ENCOUNTER — Other Ambulatory Visit: Payer: Self-pay

## 2023-05-07 ENCOUNTER — Ambulatory Visit
Admission: RE | Admit: 2023-05-07 | Discharge: 2023-05-07 | Disposition: A | Discharge status: Home / Self Care | Payer: Medicare HMO | Attending: Gastroenterology | Admitting: Gastroenterology

## 2023-05-07 DIAGNOSIS — K635 Polyp of colon: Secondary | ICD-10-CM | POA: Diagnosis not present

## 2023-05-07 DIAGNOSIS — J449 Chronic obstructive pulmonary disease, unspecified: Secondary | ICD-10-CM | POA: Insufficient documentation

## 2023-05-07 DIAGNOSIS — E039 Hypothyroidism, unspecified: Secondary | ICD-10-CM | POA: Diagnosis not present

## 2023-05-07 DIAGNOSIS — I2510 Atherosclerotic heart disease of native coronary artery without angina pectoris: Secondary | ICD-10-CM | POA: Diagnosis not present

## 2023-05-07 DIAGNOSIS — K219 Gastro-esophageal reflux disease without esophagitis: Secondary | ICD-10-CM | POA: Diagnosis not present

## 2023-05-07 DIAGNOSIS — K589 Irritable bowel syndrome without diarrhea: Secondary | ICD-10-CM | POA: Diagnosis not present

## 2023-05-07 DIAGNOSIS — E119 Type 2 diabetes mellitus without complications: Secondary | ICD-10-CM | POA: Insufficient documentation

## 2023-05-07 DIAGNOSIS — Z1211 Encounter for screening for malignant neoplasm of colon: Secondary | ICD-10-CM

## 2023-05-07 DIAGNOSIS — K649 Unspecified hemorrhoids: Secondary | ICD-10-CM | POA: Diagnosis not present

## 2023-05-07 DIAGNOSIS — K5730 Diverticulosis of large intestine without perforation or abscess without bleeding: Secondary | ICD-10-CM | POA: Diagnosis not present

## 2023-05-07 DIAGNOSIS — G4730 Sleep apnea, unspecified: Secondary | ICD-10-CM | POA: Insufficient documentation

## 2023-05-07 DIAGNOSIS — Z87891 Personal history of nicotine dependence: Secondary | ICD-10-CM | POA: Diagnosis not present

## 2023-05-07 DIAGNOSIS — D120 Benign neoplasm of cecum: Secondary | ICD-10-CM | POA: Diagnosis not present

## 2023-05-07 DIAGNOSIS — K644 Residual hemorrhoidal skin tags: Secondary | ICD-10-CM | POA: Insufficient documentation

## 2023-05-07 HISTORY — PX: COLONOSCOPY WITH PROPOFOL: SHX5780

## 2023-05-07 HISTORY — PX: POLYPECTOMY: SHX5525

## 2023-05-07 HISTORY — PX: HEMOSTASIS CLIP PLACEMENT: SHX6857

## 2023-05-07 HISTORY — PX: SUBMUCOSAL LIFTING INJECTION: SHX6855

## 2023-05-07 LAB — GLUCOSE, CAPILLARY: Glucose-Capillary: 85 mg/dL (ref 70–99)

## 2023-05-07 SURGERY — COLONOSCOPY WITH PROPOFOL
Anesthesia: General

## 2023-05-07 MED ORDER — SODIUM CHLORIDE 0.9 % IV SOLN: INTRAVENOUS | Status: DC

## 2023-05-07 MED ORDER — LIDOCAINE HCL (PF) 2 % IJ SOLN
INTRAMUSCULAR | Status: AC
  Filled 2023-05-07: qty 5

## 2023-05-07 MED ORDER — PROPOFOL 1000 MG/100ML IV EMUL
INTRAVENOUS | Status: AC
  Filled 2023-05-07: qty 100

## 2023-05-07 MED ORDER — LIDOCAINE HCL (CARDIAC) PF 100 MG/5ML IV SOSY
PREFILLED_SYRINGE | INTRAVENOUS | Status: DC | PRN
  Administered 2023-05-07: 50 mg via INTRAVENOUS

## 2023-05-07 MED ORDER — PROPOFOL 10 MG/ML IV BOLUS
INTRAVENOUS | Status: DC | PRN
  Administered 2023-05-07: 30 mg via INTRAVENOUS
  Administered 2023-05-07: 60 mg via INTRAVENOUS

## 2023-05-07 MED ORDER — PROPOFOL 500 MG/50ML IV EMUL
INTRAVENOUS | Status: DC | PRN
  Administered 2023-05-07: 125 ug/kg/min via INTRAVENOUS

## 2023-05-07 NOTE — Transfer of Care (Signed)
Immediate Anesthesia Transfer of Care Note  Patient: Sara Andrews  Procedure(s) Performed: COLONOSCOPY WITH PROPOFOL  Patient Location: PACU  Anesthesia Type:General  Level of Consciousness: sedated  Airway & Oxygen Therapy: Patient Spontanous Breathing  Post-op Assessment: Report given to RN and Post -op Vital signs reviewed and stable  Post vital signs: Reviewed and stable  Last Vitals:  Vitals Value Taken Time  BP 111/77 05/07/23 0930  Temp 36.3 C 05/07/23 0929  Pulse 92 05/07/23 0931  Resp 21 05/07/23 0931  SpO2 100 % 05/07/23 0931  Vitals shown include unfiled device data.  Last Pain:  Vitals:   05/07/23 0929  TempSrc: Temporal  PainSc: Asleep         Complications: No notable events documented.

## 2023-05-07 NOTE — Anesthesia Preprocedure Evaluation (Signed)
Anesthesia Evaluation  Patient identified by MRN, date of birth, ID band Patient awake    Reviewed: Allergy & Precautions, H&P , NPO status , Patient's Chart, lab work & pertinent test results, reviewed documented beta blocker date and time   Airway Mallampati: II   Neck ROM: full    Dental  (+) Poor Dentition   Pulmonary asthma , sleep apnea , COPD, Patient did not abstain from smoking., former smoker   Pulmonary exam normal        Cardiovascular Exercise Tolerance: Poor + CAD and + Peripheral Vascular Disease  Normal cardiovascular exam Rhythm:regular Rate:Normal     Neuro/Psych  PSYCHIATRIC DISORDERS Anxiety Depression     Neuromuscular disease    GI/Hepatic Neg liver ROS, hiatal hernia,GERD  ,,  Endo/Other  diabetesHypothyroidism    Renal/GU negative Renal ROS  negative genitourinary   Musculoskeletal   Abdominal   Peds  Hematology negative hematology ROS (+)   Anesthesia Other Findings Past Medical History: No date: Anxiety No date: Arthritis     Comment:  "hips, hands, back" (03/10/2016) No date: Asthma No date: Chronic bronchitis (HCC) No date: COPD (chronic obstructive pulmonary disease) (HCC) No date: Cough No date: Depression No date: Esophagitis, reflux No date: GERD (gastroesophageal reflux disease) No date: Hyperlipidemia No date: Hypothyroidism No date: IBS (irritable bowel syndrome) No date: Lumbago No date: Muscle pain No date: Osteoarthritis No date: Sinus disorder No date: Sleep apnea No date: Thyroid disease No date: Type 2 diabetes, diet controlled (HCC) No date: Uncomplicated herpes simplex Past Surgical History: No date: ABDOMINAL HYSTERECTOMY No date: CARPAL TUNNEL RELEASE; Right No date: COLON SURGERY 10/24/2022: ESOPHAGOGASTRODUODENOSCOPY (EGD) WITH PROPOFOL; N/A     Comment:  Procedure: ESOPHAGOGASTRODUODENOSCOPY (EGD) WITH               PROPOFOL;  Surgeon: Toney Reil, MD;  Location:               [facility];  Service: Gastroenterology;  Laterality:               N/A; No date: FRACTURE SURGERY 03/16/2018: LUMBAR LAMINECTOMY     Comment:  L4-5 Laminectomy & Fusion w/ Pedicle Screws, TLIF &               Allograft; Surgeon: Virl Diamond, MD; Location: [facility]              MAIN OR; Service: Orthopedics; Laterality: N/A; Prone,               ProAxis, Stim Neuromonitoring, O-Arm, Cell Saver, Bone               Mill, [employer], Justin, PACS on [facility]   No date: OOPHORECTOMY; Bilateral 03/10/2016: ORIF TIBIA PLATEAU; Left     Comment:  Procedure: OPEN REDUCTION INTERNAL FIXATION (ORIF)               BICONDYLAR TIBIAL PLATEAU FRACTURE;  Surgeon: Eldred Manges, MD;  [facility];  Service: Orthopedics;                Laterality: Left; No date: SHOULDER ARTHROSCOPY W/ ROTATOR CUFF REPAIR; Right 05/21/2021: SHOULDER ARTHROSCOPY WITH SUBACROMIAL DECOMPRESSION,  ROTATOR CUFF REPAIR AND BICEP TENDON REPAIR; Left     Comment:  Procedure: SHOULDER ARTHROSCOPY WITH DEBRIDEMENT,               DECOMPRESSION, REPAIR OF PARTIAL  THICKNESS ROTATOR CUFF               REPAIR, BICEPS TENODESIS;  Surgeon: Christena Flake, MD;                Location: [facility] ORS;  Service: Orthopedics;  Laterality:               Left; No date: TONSILLECTOMY AND ADENOIDECTOMY BMI    Body Mass Index: 32.11 kg/m     Reproductive/Obstetrics negative OB ROS                             Anesthesia Physical Anesthesia Plan  ASA: 3  Anesthesia Plan: General   Post-op Pain Management:    Induction:   PONV Risk Score and Plan:   Airway Management Planned:   Additional Equipment:   Intra-op Plan:   Post-operative Plan:   Informed Consent: I have reviewed the patients History and Physical, chart, labs and discussed the procedure including the risks, benefits and alternatives for the proposed anesthesia with the patient or authorized  representative who has indicated his/her understanding and acceptance.     Dental Advisory Given  Plan Discussed with: CRNA  Anesthesia Plan Comments:        Anesthesia Quick Evaluation

## 2023-05-07 NOTE — H&P (Signed)
Arlyss Repress, MD [address]  [address]  [place], [place] [address]  Main: [phone]  Fax: [phone] Pager: [phone]  Primary Care Physician:  Alba Cory, MD Primary Gastroenterologist:  Dr. Arlyss Repress  Pre-Procedure History & Physical: HPI:  Sara Andrews is a 67 y.o. female is here for an colonoscopy.   Past Medical History:  Diagnosis Date   Anxiety    Arthritis    "hips, hands, back" (03/10/2016)   Asthma    Chronic bronchitis (HCC)    COPD (chronic obstructive pulmonary disease) (HCC)    Cough    Depression    Esophagitis, reflux    GERD (gastroesophageal reflux disease)    Hyperlipidemia    Hypothyroidism    IBS (irritable bowel syndrome)    Lumbago    Muscle pain    Osteoarthritis    Sinus disorder    Sleep apnea    Thyroid disease    Type 2 diabetes, diet controlled (HCC)    Uncomplicated herpes simplex     Past Surgical History:  Procedure Laterality Date   ABDOMINAL HYSTERECTOMY     CARPAL TUNNEL RELEASE Right    COLON SURGERY     ESOPHAGOGASTRODUODENOSCOPY (EGD) WITH PROPOFOL N/A 10/24/2022   Procedure: ESOPHAGOGASTRODUODENOSCOPY (EGD) WITH PROPOFOL;  Surgeon: Toney Reil, MD;  Location: [facility] END[facility];  Service: Gastroenterology;  Laterality: N/A;   FRACTURE SURGERY     LUMBAR LAMINECTOMY  03/16/2018   L4-5 Laminectomy & Fusion w/ Pedicle Screws, TLIF & Allograft; Surgeon: Virl Diamond, MD; Location: [facility] MAIN OR; Service: Orthopedics; Laterality: N/A; Prone, ProAxis, Stim Neuromonitoring, O-Arm, Cell Saver, [employer], [employer], Justin, PACS on [facility]     OOPHORECTOMY Bilateral    ORIF TIBIA PLATEAU Left 03/10/2016   Procedure: OPEN REDUCTION INTERNAL FIXATION (ORIF) BICONDYLAR TIBIAL PLATEAU FRACTURE;  Surgeon: Eldred Manges, MD;  Location: [facility] OR;  Service: Orthopedics;  Laterality: Left;   SHOULDER ARTHROSCOPY W/ ROTATOR CUFF REPAIR Right    SHOULDER ARTHROSCOPY WITH SUBACROMIAL DECOMPRESSION, ROTATOR CUFF  REPAIR AND BICEP TENDON REPAIR Left 05/21/2021   Procedure: SHOULDER ARTHROSCOPY WITH DEBRIDEMENT, DECOMPRESSION, REPAIR OF PARTIAL THICKNESS ROTATOR CUFF REPAIR, BICEPS TENODESIS;  Surgeon: Christena Flake, MD;  Location: [facility] ORS;  Service: Orthopedics;  Laterality: Left;   TONSILLECTOMY AND ADENOIDECTOMY      Prior to Admission medications   Medication Sig Start Date End Date Taking? Authorizing Provider  ARIPiprazole (ABILIFY) 2 MG tablet Take 1 tablet (2 mg total) by mouth daily. 03/16/23  Yes Sowles, Danna Hefty, MD  buPROPion (WELLBUTRIN XL) 150 MG 24 hr tablet TAKE 1 TABLET(150 MG) BY MOUTH DAILY 03/16/23  Yes Sowles, Danna Hefty, MD  DULoxetine (CYMBALTA) 60 MG capsule Take 1 capsule (60 mg total) by mouth every morning. 03/16/23  Yes Sowles, Danna Hefty, MD  levothyroxine (SYNTHROID) 75 MCG tablet Take 1 tablet (75 mcg total) by mouth daily before breakfast. 03/16/23  Yes Sowles, Danna Hefty, MD  rosuvastatin (CRESTOR) 20 MG tablet TAKE 1 TABLET(20 MG) BY MOUTH DAILY 03/16/23  Yes Sowles, Danna Hefty, MD  valACYclovir (VALTREX) 500 MG tablet Take 1 tablet (500 mg total) by mouth daily. 03/16/23  Yes Sowles, Danna Hefty, MD  albuterol (VENTOLIN HFA) 108 (90 Base) MCG/ACT inhaler INHALE 2 PUFFS INTO LUNGS EVERY 6 HOURS AS NEEDED FOR WHEEZING OR SHORTNESS OF BREATH 03/18/23   Alba Cory, MD  Blood Glucose Monitoring Suppl DEVI 1 each by Does not apply route in the morning, at noon, and at bedtime. May substitute to any manufacturer  covered by patient's insurance. 04/14/23   Alba Cory, MD  calcium carbonate (OSCAL) 1500 (600 Ca) MG TABS tablet Take 1,500 mg by mouth 2 (two) times daily with a meal.    [provider]  Cholecalciferol (VITAMIN D PO) Take 1,000 Units by mouth daily. 1000 IU    [provider]  clobetasol ointment (TEMOVATE) 0.05 % Apply 1 Application topically 2 (two) times daily. 12/25/22   McDonald, Rachelle Hora, DPM  cyanocobalamin (VITAMIN B12) 1000 MCG tablet Take 1,000 mcg by mouth  daily.    [provider]  etodolac (LODINE) 500 MG tablet Take 500 mg by mouth 2 (two) times daily. 05/10/22   [provider]  fluticasone (FLONASE) 50 MCG/ACT nasal spray Place 2 sprays into both nostrils daily. 04/08/23   Alba Cory, MD  Fluticasone-Umeclidin-Vilant (TRELEGY ELLIPTA) 100-62.5-25 MCG/ACT AEPB Inhale 1 puff into the lungs daily. 03/16/23   Alba Cory, MD  gabapentin (NEURONTIN) 300 MG capsule Take 300 mg by mouth at bedtime.    Merri Ray, MD  Glucose Blood (BLOOD GLUCOSE TEST STRIPS) STRP 1 each by In Vitro route in the morning, at noon, and at bedtime. May substitute to any manufacturer covered by patient's insurance. 04/16/23   Alba Cory, MD  Lancet Device MISC 1 each by Does not apply route in the morning, at noon, and at bedtime. May substitute to any manufacturer covered by patient's insurance. 04/16/23   Alba Cory, MD  linaclotide Karlene Einstein) 145 MCG CAPS capsule Take 1 capsule (145 mcg total) by mouth daily before breakfast. 04/07/23   Neilani Duffee, Loel Dubonnet, MD  loratadine (CLARITIN) 10 MG tablet TAKE 1 TABLET (10 MG) BY MOUTH EVERY DAY AT BEDTIME, OTC NOT COVERED 03/16/23   Alba Cory, MD  Melatonin 10 MG TABS Take 10 mg by mouth at bedtime.    [provider]  montelukast (SINGULAIR) 10 MG tablet Take 1 tablet (10 mg total) by mouth at bedtime. 03/16/23   Alba Cory, MD  omeprazole (PRILOSEC) 20 MG capsule Take 20 mg by mouth daily.    Toney Reil, MD  rizatriptan (MAXALT) 10 MG tablet TAKE 1 TABLET (10 MG TOTAL) BY MOUTH DAILY AS NEEDED FOR MIGRAINE. NO MORE THAN 2 IN 18 HOURS 05/05/23   Carlynn Purl, Danna Hefty, MD  traZODone (DESYREL) 100 MG tablet TAKE 1 TABLET(100 MG) BY MOUTH AT BEDTIME 03/16/23   Alba Cory, MD    Allergies as of 03/26/2023   (No Known Allergies)    Family History  Problem Relation Age of Onset   Diabetes Father    Heart disease Father    Depression Sister    Alcohol abuse Sister     Anxiety disorder Sister    Bipolar disorder Sister    Pneumonia Sister    Breast cancer Maternal Aunt 32   Breast cancer Paternal Aunt 48   Hypertension Daughter    Breast cancer Daughter    Skin cancer Daughter    Heart attack Daughter    Anxiety disorder Daughter     Social History   Socioeconomic History   Marital status: Widowed    Spouse name: Not on file   Number of children: 3   Years of education: Not on file   Highest education level: 10th grade  Occupational History   Occupation: disabled    Comment: retired  Tobacco Use   Smoking status: Former    Current packs/day: 0.00    Average packs/day: 2.0 packs/day for 30.0 years (60.0 ttl pk-yrs)  Types: Cigarettes    Start date: 09/03/1978    Quit date: 09/03/2008    Years since quitting: 14.6   Smokeless tobacco: Never  Vaping Use   Vaping status: Never Used  Substance and Sexual Activity   Alcohol use: No   Drug use: No   Sexual activity: Never  Other Topics Concern   Not on file  Social History Narrative   She used to work at  [employer], but had a left knee work related injury 03/10/2016 , require left knee surgery and is now having back pain, that is getting evaluated, Out of work since.       Patient recently found out that one of her twins [name]) was diagnosed with breast cancer); will be having a double mastectomy soon.      Pt lives alone   Social Determinants of Health   Financial Resource Strain: Low Risk  (07/17/2022)   Overall Financial Resource Strain (CARDIA)    Difficulty of Paying Living Expenses: Not hard at all  Food Insecurity: No Food Insecurity (07/17/2022)   Hunger Vital Sign    Worried About Running Out of Food in the Last Year: Never true    Ran Out of Food in the Last Year: Never true  Transportation Needs: No Transportation Needs (07/17/2022)   PRAPARE - Administrator, Civil Service (Medical): No    Lack of Transportation (Non-Medical): No  Physical Activity:  Inactive (07/17/2022)   Exercise Vital Sign    Days of Exercise per Week: 0 days    Minutes of Exercise per Session: 0 min  Stress: No Stress Concern Present (07/17/2022)   [employer] of Occupational Health - Occupational Stress Questionnaire    Feeling of Stress : Only a little  Social Connections: Socially Isolated (07/17/2022)   Social Connection and Isolation Panel [NHANES]    Frequency of Communication with Friends and Family: More than three times a week    Frequency of Social Gatherings with Friends and Family: Never    Attends Religious Services: Never    Database administrator or Organizations: No    Attends Banker Meetings: Never    Marital Status: Widowed  Intimate Partner Violence: Not At Risk (07/16/2021)   Humiliation, Afraid, Rape, and Kick questionnaire    Fear of Current or Ex-Partner: No    Emotionally Abused: No    Physically Abused: No    Sexually Abused: No    Review of Systems: See HPI, otherwise negative ROS  Physical Exam: BP (!) 113/55   Pulse 88   Temp 97.8 F (36.6 C) (Oral)   Resp 14   Ht 4\' 9"  (1.448 m)   Wt 67.3 kg   SpO2 97%   BMI 32.11 kg/m  General:   Alert,  pleasant and cooperative in NAD Head:  Normocephalic and atraumatic. Neck:  Supple; no masses or thyromegaly. Lungs:  Clear throughout to auscultation.    Heart:  Regular rate and rhythm. Abdomen:  Soft, nontender and nondistended. Normal bowel sounds, without guarding, and without rebound.   Neurologic:  Alert and  oriented x4;  grossly normal neurologically.  Impression/Plan: THRESSA SHIFFER is here for an colonoscopy to be performed for colon cancer screening  Risks, benefits, limitations, and alternatives regarding  colonoscopy have been reviewed with the patient.  Questions have been answered.  All parties agreeable.   Lannette Donath, MD  05/07/2023, 8:48 AM

## 2023-05-07 NOTE — Anesthesia Postprocedure Evaluation (Signed)
Anesthesia Post Note  Patient: Sara Andrews  Procedure(s) Performed: COLONOSCOPY WITH PROPOFOL  Patient location during evaluation: PACU Anesthesia Type: General Level of consciousness: awake and alert Pain management: pain level controlled Vital Signs Assessment: post-procedure vital signs reviewed and stable Respiratory status: spontaneous breathing, nonlabored ventilation, respiratory function stable and patient connected to nasal cannula oxygen Cardiovascular status: blood pressure returned to baseline and stable Postop Assessment: no apparent nausea or vomiting Anesthetic complications: no   No notable events documented.   Last Vitals:  Vitals:   05/07/23 0939 05/07/23 0949  BP: 98/65 107/66  Pulse:    Resp:    Temp:    SpO2:      Last Pain:  Vitals:   05/07/23 0949  TempSrc:   PainSc: 0-No pain                 Yevette Edwards

## 2023-05-07 NOTE — Op Note (Signed)
[facility] Gastroenterology Patient Name: Sara Andrews Procedure Date: 05/07/2023 8:52 AM MRN: [id number] Account #: [id number] Date of Birth: Jun 22, 1956 Admit Type: Outpatient Age: 67 Room: [facility] ENDO ROOM 3 Gender: Female Note Status: Finalized Instrument Name: Peds Colonoscope [id number] Procedure:             Colonoscopy Indications:           Screening for colorectal malignant neoplasm Providers:             Toney Reil MD, MD Referring MD:          Onnie Boer. Sowles, MD (Referring MD) Medicines:             General Anesthesia Complications:         No immediate complications. Estimated blood loss: None. Procedure:             Pre-Anesthesia Assessment:                        - Prior to the procedure, a History and Physical was                         performed, and patient medications and allergies were                         reviewed. The patient is competent. The risks and                         benefits of the procedure and the sedation options and                         risks were discussed with the patient. All questions                         were answered and informed consent was obtained.                         Patient identification and proposed procedure were                         verified by the physician, the nurse, the                         anesthesiologist, the anesthetist and the technician                         in the pre-procedure area in the procedure room in the                         endoscopy suite. Mental Status Examination: alert and                         oriented. Airway Examination: normal oropharyngeal                         airway and neck mobility. Respiratory Examination:                         clear to auscultation. CV Examination: normal.  Prophylactic Antibiotics: The patient does not require                         prophylactic antibiotics. Prior Anticoagulants: The                          patient has taken no anticoagulant or antiplatelet                         agents. ASA Grade Assessment: II - A patient with mild                         systemic disease. After reviewing the risks and                         benefits, the patient was deemed in satisfactory                         condition to undergo the procedure. The anesthesia                         plan was to use general anesthesia. Immediately prior                         to administration of medications, the patient was                         re-assessed for adequacy to receive sedatives. The                         heart rate, respiratory rate, oxygen saturations,                         blood pressure, adequacy of pulmonary ventilation, and                         response to care were monitored throughout the                         procedure. The physical status of the patient was                         re-assessed after the procedure.                        After obtaining informed consent, the colonoscope was                         passed under direct vision. Throughout the procedure,                         the patient's blood pressure, pulse, and oxygen                         saturations were monitored continuously. The                         Colonoscope was introduced through the anus and  advanced to the the cecum, identified by appendiceal                         orifice and ileocecal valve. The colonoscopy was                         unusually difficult due to multiple diverticula in the                         colon. Successful completion of the procedure was                         aided by applying abdominal pressure. The patient                         tolerated the procedure well. The quality of the bowel                         preparation was evaluated using the BBPS [facility] Bowel                         Preparation Scale) with scores of: Right Colon = 3,                          Transverse Colon = 3 and Left Colon = 3 (entire mucosa                         seen well with no residual staining, small fragments                         of stool or opaque liquid). The total BBPS score                         equals 9. The ileocecal valve, appendiceal orifice,                         and rectum were photographed. Findings:      The perianal and digital rectal examinations were normal. Pertinent       negatives include normal sphincter tone and no palpable rectal lesions.      A 9 mm polyp was found in the cecum. The polyp was sessile. The polyp       was removed with a hot snare. Resection and retrieval were complete.       Estimated blood loss: none.      A 15 mm polyp was found in the cecum. The polyp was carpet-like and       flat. Preparations were made for mucosal resection. Demarcation of the       lesion was performed with narrow band imaging to clearly identify the       boundaries of the lesion. Eleview was injected to raise the lesion.       Snare mucosal resection was performed. Resection and retrieval were       complete. Resected tissue including tissue margins will be examined by       histology. To prevent bleeding after mucosal resection, one hemostatic       clip was successfully placed (MR safe). Clip manufacturer: [employer]  [employer]. There was no bleeding during, or at the end, of the       procedure.      Multiple small-mouthed diverticula were found in the recto-sigmoid colon.      Non-bleeding external hemorrhoids were found during retroflexion. The       hemorrhoids were large.      The exam was otherwise without abnormality. Impression:            - One 9 mm polyp in the cecum, removed with a hot                         snare. Resected and retrieved.                        - One 15 mm polyp in the cecum, removed with mucosal                         resection. Resected and retrieved. Clip manufacturer:                          [employer]. Clip (MR safe) was placed.                        - Diverticulosis in the recto-sigmoid colon.                        - Non-bleeding external hemorrhoids.                        - The examination was otherwise normal.                        - Mucosal resection was performed. Resection and                         retrieval were complete. Recommendation:        - Discharge patient to home (with escort).                        - Resume previous diet today.                        - Continue present medications.                        - Await pathology results.                        - Repeat colonoscopy in 3 years for surveillance. Procedure Code(s):     --- Professional ---                        [phone], Colonoscopy, flexible; with endoscopic mucosal                         resection                        [id number], 59, Colonoscopy, flexible; with removal of  tumor(s), polyp(s), or other lesion(s) by snare                         technique Diagnosis Code(s):     --- Professional ---                        Z12.11, Encounter for screening for malignant neoplasm                         of colon                        D12.0, Benign neoplasm of cecum                        K64.4, Residual hemorrhoidal skin tags                        K57.30, Diverticulosis of large intestine without                         perforation or abscess without bleeding CPT copyright 2022 American Medical Association. All rights reserved. The codes documented in this report are preliminary and upon coder review may  be revised to meet current compliance requirements. Dr. Libby Maw Toney Reil MD, MD 05/07/2023 9:29:47 AM This report has been signed electronically. Number of Addenda: 0 Note Initiated On: 05/07/2023 8:52 AM Scope Withdrawal Time: 0 hours 12 minutes 46 seconds  Total Procedure Duration: 0 hours 24 minutes 51 seconds  Estimated Blood Loss:   Estimated blood loss: none.      [facility]

## 2023-05-08 ENCOUNTER — Encounter: Payer: Self-pay | Admitting: Gastroenterology

## 2023-05-08 LAB — SURGICAL PATHOLOGY

## 2023-05-10 ENCOUNTER — Other Ambulatory Visit: Payer: Self-pay | Admitting: Family Medicine

## 2023-05-10 ENCOUNTER — Encounter: Payer: Self-pay | Admitting: Gastroenterology

## 2023-05-10 DIAGNOSIS — R0981 Nasal congestion: Secondary | ICD-10-CM

## 2023-05-10 DIAGNOSIS — G43009 Migraine without aura, not intractable, without status migrainosus: Secondary | ICD-10-CM

## 2023-05-10 DIAGNOSIS — F331 Major depressive disorder, recurrent, moderate: Secondary | ICD-10-CM

## 2023-05-10 DIAGNOSIS — J432 Centrilobular emphysema: Secondary | ICD-10-CM

## 2023-05-28 ENCOUNTER — Other Ambulatory Visit: Payer: Self-pay | Admitting: Family Medicine

## 2023-05-28 DIAGNOSIS — G43009 Migraine without aura, not intractable, without status migrainosus: Secondary | ICD-10-CM

## 2023-06-06 DIAGNOSIS — S82102B Unspecified fracture of upper end of left tibia, initial encounter for open fracture type I or II: Secondary | ICD-10-CM | POA: Diagnosis not present

## 2023-06-06 DIAGNOSIS — M25562 Pain in left knee: Secondary | ICD-10-CM | POA: Diagnosis not present

## 2023-06-06 DIAGNOSIS — M7052 Other bursitis of knee, left knee: Secondary | ICD-10-CM | POA: Diagnosis not present

## 2023-06-16 DIAGNOSIS — R251 Tremor, unspecified: Secondary | ICD-10-CM | POA: Diagnosis not present

## 2023-06-16 DIAGNOSIS — R4189 Other symptoms and signs involving cognitive functions and awareness: Secondary | ICD-10-CM | POA: Diagnosis not present

## 2023-07-01 ENCOUNTER — Encounter: Payer: Self-pay | Admitting: Family Medicine

## 2023-07-01 NOTE — Telephone Encounter (Signed)
 Care team updated and letter sent for eye exam notes.

## 2023-07-02 ENCOUNTER — Other Ambulatory Visit: Payer: Self-pay | Admitting: Family Medicine

## 2023-07-02 DIAGNOSIS — G43009 Migraine without aura, not intractable, without status migrainosus: Secondary | ICD-10-CM

## 2023-07-03 NOTE — Progress Notes (Signed)
Name: Sara Andrews   MRN: [id number]    DOB: 06-May-1956   Date:07/16/2023       Progress Note  Subjective  Chief Complaint  Chief Complaint  Patient presents with   Medical Management of Chronic Issues    HPI  Parkinson's disease: under the care of Dr. Sherryll Burger, she was recently started on Sinemet but is causing a lot of nausea, advised to contact his office to discuss other options. She has slow gait and hand tremors, occasionally has balance problems  Major Depression:  no longer seeing Dr. Elna Breslow but taking medication as prescribed - Duloxetine and Wellbutrin during the day and Trazodone at night, she was feeling anxious and picking on her scalp, we added Abilify and she is doing better now. She is still depressed and wants to see a psychiatrist however unable to find a provider locally that is in network. Stress is higher now that sister was diagnosed with lung cancer    Recurrent headaches: doing well on prn Maxalt , she states not as frequent, she described pain as throbbing, photophobia, eye pain during episodes. Episodes are seldom and responds well to medication . Unchanged    DDD and spondylolisthesis/Neck pain : doing well since surgery done 02/2018. She is off pain  medication, taking prn tylenol only and since pain is under control, she used to see Dr. Noel Gerold, now seeing Dr. Council Mechanic, she had some steroid injections. Pain is still 4/10 , difficulty cleaning without taking breaks    RLS: only at night, she has to stand up and walk around and it improves, she is taking magnesium. Requip made symptoms worse. Unchanged    Emphysema/COPD overlapping with asthma : she takes Trelegy daily, she states her SOB is stable, no  wheezing however she has a nocturnal cough - advised to resume PPI , raise the head of the bed, let me know if it improves    Recurrent scalp pruritis: resolves since started taking Valtrex every night, she wants to continue taking it. Discussed medication for  seborrhea but she does not want to change medication    GERD/hiatal hernia: she is no longer taking Ozempic, able to tolerate lower dose PPI, Dr. Allegra Lai advised to stay on it for the rest of her life. Since colonoscopy no longer having a lot of gas, also taking linzess prn now   Hypothyroidism: She is currently taking 75 mcg daily and half on Mondays, last TSH was at goal. She will return to have it repeated in January    DM II : diagnosed at work back in 2012 she used to take medications  She denies polyphagia, polyuria , she has polydipsia.  Started on Trulicity June 2018, but she was having worsening of constipation and indigestion, so we switched to Ozempic 04/2017 weight was 198 lbs and went  down to 132.4  lbs. She was off Ozempic since January 21 and weight went up to 160 lbs, we stopped medication due to bloating and nausea. A1C has been controlled -  today is 6 % but weight is trending up .  She is on Metformin , flatus resolved     Osteoporosis: she had first Reclast injection, due for injection in January with Dr. Gershon Crane   Chest pain: intermittently and not as frequent, it happens about once a month, she is under the care of  cardiologist. Dr. Azucena Cecil and had EKG and echo that showed normal EF . CT lung had already shown some coronary calcification. She is compliant  with statin therapy     Atherosclerosis Aorta: found on CT done 03/20/2020, on statin therapy , no side effects of medication, LDL has been at goal     Patient Active Problem List   Diagnosis Date Noted   Screening for colon cancer 05/07/2023   Adenomatous polyp of cecum 05/07/2023   CAD (coronary artery disease), native coronary artery 01/09/2023   Hiatal hernia 10/24/2022   Chronic GERD 10/24/2022   Spasms of the hands or feet 01/10/2022   Insomnia due to mental disorder 01/10/2022   Tendinitis of upper biceps tendon of left shoulder 05/22/2021   Rotator cuff tendinitis, left 05/22/2021   Nontraumatic  incomplete tear of left rotator cuff 05/22/2021   History of shingles 03/23/2020   Burning sensation of lower extremity 03/22/2020   Centrilobular emphysema (HCC) 01/12/2020   Senile purpura (HCC) 01/12/2020   Major depression in remission (HCC) 01/12/2020   Dyslipidemia associated with type 2 diabetes mellitus (HCC) 01/12/2020   Bursitis of hip 08/23/2019   Carpal tunnel syndrome 08/23/2019   Thoracic aorta atherosclerosis (HCC) 08/03/2019   Spondylosis of lumbar spine 11/06/2016   Spinal stenosis of lumbar region with neurogenic claudication 10/28/2016   Chronic bilateral low back pain with bilateral sciatica 09/16/2016   Moderate episode of recurrent major depressive disorder (HCC) 08/07/2016   Pain of left heel 06/02/2016   Degenerative cervical disc 03/04/2016   Gastroesophageal reflux disease without esophagitis 02/01/2016   Primary osteoarthritis of right hand 02/01/2016   Paresthesias in left hand 02/01/2016   History of smoking 10/05/2013   Hyperlipidemia 10/05/2013   Adult onset hypothyroidism 10/05/2013    Past Surgical History:  Procedure Laterality Date   ABDOMINAL HYSTERECTOMY     CARPAL TUNNEL RELEASE Right    COLON SURGERY     COLONOSCOPY WITH PROPOFOL N/A 05/07/2023   Procedure: COLONOSCOPY WITH PROPOFOL;  Surgeon: Toney Reil, MD;  Location: [facility] ENDOSCOPY;  Service: Gastroenterology;  Laterality: N/A;   ESOPHAGOGASTRODUODENOSCOPY (EGD) WITH PROPOFOL N/A 10/24/2022   Procedure: ESOPHAGOGASTRODUODENOSCOPY (EGD) WITH PROPOFOL;  Surgeon: Toney Reil, MD;  Location: [facility] ENDOSCOPY;  Service: Gastroenterology;  Laterality: N/A;   FRACTURE SURGERY     HEMOSTASIS CLIP PLACEMENT  05/07/2023   Procedure: HEMOSTASIS CLIP PLACEMENT;  Surgeon: Toney Reil, MD;  Location: [facility] ENDOSCOPY;  Service: Gastroenterology;;   LUMBAR LAMINECTOMY  03/16/2018   L4-5 Laminectomy & Fusion w/ Pedicle Screws, TLIF & Allograft; Surgeon: Virl Diamond, MD;  Location: [facility] MAIN OR; Service: Orthopedics; Laterality: N/A; Prone, ProAxis, Stim Neuromonitoring, O-Arm, Cell Saver, [employer], [employer], Justin, PACS on [facility]     OOPHORECTOMY Bilateral    ORIF TIBIA PLATEAU Left 03/10/2016   Procedure: OPEN REDUCTION INTERNAL FIXATION (ORIF) BICONDYLAR TIBIAL PLATEAU FRACTURE;  Surgeon: Eldred Manges, MD;  Location: [facility] OR;  Service: Orthopedics;  Laterality: Left;   POLYPECTOMY  05/07/2023   Procedure: POLYPECTOMY;  Surgeon: Toney Reil, MD;  Location: [facility] ENDOSCOPY;  Service: Gastroenterology;;   SHOULDER ARTHROSCOPY W/ ROTATOR CUFF REPAIR Right    SHOULDER ARTHROSCOPY WITH SUBACROMIAL DECOMPRESSION, ROTATOR CUFF REPAIR AND BICEP TENDON REPAIR Left 05/21/2021   Procedure: SHOULDER ARTHROSCOPY WITH DEBRIDEMENT, DECOMPRESSION, REPAIR OF PARTIAL THICKNESS ROTATOR CUFF REPAIR, BICEPS TENODESIS;  Surgeon: Christena Flake, MD;  Location: [facility] ORS;  Service: Orthopedics;  Laterality: Left;   SUBMUCOSAL LIFTING INJECTION  05/07/2023   Procedure: SUBMUCOSAL LIFTING INJECTION;  Surgeon: Toney Reil, MD;  Location: [facility] ENDOSCOPY;  Service: Gastroenterology;;   TONSILLECTOMY AND ADENOIDECTOMY  Family History  Problem Relation Age of Onset   Diabetes Father    Heart disease Father    Depression Sister    Alcohol abuse Sister    Anxiety disorder Sister    Bipolar disorder Sister    Pneumonia Sister    Breast cancer Maternal Aunt 7   Breast cancer Paternal Aunt 47   Hypertension Daughter    Breast cancer Daughter    Skin cancer Daughter    Heart attack Daughter    Anxiety disorder Daughter     Social History   Tobacco Use   Smoking status: Former    Current packs/day: 0.00    Average packs/day: 2.0 packs/day for 30.0 years (60.0 ttl pk-yrs)    Types: Cigarettes    Start date: 09/03/1978    Quit date: 09/03/2008    Years since quitting: 14.8   Smokeless tobacco: Never  Substance Use Topics   Alcohol use: No     Current Outpatient  Medications:    albuterol (VENTOLIN HFA) 108 (90 Base) MCG/ACT inhaler, INHALE 2 PUFFS INTO LUNGS EVERY 6 HOURS AS NEEDED FOR WHEEZING OR SHORTNESS OF BREATH, Disp: 6.7 each, Rfl: 0   Blood Glucose Monitoring Suppl DEVI, 1 each by Does not apply route in the morning, at noon, and at bedtime. May substitute to any manufacturer covered by patient's insurance., Disp: 1 each, Rfl: 0   calcium carbonate (OSCAL) 1500 (600 Ca) MG TABS tablet, Take 1,500 mg by mouth 2 (two) times daily with a meal., Disp: , Rfl:    carbidopa-levodopa (SINEMET IR) 25-100 MG tablet, Take 1 tablet by mouth 3 (three) times daily., Disp: , Rfl:    cariprazine (VRAYLAR) 1.5 MG capsule, Take 1 capsule (1.5 mg total) by mouth daily. In place of ability, Disp: 90 capsule, Rfl: 0   Cholecalciferol (VITAMIN D PO), Take 1,000 Units by mouth daily. 1000 IU, Disp: , Rfl:    clobetasol ointment (TEMOVATE) 0.05 %, Apply 1 Application topically 2 (two) times daily., Disp: 60 g, Rfl: 0   cyanocobalamin (VITAMIN B12) 1000 MCG tablet, Take 1,000 mcg by mouth daily., Disp: , Rfl:    etodolac (LODINE) 500 MG tablet, Take 500 mg by mouth 2 (two) times daily., Disp: , Rfl:    fluticasone (FLONASE) 50 MCG/ACT nasal spray, SPRAY 2 SPRAYS INTO EACH NOSTRIL EVERY DAY, Disp: 48 mL, Rfl: 0   gabapentin (NEURONTIN) 300 MG capsule, Take 300 mg by mouth at bedtime., Disp: , Rfl:    Glucose Blood (BLOOD GLUCOSE TEST STRIPS) STRP, 1 each by In Vitro route in the morning, at noon, and at bedtime. May substitute to any manufacturer covered by patient's insurance., Disp: 300 each, Rfl: 2   Lancet Device MISC, 1 each by Does not apply route in the morning, at noon, and at bedtime. May substitute to any manufacturer covered by patient's insurance., Disp: 3 each, Rfl: 2   Lancets Misc. (ACCU-CHEK FASTCLIX LANCET) KIT, PLEASE SEE ATTACHED FOR DETAILED DIRECTIONS, Disp: , Rfl:    levothyroxine (SYNTHROID) 75 MCG tablet, Take 1 tablet (75 mcg total) by mouth daily  before breakfast., Disp: 90 tablet, Rfl: 1   linaclotide (LINZESS) 145 MCG CAPS capsule, Take 1 capsule (145 mcg total) by mouth daily before breakfast., Disp: 30 capsule, Rfl: 1   loratadine (CLARITIN) 10 MG tablet, TAKE 1 TABLET (10 MG) BY MOUTH EVERY DAY AT BEDTIME, OTC NOT COVERED, Disp: 90 tablet, Rfl: 1   Melatonin 10 MG TABS, Take 10 mg by mouth at  bedtime., Disp: , Rfl:    omeprazole (PRILOSEC) 20 MG capsule, Take 20 mg by mouth daily., Disp: , Rfl:    rizatriptan (MAXALT) 10 MG tablet, TAKE 1 TABLET (10 MG TOTAL) BY MOUTH DAILY AS NEEDED FOR MIGRAINE. NO MORE THAN 2 IN 48 HOURS, Disp: 9 tablet, Rfl: 0   SUMAtriptan (IMITREX) 50 MG tablet, TAKE 1 TAB BY MOUTH ON ONSET OF HEADACHE. MAY REPEAT IN 2 HRS IF H.A.. PERSISTS OR REOCCURS. MAX 2 TABS/24 HRS, Disp: 9 tablet, Rfl: 1   valACYclovir (VALTREX) 500 MG tablet, Take 1 tablet (500 mg total) by mouth daily., Disp: 90 tablet, Rfl: 3   buPROPion (WELLBUTRIN XL) 150 MG 24 hr tablet, TAKE 1 TABLET(150 MG) BY MOUTH DAILY, Disp: 90 tablet, Rfl: 0   DULoxetine (CYMBALTA) 60 MG capsule, Take 1 capsule (60 mg total) by mouth every morning., Disp: 90 capsule, Rfl: 0   Fluticasone-Umeclidin-Vilant (TRELEGY ELLIPTA) 100-62.5-25 MCG/ACT AEPB, Inhale 1 puff into the lungs daily., Disp: 180 each, Rfl: 1   montelukast (SINGULAIR) 10 MG tablet, Take 1 tablet (10 mg total) by mouth at bedtime., Disp: 90 tablet, Rfl: 1   rosuvastatin (CRESTOR) 20 MG tablet, TAKE 1 TABLET(20 MG) BY MOUTH DAILY, Disp: 90 tablet, Rfl: 1   traZODone (DESYREL) 100 MG tablet, TAKE 1 TABLET(100 MG) BY MOUTH AT BEDTIME, Disp: 90 tablet, Rfl: 1  No Known Allergies  I personally reviewed active problem list, medication list, allergies, family history with the patient/caregiver today.   ROS  Ten systems reviewed and is negative except as mentioned in HPI    Objective  Vitals:   07/16/23 1014  BP: 96/64  Pulse: 95  Resp: 16  Temp: 97.7 F (36.5 C)  TempSrc: Oral  Weight:  160 lb 14.4 oz (73 kg)  Height: 4\' 9"  (1.448 m)    Body mass index is 34.82 kg/m.  Physical Exam  Constitutional: Patient appears well-developed and well-nourished. Obese  No distress.  HEENT: head atraumatic, normocephalic, pupils equal and reactive to light, neck supple Cardiovascular: Normal rate, regular rhythm and normal heart sounds.  No murmur heard. No BLE edema. Pulmonary/Chest: Effort normal and breath sounds normal. No respiratory distress. Abdominal: Soft.  There is no tenderness. Psychiatric: Patient has a normal mood and affect. behavior is normal. Judgment and thought content normal.   Recent Results (from the past 2160 hours)  Surgical pathology     Status: None   Collection Time: 05/07/23 12:00 AM  Result Value Ref Range   SURGICAL PATHOLOGY      SURGICAL PATHOLOGY [facility] [address], [address] [place], [place] [address] Telephone [phone] or [phone] Fax [phone]  REPORT OF SURGICAL PATHOLOGY   Accession #: [id number] Patient Name: JENNEE, NAPPA Visit # : [id number]  MRN: [id number] Physician: Lannette Donath DOB/Age 12-Apr-1956 (Age: 1) Gender: F Collected Date: 05/07/2023 Received Date: 05/07/2023  FINAL DIAGNOSIS       1. Cecum Polyp, x2 hot snare :       - TUBULAR ADENOMA (MULTIPLE FRAGMENTS)      - NEGATIVE FOR HIGH-GRADE DYSPLASIA      - SESSILE SERRATED POLYP WITHOUT CYTOLOGIC DYSPLASIA (MULTIPLE FRAGMENTS)      - NEGATIVE FOR MALIGNANCY       DATE SIGNED OUT: 05/08/2023 ELECTRONIC SIGNATURE : Corey Harold M.D., Dossie Arbour., Pathologist, Electronic Signature  MICROSCOPIC DESCRIPTION  CASE COMMENTS STAINS USED IN DIAGNOSIS: H&E    CLINICAL HISTORY  SPECIMEN(S) OBTAINED 1. Cecum Polyp, X2 Hot  Snare  SPECIMEN COMMENTS: SPECIMEN CLINICAL  INFORMATION: 1. Diverticulosis, colon polyp, hemorrhoids    Gross Description 1. Received in formalin, labeled "hot snare cecal colon polyp", is a  1.2 x 0.8 x 0.2 cm aggregate of multiple soft, tan tissue fragments submitted entirely in block 1A.      SMB      05/07/2023        Report signed out from the following location(s) [place] [address], [place] [address] CLIA #: [id number]  [facility] [address] [place], [place] [address] CLIA #: [id number]   Glucose, capillary     Status: None   Collection Time: 05/07/23  8:22 AM  Result Value Ref Range   Glucose-Capillary 85 70 - 99 mg/dL    Comment: Glucose reference range applies only to samples taken after fasting for at least 8 hours.  POCT HgB A1C     Status: Abnormal   Collection Time: 07/16/23 10:20 AM  Result Value Ref Range   Hemoglobin A1C 6.0 (A) 4.0 - 5.6 %   HbA1c POC (<> result, manual entry)     HbA1c, POC (prediabetic range)     HbA1c, POC (controlled diabetic range)      Diabetic Foot Exam: Diabetic Foot Exam - Simple   Simple Foot Form Visual Inspection No deformities, no ulcerations, no other skin breakdown bilaterally: Yes Sensation Testing Intact to touch and monofilament testing bilaterally: Yes Pulse Check Posterior Tibialis and Dorsalis pulse intact bilaterally: Yes Comments      PHQ2/9:    07/16/2023   10:13 AM 03/16/2023   10:12 AM 11/13/2022    8:25 AM 09/17/2022    9:50 AM 09/08/2022    8:59 AM  Depression screen PHQ 2/9  Decreased Interest 1 2 2  0 0  Down, Depressed, Hopeless 1 0 1 0 0  PHQ - 2 Score 2 2 3  0 0  Altered sleeping 1 3 0 1 3  Tired, decreased energy 1 2 0 0 0  Change in appetite 0 3 0 0 0  Feeling bad or failure about yourself  0 2 0 0 0  Trouble concentrating 1 0 0 0 0  Moving slowly or fidgety/restless 0 0 0 0 0  Suicidal thoughts 0 0 0 0 0  PHQ-9 Score 5 12 3 1 3   Difficult doing work/chores Somewhat difficult Somewhat difficult  Not difficult at all     phq 9 is positive   Fall Risk:    07/16/2023   10:13 AM 03/16/2023   10:12 AM 11/13/2022     8:24 AM 09/17/2022    9:49 AM 09/08/2022    8:51 AM  Fall Risk   Falls in the past year? 0 1 0 0 0  Number falls in past yr: 0 1 0 0 0  Injury with Fall? 0 0 0 0 0  Risk for fall due to : No Fall Risks  No Fall Risks No Fall Risks No Fall Risks  Follow up Falls prevention discussed;Education provided;Falls evaluation completed  Falls prevention discussed Falls prevention discussed;Education provided;Falls evaluation completed Falls prevention discussed     Assessment & Plan  1. Dyslipidemia associated with type 2 diabetes mellitus (HCC) (Primary)  - Ambulatory referral to Ophthalmology - POCT HgB A1C - Lipid panel - Microalbumin / creatinine urine ratio - COMPLETE METABOLIC PANEL WITH GFR  2. Thoracic aorta atherosclerosis (HCC)  - rosuvastatin (CRESTOR) 20 MG tablet; TAKE 1 TABLET(20 MG) BY  MOUTH DAILY  Dispense: 90 tablet; Refill: 1  3. Senile purpura (HCC)  Reassurance given   4. MDD (major depressive disorder), recurrent episode, moderate (HCC)  Still depressed, cannot see psychiatrist, willing to try switching from Abiligy to Vraylar  - DULoxetine (CYMBALTA) 60 MG capsule; Take 1 capsule (60 mg total) by mouth every morning.  Dispense: 90 capsule; Refill: 0 - cariprazine (VRAYLAR) 1.5 MG capsule; Take 1 capsule (1.5 mg total) by mouth daily. In place of ability  Dispense: 90 capsule; Refill: 0  5. Asthma-COPD overlap syndrome (HCC)  - Fluticasone-Umeclidin-Vilant (TRELEGY ELLIPTA) 100-62.5-25 MCG/ACT AEPB; Inhale 1 puff into the lungs daily.  Dispense: 180 each; Refill: 1 - montelukast (SINGULAIR) 10 MG tablet; Take 1 tablet (10 mg total) by mouth at bedtime.  Dispense: 90 tablet; Refill: 1  6. Parkinson's disease without dyskinesia or fluctuating manifestations (HCC)  Under the care of Dr. Sherryll Burger  7. Need for influenza vaccination  - Flu Vaccine Trivalent High Dose (Fluad)  8. Migraine without aura and without status migrainosus, not intractable   9. Adult onset  hypothyroidism  - TSH - buPROPion (WELLBUTRIN XL) 150 MG 24 hr tablet; TAKE 1 TABLET(150 MG) BY MOUTH DAILY  Dispense: 90 tablet; Refill: 0  10. Age-related osteoporosis without current pathological fracture  Needs to see Dr. Gershon Crane  11. B12 deficiency  - CBC with Differential/Platelet - B12 and Folate Panel  12. Insomnia due to mental disorder  - traZODone (DESYREL) 100 MG tablet; TAKE 1 TABLET(100 MG) BY MOUTH AT BEDTIME  Dispense: 90 tablet; Refill: 1

## 2023-07-06 NOTE — Telephone Encounter (Signed)
Pt verbalized unsure which one she is taking but she does not need any refills, she has not been taking any of them. Pt stated has an upcoming appt with PCP will bring all her meds to go over which ones she is taking

## 2023-07-15 ENCOUNTER — Other Ambulatory Visit: Payer: Self-pay | Admitting: Family Medicine

## 2023-07-15 DIAGNOSIS — J432 Centrilobular emphysema: Secondary | ICD-10-CM

## 2023-07-16 ENCOUNTER — Encounter: Payer: Self-pay | Admitting: Family Medicine

## 2023-07-16 ENCOUNTER — Ambulatory Visit (INDEPENDENT_AMBULATORY_CARE_PROVIDER_SITE_OTHER): Payer: Medicare HMO | Admitting: Family Medicine

## 2023-07-16 VITALS — BP 96/64 | HR 95 | Temp 97.7°F | Resp 16 | Ht <= 58 in | Wt 160.9 lb

## 2023-07-16 DIAGNOSIS — I700 Atherosclerosis of aorta: Secondary | ICD-10-CM | POA: Diagnosis not present

## 2023-07-16 DIAGNOSIS — F5105 Insomnia due to other mental disorder: Secondary | ICD-10-CM

## 2023-07-16 DIAGNOSIS — E038 Other specified hypothyroidism: Secondary | ICD-10-CM

## 2023-07-16 DIAGNOSIS — G20A1 Parkinson's disease without dyskinesia, without mention of fluctuations: Secondary | ICD-10-CM | POA: Diagnosis not present

## 2023-07-16 DIAGNOSIS — G43009 Migraine without aura, not intractable, without status migrainosus: Secondary | ICD-10-CM

## 2023-07-16 DIAGNOSIS — E1169 Type 2 diabetes mellitus with other specified complication: Secondary | ICD-10-CM | POA: Diagnosis not present

## 2023-07-16 DIAGNOSIS — E538 Deficiency of other specified B group vitamins: Secondary | ICD-10-CM

## 2023-07-16 DIAGNOSIS — J4489 Other specified chronic obstructive pulmonary disease: Secondary | ICD-10-CM

## 2023-07-16 DIAGNOSIS — Z23 Encounter for immunization: Secondary | ICD-10-CM

## 2023-07-16 DIAGNOSIS — M810 Age-related osteoporosis without current pathological fracture: Secondary | ICD-10-CM

## 2023-07-16 DIAGNOSIS — F331 Major depressive disorder, recurrent, moderate: Secondary | ICD-10-CM | POA: Diagnosis not present

## 2023-07-16 DIAGNOSIS — D692 Other nonthrombocytopenic purpura: Secondary | ICD-10-CM

## 2023-07-16 LAB — POCT GLYCOSYLATED HEMOGLOBIN (HGB A1C): Hemoglobin A1C: 6 % — AB (ref 4.0–5.6)

## 2023-07-16 MED ORDER — ROSUVASTATIN CALCIUM 20 MG PO TABS
ORAL_TABLET | ORAL | 1 refills | Status: DC
Start: 2023-07-16 — End: 2023-11-17

## 2023-07-16 MED ORDER — MONTELUKAST SODIUM 10 MG PO TABS
10.0000 mg | ORAL_TABLET | Freq: Every day | ORAL | 1 refills | Status: DC
Start: 2023-07-16 — End: 2024-02-22

## 2023-07-16 MED ORDER — TRELEGY ELLIPTA 100-62.5-25 MCG/ACT IN AEPB
1.0000 | INHALATION_SPRAY | Freq: Every day | RESPIRATORY_TRACT | 1 refills | Status: DC
Start: 2023-07-16 — End: 2023-11-17

## 2023-07-16 MED ORDER — TRAZODONE HCL 100 MG PO TABS
ORAL_TABLET | ORAL | 1 refills | Status: DC
Start: 2023-07-16 — End: 2024-03-22

## 2023-07-16 MED ORDER — CARIPRAZINE HCL 1.5 MG PO CAPS
1.5000 mg | ORAL_CAPSULE | Freq: Every day | ORAL | 0 refills | Status: DC
Start: 2023-07-16 — End: 2023-10-26

## 2023-07-16 MED ORDER — DULOXETINE HCL 60 MG PO CPEP
60.0000 mg | ORAL_CAPSULE | Freq: Every morning | ORAL | 0 refills | Status: DC
Start: 2023-07-16 — End: 2023-11-17

## 2023-07-16 MED ORDER — BUPROPION HCL ER (XL) 150 MG PO TB24: ORAL_TABLET | ORAL | 0 refills | Status: DC

## 2023-07-20 DIAGNOSIS — E119 Type 2 diabetes mellitus without complications: Secondary | ICD-10-CM | POA: Diagnosis not present

## 2023-07-20 DIAGNOSIS — H269 Unspecified cataract: Secondary | ICD-10-CM | POA: Diagnosis not present

## 2023-07-20 LAB — HM DIABETES EYE EXAM

## 2023-07-24 DIAGNOSIS — Z8781 Personal history of (healed) traumatic fracture: Secondary | ICD-10-CM | POA: Diagnosis not present

## 2023-07-24 DIAGNOSIS — M1732 Unilateral post-traumatic osteoarthritis, left knee: Secondary | ICD-10-CM | POA: Diagnosis not present

## 2023-07-24 DIAGNOSIS — S82142D Displaced bicondylar fracture of left tibia, subsequent encounter for closed fracture with routine healing: Secondary | ICD-10-CM | POA: Diagnosis not present

## 2023-07-24 DIAGNOSIS — Z9889 Other specified postprocedural states: Secondary | ICD-10-CM | POA: Diagnosis not present

## 2023-07-27 DIAGNOSIS — Z9889 Other specified postprocedural states: Secondary | ICD-10-CM | POA: Diagnosis not present

## 2023-07-27 DIAGNOSIS — Z8781 Personal history of (healed) traumatic fracture: Secondary | ICD-10-CM | POA: Diagnosis not present

## 2023-07-27 DIAGNOSIS — M1732 Unilateral post-traumatic osteoarthritis, left knee: Secondary | ICD-10-CM | POA: Diagnosis not present

## 2023-07-27 DIAGNOSIS — S82142D Displaced bicondylar fracture of left tibia, subsequent encounter for closed fracture with routine healing: Secondary | ICD-10-CM | POA: Diagnosis not present

## 2023-07-30 ENCOUNTER — Other Ambulatory Visit: Payer: Self-pay | Admitting: Family Medicine

## 2023-07-30 DIAGNOSIS — E1169 Type 2 diabetes mellitus with other specified complication: Secondary | ICD-10-CM

## 2023-08-15 LAB — B12 AND FOLATE PANEL
Folate: 20 ng/mL
Vitamin B-12: 890 pg/mL (ref 200–1100)

## 2023-08-15 LAB — TSH: TSH: 2.71 m[IU]/L (ref 0.40–4.50)

## 2023-08-15 LAB — COMPLETE METABOLIC PANEL WITH GFR
AG Ratio: 1.7 (calc) (ref 1.0–2.5)
ALT: 10 U/L (ref 6–29)
AST: 16 U/L (ref 10–35)
Albumin: 3.7 g/dL (ref 3.6–5.1)
Alkaline phosphatase (APISO): 70 U/L (ref 37–153)
BUN: 13 mg/dL (ref 7–25)
CO2: 30 mmol/L (ref 20–32)
Calcium: 9.7 mg/dL (ref 8.6–10.4)
Chloride: 104 mmol/L (ref 98–110)
Creat: 1 mg/dL (ref 0.50–1.05)
Globulin: 2.2 g/dL (ref 1.9–3.7)
Glucose, Bld: 88 mg/dL (ref 65–99)
Potassium: 4 mmol/L (ref 3.5–5.3)
Sodium: 142 mmol/L (ref 135–146)
Total Bilirubin: 0.6 mg/dL (ref 0.2–1.2)
Total Protein: 5.9 g/dL — ABNORMAL LOW (ref 6.1–8.1)
eGFR: 61 mL/min/{1.73_m2} (ref >=60)

## 2023-08-15 LAB — CBC WITH DIFFERENTIAL/PLATELET
Absolute Lymphocytes: 1920 {cells}/uL (ref 850–3900)
Absolute Monocytes: 371 {cells}/uL (ref 200–950)
Basophils Absolute: 32 {cells}/uL (ref 0–200)
Basophils Relative: 0.5 %
Eosinophils Absolute: 147 {cells}/uL (ref 15–500)
Eosinophils Relative: 2.3 %
HCT: 40.7 % (ref 35.0–45.0)
Hemoglobin: 13.3 g/dL (ref 11.7–15.5)
MCH: 30.9 pg (ref 27.0–33.0)
MCHC: 32.7 g/dL (ref 32.0–36.0)
MCV: 94.4 fL (ref 80.0–100.0)
MPV: 9.5 fL (ref 7.5–12.5)
Monocytes Relative: 5.8 %
Neutro Abs: 3930 {cells}/uL (ref 1500–7800)
Neutrophils Relative %: 61.4 %
Platelets: 228 10*3/uL (ref 140–400)
RBC: 4.31 10*6/uL (ref 3.80–5.10)
RDW: 12 % (ref 11.0–15.0)
Total Lymphocyte: 30 %
WBC: 6.4 10*3/uL (ref 3.8–10.8)

## 2023-08-15 LAB — LIPID PANEL
Cholesterol: 134 mg/dL (ref <200)
HDL: 76 mg/dL (ref >=50)
LDL Cholesterol (Calc): 44 mg/dL
Non-HDL Cholesterol (Calc): 58 mg/dL (ref <130)
Total CHOL/HDL Ratio: 1.8 (calc) (ref <5.0)
Triglycerides: 63 mg/dL (ref <150)

## 2023-08-15 LAB — MICROALBUMIN / CREATININE URINE RATIO
Creatinine, Urine: 28 mg/dL (ref 20–275)
Microalb, Ur: <0.2 mg/dL

## 2023-08-17 ENCOUNTER — Encounter: Payer: Self-pay | Admitting: Family Medicine

## 2023-08-17 ENCOUNTER — Other Ambulatory Visit: Payer: Self-pay | Admitting: Family Medicine

## 2023-08-17 DIAGNOSIS — J432 Centrilobular emphysema: Secondary | ICD-10-CM

## 2023-08-18 DIAGNOSIS — Z008 Encounter for other general examination: Secondary | ICD-10-CM | POA: Diagnosis not present

## 2023-08-18 LAB — HEMOGLOBIN A1C: Hemoglobin A1C: 5.6

## 2023-08-18 LAB — MICROALBUMIN / CREATININE URINE RATIO: Microalb Creat Ratio: <30

## 2023-09-03 DIAGNOSIS — Z0100 Encounter for examination of eyes and vision without abnormal findings: Secondary | ICD-10-CM | POA: Diagnosis not present

## 2023-09-06 ENCOUNTER — Other Ambulatory Visit: Payer: Self-pay | Admitting: Family Medicine

## 2023-09-06 DIAGNOSIS — F331 Major depressive disorder, recurrent, moderate: Secondary | ICD-10-CM

## 2023-09-07 NOTE — Telephone Encounter (Signed)
 Requested medication (s) are due for refill today - no  Requested medication (s) are on the active medication list -no  Future visit scheduled -yes  Last refill: no longer listed on current medication list  Notes to clinic: non delegated Rx  Requested Prescriptions  Pending Prescriptions Disp Refills   ARIPiprazole  (ABILIFY ) 2 MG tablet [Pharmacy Med Name: ARIPIPRAZOLE  2 MG TABLET] 90 tablet 0    Sig: TAKE 1 TABLET BY MOUTH EVERY DAY     Not Delegated - Psychiatry:  Antipsychotics - Second Generation (Atypical) - aripiprazole  Failed - 09/07/2023  3:15 PM      Failed - This refill cannot be delegated      Failed - Lipid Panel in normal range within the last 12 months    Cholesterol, Total  Date Value Ref Range Status  03/11/2019 105 100 - 199 mg/dL Final   Cholesterol  Date Value Ref Range Status  08/14/2023 134 <200 mg/dL Final   LDL Cholesterol (Calc)  Date Value Ref Range Status  08/14/2023 44 mg/dL (calc) Final    Comment:    Reference range: <100 . Desirable range <100 mg/dL for primary prevention;   <70 mg/dL for patients with CHD or diabetic patients  with > or = 2 CHD risk factors. Aaron Aas LDL-C is now calculated using the Martin-Hopkins  calculation, which is a validated novel method providing  better accuracy than the Friedewald equation in the  estimation of LDL-C.  Melinda Sprawls et al. Erroll Heard. 1610;960(45): 2061-2068  (http://education.[facility].com/faq/FAQ164)    HDL  Date Value Ref Range Status  08/14/2023 76 > OR = 50 mg/dL Final  40/98/1191 60 >47 mg/dL Final   Triglycerides  Date Value Ref Range Status  08/14/2023 63 <150 mg/dL Final         Failed - CMP within normal limits and completed in the last 12 months    Albumin  Date Value Ref Range Status  03/20/2020 4.2 3.5 - 5.0 g/dL Final  82/95/6213 3.8 3.8 - 4.8 g/dL Final   Alkaline Phosphatase  Date Value Ref Range Status  03/20/2020 75 38 - 126 U/L Final   Alkaline phosphatase (APISO)   Date Value Ref Range Status  08/14/2023 70 37 - 153 U/L Final   ALT  Date Value Ref Range Status  08/14/2023 10 6 - 29 U/L Final   AST  Date Value Ref Range Status  08/14/2023 16 10 - 35 U/L Final   BUN  Date Value Ref Range Status  08/14/2023 13 7 - 25 mg/dL Final  08/65/7846 10 8 - 27 mg/dL Final   Calcium   Date Value Ref Range Status  08/14/2023 9.7 8.6 - 10.4 mg/dL Final   CO2  Date Value Ref Range Status  08/14/2023 30 20 - 32 mmol/L Final   Creat  Date Value Ref Range Status  08/14/2023 1.00 0.50 - 1.05 mg/dL Final   Creatinine, Urine  Date Value Ref Range Status  08/14/2023 28 20 - 275 mg/dL Final   Glucose  Date Value Ref Range Status  01/21/2017 109  Final   Glucose, Bld  Date Value Ref Range Status  08/14/2023 88 65 - 99 mg/dL Final    Comment:    .            Fasting reference interval .    Glucose-Capillary  Date Value Ref Range Status  05/07/2023 85 70 - 99 mg/dL Final    Comment:    Glucose reference range applies only to samples taken  after fasting for at least 8 hours.   Potassium  Date Value Ref Range Status  08/14/2023 4.0 3.5 - 5.3 mmol/L Final   Sodium  Date Value Ref Range Status  08/14/2023 142 135 - 146 mmol/L Final  08/30/2019 143 134 - 144 mmol/L Final   Total Bilirubin  Date Value Ref Range Status  08/14/2023 0.6 0.2 - 1.2 mg/dL Final   Bilirubin Total  Date Value Ref Range Status  05/11/2019 0.3 0.0 - 1.2 mg/dL Final   Bilirubin, Direct  Date Value Ref Range Status  05/11/2019 0.14 0.00 - 0.40 mg/dL Final   Protein, ur  Date Value Ref Range Status  03/20/2020 NEGATIVE NEGATIVE mg/dL Final   Total Protein  Date Value Ref Range Status  08/14/2023 5.9 (L) 6.1 - 8.1 g/dL Final  81/19/1478 5.4 (L) 6.0 - 8.5 g/dL Final   GFR, Est African American  Date Value Ref Range Status  09/17/2020 92 > OR = 60 mL/min/1.70m2 Final   eGFR  Date Value Ref Range Status  08/14/2023 61 > OR = 60 mL/min/1.23m2 Final   GFR,  Est Non African American  Date Value Ref Range Status  09/17/2020 79 > OR = 60 mL/min/1.74m2 Final   GFR, Estimated  Date Value Ref Range Status  05/20/2021 >60 >60 mL/min Final    Comment:    (NOTE) Calculated using the CKD-EPI Creatinine Equation (2021)          Passed - TSH in normal range and within 360 days    TSH  Date Value Ref Range Status  08/14/2023 2.71 0.40 - 4.50 mIU/L Final         Passed - Completed PHQ-2 or PHQ-9 in the last 360 days      Passed - Last BP in normal range    BP Readings from Last 1 Encounters:  07/16/23 96/64         Passed - Last Heart Rate in normal range    Pulse Readings from Last 1 Encounters:  07/16/23 95         Passed - Valid encounter within last 6 months    Recent Outpatient Visits           1 month ago Dyslipidemia associated with type 2 diabetes mellitus [facility])   [place] [facility] [place], Arvis Laura, MD   5 months ago Dyslipidemia associated with type 2 diabetes mellitus [facility])   [place] [facility] Arleen Lacer, MD   9 months ago MDD (major depressive disorder), recurrent episode, moderate (HCC)   [place] Arleen Lacer, MD   11 months ago COVID-19   [facility] Mecum, [place], [place]   12 months ago Coarse tremors   [place] [facility] Arleen Lacer, MD       Future Appointments             In 2 months Sowles, Krichna, MD [facility]            Passed - CBC within normal limits and completed in the last 12 months    WBC  Date Value Ref Range Status  08/14/2023 6.4 3.8 - 10.8 Thousand/uL Final   RBC  Date Value Ref Range Status  08/14/2023 4.31 3.80 - 5.10 Million/uL Final   Hemoglobin  Date Value Ref Range Status  08/14/2023 13.3 11.7 - 15.5 g/dL Final  29/56/2130 86.5 11.1 - 15.9 g/dL Final   HCT  Date Value Ref Range Status  08/14/2023 40.7 35.0 - 45.0  % Final   Hematocrit  Date Value Ref Range Status  03/11/2019 39.8 34.0 - 46.6 % Final   MCHC  Date Value Ref Range Status  08/14/2023 32.7 32.0 - 36.0 g/dL Final    Comment:    For adults, a slight decrease in the calculated MCHC value (in the range of 30 to 32 g/dL) is most likely not clinically significant; however, it should be interpreted with caution in correlation with other red cell parameters and the patient's clinical condition.    [facility]  Date Value Ref Range Status  08/14/2023 30.9 27.0 - 33.0 pg Final   MCV  Date Value Ref Range Status  08/14/2023 94.4 80.0 - 100.0 fL Final  03/11/2019 89 79 - 97 fL Final   No results found for: "PLTCOUNTKUC", "LABPLAT", "POCPLA" RDW  Date Value Ref Range Status  08/14/2023 12.0 11.0 - 15.0 % Final  03/11/2019 12.0 11.7 - 15.4 % Final            Requested Prescriptions  Pending Prescriptions Disp Refills   ARIPiprazole  (ABILIFY ) 2 MG tablet [Pharmacy Med Name: ARIPIPRAZOLE  2 MG TABLET] 90 tablet 0    Sig: TAKE 1 TABLET BY MOUTH EVERY DAY     Not Delegated - Psychiatry:  Antipsychotics - Second Generation (Atypical) - aripiprazole  Failed - 09/07/2023  3:15 PM      Failed - This refill cannot be delegated      Failed - Lipid Panel in normal range within the last 12 months    Cholesterol, Total  Date Value Ref Range Status  03/11/2019 105 100 - 199 mg/dL Final   Cholesterol  Date Value Ref Range Status  08/14/2023 134 <200 mg/dL Final   LDL Cholesterol (Calc)  Date Value Ref Range Status  08/14/2023 44 mg/dL (calc) Final    Comment:    Reference range: <100 . Desirable range <100 mg/dL for primary prevention;   <70 mg/dL for patients with CHD or diabetic patients  with > or = 2 CHD risk factors. Aaron Aas LDL-C is now calculated using the Martin-Hopkins  calculation, which is a validated novel method providing  better accuracy than the Friedewald equation in the  estimation of LDL-C.  Melinda Sprawls et al. Erroll Heard.  1610;960(45): 2061-2068  (http://education.[facility].com/faq/FAQ164)    HDL  Date Value Ref Range Status  08/14/2023 76 > OR = 50 mg/dL Final  40/98/1191 60 >47 mg/dL Final   Triglycerides  Date Value Ref Range Status  08/14/2023 63 <150 mg/dL Final         Failed - CMP within normal limits and completed in the last 12 months    Albumin  Date Value Ref Range Status  03/20/2020 4.2 3.5 - 5.0 g/dL Final  82/95/6213 3.8 3.8 - 4.8 g/dL Final   Alkaline Phosphatase  Date Value Ref Range Status  03/20/2020 75 38 - 126 U/L Final   Alkaline phosphatase (APISO)  Date Value Ref Range Status  08/14/2023 70 37 - 153 U/L Final   ALT  Date Value Ref Range Status  08/14/2023 10 6 - 29 U/L Final   AST  Date Value Ref Range Status  08/14/2023 16 10 - 35 U/L Final   BUN  Date Value Ref Range Status  08/14/2023 13 7 - 25 mg/dL Final  08/65/7846 10 8 - 27 mg/dL Final   Calcium   Date Value Ref Range Status  08/14/2023 9.7 8.6 - 10.4 mg/dL Final  CO2  Date Value Ref Range Status  08/14/2023 30 20 - 32 mmol/L Final   Creat  Date Value Ref Range Status  08/14/2023 1.00 0.50 - 1.05 mg/dL Final   Creatinine, Urine  Date Value Ref Range Status  08/14/2023 28 20 - 275 mg/dL Final   Glucose  Date Value Ref Range Status  01/21/2017 109  Final   Glucose, Bld  Date Value Ref Range Status  08/14/2023 88 65 - 99 mg/dL Final    Comment:    .            Fasting reference interval .    Glucose-Capillary  Date Value Ref Range Status  05/07/2023 85 70 - 99 mg/dL Final    Comment:    Glucose reference range applies only to samples taken after fasting for at least 8 hours.   Potassium  Date Value Ref Range Status  08/14/2023 4.0 3.5 - 5.3 mmol/L Final   Sodium  Date Value Ref Range Status  08/14/2023 142 135 - 146 mmol/L Final  08/30/2019 143 134 - 144 mmol/L Final   Total Bilirubin  Date Value Ref Range Status  08/14/2023 0.6 0.2 - 1.2 mg/dL Final   Bilirubin  Total  Date Value Ref Range Status  05/11/2019 0.3 0.0 - 1.2 mg/dL Final   Bilirubin, Direct  Date Value Ref Range Status  05/11/2019 0.14 0.00 - 0.40 mg/dL Final   Protein, ur  Date Value Ref Range Status  03/20/2020 NEGATIVE NEGATIVE mg/dL Final   Total Protein  Date Value Ref Range Status  08/14/2023 5.9 (L) 6.1 - 8.1 g/dL Final  60/45/4098 5.4 (L) 6.0 - 8.5 g/dL Final   GFR, Est African American  Date Value Ref Range Status  09/17/2020 92 > OR = 60 mL/min/1.22m2 Final   eGFR  Date Value Ref Range Status  08/14/2023 61 > OR = 60 mL/min/1.35m2 Final   GFR, Est Non African American  Date Value Ref Range Status  09/17/2020 79 > OR = 60 mL/min/1.27m2 Final   GFR, Estimated  Date Value Ref Range Status  05/20/2021 >60 >60 mL/min Final    Comment:    (NOTE) Calculated using the CKD-EPI Creatinine Equation (2021)          Passed - TSH in normal range and within 360 days    TSH  Date Value Ref Range Status  08/14/2023 2.71 0.40 - 4.50 mIU/L Final         Passed - Completed PHQ-2 or PHQ-9 in the last 360 days      Passed - Last BP in normal range    BP Readings from Last 1 Encounters:  07/16/23 96/64         Passed - Last Heart Rate in normal range    Pulse Readings from Last 1 Encounters:  07/16/23 95         Passed - Valid encounter within last 6 months    Recent Outpatient Visits           1 month ago Dyslipidemia associated with type 2 diabetes mellitus [facility])   [place] [facility] Arleen Lacer, MD   5 months ago Dyslipidemia associated with type 2 diabetes mellitus [facility])   [place] [facility] Arleen Lacer, [place]   9 months ago MDD (major depressive disorder), recurrent episode, moderate (HCC)   [place] [facility] Arleen Lacer, [place]   11 months ago COVID-19   [facility] Mecum, [place]  E, PA-C   12 months ago Coarse tremors   [place] [facility]  Arleen Lacer, MD       Future Appointments             In 2 months Ava Lei, Krichna, MD [facility]            Passed - CBC within normal limits and completed in the last 12 months    WBC  Date Value Ref Range Status  08/14/2023 6.4 3.8 - 10.8 Thousand/uL Final   RBC  Date Value Ref Range Status  08/14/2023 4.31 3.80 - 5.10 Million/uL Final   Hemoglobin  Date Value Ref Range Status  08/14/2023 13.3 11.7 - 15.5 g/dL Final  45/40/9811 91.4 11.1 - 15.9 g/dL Final   HCT  Date Value Ref Range Status  08/14/2023 40.7 35.0 - 45.0 % Final   Hematocrit  Date Value Ref Range Status  03/11/2019 39.8 34.0 - 46.6 % Final   MCHC  Date Value Ref Range Status  08/14/2023 32.7 32.0 - 36.0 g/dL Final    Comment:    For adults, a slight decrease in the calculated MCHC value (in the range of 30 to 32 g/dL) is most likely not clinically significant; however, it should be interpreted with caution in correlation with other red cell parameters and the patient's clinical condition.    [facility]  Date Value Ref Range Status  08/14/2023 30.9 27.0 - 33.0 pg Final   MCV  Date Value Ref Range Status  08/14/2023 94.4 80.0 - 100.0 fL Final  03/11/2019 89 79 - 97 fL Final   No results found for: "PLTCOUNTKUC", "LABPLAT", "POCPLA" RDW  Date Value Ref Range Status  08/14/2023 12.0 11.0 - 15.0 % Final  03/11/2019 12.0 11.7 - 15.4 % Final

## 2023-09-09 ENCOUNTER — Other Ambulatory Visit: Payer: Self-pay | Admitting: Family Medicine

## 2023-09-09 DIAGNOSIS — J432 Centrilobular emphysema: Secondary | ICD-10-CM

## 2023-09-20 ENCOUNTER — Other Ambulatory Visit: Payer: Self-pay | Admitting: Family Medicine

## 2023-09-20 DIAGNOSIS — F331 Major depressive disorder, recurrent, moderate: Secondary | ICD-10-CM

## 2023-10-01 DIAGNOSIS — E538 Deficiency of other specified B group vitamins: Secondary | ICD-10-CM | POA: Diagnosis not present

## 2023-10-01 DIAGNOSIS — G4733 Obstructive sleep apnea (adult) (pediatric): Secondary | ICD-10-CM | POA: Diagnosis not present

## 2023-10-01 DIAGNOSIS — R251 Tremor, unspecified: Secondary | ICD-10-CM | POA: Diagnosis not present

## 2023-10-01 DIAGNOSIS — E559 Vitamin D deficiency, unspecified: Secondary | ICD-10-CM | POA: Diagnosis not present

## 2023-10-08 DIAGNOSIS — M810 Age-related osteoporosis without current pathological fracture: Secondary | ICD-10-CM | POA: Diagnosis not present

## 2023-10-10 ENCOUNTER — Other Ambulatory Visit: Payer: Self-pay | Admitting: Family Medicine

## 2023-10-10 DIAGNOSIS — R0981 Nasal congestion: Secondary | ICD-10-CM

## 2023-10-15 DIAGNOSIS — M810 Age-related osteoporosis without current pathological fracture: Secondary | ICD-10-CM | POA: Diagnosis not present

## 2023-10-25 ENCOUNTER — Other Ambulatory Visit: Payer: Self-pay | Admitting: Family Medicine

## 2023-10-25 DIAGNOSIS — F331 Major depressive disorder, recurrent, moderate: Secondary | ICD-10-CM

## 2023-10-27 ENCOUNTER — Other Ambulatory Visit: Payer: Self-pay | Admitting: Family Medicine

## 2023-10-27 DIAGNOSIS — E1169 Type 2 diabetes mellitus with other specified complication: Secondary | ICD-10-CM

## 2023-10-29 NOTE — Telephone Encounter (Signed)
 No answer from patient attempted 2 no answer, unable to leave vm

## 2023-11-01 ENCOUNTER — Other Ambulatory Visit: Payer: Self-pay | Admitting: Family Medicine

## 2023-11-01 DIAGNOSIS — G43009 Migraine without aura, not intractable, without status migrainosus: Secondary | ICD-10-CM

## 2023-11-03 ENCOUNTER — Ambulatory Visit: Payer: Medicare HMO | Attending: Cardiology | Admitting: Cardiology

## 2023-11-03 VITALS — BP 115/72 | HR 103 | Ht <= 58 in | Wt 158.8 lb

## 2023-11-03 DIAGNOSIS — E7800 Pure hypercholesterolemia, unspecified: Secondary | ICD-10-CM

## 2023-11-03 DIAGNOSIS — I2510 Atherosclerotic heart disease of native coronary artery without angina pectoris: Secondary | ICD-10-CM | POA: Diagnosis not present

## 2023-11-03 DIAGNOSIS — R051 Acute cough: Secondary | ICD-10-CM | POA: Diagnosis not present

## 2023-11-03 MED ORDER — AZITHROMYCIN 250 MG PO TABS: ORAL_TABLET | ORAL | 0 refills | Status: DC

## 2023-11-03 NOTE — Patient Instructions (Signed)
 Medication Instructions:   Your Physician recommend you continue on your current medication as directed.     *If you need a refill on your cardiac medications before your next appointment, please call your pharmacy*  Lab Work: No labs ordered today   Testing/Procedures: No test ordered today   Follow-Up: At [facility], you and your health needs are our priority.  As part of our continuing mission to provide you with exceptional heart care, our providers are all part of one team.  This team includes your primary Cardiologist (physician) and Advanced Practice Providers or APPs (Physician Assistants and Nurse Practitioners) who all work together to provide you with the care you need, when you need it.  Your next appointment:   1 year(s)  Provider:   You may see Dr. Azucena Cecil or one of the following Advanced Practice Providers on your designated Care Team:   Nicolasa Ducking, NP Ames Dura, PA-C Eula Listen, PA-C Cadence Dayton, PA-C Charlsie Quest, NP Carlos Levering, NP    We recommend signing up for the patient portal called "MyChart".  Sign up information is provided on this After Visit Summary.  MyChart is used to connect with patients for Virtual Visits (Telemedicine).  Patients are able to view lab/test results, encounter notes, upcoming appointments, etc.  Non-urgent messages can be sent to your provider as well.   To learn more about what you can do with MyChart, go to [email or web address].

## 2023-11-03 NOTE — Progress Notes (Signed)
 Cardiology Office Note:    Date:  11/03/2023   ID:  Sara Andrews, DOB 1956-02-27, MRN [id number]  PCP:  Alba Cory, MD  Cardiologist:  None  Electrophysiologist:  None   Referring MD: Alba Cory, MD   Chief Complaint  Patient presents with   Follow-up    Had some chest pain a couple of weeks ago     History of Present Illness:    Sara Andrews is a 68 y.o. female with a hx of CAD (CCTA 09/2019-Ca score 185, 50% LCx, FFRct no stenosis), diabetes, hyperlipidemia, COPD, former smoker x30 years who presents for follow-up.    Patient states having a dry cough over the past 3 weeks.  Did not sleep well last night due to a cough.  Call primary care physician, has an appointment in about 2 weeks.  States feeling tired today due to lack of sleep.  Had 1 episode of chest pain 3 weeks ago, has not had any episodes since.  Compliant with Crestor as prescribed.  Prior notes Echocardiogram 08/2019 normal systolic and diastolic function, EF 60%,  coronary CTA 09/2019 with calcium score 185, mild to moderate left circumflex stenosis, 50%, FFR CT no significant stenosis.  Past Medical History:  Diagnosis Date   Anxiety    Arthritis    "hips, hands, back" (03/10/2016)   Asthma    Chronic bronchitis (HCC)    COPD (chronic obstructive pulmonary disease) (HCC)    Cough    Depression    Esophagitis, reflux    GERD (gastroesophageal reflux disease)    Hyperlipidemia    Hypothyroidism    IBS (irritable bowel syndrome)    Lumbago    Muscle pain    Osteoarthritis    Sinus disorder    Sleep apnea    Thyroid disease    Type 2 diabetes, diet controlled (HCC)    Uncomplicated herpes simplex     Past Surgical History:  Procedure Laterality Date   ABDOMINAL HYSTERECTOMY     CARPAL TUNNEL RELEASE Right    COLON SURGERY     COLONOSCOPY WITH PROPOFOL N/A 05/07/2023   Procedure: COLONOSCOPY WITH PROPOFOL;  Surgeon: Toney Reil, MD;  Location: [facility] ENDOSCOPY;  Service:  Gastroenterology;  Laterality: N/A;   ESOPHAGOGASTRODUODENOSCOPY (EGD) WITH PROPOFOL N/A 10/24/2022   Procedure: ESOPHAGOGASTRODUODENOSCOPY (EGD) WITH PROPOFOL;  Surgeon: Toney Reil, MD;  Location: [facility] ENDOSCOPY;  Service: Gastroenterology;  Laterality: N/A;   FRACTURE SURGERY     HEMOSTASIS CLIP PLACEMENT  05/07/2023   Procedure: HEMOSTASIS CLIP PLACEMENT;  Surgeon: Toney Reil, MD;  Location: [facility] ENDOSCOPY;  Service: Gastroenterology;;   LUMBAR LAMINECTOMY  03/16/2018   L4-5 Laminectomy & Fusion w/ Pedicle Screws, TLIF & Allograft; Surgeon: Virl Diamond, MD; Location: [facility] MAIN OR; Service: Orthopedics; Laterality: N/A; Prone, ProAxis, Stim Neuromonitoring, O-Arm, Cell Saver, [employer], [employer], Justin, PACS on [facility]     OOPHORECTOMY Bilateral    ORIF TIBIA PLATEAU Left 03/10/2016   Procedure: OPEN REDUCTION INTERNAL FIXATION (ORIF) BICONDYLAR TIBIAL PLATEAU FRACTURE;  Surgeon: Eldred Manges, MD;  Location: [facility] OR;  Service: Orthopedics;  Laterality: Left;   POLYPECTOMY  05/07/2023   Procedure: POLYPECTOMY;  Surgeon: Toney Reil, MD;  Location: [facility] ENDOSCOPY;  Service: Gastroenterology;;   SHOULDER ARTHROSCOPY W/ ROTATOR CUFF REPAIR Right    SHOULDER ARTHROSCOPY WITH SUBACROMIAL DECOMPRESSION, ROTATOR CUFF REPAIR AND BICEP TENDON REPAIR Left 05/21/2021   Procedure: SHOULDER ARTHROSCOPY WITH DEBRIDEMENT, DECOMPRESSION, REPAIR OF PARTIAL THICKNESS ROTATOR CUFF REPAIR, BICEPS  TENODESIS;  Surgeon: Christena Flake, MD;  Location: [facility] ORS;  Service: Orthopedics;  Laterality: Left;   SUBMUCOSAL LIFTING INJECTION  05/07/2023   Procedure: SUBMUCOSAL LIFTING INJECTION;  Surgeon: Toney Reil, MD;  Location: [facility] ENDOSCOPY;  Service: Gastroenterology;;   TONSILLECTOMY AND ADENOIDECTOMY      Current Medications: Current Meds  Medication Sig   albuterol (VENTOLIN HFA) 108 (90 Base) MCG/ACT inhaler INHALE 2 PUFFS INTO LUNGS EVERY 6 HOURS AS NEEDED FOR WHEEZING OR  SHORTNESS OF BREATH   azithromycin (ZITHROMAX Z-PAK) 250 MG tablet Take as prescribed with food for 5 days.   Blood Glucose Monitoring Suppl DEVI 1 each by Does not apply route in the morning, at noon, and at bedtime. May substitute to any manufacturer covered by patient's insurance.   buPROPion (WELLBUTRIN XL) 150 MG 24 hr tablet TAKE 1 TABLET(150 MG) BY MOUTH DAILY   calcium carbonate (OSCAL) 1500 (600 Ca) MG TABS tablet Take 1,500 mg by mouth 2 (two) times daily with a meal.   carbidopa-levodopa (SINEMET IR) 25-100 MG tablet Take 1 tablet by mouth 3 (three) times daily.   cariprazine (VRAYLAR) 1.5 MG capsule TAKE 1 CAPSULE (1.5 MG TOTAL) BY MOUTH DAILY. IN PLACE OF ABILITY   Cholecalciferol (VITAMIN D PO) Take 1,000 Units by mouth daily. 1000 IU   clobetasol ointment (TEMOVATE) 0.05 % Apply 1 Application topically 2 (two) times daily.   cyanocobalamin (VITAMIN B12) 1000 MCG tablet Take 1,000 mcg by mouth daily.   DULoxetine (CYMBALTA) 60 MG capsule Take 1 capsule (60 mg total) by mouth every morning.   etodolac (LODINE) 500 MG tablet Take 500 mg by mouth 2 (two) times daily.   fluticasone (FLONASE) 50 MCG/ACT nasal spray SPRAY 2 SPRAYS INTO EACH NOSTRIL EVERY DAY   Fluticasone-Umeclidin-Vilant (TRELEGY ELLIPTA) 100-62.5-25 MCG/ACT AEPB Inhale 1 puff into the lungs daily.   gabapentin (NEURONTIN) 300 MG capsule Take 300 mg by mouth at bedtime.   Glucose Blood (BLOOD GLUCOSE TEST STRIPS) STRP 1 each by In Vitro route in the morning, at noon, and at bedtime. May substitute to any manufacturer covered by patient's insurance.   Lancet Device MISC 1 each by Does not apply route in the morning, at noon, and at bedtime. May substitute to any manufacturer covered by patient's insurance.   Lancets Misc. (ACCU-CHEK FASTCLIX LANCET) KIT PLEASE SEE ATTACHED FOR DETAILED DIRECTIONS   levothyroxine (SYNTHROID) 75 MCG tablet Take 1 tablet (75 mcg total) by mouth daily before breakfast.   linaclotide (LINZESS)  145 MCG CAPS capsule Take 1 capsule (145 mcg total) by mouth daily before breakfast.   loratadine (CLARITIN) 10 MG tablet TAKE 1 TABLET (10 MG) BY MOUTH EVERY DAY AT BEDTIME, OTC NOT COVERED   Melatonin 10 MG TABS Take 10 mg by mouth at bedtime.   montelukast (SINGULAIR) 10 MG tablet Take 1 tablet (10 mg total) by mouth at bedtime.   omeprazole (PRILOSEC) 20 MG capsule Take 20 mg by mouth daily.   rizatriptan (MAXALT) 10 MG tablet TAKE 1 TABLET (10 MG TOTAL) BY MOUTH DAILY AS NEEDED FOR MIGRAINE. NO MORE THAN 2 IN 48 HOURS   rosuvastatin (CRESTOR) 20 MG tablet TAKE 1 TABLET(20 MG) BY MOUTH DAILY   SUMAtriptan (IMITREX) 50 MG tablet TAKE 1 TAB BY MOUTH ON ONSET OF HEADACHE. MAY REPEAT IN 2 HRS IF H.A.. PERSISTS OR REOCCURS. MAX 2 TABS/24 HRS   traZODone (DESYREL) 100 MG tablet TAKE 1 TABLET(100 MG) BY MOUTH AT BEDTIME   valACYclovir (VALTREX) 500  MG tablet Take 1 tablet (500 mg total) by mouth daily.     Allergies:   Patient has no known allergies.   Social History   Socioeconomic History   Marital status: Widowed    Spouse name: Not on file   Number of children: 3   Years of education: Not on file   Highest education level: 10th grade  Occupational History   Occupation: disabled    Comment: retired  Tobacco Use   Smoking status: Former    Current packs/day: 0.00    Average packs/day: 2.0 packs/day for 30.0 years (60.0 ttl pk-yrs)    Types: Cigarettes    Start date: 09/03/1978    Quit date: 09/03/2008    Years since quitting: 15.1   Smokeless tobacco: Never  Vaping Use   Vaping status: Never Used  Substance and Sexual Activity   Alcohol use: No   Drug use: No   Sexual activity: Never  Other Topics Concern   Not on file  Social History Narrative   She used to work at  [employer], but had a left knee work related injury 03/10/2016 , require left knee surgery and is now having back pain, that is getting evaluated, Out of work since.       Patient recently found out that one of  her twins [name]) was diagnosed with breast cancer); will be having a double mastectomy soon.      Pt lives alone   Social Drivers of Health   Financial Resource Strain: Low Risk  (07/17/2022)   Overall Financial Resource Strain (CARDIA)    Difficulty of Paying Living Expenses: Not hard at all  Food Insecurity: No Food Insecurity (07/17/2022)   Hunger Vital Sign    Worried About Running Out of Food in the Last Year: Never true    Ran Out of Food in the Last Year: Never true  Transportation Needs: No Transportation Needs (07/17/2022)   PRAPARE - Administrator, Civil Service (Medical): No    Lack of Transportation (Non-Medical): No  Physical Activity: Inactive (07/17/2022)   Exercise Vital Sign    Days of Exercise per Week: 0 days    Minutes of Exercise per Session: 0 min  Stress: No Stress Concern Present (07/17/2022)   [employer] of Occupational Health - Occupational Stress Questionnaire    Feeling of Stress : Only a little  Social Connections: Socially Isolated (07/17/2022)   Social Connection and Isolation Panel [NHANES]    Frequency of Communication with Friends and Family: More than three times a week    Frequency of Social Gatherings with Friends and Family: Never    Attends Religious Services: Never    Database administrator or Organizations: No    Attends Banker Meetings: Never    Marital Status: Widowed     Family History: The patient's family history includes Alcohol abuse in her sister; Anxiety disorder in her daughter and sister; Bipolar disorder in her sister; Breast cancer in her daughter; Breast cancer (age of onset: 32) in her maternal aunt and paternal aunt; Depression in her sister; Diabetes in her father; Heart attack in her daughter; Heart disease in her father; Hypertension in her daughter; Pneumonia in her sister; Skin cancer in her daughter.  ROS:   Please see the history of present illness.     All other systems  reviewed and are negative.  EKGs/Labs/Other Studies Reviewed:    The following studies were reviewed today:   EKG Interpretation  Date/Time:  Tuesday November 03 2023 11:21:27 EDT Ventricular Rate:  103 PR Interval:  192 QRS Duration:  104 QT Interval:  382 QTC Calculation: 500 R Axis:   1  Text Interpretation: Sinus tachycardia Possible Anterior infarct , age undetermined Confirmed by Debbe Odea ([id number]) on 11/03/2023 11:42:59 AM    Recent Labs: 08/14/2023: ALT 10; BUN 13; Creat 1.00; Hemoglobin 13.3; Platelets 228; Potassium 4.0; Sodium 142; TSH 2.71  Recent Lipid Panel    Component Value Date/Time   CHOL 134 08/14/2023 1359   CHOL 105 03/11/2019 0918   TRIG 63 08/14/2023 1359   HDL 76 08/14/2023 1359   HDL 60 03/11/2019 0918   CHOLHDL 1.8 08/14/2023 1359   LDLCALC 44 08/14/2023 1359    Physical Exam:    VS:  BP 115/72 (BP Location: Left Arm, Patient Position: Sitting, Cuff Size: Normal)   Pulse (!) 103   Ht 4\' 9"  (1.448 m)   Wt 158 lb 12.8 oz (72 kg)   SpO2 93%   BMI 34.36 kg/m     Wt Readings from Last 3 Encounters:  11/03/23 158 lb 12.8 oz (72 kg)  07/16/23 160 lb 14.4 oz (73 kg)  05/07/23 148 lb 6.4 oz (67.3 kg)     GEN:  Well nourished, well developed in no acute distress HEENT: Normal NECK: No JVD; No carotid bruits CARDIAC: RRR, no murmurs, rubs, gallops RESPIRATORY: Decreased breath sounds, rhonchi at bases. ABDOMEN: Soft, non-tender, non-distended MUSCULOSKELETAL:  No edema; No deformity  SKIN: Warm and dry NEUROLOGIC:  Alert and oriented x 3 PSYCHIATRIC:  Normal affect   ASSESSMENT:    1. Coronary artery disease involving native coronary artery of native heart without angina pectoris   2. Pure hypercholesterolemia   3. Acute cough     PLAN:    In order of problems listed above:  nonobstructive CAD, mod LCx disease, coronary calcium score 185, EF 60 to 65%.  1 episode of chest pain 3 weeks ago, no chest pain today.  Monitor for now.   If pain returns, will consider stress testing.    Continue Crestor 20 mg daily.  Not on aspirin due to easy bruisability Hyperlipidemia, LDL controlled, continue Crestor 20 mg daily. Dry cough, history of COPD.  This could be an acute bronchitis.  Will prescribe Z-Pak x 5 days.  Advised to schedule appointment with PCP for follow-up.  Follow-up in 1 year.  This note was generated in part or whole with voice recognition software. Voice recognition is usually quite accurate but there are transcription errors that can and very often do occur. I apologize for any typographical errors that were not detected and corrected.  Medication Adjustments/Labs and Tests Ordered: Current medicines are reviewed at length with the patient today.  Concerns regarding medicines are outlined above.  Orders Placed This Encounter  Procedures   EKG 12-Lead   Meds ordered this encounter  Medications   azithromycin (ZITHROMAX Z-PAK) 250 MG tablet    Sig: Take as prescribed with food for 5 days.    Dispense:  6 each    Refill:  0    Add instructions.    Patient Instructions  Medication Instructions:   Your Physician recommend you continue on your current medication as directed.     *If you need a refill on your cardiac medications before your next appointment, please call your pharmacy*  Lab Work: No labs ordered today   Testing/Procedures: No test ordered today   Follow-Up: At [facility],  you and your health needs are our priority.  As part of our continuing mission to provide you with exceptional heart care, our providers are all part of one team.  This team includes your primary Cardiologist (physician) and Advanced Practice Providers or APPs (Physician Assistants and Nurse Practitioners) who all work together to provide you with the care you need, when you need it.  Your next appointment:   1 year(s)  Provider:   You may see Dr. Azucena Cecil or one of the following Advanced Practice  Providers on your designated Care Team:   Nicolasa Ducking, NP Ames Dura, PA-C Eula Listen, PA-C Cadence [place], PA-C Charlsie Quest, NP Carlos Levering, NP    We recommend signing up for the patient portal called "MyChart".  Sign up information is provided on this After Visit Summary.  MyChart is used to connect with patients for Virtual Visits (Telemedicine).  Patients are able to view lab/test results, encounter notes, upcoming appointments, etc.  Non-urgent messages can be sent to your provider as well.   To learn more about what you can do with MyChart, go to [email or web address].         Signed, Debbe Odea, MD  11/03/2023 12:19 PM    [facility]

## 2023-11-04 ENCOUNTER — Ambulatory Visit (INDEPENDENT_AMBULATORY_CARE_PROVIDER_SITE_OTHER): Admitting: Internal Medicine

## 2023-11-04 ENCOUNTER — Other Ambulatory Visit: Payer: Self-pay

## 2023-11-04 ENCOUNTER — Encounter: Payer: Self-pay | Admitting: Internal Medicine

## 2023-11-04 VITALS — BP 130/80 | HR 94 | Temp 98.1°F | Resp 16 | Ht <= 58 in | Wt 158.5 lb

## 2023-11-04 DIAGNOSIS — R051 Acute cough: Secondary | ICD-10-CM

## 2023-11-04 DIAGNOSIS — J441 Chronic obstructive pulmonary disease with (acute) exacerbation: Secondary | ICD-10-CM

## 2023-11-04 MED ORDER — ALBUTEROL SULFATE HFA 108 (90 BASE) MCG/ACT IN AERS: INHALATION_SPRAY | RESPIRATORY_TRACT | 0 refills | Status: DC

## 2023-11-04 MED ORDER — HYDROCOD POLI-CHLORPHE POLI ER 10-8 MG/5ML PO SUER: 5.0000 mL | Freq: Two times a day (BID) | ORAL | 0 refills | Status: AC | PRN

## 2023-11-04 MED ORDER — PREDNISONE 20 MG PO TABS: 40.0000 mg | ORAL_TABLET | Freq: Every day | ORAL | 0 refills | Status: AC

## 2023-11-04 NOTE — Progress Notes (Signed)
 Acute Office Visit  Subjective:     Patient ID: Sara Andrews, female    DOB: 1956/02/12, 68 y.o.   MRN: [id number]  Chief Complaint  Patient presents with   Cough    For 7 weeks    Cough Associated symptoms include shortness of breath and wheezing. Pertinent negatives include no chills, fever or sore throat.   Patient is in today for ongoing cough.   Discussed the use of AI scribe software for clinical note transcription with the patient, who gave verbal consent to proceed.  History of Present Illness The patient, with a history of COPD, presents with a persistent cough and shortness of breath for several weeks. She has tried over-the-counter cold and flu medications, including a product starting with a D, cough drops, and Nyquil, which provided temporary relief but did not resolve the symptoms. The cough is productive with clear, thick phlegm. She denies fever, sinus pain, ear pain, and sore throat. She reports increased shortness of breath with activity. She is currently on Trelegy and a rescue inhaler, which she uses twice daily. She has recently started a Z-Pak prescribed by her cardiologist.    Review of Systems  Constitutional:  Negative for chills and fever.  HENT:  Negative for congestion, sinus pain and sore throat.   Respiratory:  Positive for cough, shortness of breath and wheezing.         Objective:    BP 130/80 (Cuff Size: Large)   Pulse 94   Temp 98.1 F (36.7 C) (Oral)   Resp 16   Ht 4\' 9"  (1.448 m)   Wt 158 lb 8 oz (71.9 kg)   SpO2 96%   BMI 34.30 kg/m  BP Readings from Last 3 Encounters:  11/04/23 130/80  11/03/23 115/72  07/16/23 96/64   Wt Readings from Last 3 Encounters:  11/04/23 158 lb 8 oz (71.9 kg)  11/03/23 158 lb 12.8 oz (72 kg)  07/16/23 160 lb 14.4 oz (73 kg)      Physical Exam Constitutional:      Appearance: Normal appearance.  HENT:     Head: Normocephalic and atraumatic.     Right Ear: Tympanic membrane, ear  canal and external ear normal.     Left Ear: Tympanic membrane, ear canal and external ear normal.     Nose: Nose normal.     Mouth/Throat:     Mouth: Mucous membranes are moist.     Pharynx: Oropharynx is clear.  Eyes:     Conjunctiva/sclera: Conjunctivae normal.  Cardiovascular:     Rate and Rhythm: Normal rate and regular rhythm.  Pulmonary:     Effort: Pulmonary effort is normal.     Breath sounds: Normal breath sounds. No wheezing, rhonchi or rales.     Comments: Decreased air movement  Skin:    General: Skin is warm and dry.  Neurological:     General: No focal deficit present.     Mental Status: She is alert. Mental status is at baseline.  Psychiatric:        Mood and Affect: Mood normal.        Behavior: Behavior normal.     No results found for any visits on 11/04/23.      Assessment & Plan:   Assessment and Plan Assessment & Plan COPD exacerbation Exacerbation likely due to viral infection or allergies. On Trelegy and azithromycin. Steroids and cough suppressant recommended. Explained post-viral cough duration and advised on rescue inhaler use. - Prescribe  high-dose steroids for five days. - Continue azithromycin as prescribed. - Prescribe Tussionex with codeine for cough, to be used at night for five days. - Refill albuterol inhaler. - Advise to use rescue inhaler every four to six hours as needed. - Instruct to return if symptoms do not improve or worsen after five days for potential chest x-ray.  Follow-up Follow-up scheduled with Dr. Carlynn Purl on April 22. - Instruct to return if symptoms do not improve or worsen after five days.   Return for already scheduled.  Margarita Mail, DO

## 2023-11-06 ENCOUNTER — Other Ambulatory Visit: Payer: Self-pay | Admitting: Gastroenterology

## 2023-11-11 DIAGNOSIS — M5416 Radiculopathy, lumbar region: Secondary | ICD-10-CM | POA: Diagnosis not present

## 2023-11-11 DIAGNOSIS — M47816 Spondylosis without myelopathy or radiculopathy, lumbar region: Secondary | ICD-10-CM | POA: Diagnosis not present

## 2023-11-11 DIAGNOSIS — M545 Low back pain, unspecified: Secondary | ICD-10-CM | POA: Diagnosis not present

## 2023-11-11 DIAGNOSIS — M4326 Fusion of spine, lumbar region: Secondary | ICD-10-CM | POA: Diagnosis not present

## 2023-11-16 DIAGNOSIS — E785 Hyperlipidemia, unspecified: Secondary | ICD-10-CM | POA: Diagnosis not present

## 2023-11-16 DIAGNOSIS — E1169 Type 2 diabetes mellitus with other specified complication: Secondary | ICD-10-CM | POA: Diagnosis not present

## 2023-11-16 DIAGNOSIS — F32A Depression, unspecified: Secondary | ICD-10-CM | POA: Diagnosis not present

## 2023-11-16 DIAGNOSIS — G4733 Obstructive sleep apnea (adult) (pediatric): Secondary | ICD-10-CM | POA: Diagnosis not present

## 2023-11-16 DIAGNOSIS — E538 Deficiency of other specified B group vitamins: Secondary | ICD-10-CM | POA: Diagnosis not present

## 2023-11-16 DIAGNOSIS — G20A1 Parkinson's disease without dyskinesia, without mention of fluctuations: Secondary | ICD-10-CM | POA: Diagnosis not present

## 2023-11-17 ENCOUNTER — Ambulatory Visit: Payer: Self-pay | Admitting: Family Medicine

## 2023-11-17 ENCOUNTER — Encounter: Payer: Self-pay | Admitting: Family Medicine

## 2023-11-17 VITALS — BP 118/76 | HR 85 | Temp 97.8°F | Resp 16 | Ht <= 58 in | Wt 157.4 lb

## 2023-11-17 DIAGNOSIS — M810 Age-related osteoporosis without current pathological fracture: Secondary | ICD-10-CM

## 2023-11-17 DIAGNOSIS — E785 Hyperlipidemia, unspecified: Secondary | ICD-10-CM | POA: Diagnosis not present

## 2023-11-17 DIAGNOSIS — J4489 Other specified chronic obstructive pulmonary disease: Secondary | ICD-10-CM

## 2023-11-17 DIAGNOSIS — G43009 Migraine without aura, not intractable, without status migrainosus: Secondary | ICD-10-CM

## 2023-11-17 DIAGNOSIS — E038 Other specified hypothyroidism: Secondary | ICD-10-CM

## 2023-11-17 DIAGNOSIS — G20B1 Parkinson's disease with dyskinesia, without mention of fluctuations: Secondary | ICD-10-CM

## 2023-11-17 DIAGNOSIS — F331 Major depressive disorder, recurrent, moderate: Secondary | ICD-10-CM | POA: Diagnosis not present

## 2023-11-17 DIAGNOSIS — E1169 Type 2 diabetes mellitus with other specified complication: Secondary | ICD-10-CM | POA: Diagnosis not present

## 2023-11-17 DIAGNOSIS — I700 Atherosclerosis of aorta: Secondary | ICD-10-CM

## 2023-11-17 MED ORDER — DULOXETINE HCL 60 MG PO CPEP: 60.0000 mg | ORAL_CAPSULE | Freq: Every morning | ORAL | 1 refills | Status: DC

## 2023-11-17 MED ORDER — ROSUVASTATIN CALCIUM 20 MG PO TABS: ORAL_TABLET | ORAL | 1 refills | Status: DC

## 2023-11-17 MED ORDER — HYDROCOD POLI-CHLORPHE POLI ER 10-8 MG/5ML PO SUER: 5.0000 mL | Freq: Every evening | ORAL | 0 refills | Status: DC | PRN

## 2023-11-17 MED ORDER — CARIPRAZINE HCL 1.5 MG PO CAPS
1.5000 mg | ORAL_CAPSULE | Freq: Every day | ORAL | 1 refills | Status: DC
Start: 2023-11-17 — End: 2024-03-22

## 2023-11-17 MED ORDER — TRELEGY ELLIPTA 100-62.5-25 MCG/ACT IN AEPB: 1.0000 | INHALATION_SPRAY | Freq: Every day | RESPIRATORY_TRACT | 1 refills | Status: DC

## 2023-11-17 MED ORDER — BUPROPION HCL ER (XL) 150 MG PO TB24: ORAL_TABLET | ORAL | 1 refills | Status: DC

## 2023-11-17 MED ORDER — LEVOTHYROXINE SODIUM 75 MCG PO TABS: 75.0000 ug | ORAL_TABLET | Freq: Every day | ORAL | 1 refills | Status: DC

## 2023-11-17 NOTE — Progress Notes (Signed)
 Name: Sara Andrews   MRN: [id number]    DOB: [birth year]   Date:11/17/2023       Progress Note  Subjective  Chief Complaint  Chief Complaint  Patient presents with   Medical Management of Chronic Issues   Discussed the use of AI scribe software for clinical note transcription with the patient, who gave verbal consent to proceed.  History of Present Illness Sara Andrews is a 68 year old female with asthma and COPD overlap syndrome who presents for follow-up after a recent flare.  She experienced a COPD and asthma flare on November 03, 2023, treated with a Z-Pak, prednisone , and cough syrup, which alleviated her symptoms. However, she continues to have a mild lingering cough. She denies fever or chills. She uses Trelegy daily and has albuterol  available for use as needed.  Her diabetes is managed through diet control, with her last A1c recorded at 5.6 in January. She follows a 'chemo diet' consisting of green vegetables and meats, resulting in a weight loss of about 10 pounds. She is not currently on any diabetes medication.  She has Parkinson's disease and recently had her medication adjusted by her neurologist. She was switched from three pills to one stronger pill of Sinemet, taken three times a day. She experiences tremors, including lip tremors, but has not had any falls.  She has obstructive sleep apnea but has not received a CPAP machine despite a diagnosis. She experiences difficulty sleeping, often waking up in the middle of the night and struggling to return to sleep.  She takes rosuvastatin  for hyperlipidemia and is due for a refill in June. Her cholesterol levels were reported as good in her last blood work.  She experiences constipation and takes Linzess , though not daily due to concerns about diarrhea.  She has a history of migraines, which have been less frequent recently. She sees a specialist for this condition.  She is under significant stress due to her sister's  lung cancer diagnosis and treatment, which affects her emotional well-being and sleep.    Patient Active Problem List   Diagnosis Date Noted   Screening for colon cancer 05/07/2023   Adenomatous polyp of cecum 05/07/2023   CAD (coronary artery disease), native coronary artery 01/09/2023   Hiatal hernia 10/24/2022   Chronic GERD 10/24/2022   Spasms of the hands or feet 01/10/2022   Insomnia due to mental disorder 01/10/2022   Tendinitis of upper biceps tendon of left shoulder 05/22/2021   Rotator cuff tendinitis, left 05/22/2021   Nontraumatic incomplete tear of left rotator cuff 05/22/2021   History of shingles 03/23/2020   Burning sensation of lower extremity 03/22/2020   Centrilobular emphysema (HCC) 01/12/2020   Senile purpura (HCC) 01/12/2020   Major depression in remission (HCC) 01/12/2020   Dyslipidemia associated with type 2 diabetes mellitus (HCC) 01/12/2020   Bursitis of hip 08/23/2019   Carpal tunnel syndrome 08/23/2019   Thoracic aorta atherosclerosis (HCC) 08/03/2019   Spondylosis of lumbar spine 11/06/2016   Spinal stenosis of lumbar region with neurogenic claudication 10/28/2016   Chronic bilateral low back pain with bilateral sciatica 09/16/2016   Moderate episode of recurrent major depressive disorder (HCC) 08/07/2016   Pain of left heel 06/02/2016   Degenerative cervical disc 03/04/2016   Gastroesophageal reflux disease without esophagitis 02/01/2016   Primary osteoarthritis of right hand 02/01/2016   Paresthesias in left hand 02/01/2016   History of smoking 10/05/2013   Hyperlipidemia 10/05/2013   Adult onset hypothyroidism 10/05/2013  Past Surgical History:  Procedure Laterality Date   ABDOMINAL HYSTERECTOMY     CARPAL TUNNEL RELEASE Right    COLON SURGERY     COLONOSCOPY WITH PROPOFOL  N/A 05/07/2023   Procedure: COLONOSCOPY WITH PROPOFOL ;  Surgeon: Selena Daily, MD;  Location: [facility] ENDOSCOPY;  Service: Gastroenterology;  Laterality: N/A;    ESOPHAGOGASTRODUODENOSCOPY (EGD) WITH PROPOFOL  N/A 10/24/2022   Procedure: ESOPHAGOGASTRODUODENOSCOPY (EGD) WITH PROPOFOL ;  Surgeon: Selena Daily, MD;  Location: [facility] ENDOSCOPY;  Service: Gastroenterology;  Laterality: N/A;   FRACTURE SURGERY     HEMOSTASIS CLIP PLACEMENT  05/07/2023   Procedure: HEMOSTASIS CLIP PLACEMENT;  Surgeon: Selena Daily, MD;  Location: [facility] ENDOSCOPY;  Service: Gastroenterology;;   LUMBAR LAMINECTOMY  03/16/2018   L4-5 Laminectomy & Fusion w/ Pedicle Screws, TLIF & Allograft; Surgeon: Gwenn Lenz, MD; Location: [facility] MAIN OR; Service: Orthopedics; Laterality: N/A; Prone, ProAxis, Stim Neuromonitoring, O-Arm, Cell Saver, [employer], [employer], Justin, PACS on [facility]     OOPHORECTOMY Bilateral    ORIF TIBIA PLATEAU Left 03/10/2016   Procedure: OPEN REDUCTION INTERNAL FIXATION (ORIF) BICONDYLAR TIBIAL PLATEAU FRACTURE;  Surgeon: Adah Acron, MD;  Location: [facility] OR;  Service: Orthopedics;  Laterality: Left;   POLYPECTOMY  05/07/2023   Procedure: POLYPECTOMY;  Surgeon: Selena Daily, MD;  Location: [facility] ENDOSCOPY;  Service: Gastroenterology;;   SHOULDER ARTHROSCOPY W/ ROTATOR CUFF REPAIR Right    SHOULDER ARTHROSCOPY WITH SUBACROMIAL DECOMPRESSION, ROTATOR CUFF REPAIR AND BICEP TENDON REPAIR Left 05/21/2021   Procedure: SHOULDER ARTHROSCOPY WITH DEBRIDEMENT, DECOMPRESSION, REPAIR OF PARTIAL THICKNESS ROTATOR CUFF REPAIR, BICEPS TENODESIS;  Surgeon: Elner Hahn, MD;  Location: [facility] ORS;  Service: Orthopedics;  Laterality: Left;   SUBMUCOSAL LIFTING INJECTION  05/07/2023   Procedure: SUBMUCOSAL LIFTING INJECTION;  Surgeon: Selena Daily, MD;  Location: [facility] ENDOSCOPY;  Service: Gastroenterology;;   TONSILLECTOMY AND ADENOIDECTOMY      Family History  Problem Relation Age of Onset   Diabetes Father    Heart disease Father    Depression Sister    Alcohol abuse Sister    Anxiety disorder Sister    Bipolar disorder Sister    Pneumonia Sister     Breast cancer Maternal Aunt 38   Breast cancer Paternal Aunt 90   Hypertension Daughter    Breast cancer Daughter    Skin cancer Daughter    Heart attack Daughter    Anxiety disorder Daughter     Social History   Tobacco Use   Smoking status: Former    Current packs/day: 0.00    Average packs/day: 2.0 packs/day for 30.0 years (60.0 ttl pk-yrs)    Types: Cigarettes    Start date: 09/03/1978    Quit date: 09/03/2008    Years since quitting: 15.2   Smokeless tobacco: Never  Substance Use Topics   Alcohol use: No     Current Outpatient Medications:    albuterol  (VENTOLIN  HFA) 108 (90 Base) MCG/ACT inhaler, INHALE 2 PUFFS INTO LUNGS EVERY 6 HOURS AS NEEDED FOR WHEEZING OR SHORTNESS OF BREATH, Disp: 6.7 each, Rfl: 0   Blood Glucose Monitoring Suppl DEVI, 1 each by Does not apply route in the morning, at noon, and at bedtime. May substitute to any manufacturer covered by patient's insurance., Disp: 1 each, Rfl: 0   calcium  carbonate (OSCAL) 1500 (600 Ca) MG TABS tablet, Take 1,500 mg by mouth 2 (two) times daily with a meal., Disp: , Rfl:    carbidopa-levodopa (SINEMET IR) 25-100 MG tablet, Take 1 tablet by mouth  3 (three) times daily., Disp: , Rfl:    celecoxib (CELEBREX) 200 MG capsule, Take 200 mg by mouth., Disp: , Rfl:    Cholecalciferol (VITAMIN D  PO), Take 1,000 Units by mouth daily. 1000 IU, Disp: , Rfl:    clobetasol  ointment (TEMOVATE ) 0.05 %, Apply 1 Application topically 2 (two) times daily., Disp: 60 g, Rfl: 0   cyanocobalamin  (VITAMIN B12) 1000 MCG tablet, Take 1,000 mcg by mouth daily., Disp: , Rfl:    etodolac (LODINE) 500 MG tablet, Take 500 mg by mouth 2 (two) times daily., Disp: , Rfl:    fluticasone  (FLONASE ) 50 MCG/ACT nasal spray, SPRAY 2 SPRAYS INTO EACH NOSTRIL EVERY DAY, Disp: 48 mL, Rfl: 0   gabapentin  (NEURONTIN ) 300 MG capsule, Take 300 mg by mouth at bedtime., Disp: , Rfl:    Glucose Blood (BLOOD GLUCOSE TEST STRIPS) STRP, 1 each by In Vitro route in the  morning, at noon, and at bedtime. May substitute to any manufacturer covered by patient's insurance., Disp: 300 each, Rfl: 2   Lancet Device MISC, 1 each by Does not apply route in the morning, at noon, and at bedtime. May substitute to any manufacturer covered by patient's insurance., Disp: 3 each, Rfl: 2   Lancets Misc. (ACCU-CHEK FASTCLIX LANCET) KIT, , Disp: , Rfl:    linaclotide  (LINZESS ) 145 MCG CAPS capsule, TAKE 1 CAPSULE BY MOUTH DAILY BEFORE BREAKFAST., Disp: 30 capsule, Rfl: 3   loratadine  (CLARITIN ) 10 MG tablet, TAKE 1 TABLET (10 MG) BY MOUTH EVERY DAY AT BEDTIME, OTC NOT COVERED, Disp: 90 tablet, Rfl: 1   Melatonin 10 MG TABS, Take 10 mg by mouth at bedtime., Disp: , Rfl:    montelukast  (SINGULAIR ) 10 MG tablet, Take 1 tablet (10 mg total) by mouth at bedtime., Disp: 90 tablet, Rfl: 1   omeprazole  (PRILOSEC) 20 MG capsule, Take 20 mg by mouth daily., Disp: , Rfl:    rizatriptan (MAXALT) 10 MG tablet, TAKE 1 TABLET (10 MG TOTAL) BY MOUTH DAILY AS NEEDED FOR MIGRAINE. NO MORE THAN 2 IN 48 HOURS, Disp: 9 tablet, Rfl: 0   SUMAtriptan  (IMITREX ) 50 MG tablet, TAKE 1 TAB BY MOUTH ON ONSET OF HEADACHE. MAY REPEAT IN 2 HRS IF H.A.. PERSISTS OR REOCCURS. MAX 2 TABS/24 HRS, Disp: 9 tablet, Rfl: 1   traZODone  (DESYREL ) 100 MG tablet, TAKE 1 TABLET(100 MG) BY MOUTH AT BEDTIME, Disp: 90 tablet, Rfl: 1   valACYclovir  (VALTREX ) 500 MG tablet, Take 1 tablet (500 mg total) by mouth daily., Disp: 90 tablet, Rfl: 3   buPROPion  (WELLBUTRIN  XL) 150 MG 24 hr tablet, TAKE 1 TABLET(150 MG) BY MOUTH DAILY, Disp: 90 tablet, Rfl: 1   cariprazine  (VRAYLAR ) 1.5 MG capsule, Take 1 capsule (1.5 mg total) by mouth daily., Disp: 90 capsule, Rfl: 1   chlorpheniramine-HYDROcodone  (TUSSIONEX) 10-8 MG/5ML, Take 5 mLs by mouth at bedtime as needed for cough., Disp: 115 mL, Rfl: 0   DULoxetine  (CYMBALTA ) 60 MG capsule, Take 1 capsule (60 mg total) by mouth every morning., Disp: 90 capsule, Rfl: 1    Fluticasone -Umeclidin-Vilant (TRELEGY ELLIPTA ) 100-62.5-25 MCG/ACT AEPB, Inhale 1 puff into the lungs daily., Disp: 180 each, Rfl: 1   levothyroxine  (SYNTHROID ) 75 MCG tablet, Take 1 tablet (75 mcg total) by mouth daily before breakfast., Disp: 90 tablet, Rfl: 1   rosuvastatin  (CRESTOR ) 20 MG tablet, TAKE 1 TABLET(20 MG) BY MOUTH DAILY, Disp: 90 tablet, Rfl: 1  No Known Allergies  I personally reviewed active problem list, medication list, allergies, family  history with the patient/caregiver today.   ROS  Ten systems reviewed and is negative except as mentioned in HPI    Objective Physical Exam Constitutional: Patient appears well-developed and well-nourished. Obese  No distress.  HEENT: head atraumatic, normocephalic, pupils equal and reactive to light, neck supple, Cardiovascular: Normal rate, regular rhythm and normal heart sounds.  No murmur heard. No BLE edema. Pulmonary/Chest: Effort normal and breath sounds normal. No respiratory distress. Abdominal: Soft.  There is no tenderness. Neuro: lips quivering, tremors of hands Psychiatric: Patient has a normal mood and affect. behavior is normal. Judgment and thought content normal.    Vitals:   11/17/23 0925  BP: 118/76  Pulse: 85  Resp: 16  Temp: 97.8 F (36.6 C)  TempSrc: Oral  SpO2: 93%  Weight: 157 lb 6.4 oz (71.4 kg)  Height: 4\' 9"  (1.448 m)    Body mass index is 34.06 kg/m.  No results found for this or any previous visit (from the past 2160 hours). Diabetic Foot Exam:     PHQ2/9:    11/17/2023    9:25 AM 07/16/2023   10:13 AM 03/16/2023   10:12 AM 11/13/2022    8:25 AM 09/17/2022    9:50 AM  Depression screen PHQ 2/9  Decreased Interest 1 1 2 2  0  Down, Depressed, Hopeless 1 1 0 1 0  PHQ - 2 Score 2 2 2 3  0  Altered sleeping 1 1 3  0 1  Tired, decreased energy 1 1 2  0 0  Change in appetite 0 0 3 0 0  Feeling bad or failure about yourself  0 0 2 0 0  Trouble concentrating 1 1 0 0 0  Moving slowly or  fidgety/restless 0 0 0 0 0  Suicidal thoughts 0 0 0 0 0  PHQ-9 Score 5 5 12 3 1   Difficult doing work/chores Somewhat difficult Somewhat difficult Somewhat difficult  Not difficult at all    phq 9 is positive  Fall Risk:    11/17/2023    9:17 AM 07/16/2023   10:13 AM 03/16/2023   10:12 AM 11/13/2022    8:24 AM 09/17/2022    9:49 AM  Fall Risk   Falls in the past year? 0 0 1 0 0  Number falls in past yr: 0 0 1 0 0  Injury with Fall? 0 0 0 0 0  Risk for fall due to : No Fall Risks No Fall Risks  No Fall Risks No Fall Risks  Follow up Falls prevention discussed;Education provided;Falls evaluation completed Falls prevention discussed;Education provided;Falls evaluation completed  Falls prevention discussed Falls prevention discussed;Education provided;Falls evaluation completed     Assessment & Plan COPD with asthma overlap syndrome Recent flare treated with azithromycin , prednisone , and cough syrup. Symptoms improved, lingering cough persists. Recovery may be prolonged due to overlap and pollen season. - Refill Trelegy inhaler. - Prescribe additional cough syrup for nighttime use.  Obstructive sleep apnea Diagnosed but CPAP not provided. Reports poor sleep quality.  Type 2 diabetes mellitus without complications Managed with diet control. Recent A1c was 5.6, may be higher due to prednisone . Following 'chemo diet' with 10-pound weight loss. - Continue diet control. - Monitor blood glucose levels. - Recheck A1c in four months unless done earlier by home health.  Hyperlipidemia Managed with rosuvastatin . Cholesterol levels good in recent blood work. - Continue rosuvastatin . - Refill rosuvastatin  prescription in June.  Hypothyroidism, unspecified Managed with levothyroxine . No symptoms reported. - Continue levothyroxine  75 mcg daily.  Parkinson's  disease with dyskinesia Managed with Sinemet. Recent adjustment to one pill three times a day. Lip tremors noted, possibly exacerbated  by Vraylar . - Continue Sinemet as prescribed. - Monitor for changes in tremors and dyskinesia.  Major depressive disorder, recurrent, moderate Managed with Vraylar , bupropion , and duloxetine . Concerns about Vraylar  contributing to lip tremors. Reports increased stress and poor sleep due to sister's cancer diagnosis. - Refer to psychiatrist for medication management and evaluation of Vraylar 's contribution to lip tremors.  Chronic constipation Managed with Linzess . Not taking daily due to fear of diarrhea, resulting in inadequate bowel movements. - Instruct to take Linzess  daily to regulate bowel movements.

## 2023-11-23 DIAGNOSIS — M6281 Muscle weakness (generalized): Secondary | ICD-10-CM | POA: Diagnosis not present

## 2023-11-23 DIAGNOSIS — M545 Low back pain, unspecified: Secondary | ICD-10-CM | POA: Diagnosis not present

## 2023-11-25 DIAGNOSIS — M545 Low back pain, unspecified: Secondary | ICD-10-CM | POA: Diagnosis not present

## 2023-11-25 DIAGNOSIS — M6281 Muscle weakness (generalized): Secondary | ICD-10-CM | POA: Diagnosis not present

## 2023-11-30 DIAGNOSIS — M6281 Muscle weakness (generalized): Secondary | ICD-10-CM | POA: Diagnosis not present

## 2023-11-30 DIAGNOSIS — M545 Low back pain, unspecified: Secondary | ICD-10-CM | POA: Diagnosis not present

## 2023-12-10 DIAGNOSIS — M6281 Muscle weakness (generalized): Secondary | ICD-10-CM | POA: Diagnosis not present

## 2023-12-10 DIAGNOSIS — M545 Low back pain, unspecified: Secondary | ICD-10-CM | POA: Diagnosis not present

## 2023-12-14 DIAGNOSIS — M6281 Muscle weakness (generalized): Secondary | ICD-10-CM | POA: Diagnosis not present

## 2023-12-14 DIAGNOSIS — M545 Low back pain, unspecified: Secondary | ICD-10-CM | POA: Diagnosis not present

## 2023-12-16 DIAGNOSIS — M4326 Fusion of spine, lumbar region: Secondary | ICD-10-CM | POA: Diagnosis not present

## 2023-12-16 DIAGNOSIS — M5416 Radiculopathy, lumbar region: Secondary | ICD-10-CM | POA: Diagnosis not present

## 2023-12-16 DIAGNOSIS — M47816 Spondylosis without myelopathy or radiculopathy, lumbar region: Secondary | ICD-10-CM | POA: Diagnosis not present

## 2023-12-16 DIAGNOSIS — M4807 Spinal stenosis, lumbosacral region: Secondary | ICD-10-CM | POA: Diagnosis not present

## 2023-12-22 ENCOUNTER — Other Ambulatory Visit: Payer: Self-pay | Admitting: Student

## 2023-12-22 DIAGNOSIS — M5416 Radiculopathy, lumbar region: Secondary | ICD-10-CM

## 2023-12-22 DIAGNOSIS — M4807 Spinal stenosis, lumbosacral region: Secondary | ICD-10-CM

## 2023-12-23 DIAGNOSIS — M810 Age-related osteoporosis without current pathological fracture: Secondary | ICD-10-CM | POA: Diagnosis not present

## 2024-01-04 ENCOUNTER — Ambulatory Visit
Admission: RE | Admit: 2024-01-04 | Discharge: 2024-01-04 | Disposition: A | Discharge status: Home / Self Care | Source: Ambulatory Visit | Attending: Acute Care | Admitting: Acute Care

## 2024-01-04 DIAGNOSIS — Z87891 Personal history of nicotine dependence: Secondary | ICD-10-CM | POA: Diagnosis not present

## 2024-01-04 DIAGNOSIS — Z122 Encounter for screening for malignant neoplasm of respiratory organs: Secondary | ICD-10-CM | POA: Diagnosis not present

## 2024-01-08 ENCOUNTER — Other Ambulatory Visit: Payer: Self-pay | Admitting: Family Medicine

## 2024-01-08 DIAGNOSIS — R0981 Nasal congestion: Secondary | ICD-10-CM

## 2024-01-10 ENCOUNTER — Other Ambulatory Visit: Payer: Self-pay | Admitting: Family Medicine

## 2024-01-10 DIAGNOSIS — G43009 Migraine without aura, not intractable, without status migrainosus: Secondary | ICD-10-CM

## 2024-01-11 ENCOUNTER — Other Ambulatory Visit: Payer: Self-pay | Admitting: Family Medicine

## 2024-01-14 NOTE — Progress Notes (Signed)
 Order(s) created erroneously. Erroneous order ID: [id number]  Order moved by: Debbra Fairy  Order move date/time: 01/14/2024 9:16 AM  Source Patient: [id number]  Source Contact: 09/03/2023  Destination Patient: [id number]  Destination Contact: 10/12/2012

## 2024-01-18 ENCOUNTER — Other Ambulatory Visit: Payer: Self-pay

## 2024-01-18 DIAGNOSIS — Z87891 Personal history of nicotine dependence: Secondary | ICD-10-CM

## 2024-01-18 DIAGNOSIS — Z122 Encounter for screening for malignant neoplasm of respiratory organs: Secondary | ICD-10-CM

## 2024-02-04 ENCOUNTER — Other Ambulatory Visit: Payer: Self-pay | Admitting: Family Medicine

## 2024-02-04 ENCOUNTER — Other Ambulatory Visit: Payer: Self-pay | Admitting: Internal Medicine

## 2024-02-04 DIAGNOSIS — B009 Herpesviral infection, unspecified: Secondary | ICD-10-CM

## 2024-02-04 DIAGNOSIS — J441 Chronic obstructive pulmonary disease with (acute) exacerbation: Secondary | ICD-10-CM

## 2024-02-04 DIAGNOSIS — G43009 Migraine without aura, not intractable, without status migrainosus: Secondary | ICD-10-CM

## 2024-02-04 DIAGNOSIS — F331 Major depressive disorder, recurrent, moderate: Secondary | ICD-10-CM

## 2024-02-05 NOTE — Telephone Encounter (Signed)
 Requested Prescriptions  Pending Prescriptions Disp Refills   albuterol  (VENTOLIN  HFA) 108 (90 Base) MCG/ACT inhaler [Pharmacy Med Name: ALBUTEROL  HFA (VENTOLIN ) INH] 18 each 1    Sig: INHALE 2 PUFFS INTO LUNGS EVERY 6 HOURS AS NEEDED FOR WHEEZING OR SHORTNESS OF BREATH     Pulmonology:  Beta Agonists 2 Passed - 02/05/2024  4:11 PM      Passed - Last BP in normal range    BP Readings from Last 1 Encounters:  11/17/23 118/76         Passed - Last Heart Rate in normal range    Pulse Readings from Last 1 Encounters:  11/17/23 85         Passed - Valid encounter within last 12 months    Recent Outpatient Visits           2 months ago Thoracic aorta atherosclerosis [facility])   [place] [facility] Glenard Mire, MD   3 months ago COPD exacerbation [facility])   [facility] Bernardo Fend, [place]

## 2024-02-15 DIAGNOSIS — R609 Edema, unspecified: Secondary | ICD-10-CM | POA: Diagnosis not present

## 2024-02-20 ENCOUNTER — Other Ambulatory Visit: Payer: Self-pay | Admitting: Family Medicine

## 2024-02-20 DIAGNOSIS — J4489 Other specified chronic obstructive pulmonary disease: Secondary | ICD-10-CM

## 2024-02-22 NOTE — Telephone Encounter (Signed)
 Requested by interface surescripts. Future visit 8/ 26/ 25.  Requested Prescriptions  Pending Prescriptions Disp Refills   montelukast  (SINGULAIR ) 10 MG tablet [Pharmacy Med Name: MONTELUKAST  SOD 10 MG TABLET] 90 tablet 1    Sig: TAKE 1 TABLET BY MOUTH EVERYDAY AT BEDTIME     Pulmonology:  Leukotriene Inhibitors Passed - 02/22/2024  2:47 PM      Passed - Valid encounter within last 12 months    Recent Outpatient Visits           3 months ago Thoracic aorta atherosclerosis [facility])   [facility] Glenard Mire, MD   3 months ago COPD exacerbation [facility])   [facility] Bernardo Fend, [place]

## 2024-03-09 ENCOUNTER — Other Ambulatory Visit: Payer: Self-pay | Admitting: Family Medicine

## 2024-03-09 DIAGNOSIS — F5105 Insomnia due to other mental disorder: Secondary | ICD-10-CM

## 2024-03-20 ENCOUNTER — Other Ambulatory Visit: Payer: Self-pay | Admitting: Family Medicine

## 2024-03-20 DIAGNOSIS — J4489 Other specified chronic obstructive pulmonary disease: Secondary | ICD-10-CM

## 2024-03-22 ENCOUNTER — Encounter: Payer: Self-pay | Admitting: Family Medicine

## 2024-03-22 ENCOUNTER — Ambulatory Visit (INDEPENDENT_AMBULATORY_CARE_PROVIDER_SITE_OTHER): Admitting: Family Medicine

## 2024-03-22 VITALS — BP 106/70 | HR 81 | Resp 16 | Ht <= 58 in | Wt 150.7 lb

## 2024-03-22 DIAGNOSIS — J4489 Other specified chronic obstructive pulmonary disease: Secondary | ICD-10-CM | POA: Diagnosis not present

## 2024-03-22 DIAGNOSIS — K219 Gastro-esophageal reflux disease without esophagitis: Secondary | ICD-10-CM

## 2024-03-22 DIAGNOSIS — Z8639 Personal history of other endocrine, nutritional and metabolic disease: Secondary | ICD-10-CM

## 2024-03-22 DIAGNOSIS — M810 Age-related osteoporosis without current pathological fracture: Secondary | ICD-10-CM | POA: Diagnosis not present

## 2024-03-22 DIAGNOSIS — G43009 Migraine without aura, not intractable, without status migrainosus: Secondary | ICD-10-CM | POA: Diagnosis not present

## 2024-03-22 DIAGNOSIS — G20A1 Parkinson's disease without dyskinesia, without mention of fluctuations: Secondary | ICD-10-CM | POA: Diagnosis not present

## 2024-03-22 DIAGNOSIS — F5105 Insomnia due to other mental disorder: Secondary | ICD-10-CM | POA: Diagnosis not present

## 2024-03-22 DIAGNOSIS — I700 Atherosclerosis of aorta: Secondary | ICD-10-CM | POA: Diagnosis not present

## 2024-03-22 DIAGNOSIS — F331 Major depressive disorder, recurrent, moderate: Secondary | ICD-10-CM | POA: Diagnosis not present

## 2024-03-22 DIAGNOSIS — E038 Other specified hypothyroidism: Secondary | ICD-10-CM | POA: Diagnosis not present

## 2024-03-22 DIAGNOSIS — E1169 Type 2 diabetes mellitus with other specified complication: Secondary | ICD-10-CM

## 2024-03-22 LAB — POCT GLYCOSYLATED HEMOGLOBIN (HGB A1C): Hemoglobin A1C: 5.6 % (ref 4.0–5.6)

## 2024-03-22 MED ORDER — LEVOTHYROXINE SODIUM 75 MCG PO TABS: 75.0000 ug | ORAL_TABLET | Freq: Every day | ORAL | 1 refills | Status: AC

## 2024-03-22 MED ORDER — DULOXETINE HCL 60 MG PO CPEP: 60.0000 mg | ORAL_CAPSULE | Freq: Every morning | ORAL | 1 refills | Status: DC

## 2024-03-22 MED ORDER — TRELEGY ELLIPTA 100-62.5-25 MCG/ACT IN AEPB: 1.0000 | INHALATION_SPRAY | Freq: Every day | RESPIRATORY_TRACT | 1 refills | Status: DC

## 2024-03-22 MED ORDER — CARIPRAZINE HCL 1.5 MG PO CAPS: 1.5000 mg | ORAL_CAPSULE | Freq: Every day | ORAL | 1 refills | Status: DC

## 2024-03-22 MED ORDER — ROSUVASTATIN CALCIUM 20 MG PO TABS: ORAL_TABLET | ORAL | 1 refills | Status: DC

## 2024-03-22 MED ORDER — TRAZODONE HCL 100 MG PO TABS: ORAL_TABLET | ORAL | 1 refills | Status: DC

## 2024-03-22 MED ORDER — BUPROPION HCL ER (XL) 150 MG PO TB24: ORAL_TABLET | ORAL | 1 refills | Status: DC

## 2024-03-22 MED ORDER — LEVOCETIRIZINE DIHYDROCHLORIDE 5 MG PO TABS: 5.0000 mg | ORAL_TABLET | Freq: Every evening | ORAL | 1 refills | Status: DC

## 2024-03-22 MED ORDER — OMEPRAZOLE 20 MG PO CPDR: 20.0000 mg | DELAYED_RELEASE_CAPSULE | Freq: Every day | ORAL | 1 refills | Status: DC

## 2024-03-22 NOTE — Patient Instructions (Signed)
 Unk Corinn Skiff, MD [address] [place], [place] [address] Phone: [phone]

## 2024-03-22 NOTE — Progress Notes (Signed)
 Name: Sara Andrews   MRN: [id number]    DOB: 11/21/1955   Date:03/22/2024       Progress Note  Subjective  Chief Complaint  Chief Complaint  Patient presents with   Medical Management of Chronic Issues   Discussed the use of AI scribe software for clinical note transcription with the patient, who gave verbal consent to proceed.  History of Present Illness Sara Andrews is a 68 year old female with diabetes and Parkinson's disease who presents for a follow-up visit.  She has lost six pounds since May and her diabetes is in remission with A1c levels at 5.6%. She is actively managing her diet to aid in weight loss.  Her Parkinson's disease is characterized by tremors, particularly in her right hand, which worsen with stress. No falls have occurred. Her neurologist recently adjusted her medication, increasing her carbidopa/levodopa dose to one and a half pills three times a day. She experiences balance issues, especially when descending stairs, but manages by getting up slowly and wearing supportive footwear.  She experiences migraine headaches without aura, which have improved and now occur sporadically. She uses Maxalt as needed for acute episodes and has not required preventive medication.  She has a history of asthma-COPD overlap syndrome and emphysema. She reports a persistent dry cough that requires significant effort to expel. She uses Trelegy daily and Singulair  for asthma management. She quit smoking 12 years ago and her recent lung cancer screening was negative, though she has emphysema and aortic atherosclerosis.  She experiences heartburn. She is currently taking rosuvastatin  20 mg for cholesterol management and has a history of hypothyroidism, for which she continues levothyroxine .  She has osteoporosis and receives Reclast  infusions annually. She reports sleep disturbances, sleeping only four hours at a time despite taking trazodone  and melatonin. No daytime  napping.  She has a history of major depressive disorder and is on Vraylar , Wellbutrin , and duloxetine . She has not been able to see a psychiatrist due to insurance limitations.    Patient Active Problem List   Diagnosis Date Noted   Screening for colon cancer 05/07/2023   Adenomatous polyp of cecum 05/07/2023   CAD (coronary artery disease), native coronary artery 01/09/2023   Hiatal hernia 10/24/2022   Chronic GERD 10/24/2022   Spasms of the hands or feet 01/10/2022   Insomnia due to mental disorder 01/10/2022   Tendinitis of upper biceps tendon of left shoulder 05/22/2021   Rotator cuff tendinitis, left 05/22/2021   Nontraumatic incomplete tear of left rotator cuff 05/22/2021   History of shingles 03/23/2020   Burning sensation of lower extremity 03/22/2020   Centrilobular emphysema (HCC) 01/12/2020   Senile purpura (HCC) 01/12/2020   Major depression in remission (HCC) 01/12/2020   Dyslipidemia associated with type 2 diabetes mellitus (HCC) 01/12/2020   Bursitis of hip 08/23/2019   Carpal tunnel syndrome 08/23/2019   Thoracic aorta atherosclerosis (HCC) 08/03/2019   Spondylosis of lumbar spine 11/06/2016   Spinal stenosis of lumbar region with neurogenic claudication 10/28/2016   Chronic bilateral low back pain with bilateral sciatica 09/16/2016   Moderate episode of recurrent major depressive disorder (HCC) 08/07/2016   Pain of left heel 06/02/2016   Degenerative cervical disc 03/04/2016   Gastroesophageal reflux disease without esophagitis 02/01/2016   Primary osteoarthritis of right hand 02/01/2016   Paresthesias in left hand 02/01/2016   History of smoking 10/05/2013   Hyperlipidemia 10/05/2013   Adult onset hypothyroidism 10/05/2013    Past Surgical History:  Procedure Laterality Date  ABDOMINAL HYSTERECTOMY     CARPAL TUNNEL RELEASE Right    COLON SURGERY     COLONOSCOPY WITH PROPOFOL  N/A 05/07/2023   Procedure: COLONOSCOPY WITH PROPOFOL ;  Surgeon: Unk Corinn Skiff, MD;  Location: [facility] ENDOSCOPY;  Service: Gastroenterology;  Laterality: N/A;   ESOPHAGOGASTRODUODENOSCOPY (EGD) WITH PROPOFOL  N/A 10/24/2022   Procedure: ESOPHAGOGASTRODUODENOSCOPY (EGD) WITH PROPOFOL ;  Surgeon: Unk Corinn Skiff, MD;  Location: [facility] ENDOSCOPY;  Service: Gastroenterology;  Laterality: N/A;   FRACTURE SURGERY     HEMOSTASIS CLIP PLACEMENT  05/07/2023   Procedure: HEMOSTASIS CLIP PLACEMENT;  Surgeon: Unk Corinn Skiff, MD;  Location: [facility] ENDOSCOPY;  Service: Gastroenterology;;   LUMBAR LAMINECTOMY  03/16/2018   L4-5 Laminectomy & Fusion w/ Pedicle Screws, TLIF & Allograft; Surgeon: Royden Elsie Schneider, MD; Location: [facility] MAIN OR; Service: Orthopedics; Laterality: N/A; Prone, ProAxis, Stim Neuromonitoring, O-Arm, Cell Saver, [employer], [employer], Justin, PACS on [facility]     OOPHORECTOMY Bilateral    ORIF TIBIA PLATEAU Left 03/10/2016   Procedure: OPEN REDUCTION INTERNAL FIXATION (ORIF) BICONDYLAR TIBIAL PLATEAU FRACTURE;  Surgeon: Oneil JAYSON Herald, MD;  Location: [facility] OR;  Service: Orthopedics;  Laterality: Left;   POLYPECTOMY  05/07/2023   Procedure: POLYPECTOMY;  Surgeon: Unk Corinn Skiff, MD;  Location: [facility] ENDOSCOPY;  Service: Gastroenterology;;   SHOULDER ARTHROSCOPY W/ ROTATOR CUFF REPAIR Right    SHOULDER ARTHROSCOPY WITH SUBACROMIAL DECOMPRESSION, ROTATOR CUFF REPAIR AND BICEP TENDON REPAIR Left 05/21/2021   Procedure: SHOULDER ARTHROSCOPY WITH DEBRIDEMENT, DECOMPRESSION, REPAIR OF PARTIAL THICKNESS ROTATOR CUFF REPAIR, BICEPS TENODESIS;  Surgeon: Edie Norleen PARAS, MD;  Location: [facility] ORS;  Service: Orthopedics;  Laterality: Left;   SUBMUCOSAL LIFTING INJECTION  05/07/2023   Procedure: SUBMUCOSAL LIFTING INJECTION;  Surgeon: Unk Corinn Skiff, MD;  Location: [facility] ENDOSCOPY;  Service: Gastroenterology;;   TONSILLECTOMY AND ADENOIDECTOMY      Family History  Problem Relation Age of Onset   Diabetes Father    Heart disease Father    Depression Sister    Alcohol  abuse Sister    Anxiety disorder Sister    Bipolar disorder Sister    Pneumonia Sister    Breast cancer Maternal Aunt 57   Breast cancer Paternal Aunt 33   Hypertension Daughter    Breast cancer Daughter    Skin cancer Daughter    Heart attack Daughter    Anxiety disorder Daughter     Social History   Tobacco Use   Smoking status: Former    Current packs/day: 0.00    Average packs/day: 2.0 packs/day for 30.0 years (60.0 ttl pk-yrs)    Types: Cigarettes    Start date: 09/03/1978    Quit date: 09/03/2008    Years since quitting: 15.5   Smokeless tobacco: Never  Substance Use Topics   Alcohol use: No     Current Outpatient Medications:    albuterol  (VENTOLIN  HFA) 108 (90 Base) MCG/ACT inhaler, INHALE 2 PUFFS INTO LUNGS EVERY 6 HOURS AS NEEDED FOR WHEEZING OR SHORTNESS OF BREATH, Disp: 18 each, Rfl: 1   Blood Glucose Monitoring Suppl DEVI, 1 each by Does not apply route in the morning, at noon, and at bedtime. May substitute to any manufacturer covered by patient's insurance., Disp: 1 each, Rfl: 0   buPROPion  (WELLBUTRIN  XL) 150 MG 24 hr tablet, TAKE 1 TABLET(150 MG) BY MOUTH DAILY, Disp: 90 tablet, Rfl: 1   carbidopa-levodopa (SINEMET IR) 25-100 MG tablet, Take 1 tablet by mouth 3 (three) times daily., Disp: , Rfl:    cariprazine  (VRAYLAR ) 1.5 MG  capsule, Take 1 capsule (1.5 mg total) by mouth daily., Disp: 90 capsule, Rfl: 1   celecoxib (CELEBREX) 200 MG capsule, Take 200 mg by mouth., Disp: , Rfl:    Cholecalciferol (VITAMIN D  PO), Take 1,000 Units by mouth daily. 1000 IU, Disp: , Rfl:    clobetasol  ointment (TEMOVATE ) 0.05 %, Apply 1 Application topically 2 (two) times daily., Disp: 60 g, Rfl: 0   cyanocobalamin  (VITAMIN B12) 1000 MCG tablet, Take 1,000 mcg by mouth daily., Disp: , Rfl:    DULoxetine  (CYMBALTA ) 60 MG capsule, Take 1 capsule (60 mg total) by mouth every morning., Disp: 90 capsule, Rfl: 1   etodolac (LODINE) 500 MG tablet, Take 500 mg by mouth 2 (two) times daily.,  Disp: , Rfl:    fluticasone  (FLONASE ) 50 MCG/ACT nasal spray, SPRAY 2 SPRAYS INTO EACH NOSTRIL EVERY DAY, Disp: 48 mL, Rfl: 0   Fluticasone -Umeclidin-Vilant (TRELEGY ELLIPTA ) 100-62.5-25 MCG/ACT AEPB, Inhale 1 puff into the lungs daily., Disp: 180 each, Rfl: 1   gabapentin  (NEURONTIN ) 300 MG capsule, Take 300 mg by mouth at bedtime., Disp: , Rfl:    Glucose Blood (BLOOD GLUCOSE TEST STRIPS) STRP, 1 each by In Vitro route in the morning, at noon, and at bedtime. May substitute to any manufacturer covered by patient's insurance., Disp: 300 each, Rfl: 2   Lancet Device MISC, 1 each by Does not apply route in the morning, at noon, and at bedtime. May substitute to any manufacturer covered by patient's insurance., Disp: 3 each, Rfl: 2   Lancets Misc. (ACCU-CHEK FASTCLIX LANCET) KIT, , Disp: , Rfl:    levothyroxine  (SYNTHROID ) 75 MCG tablet, Take 1 tablet (75 mcg total) by mouth daily before breakfast., Disp: 90 tablet, Rfl: 1   linaclotide  (LINZESS ) 145 MCG CAPS capsule, TAKE 1 CAPSULE BY MOUTH DAILY BEFORE BREAKFAST., Disp: 30 capsule, Rfl: 3   loratadine  (CLARITIN ) 10 MG tablet, TAKE 1 TABLET (10 MG) BY MOUTH EVERY DAY AT BEDTIME, OTC NOT COVERED, Disp: 90 tablet, Rfl: 1   Melatonin 10 MG TABS, Take 10 mg by mouth at bedtime., Disp: , Rfl:    montelukast  (SINGULAIR ) 10 MG tablet, TAKE 1 TABLET BY MOUTH EVERYDAY AT BEDTIME, Disp: 90 tablet, Rfl: 1   rizatriptan (MAXALT) 10 MG tablet, TAKE 1 TABLET (10 MG TOTAL) BY MOUTH DAILY AS NEEDED FOR MIGRAINE. NO MORE THAN 2 IN 48 HOURS, Disp: 9 tablet, Rfl: 0   rosuvastatin  (CRESTOR ) 20 MG tablet, TAKE 1 TABLET(20 MG) BY MOUTH DAILY, Disp: 90 tablet, Rfl: 1   traZODone  (DESYREL ) 100 MG tablet, TAKE 1 TABLET(100 MG) BY MOUTH AT BEDTIME, Disp: 90 tablet, Rfl: 1   valACYclovir  (VALTREX ) 500 MG tablet, Take 1 tablet (500 mg total) by mouth daily., Disp: 90 tablet, Rfl: 3   calcium  carbonate (OSCAL) 1500 (600 Ca) MG TABS tablet, Take 1,500 mg by mouth 2 (two) times  daily with a meal. (Patient not taking: Reported on 03/22/2024), Disp: , Rfl:    chlorpheniramine-HYDROcodone  (TUSSIONEX) 10-8 MG/5ML, Take 5 mLs by mouth at bedtime as needed for cough. (Patient not taking: Reported on 03/22/2024), Disp: 115 mL, Rfl: 0   omeprazole  (PRILOSEC) 20 MG capsule, Take 20 mg by mouth daily. (Patient not taking: Reported on 03/22/2024), Disp: , Rfl:   No Known Allergies  I personally reviewed active problem list, medication list, allergies with the patient/caregiver today.   ROS  Ten systems reviewed and is negative except as mentioned in HPI    Objective Physical Exam VITALS: BP- 110/68 CONSTITUTIONAL:  Patient appears well-developed and well-nourished. No distress. HEENT: Head atraumatic, normocephalic, neck supple. CARDIOVASCULAR: Normal rate, regular rhythm and normal heart sounds. No murmurs, rubs, or gallops. No BLE edema. PULMONARY: Effort normal and breath sounds normal. Lungs clear to auscultation bilaterally. No respiratory distress.SABRA PSYCHIATRIC: Patient has a normal mood and affect. Behavior is normal. Judgment and thought content normal.  Vitals:   03/22/24 0901  BP: 106/70  Pulse: 81  Resp: 16  SpO2: 95%  Weight: 150 lb 11.2 oz (68.4 kg)  Height: 4' 9 (1.448 m)    Body mass index is 32.61 kg/m.  Recent Results (from the past 2160 hours)  POCT glycosylated hemoglobin (Hb A1C)     Status: None   Collection Time: 03/22/24  9:05 AM  Result Value Ref Range   Hemoglobin A1C 5.6 4.0 - 5.6 %   HbA1c POC (<> result, manual entry)     HbA1c, POC (prediabetic range)     HbA1c, POC (controlled diabetic range)      PHQ2/9:    03/22/2024    8:54 AM 11/17/2023    9:25 AM 07/16/2023   10:13 AM 03/16/2023   10:12 AM 11/13/2022    8:25 AM  Depression screen PHQ 2/9  Decreased Interest 1 1 1 2 2   Down, Depressed, Hopeless 1 1 1  0 1  PHQ - 2 Score 2 2 2 2 3   Altered sleeping 1 1 1 3  0  Tired, decreased energy 1 1 1 2  0  Change in appetite 0 0 0  3 0  Feeling bad or failure about yourself  0 0 0 2 0  Trouble concentrating 1 1 1  0 0  Moving slowly or fidgety/restless 0 0 0 0 0  Suicidal thoughts 0 0 0 0 0  PHQ-9 Score 5 5 5 12 3   Difficult doing work/chores Somewhat difficult Somewhat difficult Somewhat difficult Somewhat difficult     phq 9 is positive  Fall Risk:    03/22/2024    8:50 AM 11/17/2023    9:17 AM 07/16/2023   10:13 AM 03/16/2023   10:12 AM 11/13/2022    8:24 AM  Fall Risk   Falls in the past year? 0 0 0 1 0  Number falls in past yr: 0 0 0 1 0  Injury with Fall? 0 0 0 0 0  Risk for fall due to : No Fall Risks No Fall Risks No Fall Risks  No Fall Risks  Follow up Falls evaluation completed Falls prevention discussed;Education provided;Falls evaluation completed Falls prevention discussed;Education provided;Falls evaluation completed  Falls prevention discussed     Assessment and Plan Assessment & Plan History of diabetes mellitus A1c consistently normal at 5.6%. Weight loss and diet management effective. Diagnosis changed to history of diabetes mellitus.  Parkinson's disease without dyskinesia or motor fluctuations Tremors in right hand worsen with stress. Balance issues noted, especially on stairs. - Update carbidopa/levodopa to 1.5 pills three times daily. - Ensure supportive footwear to prevent falls.  Asthma-COPD overlap syndrome with emphysema Persistent dry cough worsens at night. History of smoking, lung cancer screening negative. Emphysema and aortic atherosclerosis present. - Prescribe Xyzal  for allergy-related symptoms. - Consider Mucinex  for thick mucus. - Continue Trelegy daily. - Continue Singulair .  Migraine without aura Migraines improved, infrequent. Maxalt used as needed. - Continue Maxalt as needed.  Gastroesophageal reflux disease (GERD) Intermittent heartburn, off omeprazole . - Prescribe omeprazole  20 mg as needed.  Hypothyroidism Thyroid  function well-managed, same dose of  levothyroxine  continued. - Continue  levothyroxine . - Check thyroid  function at next visit.  Osteoporosis receiving annual Reclast  infusion Under endocrinologist care, receives annual Reclast  infusions. - Continue annual Reclast  infusions.  Insomnia Difficulty maintaining sleep, wakes after 4 hours. Trazodone  and melatonin used. Behavioral modifications discussed. - Advise on sleep hygiene, avoid TV before bed, consider reading. - Consider trazodone  upon waking in the night.  Major depressive disorder, recurrent On Vraylar , Wellbutrin , duloxetine , trazodone . Difficulty sleeping, no daytime naps. Limited psychiatric care options. - Continue current medications.  Aortic atherosclerosis with coronary artery calcification Aortic atherosclerosis with coronary artery calcification. On rosuvastatin , advised to maintain low blood pressure. - Consider starting baby aspirin  for heart disease prevention. - Continue rosuvastatin .  Hypotension Naturally low blood pressure. Advised on hydration and slow rising to prevent falls. - Advise on maintaining hydration. - Advise on rising slowly to prevent falls.  Chronic joint pain Prescribed Celebrex and another NSAID, advised to use only one to avoid gastrointestinal issues. Intermittent reflux possibly exacerbated by NSAID use. - Continue Celebrex 100 mg twice a day. - Discontinue other NSAID.

## 2024-03-31 ENCOUNTER — Other Ambulatory Visit: Payer: Self-pay | Admitting: Family Medicine

## 2024-03-31 DIAGNOSIS — B009 Herpesviral infection, unspecified: Secondary | ICD-10-CM

## 2024-04-10 ENCOUNTER — Other Ambulatory Visit: Payer: Self-pay | Admitting: Family Medicine

## 2024-04-10 DIAGNOSIS — R0981 Nasal congestion: Secondary | ICD-10-CM

## 2024-04-19 ENCOUNTER — Ambulatory Visit: Payer: Self-pay

## 2024-04-19 NOTE — Telephone Encounter (Signed)
 Scheduled next day OV. ED precautions reviewed including any shortness of breath or chest pain, pt verbalized understanding.   FYI Only or Action Required?: Action required by provider: update on patient condition.  Patient was last seen in primary care on 03/22/2024 by Glenard Mire, MD.  Called Nurse Triage reporting Leg Swelling.  Symptoms began yesterday.  Interventions attempted: Rest, hydration, or home remedies.  Symptoms are: unchanged.  Triage Disposition: See Physician Within 24 Hours  Patient/caregiver understands and will follow disposition?: Yes Reason for Disposition  [1] MODERATE leg swelling (e.g., swelling extends up to knees) AND [2] new-onset or getting worse  Answer Assessment - Initial Assessment Questions Patient denied any shortness of breath, confirmed multiple times. Advised to go to the ED if she experiences any shortness of breath or chest pain, pt verbalized understanding. Next day OV scheduled.  1. ONSET: When did the swelling start? (e.g., minutes, hours, days)     Yesterday, noticed today that she gained 5 lbs  2. LOCATION: What part of the leg is swollen?  Are both legs swollen or just one leg?     Bilateral, left a little moreso  3. SEVERITY: How bad is the swelling? (e.g., localized; mild, moderate, severe)     Pitting  4. REDNESS: Is there redness or signs of infection?     A little bit  5. PAIN: Is the swelling painful to touch? If Yes, ask: How painful is it?   (Scale 1-10; mild, moderate or severe)     Denies  7. CAUSE: What do you think is causing the leg swelling?     Fluid  8. MEDICAL HISTORY: Do you have a history of blood clots (e.g., DVT), cancer, heart failure, kidney disease, or liver failure?     Denies  9. RECURRENT SYMPTOM: Have you had leg swelling before? If Yes, ask: When was the last time? What happened that time?     Once in July 2025, tx with diuretics  10. OTHER SYMPTOMS: Do you have  any other symptoms? (e.g., chest pain, difficulty breathing)       Denies  Protocols used: Leg Swelling and Edema-A-AH Copied from CRM #[id number]. Topic: Clinical - Red Word Triage >> Apr 19, 2024  3:20 PM Fonda T wrote: [facility] that prompted transfer to Nurse Triage: Patient calling reports she is having increased swelling, left leg greater than right leg swelling. Patient also reports can hardly see her ankle on the left.

## 2024-04-20 ENCOUNTER — Other Ambulatory Visit
Admission: RE | Admit: 2024-04-20 | Discharge: 2024-04-20 | Disposition: A | Discharge status: Home / Self Care | Attending: Nurse Practitioner | Admitting: Nurse Practitioner

## 2024-04-20 ENCOUNTER — Ambulatory Visit (INDEPENDENT_AMBULATORY_CARE_PROVIDER_SITE_OTHER): Admitting: Nurse Practitioner

## 2024-04-20 ENCOUNTER — Ambulatory Visit: Payer: Self-pay | Admitting: Nurse Practitioner

## 2024-04-20 ENCOUNTER — Encounter: Payer: Self-pay | Admitting: Nurse Practitioner

## 2024-04-20 VITALS — BP 126/70 | HR 98 | Temp 98.2°F | Resp 18 | Ht <= 58 in | Wt 151.0 lb

## 2024-04-20 DIAGNOSIS — L03116 Cellulitis of left lower limb: Secondary | ICD-10-CM

## 2024-04-20 DIAGNOSIS — R600 Localized edema: Secondary | ICD-10-CM

## 2024-04-20 DIAGNOSIS — L739 Follicular disorder, unspecified: Secondary | ICD-10-CM | POA: Diagnosis not present

## 2024-04-20 DIAGNOSIS — R079 Chest pain, unspecified: Secondary | ICD-10-CM | POA: Diagnosis not present

## 2024-04-20 DIAGNOSIS — R9431 Abnormal electrocardiogram [ECG] [EKG]: Secondary | ICD-10-CM

## 2024-04-20 LAB — CBC WITH DIFFERENTIAL/PLATELET
Abs Immature Granulocytes: 0.03 K/uL (ref 0.00–0.07)
Basophils Absolute: 0 K/uL (ref 0.0–0.1)
Basophils Relative: 1 %
Eosinophils Absolute: 0.1 K/uL (ref 0.0–0.5)
Eosinophils Relative: 2 %
HCT: 37.7 % (ref 36.0–46.0)
Hemoglobin: 12 g/dL (ref 12.0–15.0)
Immature Granulocytes: 1 %
Lymphocytes Relative: 29 %
Lymphs Abs: 1.2 K/uL (ref 0.7–4.0)
MCH: 31.3 pg (ref 26.0–34.0)
MCHC: 31.8 g/dL (ref 30.0–36.0)
MCV: 98.2 fL (ref 80.0–100.0)
Monocytes Absolute: 0.3 K/uL (ref 0.1–1.0)
Monocytes Relative: 7 %
Neutro Abs: 2.5 K/uL (ref 1.7–7.7)
Neutrophils Relative %: 60 %
Platelets: 203 K/uL (ref 150–400)
RBC: 3.84 MIL/uL — ABNORMAL LOW (ref 3.87–5.11)
RDW: 13.2 % (ref 11.5–15.5)
WBC: 4.2 K/uL (ref 4.0–10.5)
nRBC: 0 % (ref 0.0–0.2)

## 2024-04-20 LAB — COMPREHENSIVE METABOLIC PANEL WITH GFR
ALT: <5 U/L (ref 0–44)
AST: 14 U/L — ABNORMAL LOW (ref 15–41)
Albumin: 3.2 g/dL — ABNORMAL LOW (ref 3.5–5.0)
Alkaline Phosphatase: 55 U/L (ref 38–126)
Anion gap: 8 (ref 5–15)
BUN: 10 mg/dL (ref 8–23)
CO2: 25 mmol/L (ref 22–32)
Calcium: 8.9 mg/dL (ref 8.9–10.3)
Chloride: 107 mmol/L (ref 98–111)
Creatinine, Ser: 0.93 mg/dL (ref 0.44–1.00)
GFR, Estimated: >60 mL/min (ref >60)
Glucose, Bld: 93 mg/dL (ref 70–99)
Potassium: 3.8 mmol/L (ref 3.5–5.1)
Sodium: 140 mmol/L (ref 135–145)
Total Bilirubin: 0.8 mg/dL (ref 0.0–1.2)
Total Protein: 5.9 g/dL — ABNORMAL LOW (ref 6.5–8.1)

## 2024-04-20 LAB — TROPONIN I (HIGH SENSITIVITY): Troponin I (High Sensitivity): 3 ng/L (ref <18)

## 2024-04-20 LAB — BRAIN NATRIURETIC PEPTIDE: B Natriuretic Peptide: 36.8 pg/mL (ref 0.0–100.0)

## 2024-04-20 MED ORDER — FUROSEMIDE 20 MG PO TABS: 20.0000 mg | ORAL_TABLET | Freq: Every day | ORAL | 0 refills | Status: DC

## 2024-04-20 MED ORDER — SULFAMETHOXAZOLE-TRIMETHOPRIM 800-160 MG PO TABS: 1.0000 | ORAL_TABLET | Freq: Two times a day (BID) | ORAL | 0 refills | Status: AC

## 2024-04-20 MED ORDER — MUPIROCIN 2 % EX OINT: TOPICAL_OINTMENT | CUTANEOUS | 0 refills | Status: DC

## 2024-04-20 NOTE — Progress Notes (Addendum)
 BP 126/70   Pulse 98   Temp 98.2 F (36.8 C)   Resp 18   Ht 4' 9 (1.448 m)   Wt 151 lb (68.5 kg)   SpO2 93%   BMI 32.68 kg/m    Subjective:    Patient ID: Sara Andrews, female    DOB: 06/07/1956, 68 y.o.   MRN: [id number]  HPI: Sara Andrews is a 68 y.o. female  Chief Complaint  Patient presents with   Edema    Bilateral legs worse at end of day and some weight gain.  Has skin tear left leg   Nose Problem    Recurrent sores inside of nose   Discussed the use of AI scribe software for clinical note transcription with the patient, who gave verbal consent to proceed.  History of Present Illness Sara Andrews is a 68 year old female with Parkinson's disease who presents with sores inside her nose, bilateral leg swelling, and a skin tear on her left leg.  Nasal ulcerations and congestion - Painful sores inside the nose for approximately two weeks - Associated nasal congestion - No recent illness - Uses Vaseline to keep the area moist  Bilateral lower extremity edema - Bilateral leg swelling, worsens by the end of the day - Slight improvement with leg elevation, particularly at night -denies any calf pain, concerned that she has had some weight gain - History of similar swelling episodes, including a significant episode in August after returning from the beach - Prescribed a diuretic ('water pill') at urgent care for previous episode; unsure of specific medication  Left lower extremity skin tear - Skin tear on the left leg occurred while trying to prevent a fall when picking up an item for her granddaughter - Injury preceded the current episode of swelling - Applies ointment to prevent infection  Recent weight gain - Recent weight gain noted - Discrepancy between home scale and clinic scale - Attributes weight gain to fluid  Chest pain - Chest pain described as 'tightness and burning' lasting about 20 minutes, occurred last Thursday - No current  chest pain - History of similar episodes - No known diagnosis of heart problems - Last EKG performed in April               03/22/2024    8:54 AM 11/17/2023    9:25 AM 07/16/2023   10:13 AM  Depression screen PHQ 2/9  Decreased Interest 1 1 1   Down, Depressed, Hopeless 1 1 1   PHQ - 2 Score 2 2 2   Altered sleeping 1 1 1   Tired, decreased energy 1 1 1   Change in appetite 0 0 0  Feeling bad or failure about yourself  0 0 0  Trouble concentrating 1 1 1   Moving slowly or fidgety/restless 0 0 0  Suicidal thoughts 0 0 0  PHQ-9 Score 5 5 5   Difficult doing work/chores Somewhat difficult Somewhat difficult Somewhat difficult    Relevant past medical, surgical, family and social history reviewed and updated as indicated. Interim medical history since our last visit reviewed. Allergies and medications reviewed and updated.  Review of Systems  Ten systems reviewed and is negative except as mentioned in HPI      Objective:      BP 126/70   Pulse 98   Temp 98.2 F (36.8 C)   Resp 18   Ht 4' 9 (1.448 m)   Wt 151 lb (68.5 kg)   SpO2 93%  BMI 32.68 kg/m    Wt Readings from Last 3 Encounters:  04/20/24 151 lb (68.5 kg)  03/22/24 150 lb 11.2 oz (68.4 kg)  01/04/24 151 lb (68.5 kg)    Physical Exam MEASUREMENTS: Weight- 151. GENERAL: Alert, cooperative, well developed, no acute distress. HEENT: Normocephalic, normal oropharynx, moist mucous membranes, sore in left nostril CHEST: Clear to auscultation bilaterally, no wheezes, rhonchi, or crackles. CARDIOVASCULAR: Normal heart rate and rhythm, S1 and S2 normal without murmurs. ABDOMEN: Soft, non-tender, non-distended, without organomegaly, normal bowel sounds. EXTREMITIES: No cyanosis, mild lower extremity edema. Left lower leg skin tear with redness and drainage NEUROLOGICAL: Cranial nerves grossly intact, moves all extremities without gross motor or sensory deficit.  Results for orders placed or performed in visit on  03/22/24  POCT glycosylated hemoglobin (Hb A1C)   Collection Time: 03/22/24  9:05 AM  Result Value Ref Range   Hemoglobin A1C 5.6 4.0 - 5.6 %   HbA1c POC (<> result, manual entry)     HbA1c, POC (prediabetic range)     HbA1c, POC (controlled diabetic range)            Assessment & Plan:   Problem List Items Addressed This Visit   None Visit Diagnoses       Nose folliculitis    -  Primary   Relevant Medications   mupirocin  ointment (BACTROBAN ) 2 %     Cellulitis of left lower extremity       Relevant Medications   mupirocin  ointment (BACTROBAN ) 2 %   sulfamethoxazole -trimethoprim  (BACTRIM  DS) 800-160 MG tablet   Other Relevant Orders   Wound culture   Brain natriuretic peptide   CBC with Differential/Platelet   Comprehensive metabolic panel with GFR     Lower extremity edema       Relevant Medications   furosemide  (LASIX ) 20 MG tablet   Other Relevant Orders   EKG 12-Lead   Brain natriuretic peptide   CBC with Differential/Platelet   Comprehensive metabolic panel with GFR     Chest pain, unspecified type       Relevant Orders   EKG 12-Lead   Troponin I (High Sensitivity)        Assessment and Plan Assessment & Plan Left lower leg cellulitis with skin tear Acute left lower leg cellulitis with a skin tear, likely secondary to trauma from a bar. The wound appears infected, contributing to swelling.  - Obtain wound culture from the left lower leg - Prescribe antibiotic for the infection - Take a picture of the wound for the medical chart  Bilateral lower extremity edema Bilateral lower extremity edema, worse at the end of the day and slightly improved with leg elevation. The edema may be related to the infection in the left leg, but heart failure is also considered as a differential diagnosis. - Order BNP blood test to evaluate for heart failure - Prescribe diuretic for edema management  Nasal folliculitis Nasal folliculitis present for approximately two  weeks, causing discomfort and nasal congestion. No recent illness reported. - Prescribe mupirocin  ointment to apply intranasally three times a day - Advise to continue using Vaseline to keep the area moist  Chest pain with new first degree heart block Intermittent chest pain described as tightness and burning, lasting about 20 minutes. Recent EKG shows new first degree heart block with slight ST elevation and widening QRS, which is different from the previous EKG. The chest pain and EKG changes raise concern for potential cardiac issues. - Perform EKG to  assess current cardiac status - Order stat labs including troponin - If troponin is elevated, refer to emergency department - If troponin is negative, place urgent referral to cardiology        Follow up plan: Return if symptoms worsen or fail to improve.

## 2024-04-21 NOTE — Telephone Encounter (Signed)
 Copied from CRM [id number]. Topic: Clinical - Lab/Test Results >> Apr 21, 2024  3:01 PM Rosaria E wrote: Reason for CRM: Pt has a question about her lab results

## 2024-04-23 LAB — WOUND CULTURE
MICRO NUMBER:: 17016285
SPECIMEN QUALITY:: ADEQUATE

## 2024-05-02 NOTE — Telephone Encounter (Unsigned)
 Copied from CRM [id number]. Topic: Referral - Status >> May 02, 2024  2:17 PM Joesph B wrote: Reason for CRM: patient is calling about a referral for a cardiologist, the status of the referral has been closed. She has been waiting on someone to call her to schedule.

## 2024-05-03 NOTE — Telephone Encounter (Signed)
 Pt is willing to see cardiology, Thanks [place] sorry for the extra work.

## 2024-05-08 ENCOUNTER — Other Ambulatory Visit: Payer: Self-pay | Admitting: Family Medicine

## 2024-05-08 DIAGNOSIS — E038 Other specified hypothyroidism: Secondary | ICD-10-CM

## 2024-05-12 ENCOUNTER — Other Ambulatory Visit: Payer: Self-pay | Admitting: Nurse Practitioner

## 2024-05-12 DIAGNOSIS — R600 Localized edema: Secondary | ICD-10-CM

## 2024-05-13 NOTE — Telephone Encounter (Signed)
 Requested Prescriptions  Pending Prescriptions Disp Refills   furosemide  (LASIX ) 20 MG tablet [Pharmacy Med Name: FUROSEMIDE  20 MG TABLET] 90 tablet 1    Sig: TAKE 1 TABLET BY MOUTH EVERY DAY     Cardiovascular:  Diuretics - Loop Failed - 05/13/2024  4:13 PM      Failed - Mg Level in normal range and within 180 days    Magnesium   Date Value Ref Range Status  12/11/2020 2.3 1.5 - 2.5 mg/dL Final         Passed - K in normal range and within 180 days    Potassium  Date Value Ref Range Status  04/20/2024 3.8 3.5 - 5.1 mmol/L Final         Passed - Ca in normal range and within 180 days    Calcium   Date Value Ref Range Status  04/20/2024 8.9 8.9 - 10.3 mg/dL Final         Passed - Na in normal range and within 180 days    Sodium  Date Value Ref Range Status  04/20/2024 140 135 - 145 mmol/L Final  08/30/2019 143 134 - 144 mmol/L Final         Passed - Cr in normal range and within 180 days    Creat  Date Value Ref Range Status  08/14/2023 1.00 0.50 - 1.05 mg/dL Final   Creatinine, Ser  Date Value Ref Range Status  04/20/2024 0.93 0.44 - 1.00 mg/dL Final   Creatinine, Urine  Date Value Ref Range Status  08/14/2023 28 20 - 275 mg/dL Final         Passed - Cl in normal range and within 180 days    Chloride  Date Value Ref Range Status  04/20/2024 107 98 - 111 mmol/L Final         Passed - Last BP in normal range    BP Readings from Last 1 Encounters:  04/20/24 126/70         Passed - Valid encounter within last 6 months    Recent Outpatient Visits           3 weeks ago Nose folliculitis   [facility] Gareth Mliss FALCON, FNP   1 month ago Asthma-COPD overlap syndrome [facility])   [facility] [place] Glenard Mire, MD   5 months ago Thoracic aorta atherosclerosis   [facility] Glenard Mire, MD   6 months ago COPD exacerbation [facility])   [facility] Bernardo Fend, DO       Future Appointments             In 2 months Sowles, Krichna, MD [facility] [place]

## 2024-05-26 ENCOUNTER — Other Ambulatory Visit: Payer: Self-pay | Admitting: Family Medicine

## 2024-05-26 ENCOUNTER — Other Ambulatory Visit: Payer: Self-pay | Admitting: Nurse Practitioner

## 2024-05-26 ENCOUNTER — Encounter: Payer: Self-pay | Admitting: Family Medicine

## 2024-05-26 DIAGNOSIS — L739 Follicular disorder, unspecified: Secondary | ICD-10-CM

## 2024-05-26 DIAGNOSIS — F331 Major depressive disorder, recurrent, moderate: Secondary | ICD-10-CM

## 2024-05-26 DIAGNOSIS — J441 Chronic obstructive pulmonary disease with (acute) exacerbation: Secondary | ICD-10-CM

## 2024-05-26 DIAGNOSIS — L03116 Cellulitis of left lower limb: Secondary | ICD-10-CM

## 2024-05-26 DIAGNOSIS — Z1231 Encounter for screening mammogram for malignant neoplasm of breast: Secondary | ICD-10-CM

## 2024-05-27 NOTE — Telephone Encounter (Signed)
 Requested medications are due for refill today.  yes  Requested medications are on the active medications list.  yes  Last refill. 04/20/2024 30g 0 rf  Future visit scheduled.   yes  Notes to clinic.  Medication not assigned to a protocol. Please review for refill.    Requested Prescriptions  Pending Prescriptions Disp Refills   mupirocin  ointment (BACTROBAN ) 2 % [Pharmacy Med Name: MUPIROCIN  2% OINTMENT] 22 g     Sig: APPLY TO AFFECTED AREA 3 TIMES A DAY FOR 7 DAYS     Off-Protocol Failed - 05/27/2024  4:50 PM      Failed - Medication not assigned to a protocol, review manually.      Passed - Valid encounter within last 12 months    Recent Outpatient Visits           1 month ago Nose folliculitis   [facility] Gareth Clarity F, FNP   2 months ago Asthma-COPD overlap syndrome [facility])   [facility] Glenard Mire, MD   6 months ago Thoracic aorta atherosclerosis   [facility] Glenard Mire, MD   6 months ago COPD exacerbation [facility])   [facility] Bernardo Fend, DO       Future Appointments             In 2 months Glenard, Krichna, MD [facility] [place]

## 2024-06-27 ENCOUNTER — Other Ambulatory Visit: Payer: Self-pay | Admitting: Nurse Practitioner

## 2024-06-27 ENCOUNTER — Other Ambulatory Visit: Payer: Self-pay | Admitting: Internal Medicine

## 2024-06-27 ENCOUNTER — Other Ambulatory Visit: Payer: Self-pay | Admitting: Cardiology

## 2024-06-27 DIAGNOSIS — L03116 Cellulitis of left lower limb: Secondary | ICD-10-CM

## 2024-06-27 DIAGNOSIS — J441 Chronic obstructive pulmonary disease with (acute) exacerbation: Secondary | ICD-10-CM

## 2024-06-30 NOTE — Telephone Encounter (Signed)
 Requested medication (s) are due for refill today: yes  Requested medication (s) are on the active medication list: yes  Last refill:  11/04/23  Future visit scheduled: yes  Notes to clinic:  Unable to refill per protocol, cannot delegate.      Requested Prescriptions  Pending Prescriptions Disp Refills   predniSONE  (DELTASONE ) 20 MG tablet [Pharmacy Med Name: PREDNISONE  20 MG TABLET] 10 tablet 0    Sig: TAKE 2 TABLETS BY MOUTH EVERY DAY FOR 5 DAYS     Not Delegated - Endocrinology:  Oral Corticosteroids Failed - 06/30/2024  1:36 PM      Failed - This refill cannot be delegated      Failed - Manual Review: Eye exam for IOP if prolonged treatment      Failed - Bone Mineral Density or Dexa Scan completed in the last 2 years      Passed - Glucose (serum) in normal range and within 180 days    Glucose  Date Value Ref Range Status  01/21/2017 109  Final   Glucose, Bld  Date Value Ref Range Status  04/20/2024 93 70 - 99 mg/dL Final    Comment:    Glucose reference range applies only to samples taken after fasting for at least 8 hours.   Glucose-Capillary  Date Value Ref Range Status  05/07/2023 85 70 - 99 mg/dL Final    Comment:    Glucose reference range applies only to samples taken after fasting for at least 8 hours.         Passed - K in normal range and within 180 days    Potassium  Date Value Ref Range Status  04/20/2024 3.8 3.5 - 5.1 mmol/L Final         Passed - Na in normal range and within 180 days    Sodium  Date Value Ref Range Status  04/20/2024 140 135 - 145 mmol/L Final  08/30/2019 143 134 - 144 mmol/L Final         Passed - Last BP in normal range    BP Readings from Last 1 Encounters:  04/20/24 126/70         Passed - Valid encounter within last 6 months    Recent Outpatient Visits           2 months ago Nose folliculitis   [facility] Gareth Mliss FALCON, FNP   3 months ago Asthma-COPD overlap syndrome [facility])   [facility] Glenard Mire, MD   7 months ago Thoracic aorta atherosclerosis   [facility] Glenard Mire, MD   7 months ago COPD exacerbation [facility])   [facility] Bernardo Fend, DO       Future Appointments             In 1 month Glenard, Krichna, MD [facility], [place]

## 2024-06-30 NOTE — Telephone Encounter (Signed)
 Requested medication (s) are due for refill today: yes  Requested medication (s) are on the active medication list: yes  Last refill:  04/20/24  Future visit scheduled: yes  Notes to clinic:    Medication not assigned to a protocol, review manually.     Requested Prescriptions  Pending Prescriptions Disp Refills   sulfamethoxazole -trimethoprim  (BACTRIM  DS) 800-160 MG tablet [Pharmacy Med Name: SULFAMETHOXAZOLE -TMP DS TABLET] 14 tablet 0    Sig: TAKE 1 TABLET BY MOUTH TWICE A DAY FOR 7 DAYS     Off-Protocol Failed - 06/30/2024  1:37 PM      Failed - Medication not assigned to a protocol, review manually.      Passed - Valid encounter within last 12 months    Recent Outpatient Visits           2 months ago Nose folliculitis   [facility] Gareth Clarity F, FNP   3 months ago Asthma-COPD overlap syndrome [facility])   [facility] Glenard Mire, MD   7 months ago Thoracic aorta atherosclerosis   [facility] Glenard Mire, MD   7 months ago COPD exacerbation [facility])   [facility] Bernardo Fend, DO       Future Appointments             In 1 month Glenard, Krichna, MD [facility], [place]

## 2024-07-01 ENCOUNTER — Encounter

## 2024-07-02 DIAGNOSIS — H1131 Conjunctival hemorrhage, right eye: Secondary | ICD-10-CM | POA: Diagnosis not present

## 2024-07-02 DIAGNOSIS — J449 Chronic obstructive pulmonary disease, unspecified: Secondary | ICD-10-CM | POA: Diagnosis not present

## 2024-07-07 ENCOUNTER — Other Ambulatory Visit: Payer: Self-pay | Admitting: Family Medicine

## 2024-07-07 DIAGNOSIS — J441 Chronic obstructive pulmonary disease with (acute) exacerbation: Secondary | ICD-10-CM

## 2024-07-08 ENCOUNTER — Encounter

## 2024-07-08 NOTE — Telephone Encounter (Signed)
 Requested Prescriptions  Pending Prescriptions Disp Refills   albuterol  (VENTOLIN  HFA) 108 (90 Base) MCG/ACT inhaler [Pharmacy Med Name: ALBUTEROL  HFA (VENTOLIN ) INH] 18 each 0    Sig: INHALE 2 PUFFS INTO LUNGS EVERY 6 HOURS AS NEEDED FOR WHEEZING OR SHORTNESS OF BREATH     Pulmonology:  Beta Agonists 2 Passed - 07/08/2024  4:17 PM      Passed - Last BP in normal range    BP Readings from Last 1 Encounters:  04/20/24 126/70         Passed - Last Heart Rate in normal range    Pulse Readings from Last 1 Encounters:  04/20/24 98         Passed - Valid encounter within last 12 months    Recent Outpatient Visits           2 months ago Nose folliculitis   [facility] Gareth Mliss FALCON, FNP   3 months ago Asthma-COPD overlap syndrome [facility])   [facility] Glenard Mire, MD   7 months ago Thoracic aorta atherosclerosis   [facility] Glenard Mire, MD   8 months ago COPD exacerbation [facility])   [facility] Bernardo Fend, DO       Future Appointments             In 3 weeks Sowles, Krichna, MD [facility] [place]

## 2024-07-13 DIAGNOSIS — R4189 Other symptoms and signs involving cognitive functions and awareness: Secondary | ICD-10-CM | POA: Diagnosis not present

## 2024-07-13 DIAGNOSIS — F32A Depression, unspecified: Secondary | ICD-10-CM | POA: Diagnosis not present

## 2024-07-13 DIAGNOSIS — G20A1 Parkinson's disease without dyskinesia, without mention of fluctuations: Secondary | ICD-10-CM | POA: Diagnosis not present

## 2024-07-17 DIAGNOSIS — B9689 Other specified bacterial agents as the cause of diseases classified elsewhere: Secondary | ICD-10-CM | POA: Diagnosis not present

## 2024-07-17 DIAGNOSIS — J40 Bronchitis, not specified as acute or chronic: Secondary | ICD-10-CM | POA: Diagnosis not present

## 2024-07-17 DIAGNOSIS — J329 Chronic sinusitis, unspecified: Secondary | ICD-10-CM | POA: Diagnosis not present

## 2024-07-17 DIAGNOSIS — Z03818 Encounter for observation for suspected exposure to other biological agents ruled out: Secondary | ICD-10-CM | POA: Diagnosis not present

## 2024-07-26 ENCOUNTER — Ambulatory Visit (INDEPENDENT_AMBULATORY_CARE_PROVIDER_SITE_OTHER)

## 2024-07-26 VITALS — Ht 59.0 in | Wt 148.0 lb

## 2024-07-26 DIAGNOSIS — Z0000 Encounter for general adult medical examination without abnormal findings: Secondary | ICD-10-CM | POA: Diagnosis not present

## 2024-07-26 NOTE — Patient Instructions (Signed)
 Sara Andrews,  Thank you for taking the time for your Medicare Wellness Visit. I appreciate your continued commitment to your health goals. Please review the care plan we discussed, and feel free to reach out if I can assist you further.  Please note that Annual Wellness Visits do not include a physical exam. Some assessments may be limited, especially if the visit was conducted virtually. If needed, we may recommend an in-person follow-up with your provider.  Ongoing Care Seeing your primary care provider every 3 to 6 months helps us  monitor your health and provide consistent, personalized care.   Referrals If a referral was made during today's visit and you haven't received any updates within two weeks, please contact the referred provider directly to check on the status.  Recommended Screenings:  Health Maintenance  Topic Date Due   Medicare Annual Wellness Visit  07/18/2023   COVID-19 Vaccine (3 - 2025-26 season) 03/28/2024   Eye exam for diabetics  07/19/2024   Yearly kidney health urinalysis for diabetes  08/17/2024   Cologuard (Stool DNA test)  08/10/2024   Hemoglobin A1C  09/22/2024   Complete foot exam   11/16/2024   Breast Cancer Screening  04/07/2025   Yearly kidney function blood test for diabetes  04/20/2025   DTaP/Tdap/Td vaccine (3 - Td or Tdap) 04/29/2027   Pneumococcal Vaccine for age over 65  Completed   Flu Shot  Completed   Osteoporosis screening with Bone Density Scan  Completed   Hepatitis C Screening  Completed   Zoster (Shingles) Vaccine  Completed   Meningitis B Vaccine  Aged Out   Screening for Lung Cancer  Discontinued       07/26/2024    9:43 AM  Advanced Directives  Does Patient Have a Medical Advance Directive? No  Would patient like information on creating a medical advance directive? No - Patient declined    Vision: Annual vision screenings are recommended for early detection of glaucoma, cataracts, and diabetic retinopathy. These exams can  also reveal signs of chronic conditions such as diabetes and high blood pressure.  Dental: Annual dental screenings help detect early signs of oral cancer, gum disease, and other conditions linked to overall health, including heart disease and diabetes.  Please see the attached documents for additional preventive care recommendations.

## 2024-07-26 NOTE — Progress Notes (Signed)
 "  Chief Complaint  Patient presents with   Medicare Wellness     Subjective:   Sara Andrews is a 68 y.o. female who presents for a Medicare Annual Wellness Visit.  Visit info / Clinical Intake: Medicare Wellness Visit Type:: Subsequent Annual Wellness Visit Persons participating in visit and providing information:: patient Medicare Wellness Visit Mode:: Telephone If telephone:: video error Since this visit was completed virtually, some vitals may be partially provided or unavailable. Missing vitals are due to the limitations of the virtual format.: Documented vitals are patient reported If Telephone or Video please confirm:: I connected with patient using audio/video enable telemedicine. I verified patient identity with two identifiers, discussed telehealth limitations, and patient agreed to proceed. Patient Location:: home Provider Location:: office Interpreter Needed?: No Pre-visit prep was completed: yes AWV questionnaire completed by patient prior to visit?: no Living arrangements:: with family/others Patient's Overall Health Status Rating: (!) fair Typical amount of pain: some Does pain affect daily life?: (!) yes Are you currently prescribed opioids?: no  Dietary Habits and Nutritional Risks How many meals a day?: 2 Eats fruit and vegetables daily?: (!) no Most meals are obtained by: preparing own meals In the last 2 weeks, have you had any of the following?: none Diabetic:: (!) yes Any non-healing wounds?: no How often do you check your BS?: 0 Would you like to be referred to a Nutritionist or for Diabetic Management? : no  Functional Status Activities of Daily Living (to include ambulation/medication): Independent Ambulation: Independent Medication Administration: Independent Home Management (perform basic housework or laundry): Independent Manage your own finances?: yes Primary transportation is: driving Concerns about vision?: no *vision screening is  required for WTM* Concerns about hearing?: no  Fall Screening Falls in the past year?: 0 Number of falls in past year: 0 Was there an injury with Fall?: 0 Fall Risk Category Calculator: 0 Patient Fall Risk Level: Low Fall Risk  Fall Risk Patient at Risk for Falls Due to: Medication side effect Fall risk Follow up: Falls prevention discussed; Falls evaluation completed  Home and Transportation Safety: All rugs have non-skid backing?: yes All stairs or steps have railings?: yes Grab bars in the bathtub or shower?: yes Have non-skid surface in bathtub or shower?: yes Good home lighting?: yes Regular seat belt use?: yes Hospital stays in the last year:: no  Cognitive Assessment Difficulty concentrating, remembering, or making decisions? : yes Will 6CIT or Mini Cog be Completed: yes What year is it?: 0 points What month is it?: 0 points Give patient an address phrase to remember (5 components): [address] About what time is it?: 0 points Count backwards from 20 to 1: 0 points Say the months of the year in reverse: 4 points Repeat the address phrase from earlier: 4 points 6 CIT Score: 8 points  Advance Directives (For Healthcare) Does Patient Have a Medical Advance Directive?: No Would patient like information on creating a medical advance directive?: No - Patient declined  Reviewed/Updated  Reviewed/Updated: Reviewed All (Medical, Surgical, Family, Medications, Allergies, Care Teams, Patient Goals)    Allergies (verified) Patient has no known allergies.   Current Medications (verified) Outpatient Encounter Medications as of 07/26/2024  Medication Sig   albuterol  (VENTOLIN  HFA) 108 (90 Base) MCG/ACT inhaler INHALE 2 PUFFS INTO LUNGS EVERY 6 HOURS AS NEEDED FOR WHEEZING OR SHORTNESS OF BREATH   Blood Glucose Monitoring Suppl DEVI 1 each by Does not apply route in the morning, at noon, and at bedtime.  May substitute to any manufacturer covered by patient's  insurance.   buPROPion  (WELLBUTRIN  XL) 150 MG 24 hr tablet TAKE 1 TABLET(150 MG) BY MOUTH DAILY   carbidopa-levodopa (SINEMET IR) 25-100 MG tablet Take 1.5 tablets by mouth 3 (three) times daily.   cariprazine  (VRAYLAR ) 1.5 MG capsule Take 1 capsule (1.5 mg total) by mouth daily.   celecoxib (CELEBREX) 200 MG capsule Take 200 mg by mouth.   Cholecalciferol (VITAMIN D  PO) Take 1,000 Units by mouth daily. 1000 IU   clobetasol  ointment (TEMOVATE ) 0.05 % Apply 1 Application topically 2 (two) times daily.   cyanocobalamin  (VITAMIN B12) 1000 MCG tablet Take 1,000 mcg by mouth daily.   DULoxetine  (CYMBALTA ) 60 MG capsule Take 1 capsule (60 mg total) by mouth every morning.   fluticasone  (FLONASE ) 50 MCG/ACT nasal spray SPRAY 2 SPRAYS INTO EACH NOSTRIL EVERY DAY   Fluticasone -Umeclidin-Vilant (TRELEGY ELLIPTA ) 100-62.5-25 MCG/ACT AEPB Inhale 1 puff into the lungs daily.   furosemide  (LASIX ) 20 MG tablet TAKE 1 TABLET BY MOUTH EVERY DAY   gabapentin  (NEURONTIN ) 300 MG capsule Take 300 mg by mouth at bedtime.   Glucose Blood (BLOOD GLUCOSE TEST STRIPS) STRP 1 each by In Vitro route in the morning, at noon, and at bedtime. May substitute to any manufacturer covered by patient's insurance.   Lancet Device MISC 1 each by Does not apply route in the morning, at noon, and at bedtime. May substitute to any manufacturer covered by patient's insurance.   Lancets Misc. (ACCU-CHEK FASTCLIX LANCET) KIT    levocetirizine (XYZAL ) 5 MG tablet Take 1 tablet (5 mg total) by mouth every evening.   levothyroxine  (SYNTHROID ) 75 MCG tablet Take 1 tablet (75 mcg total) by mouth daily before breakfast.   linaclotide  (LINZESS ) 145 MCG CAPS capsule TAKE 1 CAPSULE BY MOUTH DAILY BEFORE BREAKFAST.   Melatonin 10 MG TABS Take 10 mg by mouth at bedtime.   montelukast  (SINGULAIR ) 10 MG tablet TAKE 1 TABLET BY MOUTH EVERYDAY AT BEDTIME   mupirocin  ointment (BACTROBAN ) 2 % Apply to affected area TID for 7 days.   omeprazole  (PRILOSEC)  20 MG capsule Take 1 capsule (20 mg total) by mouth daily.   rizatriptan (MAXALT) 10 MG tablet TAKE 1 TABLET (10 MG TOTAL) BY MOUTH DAILY AS NEEDED FOR MIGRAINE. NO MORE THAN 2 IN 48 HOURS   rosuvastatin  (CRESTOR ) 20 MG tablet TAKE 1 TABLET(20 MG) BY MOUTH DAILY   traZODone  (DESYREL ) 100 MG tablet TAKE 1 TABLET(100 MG) BY MOUTH AT BEDTIME   valACYclovir  (VALTREX ) 500 MG tablet TAKE 1 TABLET (500 MG TOTAL) BY MOUTH DAILY.   No facility-administered encounter medications on file as of 07/26/2024.    History: Past Medical History:  Diagnosis Date   Anxiety    Arthritis    hips, hands, back (03/10/2016)   Asthma    Chronic bronchitis (HCC)    COPD (chronic obstructive pulmonary disease) (HCC)    Cough    Depression    Esophagitis, reflux    GERD (gastroesophageal reflux disease)    Hyperlipidemia    Hypothyroidism    IBS (irritable bowel syndrome)    Lumbago    Muscle pain    Osteoarthritis    Sinus disorder    Sleep apnea    Thyroid  disease    Type 2 diabetes, diet controlled (HCC)    Uncomplicated herpes simplex    Past Surgical History:  Procedure Laterality Date   ABDOMINAL HYSTERECTOMY     CARPAL TUNNEL RELEASE Right  COLON SURGERY     COLONOSCOPY WITH PROPOFOL  N/A 05/07/2023   Procedure: COLONOSCOPY WITH PROPOFOL ;  Surgeon: Unk Corinn Skiff, MD;  Location: [facility] ENDOSCOPY;  Service: Gastroenterology;  Laterality: N/A;   ESOPHAGOGASTRODUODENOSCOPY (EGD) WITH PROPOFOL  N/A 10/24/2022   Procedure: ESOPHAGOGASTRODUODENOSCOPY (EGD) WITH PROPOFOL ;  Surgeon: Unk Corinn Skiff, MD;  Location: [facility] ENDOSCOPY;  Service: Gastroenterology;  Laterality: N/A;   FRACTURE SURGERY     HEMOSTASIS CLIP PLACEMENT  05/07/2023   Procedure: HEMOSTASIS CLIP PLACEMENT;  Surgeon: Unk Corinn Skiff, MD;  Location: [facility] ENDOSCOPY;  Service: Gastroenterology;;   LUMBAR LAMINECTOMY  03/16/2018   L4-5 Laminectomy & Fusion w/ Pedicle Screws, TLIF & Allograft; Surgeon: Royden Elsie Schneider, MD;  Location: [facility] MAIN OR; Service: Orthopedics; Laterality: N/A; Prone, ProAxis, Stim Neuromonitoring, O-Arm, Cell Saver, [employer], [employer], Justin, PACS on [facility]     OOPHORECTOMY Bilateral    ORIF TIBIA PLATEAU Left 03/10/2016   Procedure: OPEN REDUCTION INTERNAL FIXATION (ORIF) BICONDYLAR TIBIAL PLATEAU FRACTURE;  Surgeon: Oneil JAYSON Herald, MD;  Location: [facility] OR;  Service: Orthopedics;  Laterality: Left;   POLYPECTOMY  05/07/2023   Procedure: POLYPECTOMY;  Surgeon: Unk Corinn Skiff, MD;  Location: [facility] ENDOSCOPY;  Service: Gastroenterology;;   SHOULDER ARTHROSCOPY W/ ROTATOR CUFF REPAIR Right    SHOULDER ARTHROSCOPY WITH SUBACROMIAL DECOMPRESSION, ROTATOR CUFF REPAIR AND BICEP TENDON REPAIR Left 05/21/2021   Procedure: SHOULDER ARTHROSCOPY WITH DEBRIDEMENT, DECOMPRESSION, REPAIR OF PARTIAL THICKNESS ROTATOR CUFF REPAIR, BICEPS TENODESIS;  Surgeon: Edie Norleen PARAS, MD;  Location: [facility] ORS;  Service: Orthopedics;  Laterality: Left;   SUBMUCOSAL LIFTING INJECTION  05/07/2023   Procedure: SUBMUCOSAL LIFTING INJECTION;  Surgeon: Unk Corinn Skiff, MD;  Location: [facility] ENDOSCOPY;  Service: Gastroenterology;;   TONSILLECTOMY AND ADENOIDECTOMY     Family History  Problem Relation Age of Onset   Diabetes Father    Heart disease Father    Depression Sister    Alcohol abuse Sister    Anxiety disorder Sister    Bipolar disorder Sister    Pneumonia Sister    Breast cancer Maternal Aunt 12   Breast cancer Paternal Aunt 33   Hypertension Daughter    Breast cancer Daughter    Skin cancer Daughter    Heart attack Daughter    Anxiety disorder Daughter    Social History   Occupational History   Occupation: disabled    Comment: retired  Tobacco Use   Smoking status: Former    Current packs/day: 0.00    Average packs/day: 2.0 packs/day for 30.0 years (60.0 ttl pk-yrs)    Types: Cigarettes    Start date: 09/03/1978    Quit date: 09/03/2008    Years since quitting: 15.9   Smokeless tobacco: Never   Vaping Use   Vaping status: Never Used  Substance and Sexual Activity   Alcohol use: No   Drug use: No   Sexual activity: Never   Tobacco Counseling Counseling given: Not Answered  SDOH Screenings   Food Insecurity: No Food Insecurity (07/26/2024)  Housing: Unknown (07/26/2024)  Transportation Needs: No Transportation Needs (07/26/2024)  Utilities: Not At Risk (07/26/2024)  Alcohol Screen: Low Risk (07/26/2024)  Depression (PHQ2-9): Low Risk (07/26/2024)  Financial Resource Strain: Low Risk (07/26/2024)  Physical Activity: Inactive (07/26/2024)  Social Connections: Socially Isolated (07/26/2024)  Stress: No Stress Concern Present (07/26/2024)  Tobacco Use: Medium Risk (07/26/2024)  Health Literacy: Adequate Health Literacy (07/26/2024)   See flowsheets for full screening details  Depression Screen PHQ 2 & 9 Depression Scale- Over the past 2  weeks, how often have you been bothered by any of the following problems? Little interest or pleasure in doing things: 0 Feeling down, depressed, or hopeless (PHQ Adolescent also includes...irritable): 0 PHQ-2 Total Score: 0 Trouble falling or staying asleep, or sleeping too much: 3 Feeling tired or having little energy: 0 Poor appetite or overeating (PHQ Adolescent also includes...weight loss): 0 Feeling bad about yourself - or that you are a failure or have let yourself or your family down: 0 Trouble concentrating on things, such as reading the newspaper or watching television (PHQ Adolescent also includes...like school work): 0 Moving or speaking so slowly that other people could have noticed. Or the opposite - being so fidgety or restless that you have been moving around a lot more than usual: 0 Thoughts that you would be better off dead, or of hurting yourself in some way: 0 PHQ-9 Total Score: 3 If you checked off any problems, how difficult have these problems made it for you to do your work, take care of things at home, or get  along with other people?: Not difficult at all  Depression Treatment Depression Interventions/Treatment : [id number] Score <4 Follow-up Not Indicated     Goals Addressed             This Visit's Progress    Patient Stated       07/26/2024, eat healthier             Objective:    Today's Vitals   07/26/24 0934  Weight: 148 lb (67.1 kg)  Height: 4' 11 (1.499 m)   Body mass index is 29.89 kg/m.  Hearing/Vision screen Hearing Screening - Comments:: Denies hearing issues Vision Screening - Comments:: Regular eye exams Immunizations and Health Maintenance Health Maintenance  Topic Date Due   COVID-19 Vaccine (3 - 2025-26 season) 03/28/2024   OPHTHALMOLOGY EXAM  07/19/2024   Diabetic kidney evaluation - Urine ACR  08/17/2024   Fecal DNA (Cologuard)  08/10/2024   HEMOGLOBIN A1C  09/22/2024   FOOT EXAM  11/16/2024   Mammogram  04/07/2025   Diabetic kidney evaluation - eGFR measurement  04/20/2025   Medicare Annual Wellness (AWV)  07/26/2025   DTaP/Tdap/Td (3 - Td or Tdap) 04/29/2027   Pneumococcal Vaccine: 50+ Years  Completed   Influenza Vaccine  Completed   Bone Density Scan  Completed   Hepatitis C Screening  Completed   Zoster Vaccines- Shingrix   Completed   Meningococcal B Vaccine  Aged Out   Lung Cancer Screening  Discontinued        Assessment/Plan:  This is a routine wellness examination for [place].  Patient Care Team: Sowles, Krichna, MD as PCP - General (Family Medicine) Cherilyn Debby CROME, MD as Consulting Physician (Endocrinology) Kip Lynwood Double, PA-C as Physician Assistant (Physician Assistant) Pa, [facility]) Darliss Rogue, MD as Consulting Physician (Cardiology) Maree Jannett POUR, MD as Consulting Physician (Neurology)  I have personally reviewed and noted the following in the patients chart:   Medical and social history Use of alcohol, tobacco or illicit drugs  Current medications and supplements including  opioid prescriptions. Functional ability and status Nutritional status Physical activity Advanced directives List of other physicians Hospitalizations, surgeries, and ER visits in previous 12 months Vitals Screenings to include cognitive, depression, and falls Referrals and appointments  No orders of the defined types were placed in this encounter.  In addition, I have reviewed and discussed with patient certain preventive protocols, quality metrics, and best practice recommendations. A written  personalized care plan for preventive services as well as general preventive health recommendations were provided to patient.   Ardella FORBES Dawn, LPN   [id number]   Return in 1 year (on 07/26/2025).  After Visit Summary: (MyChart) Due to this being a telephonic visit, the after visit summary with patients personalized plan was offered to patient via MyChart   Nurse Notes: Vaccines not given: covid declined today HM Addressed: Will make appointment for eyes  "

## 2024-08-01 ENCOUNTER — Encounter: Payer: Self-pay | Admitting: Internal Medicine

## 2024-08-01 ENCOUNTER — Ambulatory Visit (INDEPENDENT_AMBULATORY_CARE_PROVIDER_SITE_OTHER): Admitting: Family Medicine

## 2024-08-01 ENCOUNTER — Encounter: Payer: Self-pay | Admitting: Family Medicine

## 2024-08-01 VITALS — BP 110/70 | HR 87 | Resp 16 | Ht 59.0 in | Wt 148.4 lb

## 2024-08-01 DIAGNOSIS — J432 Centrilobular emphysema: Secondary | ICD-10-CM

## 2024-08-01 DIAGNOSIS — K219 Gastro-esophageal reflux disease without esophagitis: Secondary | ICD-10-CM

## 2024-08-01 DIAGNOSIS — G20A1 Parkinson's disease without dyskinesia, without mention of fluctuations: Secondary | ICD-10-CM

## 2024-08-01 DIAGNOSIS — I700 Atherosclerosis of aorta: Secondary | ICD-10-CM

## 2024-08-01 DIAGNOSIS — E038 Other specified hypothyroidism: Secondary | ICD-10-CM

## 2024-08-01 DIAGNOSIS — G43009 Migraine without aura, not intractable, without status migrainosus: Secondary | ICD-10-CM

## 2024-08-01 DIAGNOSIS — F5105 Insomnia due to other mental disorder: Secondary | ICD-10-CM

## 2024-08-01 DIAGNOSIS — E785 Hyperlipidemia, unspecified: Secondary | ICD-10-CM

## 2024-08-01 DIAGNOSIS — F331 Major depressive disorder, recurrent, moderate: Secondary | ICD-10-CM

## 2024-08-01 DIAGNOSIS — E1169 Type 2 diabetes mellitus with other specified complication: Secondary | ICD-10-CM

## 2024-08-01 DIAGNOSIS — J4489 Other specified chronic obstructive pulmonary disease: Secondary | ICD-10-CM

## 2024-08-01 DIAGNOSIS — M810 Age-related osteoporosis without current pathological fracture: Secondary | ICD-10-CM

## 2024-08-01 DIAGNOSIS — E538 Deficiency of other specified B group vitamins: Secondary | ICD-10-CM

## 2024-08-01 LAB — POCT GLYCOSYLATED HEMOGLOBIN (HGB A1C): Hemoglobin A1C: 5.7 % — AB (ref 4.0–5.6)

## 2024-08-01 MED ORDER — RIZATRIPTAN BENZOATE 10 MG PO TABS: 10.0000 mg | ORAL_TABLET | Freq: Every day | ORAL | 0 refills | Status: AC | PRN

## 2024-08-01 MED ORDER — BUPROPION HCL ER (XL) 150 MG PO TB24: ORAL_TABLET | ORAL | 1 refills | Status: AC

## 2024-08-01 MED ORDER — CARIPRAZINE HCL 1.5 MG PO CAPS: 1.5000 mg | ORAL_CAPSULE | Freq: Every day | ORAL | 1 refills | Status: AC

## 2024-08-01 MED ORDER — TRAZODONE HCL 100 MG PO TABS: ORAL_TABLET | ORAL | 1 refills | Status: AC

## 2024-08-01 MED ORDER — TRELEGY ELLIPTA 100-62.5-25 MCG/ACT IN AEPB: 1.0000 | INHALATION_SPRAY | Freq: Every day | RESPIRATORY_TRACT | 1 refills | Status: AC

## 2024-08-01 MED ORDER — LEVOCETIRIZINE DIHYDROCHLORIDE 5 MG PO TABS: 5.0000 mg | ORAL_TABLET | Freq: Every evening | ORAL | 1 refills | Status: AC

## 2024-08-01 MED ORDER — OMEPRAZOLE 20 MG PO CPDR: 20.0000 mg | DELAYED_RELEASE_CAPSULE | Freq: Every day | ORAL | 1 refills | Status: AC

## 2024-08-01 MED ORDER — ROSUVASTATIN CALCIUM 20 MG PO TABS: ORAL_TABLET | ORAL | 1 refills | Status: AC

## 2024-08-01 MED ORDER — DULOXETINE HCL 60 MG PO CPEP: 60.0000 mg | ORAL_CAPSULE | Freq: Every morning | ORAL | 1 refills | Status: AC

## 2024-08-01 MED ORDER — MONTELUKAST SODIUM 10 MG PO TABS: 10.0000 mg | ORAL_TABLET | Freq: Every day | ORAL | 1 refills | Status: AC

## 2024-08-01 NOTE — Progress Notes (Signed)
 Name: Sara Andrews   MRN: [id number]    DOB: 1955/11/23   Date:08/01/2024       Progress Note  Subjective  Chief Complaint  Chief Complaint  Patient presents with   Medical Management of Chronic Issues   Discussed the use of AI scribe software for clinical note transcription with the patient, who gave verbal consent to proceed.  History of Present Illness Sara Andrews is a 69 year old female with COPD and asthma who presents with a persistent cough.  She has experienced a persistent cough for over a year, which temporarily improves with medication but returns within two days. Her granddaughter noted the cough has been present for two years. The cough is both dry and productive, with thick phlegm that is difficult to expel. She experiences shortness of breath and has been using her inhaler more frequently. She is currently using Ventolin  and Trelegy (100/62.5/25 mcg) for her breathing issues.  She has a history of COPD and asthma, with emphysema noted on lung cancer screening. She takes Singulair  for asthma and allergies, omeprazole  for reflux, She also uses Xyzal  for allergies   She has type 2 diabetes, which is currently well-controlled with an A1c of 5.7. She is not on medication for diabetes but is monitored regularly. She reports increased urination and increased water intake.  She has a history of dyslipidemia and is taking rosuvastatin  20 mg. No muscle aches associated with this medication.  She has hypothyroidism and is taking levothyroxine  75 mcg. She reports constipation and dry skin, which may be related to her thyroid  condition.  She experiences migraines and uses Maxalt  as needed. She is not on any preventive medication for migraines.  She has age-related osteoporosis and receives an annual zoledronic  acid infusion. Her last infusion was in May, and she had a bone density test in March.  She has Parkinson's disease, experiencing tremors and balance issues. She  takes Sinemet IR (25/100 mg) and gabapentin  for pain. She also reports memory issues and is taking Exelon  1.5 mg.  She has major depression and anxiety, for which she takes Wellbutrin  150 mg XR, Vraylar  1.5 mg, and Cymbalta . She reports difficulty staying asleep, despite taking trazodone  for sleep.  She lives with her family, including grandchildren, and finds the environment noisy and sometimes stressful.   Patient Active Problem List   Diagnosis Date Noted   Adenomatous polyp of cecum 05/07/2023   CAD (coronary artery disease), native coronary artery 01/09/2023   Hiatal hernia 10/24/2022   Chronic GERD 10/24/2022   Spasms of the hands or feet 01/10/2022   Insomnia due to mental disorder 01/10/2022   Tendinitis of upper biceps tendon of left shoulder 05/22/2021   Rotator cuff tendinitis, left 05/22/2021   Nontraumatic incomplete tear of left rotator cuff 05/22/2021   History of shingles 03/23/2020   Burning sensation of lower extremity 03/22/2020   Centrilobular emphysema (HCC) 01/12/2020   Senile purpura 01/12/2020   Major depression in remission 01/12/2020   Dyslipidemia associated with type 2 diabetes mellitus (HCC) 01/12/2020   Bursitis of hip 08/23/2019   Carpal tunnel syndrome 08/23/2019   Thoracic aorta atherosclerosis 08/03/2019   Spondylosis of lumbar spine 11/06/2016   Spinal stenosis of lumbar region with neurogenic claudication 10/28/2016   Chronic bilateral low back pain with bilateral sciatica 09/16/2016   Moderate episode of recurrent major depressive disorder (HCC) 08/07/2016   Pain of left heel 06/02/2016   Degenerative cervical disc 03/04/2016   Primary osteoarthritis of right  hand 02/01/2016   Paresthesias in left hand 02/01/2016   History of smoking 10/05/2013   Adult onset hypothyroidism 10/05/2013    Past Surgical History:  Procedure Laterality Date   ABDOMINAL HYSTERECTOMY     CARPAL TUNNEL RELEASE Right    COLON SURGERY     COLONOSCOPY WITH  PROPOFOL  N/A 05/07/2023   Procedure: COLONOSCOPY WITH PROPOFOL ;  Surgeon: Unk Corinn Skiff, MD;  Location: [facility] ENDOSCOPY;  Service: Gastroenterology;  Laterality: N/A;   ESOPHAGOGASTRODUODENOSCOPY (EGD) WITH PROPOFOL  N/A 10/24/2022   Procedure: ESOPHAGOGASTRODUODENOSCOPY (EGD) WITH PROPOFOL ;  Surgeon: Unk Corinn Skiff, MD;  Location: [facility] ENDOSCOPY;  Service: Gastroenterology;  Laterality: N/A;   FRACTURE SURGERY     HEMOSTASIS CLIP PLACEMENT  05/07/2023   Procedure: HEMOSTASIS CLIP PLACEMENT;  Surgeon: Unk Corinn Skiff, MD;  Location: [facility] ENDOSCOPY;  Service: Gastroenterology;;   LUMBAR LAMINECTOMY  03/16/2018   L4-5 Laminectomy & Fusion w/ Pedicle Screws, TLIF & Allograft; Surgeon: Royden Elsie Schneider, MD; Location: [facility] MAIN OR; Service: Orthopedics; Laterality: N/A; Prone, ProAxis, Stim Neuromonitoring, O-Arm, Cell Saver, Bone Mill, [employer], Justin, PACS on [facility]     OOPHORECTOMY Bilateral    ORIF TIBIA PLATEAU Left 03/10/2016   Procedure: OPEN REDUCTION INTERNAL FIXATION (ORIF) BICONDYLAR TIBIAL PLATEAU FRACTURE;  Surgeon: Oneil JAYSON Herald, MD;  Location: [facility] OR;  Service: Orthopedics;  Laterality: Left;   POLYPECTOMY  05/07/2023   Procedure: POLYPECTOMY;  Surgeon: Unk Corinn Skiff, MD;  Location: [facility] ENDOSCOPY;  Service: Gastroenterology;;   SHOULDER ARTHROSCOPY W/ ROTATOR CUFF REPAIR Right    SHOULDER ARTHROSCOPY WITH SUBACROMIAL DECOMPRESSION, ROTATOR CUFF REPAIR AND BICEP TENDON REPAIR Left 05/21/2021   Procedure: SHOULDER ARTHROSCOPY WITH DEBRIDEMENT, DECOMPRESSION, REPAIR OF PARTIAL THICKNESS ROTATOR CUFF REPAIR, BICEPS TENODESIS;  Surgeon: Edie Norleen PARAS, MD;  Location: [facility] ORS;  Service: Orthopedics;  Laterality: Left;   SUBMUCOSAL LIFTING INJECTION  05/07/2023   Procedure: SUBMUCOSAL LIFTING INJECTION;  Surgeon: Unk Corinn Skiff, MD;  Location: [facility] ENDOSCOPY;  Service: Gastroenterology;;   TONSILLECTOMY AND ADENOIDECTOMY      Family History  Problem Relation Age of  Onset   Diabetes Father    Heart disease Father    Depression Sister    Alcohol abuse Sister    Anxiety disorder Sister    Bipolar disorder Sister    Pneumonia Sister    Breast cancer Maternal Aunt 17   Breast cancer Paternal Aunt 89   Hypertension Daughter    Breast cancer Daughter    Skin cancer Daughter    Heart attack Daughter    Anxiety disorder Daughter     Social History   Tobacco Use   Smoking status: Former    Current packs/day: 0.00    Average packs/day: 2.0 packs/day for 30.0 years (60.0 ttl pk-yrs)    Types: Cigarettes    Start date: 09/03/1978    Quit date: 09/03/2008    Years since quitting: 15.9   Smokeless tobacco: Never  Substance Use Topics   Alcohol use: No    Current Medications[1]  Allergies[2]  I personally reviewed active problem list, medication list, allergies, family history with the patient/caregiver today.   ROS  Ten systems reviewed and is negative except as mentioned in HPI    Objective Physical Exam CONSTITUTIONAL: Patient appears well-developed and well-nourished.  No distress. HEENT: Head atraumatic, normocephalic, neck supple. CARDIOVASCULAR: Normal rate, regular rhythm and normal heart sounds.  No murmur heard. No BLE edema. PULMONARY: Effort normal and breath sounds normal. No respiratory distress. ABDOMINAL: There is no tenderness or  distention. MUSCULOSKELETAL: Normal gait. Without gross motor or sensory deficit. PSYCHIATRIC: Patient has a normal mood and affect. behavior is normal. Judgment and thought content normal.  Vitals:   08/01/24 0936  BP: 110/70  Pulse: 87  Resp: 16  SpO2: 92%  Weight: 148 lb 6.4 oz (67.3 kg)  Height: 4' 11 (1.499 m)    Body mass index is 29.97 kg/m.  Recent Results (from the past 2160 hours)  POCT glycosylated hemoglobin (Hb A1C)     Status: Abnormal   Collection Time: 08/01/24  9:40 AM  Result Value Ref Range   Hemoglobin A1C 5.7 (A) 4.0 - 5.6 %   HbA1c POC (<> result, manual entry)      HbA1c, POC (prediabetic range)     HbA1c, POC (controlled diabetic range)      Diabetic Foot Exam:     PHQ2/9:    08/01/2024    9:30 AM 07/26/2024    9:38 AM 03/22/2024    8:54 AM 11/17/2023    9:25 AM 07/16/2023   10:13 AM  Depression screen PHQ 2/9  Decreased Interest 0 0 1 1 1   Down, Depressed, Hopeless 0 0 1 1 1   PHQ - 2 Score 0 0 2 2 2   Altered sleeping 0 3 1 1 1   Tired, decreased energy 0 0 1 1 1   Change in appetite 0 0 0 0 0  Feeling bad or failure about yourself  0 0 0 0 0  Trouble concentrating 0 0 1 1 1   Moving slowly or fidgety/restless 0 0 0 0 0  Suicidal thoughts 0 0 0 0 0  PHQ-9 Score 0 3 5  5  5    Difficult doing work/chores Not difficult at all Not difficult at all Somewhat difficult Somewhat difficult Somewhat difficult     Data saved with a previous flowsheet row definition    phq 9 is negative  Fall Risk:    08/01/2024    9:30 AM 07/26/2024    9:43 AM 04/20/2024   10:03 AM 03/22/2024    8:50 AM 11/17/2023    9:17 AM  Fall Risk   Falls in the past year? 0 0 0 0 0  Number falls in past yr: 0 0 0 0 0  Injury with Fall? 0 0 0  0  0   Risk for fall due to : No Fall Risks Medication side effect  No Fall Risks No Fall Risks  Follow up Falls evaluation completed Falls prevention discussed;Falls evaluation completed Falls evaluation completed Falls evaluation completed Falls prevention discussed;Education provided;Falls evaluation completed     Data saved with a previous flowsheet row definition     Assessment & Plan Asthma-COPD overlap syndrome with centrilobular emphysema Chronic cough likely related to asthma-COPD overlap syndrome. Increased inhaler use. Family history of lung cancer. Lung cancer screening due in June. - Referred to pulmonologist for medication adjustment and further evaluation. - Continue Ventolin  and Trelegy inhalers. - Recommended Mucinex  for mucus clearance.  Type 2 diabetes mellitus with dyslipidemia Type 2 diabetes  well-controlled with A1c at 5.7. Dyslipidemia managed with rosuvastatin . Increased urination possibly related to diabetes. - Continue rosuvastatin  20 mg. - Monitor A1c every six months. - Recommended eye exam for diabetic retinopathy.  Adult onset hypothyroidism Managed with levothyroxine . TSH levels stable. - Ordered thyroid  function tests. - Continue levothyroxine  75 mcg.  Age-related osteoporosis Managed with annual zoledronic  acid infusion. Bone density check due. - Continue annual zoledronic  acid infusion. - Will schedule bone density check.  Parkinson's disease without dyskinesia or fluctuating manifestations Parkinson's disease with tremors and balance issues. Managed with Sinemet and gabapentin . - Continue Sinemet and gabapentin .  Major depressive disorder, recurrent, moderate Managed with Wellbutrin  and Vraylar . Stressors include family issues. - Continue Wellbutrin , Duloxetine   and Vraylar .  Migraine without aura Migraines well-controlled with Maxalt . - Continue Maxalt  as needed.  Insomnia due to mental disorder Insomnia with difficulty staying asleep. Managed with trazodone   - Continue trazodone    Gastroesophageal reflux disease GERD managed with omeprazole . - Continue omeprazole .  B12 deficiency Managed with B12 supplementation. - Continue B12 supplementation.  General health maintenance Up to date with Medicare wellness exam and foot exam. Received flu shot but not COVID vaccine. - Continue routine health maintenance.        [1]  Current Outpatient Medications:    albuterol  (VENTOLIN  HFA) 108 (90 Base) MCG/ACT inhaler, INHALE 2 PUFFS INTO LUNGS EVERY 6 HOURS AS NEEDED FOR WHEEZING OR SHORTNESS OF BREATH, Disp: 18 each, Rfl: 0   Blood Glucose Monitoring Suppl DEVI, 1 each by Does not apply route in the morning, at noon, and at bedtime. May substitute to any manufacturer covered by patient's insurance., Disp: 1 each, Rfl: 0   celecoxib (CELEBREX) 200 MG  capsule, Take 200 mg by mouth., Disp: , Rfl:    Cholecalciferol (VITAMIN D  PO), Take 1,000 Units by mouth daily. 1000 IU, Disp: , Rfl:    clobetasol  ointment (TEMOVATE ) 0.05 %, Apply 1 Application topically 2 (two) times daily., Disp: 60 g, Rfl: 0   cyanocobalamin  (VITAMIN B12) 1000 MCG tablet, Take 1,000 mcg by mouth daily., Disp: , Rfl:    fluticasone  (FLONASE ) 50 MCG/ACT nasal spray, SPRAY 2 SPRAYS INTO EACH NOSTRIL EVERY DAY, Disp: 48 mL, Rfl: 0   gabapentin  (NEURONTIN ) 300 MG capsule, Take 300 mg by mouth at bedtime., Disp: , Rfl:    Glucose Blood (BLOOD GLUCOSE TEST STRIPS) STRP, 1 each by In Vitro route in the morning, at noon, and at bedtime. May substitute to any manufacturer covered by patient's insurance., Disp: 300 each, Rfl: 2   Lancet Device MISC, 1 each by Does not apply route in the morning, at noon, and at bedtime. May substitute to any manufacturer covered by patient's insurance., Disp: 3 each, Rfl: 2   Lancets Misc. (ACCU-CHEK FASTCLIX LANCET) KIT, , Disp: , Rfl:    levothyroxine  (SYNTHROID ) 75 MCG tablet, Take 1 tablet (75 mcg total) by mouth daily before breakfast., Disp: 90 tablet, Rfl: 1   linaclotide  (LINZESS ) 145 MCG CAPS capsule, TAKE 1 CAPSULE BY MOUTH DAILY BEFORE BREAKFAST., Disp: 30 capsule, Rfl: 3   Melatonin 10 MG TABS, Take 10 mg by mouth at bedtime., Disp: , Rfl:    rivastigmine (EXELON) 1.5 MG capsule, Take 1.5 mg by mouth 2 (two) times daily., Disp: , Rfl:    valACYclovir  (VALTREX ) 500 MG tablet, TAKE 1 TABLET (500 MG TOTAL) BY MOUTH DAILY., Disp: 90 tablet, Rfl: 1   buPROPion  (WELLBUTRIN  XL) 150 MG 24 hr tablet, TAKE 1 TABLET(150 MG) BY MOUTH DAILY, Disp: 90 tablet, Rfl: 1   carbidopa-levodopa (SINEMET IR) 25-100 MG tablet, Take 1.5 tablets by mouth 3 (three) times daily., Disp: , Rfl:    cariprazine  (VRAYLAR ) 1.5 MG capsule, Take 1 capsule (1.5 mg total) by mouth daily., Disp: 90 capsule, Rfl: 1   DULoxetine  (CYMBALTA ) 60 MG capsule, Take 1 capsule (60 mg total)  by mouth every morning., Disp: 90 capsule, Rfl: 1   Fluticasone -Umeclidin-Vilant (TRELEGY ELLIPTA ) 100-62.5-25 MCG/ACT AEPB, Inhale  1 puff into the lungs daily., Disp: 180 each, Rfl: 1   levocetirizine (XYZAL ) 5 MG tablet, Take 1 tablet (5 mg total) by mouth every evening., Disp: 90 tablet, Rfl: 1   montelukast  (SINGULAIR ) 10 MG tablet, Take 1 tablet (10 mg total) by mouth at bedtime., Disp: 90 tablet, Rfl: 1   omeprazole  (PRILOSEC) 20 MG capsule, Take 1 capsule (20 mg total) by mouth daily., Disp: 90 capsule, Rfl: 1   rizatriptan  (MAXALT ) 10 MG tablet, Take 1 tablet (10 mg total) by mouth daily as needed for migraine. No more than 2 in 48 hours, Disp: 9 tablet, Rfl: 0   rosuvastatin  (CRESTOR ) 20 MG tablet, TAKE 1 TABLET(20 MG) BY MOUTH DAILY, Disp: 90 tablet, Rfl: 1   traZODone  (DESYREL ) 100 MG tablet, TAKE 1 TABLET(100 MG) BY MOUTH AT BEDTIME, Disp: 90 tablet, Rfl: 1 [2] No Known Allergies

## 2024-08-02 ENCOUNTER — Ambulatory Visit: Payer: Self-pay | Admitting: Family Medicine

## 2024-08-02 LAB — COMPREHENSIVE METABOLIC PANEL WITH GFR
AG Ratio: 1.9 (calc) (ref 1.0–2.5)
ALT: 13 U/L (ref 6–29)
AST: 21 U/L (ref 10–35)
Albumin: 3.9 g/dL (ref 3.6–5.1)
Alkaline phosphatase (APISO): 62 U/L (ref 37–153)
BUN/Creatinine Ratio: 14 (calc) (ref 6–22)
BUN: 15 mg/dL (ref 7–25)
CO2: 25 mmol/L (ref 20–32)
Calcium: 8.9 mg/dL (ref 8.6–10.4)
Chloride: 105 mmol/L (ref 98–110)
Creat: 1.08 mg/dL — ABNORMAL HIGH (ref 0.60–1.00)
Globulin: 2.1 g/dL (ref 1.9–3.7)
Glucose, Bld: 78 mg/dL (ref 65–99)
Potassium: 4.4 mmol/L (ref 3.5–5.3)
Sodium: 138 mmol/L (ref 135–146)
Total Bilirubin: 0.7 mg/dL (ref 0.2–1.2)
Total Protein: 6 g/dL — ABNORMAL LOW (ref 6.1–8.1)
eGFR: 55 mL/min/1.73m2 — ABNORMAL LOW

## 2024-08-02 LAB — LIPID PANEL
Cholesterol: 134 mg/dL
HDL: 73 mg/dL
LDL Cholesterol (Calc): 47 mg/dL
Non-HDL Cholesterol (Calc): 61 mg/dL
Total CHOL/HDL Ratio: 1.8 (calc)
Triglycerides: 61 mg/dL

## 2024-08-02 LAB — CBC WITH DIFFERENTIAL/PLATELET
Absolute Lymphocytes: 1456 {cells}/uL (ref 850–3900)
Absolute Monocytes: 343 {cells}/uL (ref 200–950)
Basophils Absolute: 42 {cells}/uL (ref 0–200)
Basophils Relative: 0.8 %
Eosinophils Absolute: 21 {cells}/uL (ref 15–500)
Eosinophils Relative: 0.4 %
HCT: 40.6 % (ref 35.9–46.0)
Hemoglobin: 13 g/dL (ref 11.7–15.5)
MCH: 29.7 pg (ref 27.0–33.0)
MCHC: 32 g/dL (ref 31.6–35.4)
MCV: 92.7 fL (ref 81.4–101.7)
MPV: 9.8 fL (ref 7.5–12.5)
Monocytes Relative: 6.6 %
Neutro Abs: 3338 {cells}/uL (ref 1500–7800)
Neutrophils Relative %: 64.2 %
Platelets: 269 Thousand/uL (ref 140–400)
RBC: 4.38 Million/uL (ref 3.80–5.10)
RDW: 13.5 % (ref 11.0–15.0)
Total Lymphocyte: 28 %
WBC: 5.2 Thousand/uL (ref 3.8–10.8)

## 2024-08-02 LAB — B12 AND FOLATE PANEL
Folate: 10.9 ng/mL
Vitamin B-12: 448 pg/mL (ref 200–1100)

## 2024-08-02 LAB — VITAMIN D 25 HYDROXY (VIT D DEFICIENCY, FRACTURES): Vit D, 25-Hydroxy: 54 ng/mL (ref 30–100)

## 2024-08-02 LAB — TSH: TSH: 1.41 m[IU]/L (ref 0.40–4.50)

## 2024-08-10 ENCOUNTER — Ambulatory Visit
Admission: RE | Admit: 2024-08-10 | Discharge: 2024-08-10 | Disposition: A | Discharge status: Home / Self Care | Source: Ambulatory Visit | Attending: Family Medicine | Admitting: Family Medicine

## 2024-08-10 DIAGNOSIS — Z1231 Encounter for screening mammogram for malignant neoplasm of breast: Secondary | ICD-10-CM | POA: Insufficient documentation

## 2024-09-02 ENCOUNTER — Other Ambulatory Visit: Payer: Self-pay | Admitting: Family Medicine

## 2024-09-02 DIAGNOSIS — J441 Chronic obstructive pulmonary disease with (acute) exacerbation: Secondary | ICD-10-CM

## 2025-01-30 ENCOUNTER — Ambulatory Visit: Admitting: Family Medicine

## 2025-08-03 ENCOUNTER — Ambulatory Visit
